# Patient Record
Sex: Female | Born: 1970
Health system: Southern US, Community
[De-identification: ages and names within clinical notes are randomized; demographics above are authoritative.]

## PROBLEM LIST (undated history)

## (undated) DIAGNOSIS — Z8042 Family history of malignant neoplasm of prostate: Secondary | ICD-10-CM

## (undated) DIAGNOSIS — C50919 Malignant neoplasm of unspecified site of unspecified female breast: Secondary | ICD-10-CM

## (undated) DIAGNOSIS — R112 Nausea with vomiting, unspecified: Secondary | ICD-10-CM

## (undated) DIAGNOSIS — T7840XA Allergy, unspecified, initial encounter: Secondary | ICD-10-CM

## (undated) DIAGNOSIS — IMO0002 Reserved for concepts with insufficient information to code with codable children: Secondary | ICD-10-CM

## (undated) DIAGNOSIS — E785 Hyperlipidemia, unspecified: Secondary | ICD-10-CM

## (undated) DIAGNOSIS — Z9221 Personal history of antineoplastic chemotherapy: Secondary | ICD-10-CM

## (undated) DIAGNOSIS — N63 Unspecified lump in unspecified breast: Secondary | ICD-10-CM

## (undated) DIAGNOSIS — Z803 Family history of malignant neoplasm of breast: Secondary | ICD-10-CM

## (undated) DIAGNOSIS — Z923 Personal history of irradiation: Secondary | ICD-10-CM

## (undated) DIAGNOSIS — I10 Essential (primary) hypertension: Secondary | ICD-10-CM

## (undated) DIAGNOSIS — I890 Lymphedema, not elsewhere classified: Secondary | ICD-10-CM

## (undated) DIAGNOSIS — I8290 Acute embolism and thrombosis of unspecified vein: Secondary | ICD-10-CM

## (undated) DIAGNOSIS — H269 Unspecified cataract: Secondary | ICD-10-CM

## (undated) DIAGNOSIS — IMO0001 Reserved for inherently not codable concepts without codable children: Secondary | ICD-10-CM

## (undated) DIAGNOSIS — R110 Nausea: Secondary | ICD-10-CM

## (undated) HISTORY — DX: Family history of malignant neoplasm of prostate: Z80.42

## (undated) HISTORY — DX: Malignant neoplasm of unspecified site of unspecified female breast: C50.919

## (undated) HISTORY — DX: Nausea: R11.0

## (undated) HISTORY — DX: Personal history of antineoplastic chemotherapy: Z92.21

## (undated) HISTORY — DX: Unspecified lump in unspecified breast: N63.0

## (undated) HISTORY — DX: Hyperlipidemia, unspecified: E78.5

## (undated) HISTORY — DX: Family history of malignant neoplasm of breast: Z80.3

## (undated) HISTORY — PX: REDUCTION MAMMAPLASTY: SUR839

---

## 1999-12-31 HISTORY — PX: TUBAL LIGATION: SHX77

## 2000-01-31 HISTORY — PX: ABDOMINAL HYSTERECTOMY: SHX81

## 2006-10-28 ENCOUNTER — Observation Stay (HOSPITAL_COMMUNITY): Admission: EM | Admit: 2006-10-28 | Discharge: 2006-10-30 | Payer: Self-pay | Admitting: Emergency Medicine

## 2006-10-28 ENCOUNTER — Ambulatory Visit: Payer: Self-pay | Admitting: Family Medicine

## 2006-10-28 LAB — CONVERTED CEMR LAB
CO2: 17 meq/L
Chloride: 105 meq/L
Total Bilirubin: 1.4 mg/dL
Total Protein: 7 g/dL

## 2006-10-29 LAB — CONVERTED CEMR LAB
Cholesterol: 99 mg/dL
Total CHOL/HDL Ratio: 2.4
Triglycerides: 54 mg/dL

## 2006-11-15 ENCOUNTER — Encounter: Payer: Self-pay | Admitting: Family Medicine

## 2006-11-15 LAB — CONVERTED CEMR LAB: TSH: 0.87 microintl units/mL

## 2006-11-16 ENCOUNTER — Encounter: Payer: Self-pay | Admitting: Family Medicine

## 2006-11-16 ENCOUNTER — Ambulatory Visit: Payer: Self-pay | Admitting: Internal Medicine

## 2006-11-16 DIAGNOSIS — E785 Hyperlipidemia, unspecified: Secondary | ICD-10-CM | POA: Insufficient documentation

## 2006-11-16 DIAGNOSIS — E1065 Type 1 diabetes mellitus with hyperglycemia: Secondary | ICD-10-CM

## 2006-11-16 DIAGNOSIS — F329 Major depressive disorder, single episode, unspecified: Secondary | ICD-10-CM

## 2006-11-16 DIAGNOSIS — E108 Type 1 diabetes mellitus with unspecified complications: Secondary | ICD-10-CM

## 2006-11-16 LAB — CONVERTED CEMR LAB: Hgb A1c MFr Bld: 10.3 %

## 2007-01-21 ENCOUNTER — Ambulatory Visit: Payer: Self-pay | Admitting: Sports Medicine

## 2007-01-21 ENCOUNTER — Encounter: Admission: RE | Admit: 2007-01-21 | Discharge: 2007-01-21 | Payer: Self-pay | Admitting: Family Medicine

## 2007-01-22 ENCOUNTER — Telehealth: Payer: Self-pay | Admitting: *Deleted

## 2007-02-13 ENCOUNTER — Encounter: Admission: RE | Admit: 2007-02-13 | Discharge: 2007-04-02 | Payer: Self-pay | Admitting: Family Medicine

## 2007-02-14 ENCOUNTER — Ambulatory Visit: Payer: Self-pay | Admitting: Family Medicine

## 2007-06-03 ENCOUNTER — Encounter: Payer: Self-pay | Admitting: Family Medicine

## 2007-06-03 LAB — CONVERTED CEMR LAB: Hgb A1c MFr Bld: 10.4 %

## 2007-06-14 ENCOUNTER — Encounter: Payer: Self-pay | Admitting: Family Medicine

## 2007-06-14 ENCOUNTER — Ambulatory Visit: Payer: Self-pay | Admitting: Family Medicine

## 2007-06-14 DIAGNOSIS — N951 Menopausal and female climacteric states: Secondary | ICD-10-CM

## 2007-06-14 LAB — CONVERTED CEMR LAB
FSH: 3.6 milliintl units/mL
TSH: 0.87 microintl units/mL (ref 0.350–5.50)

## 2007-06-17 ENCOUNTER — Encounter: Payer: Self-pay | Admitting: Family Medicine

## 2007-06-19 ENCOUNTER — Encounter: Payer: Self-pay | Admitting: Family Medicine

## 2007-07-02 ENCOUNTER — Encounter: Payer: Self-pay | Admitting: Family Medicine

## 2007-08-30 ENCOUNTER — Encounter: Payer: Self-pay | Admitting: Family Medicine

## 2007-08-30 LAB — CONVERTED CEMR LAB
ALT: 55 units/L
Free T4: 96 ng/dL
Microalb, Ur: 9 mg/dL
TSH: 2.43 microintl units/mL

## 2007-10-03 ENCOUNTER — Encounter: Payer: Self-pay | Admitting: Family Medicine

## 2007-11-27 ENCOUNTER — Ambulatory Visit: Payer: Self-pay | Admitting: Family Medicine

## 2007-12-10 ENCOUNTER — Encounter: Admission: RE | Admit: 2007-12-10 | Discharge: 2007-12-10 | Payer: Self-pay | Admitting: Family Medicine

## 2007-12-16 ENCOUNTER — Inpatient Hospital Stay (HOSPITAL_COMMUNITY): Admission: EM | Admit: 2007-12-16 | Discharge: 2007-12-18 | Payer: Self-pay | Admitting: Emergency Medicine

## 2007-12-16 ENCOUNTER — Ambulatory Visit: Payer: Self-pay | Admitting: Cardiovascular Disease

## 2007-12-16 ENCOUNTER — Encounter: Payer: Self-pay | Admitting: Family Medicine

## 2007-12-16 ENCOUNTER — Ambulatory Visit: Payer: Self-pay | Admitting: Family Medicine

## 2007-12-17 ENCOUNTER — Encounter: Payer: Self-pay | Admitting: Family Medicine

## 2007-12-17 LAB — CONVERTED CEMR LAB: Hgb A1c MFr Bld: 9 %

## 2007-12-24 ENCOUNTER — Ambulatory Visit: Payer: Self-pay | Admitting: Family Medicine

## 2007-12-24 ENCOUNTER — Encounter (INDEPENDENT_AMBULATORY_CARE_PROVIDER_SITE_OTHER): Payer: Self-pay | Admitting: *Deleted

## 2007-12-24 LAB — CONVERTED CEMR LAB: Pap Smear: 0

## 2008-01-10 ENCOUNTER — Encounter: Payer: Self-pay | Admitting: Family Medicine

## 2008-05-01 ENCOUNTER — Ambulatory Visit: Payer: Self-pay | Admitting: Family Medicine

## 2008-05-01 ENCOUNTER — Encounter: Payer: Self-pay | Admitting: Family Medicine

## 2008-05-01 LAB — CONVERTED CEMR LAB
ALT: 25 units/L (ref 0–35)
AST: 23 units/L (ref 0–37)
Alkaline Phosphatase: 75 units/L (ref 39–117)
Hgb A1c MFr Bld: 9.2 %
LDL Cholesterol: 92 mg/dL (ref 0–99)
Sodium: 141 meq/L (ref 135–145)
Total Bilirubin: 0.5 mg/dL (ref 0.3–1.2)
Total Protein: 7.3 g/dL (ref 6.0–8.3)
VLDL: 12 mg/dL (ref 0–40)

## 2008-05-21 ENCOUNTER — Telehealth: Payer: Self-pay | Admitting: Family Medicine

## 2008-05-29 ENCOUNTER — Encounter: Payer: Self-pay | Admitting: *Deleted

## 2008-07-24 ENCOUNTER — Telehealth: Payer: Self-pay | Admitting: Family Medicine

## 2008-07-27 ENCOUNTER — Ambulatory Visit: Payer: Self-pay | Admitting: Family Medicine

## 2008-07-27 LAB — CONVERTED CEMR LAB
Bilirubin Urine: NEGATIVE
Ketones, urine, test strip: NEGATIVE
Specific Gravity, Urine: 1.015

## 2008-07-28 ENCOUNTER — Other Ambulatory Visit: Admission: RE | Admit: 2008-07-28 | Discharge: 2008-07-28 | Payer: Self-pay | Admitting: Obstetrics and Gynecology

## 2008-07-28 ENCOUNTER — Ambulatory Visit: Payer: Self-pay | Admitting: Obstetrics and Gynecology

## 2008-07-28 ENCOUNTER — Encounter: Payer: Self-pay | Admitting: Obstetrics and Gynecology

## 2008-12-11 ENCOUNTER — Encounter: Admission: RE | Admit: 2008-12-11 | Discharge: 2008-12-11 | Payer: Self-pay | Admitting: Family Medicine

## 2008-12-22 ENCOUNTER — Ambulatory Visit: Payer: Self-pay | Admitting: Family Medicine

## 2008-12-22 LAB — CONVERTED CEMR LAB: Hgb A1c MFr Bld: 8.3 %

## 2009-01-30 HISTORY — PX: OTHER SURGICAL HISTORY: SHX169

## 2009-02-11 ENCOUNTER — Ambulatory Visit (HOSPITAL_BASED_OUTPATIENT_CLINIC_OR_DEPARTMENT_OTHER): Admission: RE | Admit: 2009-02-11 | Discharge: 2009-02-11 | Payer: Self-pay | Admitting: Urology

## 2009-04-30 ENCOUNTER — Telehealth: Payer: Self-pay | Admitting: Family Medicine

## 2009-04-30 ENCOUNTER — Encounter: Payer: Self-pay | Admitting: Family Medicine

## 2009-04-30 ENCOUNTER — Ambulatory Visit: Payer: Self-pay | Admitting: Family Medicine

## 2009-05-03 ENCOUNTER — Encounter: Payer: Self-pay | Admitting: Family Medicine

## 2009-05-03 LAB — CONVERTED CEMR LAB
Albumin: 4.3 g/dL (ref 3.5–5.2)
Alkaline Phosphatase: 78 units/L (ref 39–117)
BUN: 13 mg/dL (ref 6–23)
CO2: 25 meq/L (ref 19–32)
Calcium: 9.6 mg/dL (ref 8.4–10.5)
Glucose, Bld: 53 mg/dL — ABNORMAL LOW (ref 70–99)
Hemoglobin: 13.1 g/dL (ref 12.0–15.0)
Lipase: <10 units/L (ref 0–75)
MCHC: 34.3 g/dL (ref 30.0–36.0)
Potassium: 4.1 meq/L (ref 3.5–5.3)
RBC: 4.45 M/uL (ref 3.87–5.11)
Total Protein: 7.2 g/dL (ref 6.0–8.3)
WBC: 6.7 10*3/uL (ref 4.0–10.5)

## 2009-06-22 ENCOUNTER — Ambulatory Visit: Payer: Self-pay | Admitting: Family Medicine

## 2009-07-12 ENCOUNTER — Encounter: Payer: Self-pay | Admitting: Family Medicine

## 2009-11-09 ENCOUNTER — Ambulatory Visit: Payer: Self-pay | Admitting: Family Medicine

## 2009-11-09 DIAGNOSIS — M719 Bursopathy, unspecified: Secondary | ICD-10-CM

## 2009-11-09 DIAGNOSIS — M67919 Unspecified disorder of synovium and tendon, unspecified shoulder: Secondary | ICD-10-CM | POA: Insufficient documentation

## 2009-11-12 ENCOUNTER — Telehealth: Payer: Self-pay | Admitting: Family Medicine

## 2009-11-15 ENCOUNTER — Ambulatory Visit: Payer: Self-pay | Admitting: Family Medicine

## 2009-11-15 ENCOUNTER — Encounter: Admission: RE | Admit: 2009-11-15 | Discharge: 2009-11-15 | Payer: Self-pay | Admitting: Family Medicine

## 2009-12-03 ENCOUNTER — Encounter: Payer: Self-pay | Admitting: Family Medicine

## 2009-12-03 ENCOUNTER — Ambulatory Visit: Payer: Self-pay | Admitting: Family Medicine

## 2009-12-03 LAB — CONVERTED CEMR LAB: Hgb A1c MFr Bld: 8.2 %

## 2009-12-08 ENCOUNTER — Encounter
Admission: RE | Admit: 2009-12-08 | Discharge: 2010-01-25 | Payer: Self-pay | Source: Home / Self Care | Attending: Family Medicine | Admitting: Family Medicine

## 2009-12-13 ENCOUNTER — Encounter: Admission: RE | Admit: 2009-12-13 | Discharge: 2009-12-13 | Payer: Self-pay

## 2009-12-15 ENCOUNTER — Encounter: Payer: Self-pay | Admitting: Family Medicine

## 2009-12-15 ENCOUNTER — Ambulatory Visit: Payer: Self-pay | Admitting: Family Medicine

## 2009-12-15 DIAGNOSIS — N63 Unspecified lump in unspecified breast: Secondary | ICD-10-CM | POA: Insufficient documentation

## 2009-12-15 LAB — CONVERTED CEMR LAB: Direct LDL: 92 mg/dL

## 2009-12-16 ENCOUNTER — Encounter: Payer: Self-pay | Admitting: Family Medicine

## 2009-12-27 ENCOUNTER — Encounter: Admission: RE | Admit: 2009-12-27 | Discharge: 2009-12-27 | Payer: Self-pay

## 2009-12-28 ENCOUNTER — Encounter: Admission: RE | Admit: 2009-12-28 | Discharge: 2009-12-28 | Payer: Self-pay

## 2009-12-31 ENCOUNTER — Encounter: Payer: Self-pay | Admitting: Family Medicine

## 2009-12-31 ENCOUNTER — Ambulatory Visit: Payer: Self-pay | Admitting: Family Medicine

## 2010-02-01 ENCOUNTER — Encounter
Admission: RE | Admit: 2010-02-01 | Discharge: 2010-02-16 | Payer: Self-pay | Source: Home / Self Care | Attending: Family Medicine | Admitting: Family Medicine

## 2010-02-15 ENCOUNTER — Ambulatory Visit: Admission: RE | Admit: 2010-02-15 | Discharge: 2010-02-15 | Payer: Self-pay | Source: Home / Self Care

## 2010-02-15 DIAGNOSIS — M999 Biomechanical lesion, unspecified: Secondary | ICD-10-CM | POA: Insufficient documentation

## 2010-02-15 DIAGNOSIS — R29818 Other symptoms and signs involving the nervous system: Secondary | ICD-10-CM | POA: Insufficient documentation

## 2010-02-16 ENCOUNTER — Encounter: Admit: 2010-02-16 | Payer: Self-pay | Admitting: Family Medicine

## 2010-02-18 ENCOUNTER — Encounter: Payer: Self-pay | Admitting: Family Medicine

## 2010-02-22 ENCOUNTER — Encounter: Payer: Self-pay | Admitting: Family Medicine

## 2010-03-01 NOTE — Miscellaneous (Signed)
Summary: Consent: shoulder injection  Consent: shoulder injection   Imported By: Knox Royalty 12/15/2009 10:22:24  _____________________________________________________________________  External Attachment:    Type:   Image     Comment:   External Document

## 2010-03-01 NOTE — Consult Note (Signed)
Summary: Lucianne [name] MD  Lucianne [name] MD   Imported By: Bradly Bienenstock 05/04/2009 10:02:56  _____________________________________________________________________  External Attachment:    Type:   Image     Comment:   External Document

## 2010-03-01 NOTE — Assessment & Plan Note (Signed)
Summary: FU/KH   Vital Signs:  Patient profile:   40 year old female Height:      65 inches Weight:      177.8 pounds BMI:     29.69 Temp:     98.3 degrees F oral Pulse rate:   76 / minute BP sitting:   114 / 71  (right arm) Cuff size:   regular  Vitals Entered By: Garen Grams LPN (Jun 22, 2009 8:36 AM) CC: f/u nausea Is Patient Diabetic? Yes Did you bring your meter with you today? No Pain Assessment Patient in pain? no        Primary Care Provider:  [name]  MD  CC:  f/u nausea.  History of Present Illness: 1. nausea States this is improved. Had a "couple" episodes last week that have been difficult. Hasn't actually had any emesis but feeling very nauseated. Has been taking her sugars at the time of her nausea but hasn't found a correlation between the level of blood glucose and symptoms. Hasn't noted any correlation with eating. Feels fine today.   Thinks she had a normal gastric emptying study around 2003.    2. Diabetes taking medications: yes -- insulin pump followed by Dr. Lucianne [name].  problems with medications?: no blood sugar testing frequency: per symptoms and with meals otherwise.  hypoglycemic events?: occasionally -- down to 40 on Sunday, very sporadic. Sometimes has episodes once a day or several times a day, sometimes will go months without feeling it.  subjective:  ROS chest pain: no   shortness of breath: no   polyuria:  no   polydipsia: no    problems with feet: no     Habits & Providers  Alcohol-Tobacco-Diet     Tobacco Status: never  Current Medications (verified): 1)  Insulin Pump - Novolog .... Per Instructions 2)  Lipitor 40 Mg  Tabs (Atorvastatin Calcium) .... By Mouth Daily 3)  Lisinopril 10 Mg Tabs (Lisinopril) .... One By Mouth Daily For Blood Puressure 4)  Ibuprofen 800 Mg Tabs (Ibuprofen) .... One By Mouth Three Times A Day For Back Pain 5)  Neurontin 300 Mg Caps (Gabapentin) .... Take Once A Day On Day 1, Twice A Day On Day 2,  and Three Times A Day Thereafter 6)  Metformin Hcl 1000 Mg Tabs (Metformin Hcl) .... One By Mouth Two Times A Day 7)  Triamcinolone Acetonide 0.1 % Crea (Triamcinolone Acetonide) .... Apply To Affected Area Two Times A Day; Disp 30 G Tube 8)  Ondansetron 8 Mg Tbdp (Ondansetron) .... One By Mouth Every 8 Hours As Needed For Nausea/vomiting  Allergies (verified): 1)  ! * Lantus 2)  ! Lortab 10 (Hydrocodone-Acetaminophen) 3)  ! * Nph  Social History: Pt recently married (9.08) and relocated from [place]. Is on disability 2/2 to DM.  Has Medicare. No smoking, drugs, or etoh. Has 3 children aged 77, 22, and 6. Lives with these children and her husband.  very involved in her church  Review of Systems       review of systems as noted in HPI section   Physical Exam  General:  vital signs reviewed and normal Alert, appropriate; well-dressed and well-nourished  Lungs:  work of breathing unlabored, clear to auscultation bilaterally; no wheezes, rales, or ronchi; good air movement throughout  Heart:  regular rate and rhythm, no murmurs; normal s1/s2   Diabetes Management Exam:    Foot Exam (with socks and/or shoes not present):       Sensory-Pinprick/Light  touch:          Left medial foot (L-4): normal          Left dorsal foot (L-5): normal          Left lateral foot (S-1): normal          Right medial foot (L-4): normal          Right dorsal foot (L-5): normal          Right lateral foot (S-1): normal       Sensory-Monofilament:          Left foot: normal          Right foot: normal       Inspection:          Left foot: abnormal             Comments: non-tender calluses on PIP joint of toes R > L           Right foot: abnormal             Comments: non-tender calluses on PIP joint of toes R > L        Nails:          Left foot: normal          Right foot: normal   Impression & Recommendations:  Problem # 1:  NAUSEA AND VOMITING (ICD-787.01) Assessment Improved  will resend  zofran at lower dose; phenergan is too sedating for the patient. Will likely have to fill out PA. Unsure of etiology. Possibly early stages of diabetic gastroparesis but given lack of actual vomiting will defer further w/u at this time. At last visit CBC, CMP, lipase all normal.   Orders: [facility]- Est Level  3 (99213)  Problem # 2:  DM, UNCOMPLICATED, TYPE I, UNCONTROLLED (ICD-250.03) Assessment: Unchanged  brittle, followed by Dr. Lucianne [name] on insulin pump. Last A1c was 8.5 her in the office. Monofilament testing normal today.  Her updated medication list for this problem includes:    Lisinopril 10 Mg Tabs (Lisinopril) ..... One by mouth daily for blood puressure    Metformin Hcl 1000 Mg Tabs (Metformin hcl) ..... One by mouth two times a day  Orders: [facility]- Est Level  3 (16109)  Complete Medication List: 1)  Insulin Pump - Novolog  .... Per instructions 2)  Lipitor 40 Mg Tabs (Atorvastatin calcium) .... By mouth daily 3)  Lisinopril 10 Mg Tabs (Lisinopril) .... One by mouth daily for blood puressure 4)  Ibuprofen 800 Mg Tabs (Ibuprofen) .... One by mouth three times a day for back pain 5)  Neurontin 300 Mg Caps (Gabapentin) .... Take once a day on day 1, twice a day on day 2, and three times a day thereafter 6)  Metformin Hcl 1000 Mg Tabs (Metformin hcl) .... One by mouth two times a day 7)  Triamcinolone Acetonide 0.1 % Crea (Triamcinolone acetonide) .... Apply to affected area two times a day; disp 30 g tube 8)  Ondansetron 8 Mg Tbdp (Ondansetron) .... One by mouth every 8 hours as needed for nausea/vomiting  Patient Instructions: 1)  I'll send in the zofran (ondansetron).  2)  follow-up in 2-3 months 3)  we may need to repeat the gastric emptying at some point in the future.    Prevention & Chronic Care Immunizations   Influenza vaccine: Not documented    Tetanus booster: 12/24/2007: Tdap    Pneumococcal vaccine: Not documented  Other Screening   Pap smear: Marland Kitchen  (  12/24/2007)   Pap  smear due: Not Indicated    Mammogram: ASSESSMENT: Negative - BI-RADS 1^MM DIGITAL SCREENING  (12/11/2008)   Mammogram due: 12/11/2009   Smoking status: never  (06/22/2009)  Diabetes Mellitus   HgbA1C: 8.5  (04/30/2009)   Hemoglobin A1C due: 02/27/2008    Eye exam: Not documented    Foot exam: yes  (06/22/2009)   High risk foot: Not documented   Foot care education: completed  (11/27/2007)   Foot exam due: 11/26/2008    Urine microalbumin/creatinine ratio: 5  (08/30/2007)   Urine microalbumin/cr due: 08/29/2008    Diabetes flowsheet reviewed?: Yes   Progress toward A1C goal: Unchanged  Lipids   Total Cholesterol: 178  (05/01/2008)   LDL: 92  (05/01/2008)   LDL Direct: Not documented   HDL: 74  (05/01/2008)   Triglycerides: 62  (05/01/2008)    SGOT (AST): 19  (04/30/2009)   SGPT (ALT): 25  (04/30/2009)   Alkaline phosphatase: 78  (04/30/2009)   Total bilirubin: 0.6  (04/30/2009)    Lipid flowsheet reviewed?: Yes   Progress toward LDL goal: Unchanged  Self-Management Support :   Personal Goals (by the next clinic visit) :     Personal A1C goal: 8  (12/22/2008)     Personal blood pressure goal: 130/80  (12/22/2008)     Personal LDL goal: 100  (04/30/2009)    Diabetes self-management support: Not documented    Diabetes self-management support not done because: Good outcomes  (12/22/2008)   Last diabetes self-management training by diabetes educator: completed    Lipid self-management support: Not documented     Lipid self-management support not done because: Not indicated  (04/30/2009)

## 2010-03-01 NOTE — Assessment & Plan Note (Signed)
Summary: shoulder pain,tcb   Vital Signs:  Patient profile:   40 year old female Height:      65 inches Weight:      181.2 pounds BMI:     30.26 Pulse rate:   73 / minute BP sitting:   129 / 90  (left arm) Cuff size:   regular  Vitals Entered By: Garen Grams LPN (November 09, 2009 2:24 PM) CC: left shoulder pain Is Patient Diabetic? Yes Did you bring your meter with you today? No Pain Assessment Patient in pain? yes     Location: shoulder Intensity: 9   Primary Care Provider:  Priscella Mann MD  CC:  left shoulder pain.  History of Present Illness: pain started 3 months ago on left [place]oulder.  no change in exercise, no injury.  this is the side she sleeps on.  pt has some diabetic neuropathy but no new tingling in fingers.  no weakness, just hurts to move it. pain is all over, no specific place.  Habits & Providers  Alcohol-Tobacco-Diet     Tobacco Status: never  Current Medications (verified): 1)  Insulin Pump - Novolog .... Per Instructions 2)  Lipitor 40 Mg  Tabs (Atorvastatin Calcium) .... By Mouth Daily 3)  Lisinopril 10 Mg Tabs (Lisinopril) .... One By Mouth Daily For Blood Puressure 4)  Ibuprofen 800 Mg Tabs (Ibuprofen) .... One By Mouth Three Times A Day For Shoulder Pain 5)  Neurontin 300 Mg Caps (Gabapentin) .... Take Once A Day On Day 1, Twice A Day On Day 2, and Three Times A Day Thereafter 6)  Metformin Hcl 1000 Mg Tabs (Metformin Hcl) .... One By Mouth Two Times A Day 7)  Triamcinolone Acetonide 0.1 % Crea (Triamcinolone Acetonide) .... Apply To Affected Area Two Times A Day; Disp 30 G Tube 8)  Ondansetron Hcl 4 Mg Tabs (Ondansetron Hcl) .... One By Mouth Every 6 Hours As Needed 9)  Tramadol Hcl 50 Mg Tabs (Tramadol Hcl) .... Take One Every 6 Hours As Needed For Pain  Allergies (verified): 1)  ! * Lantus 2)  ! Lortab 10 (Hydrocodone-Acetaminophen) 3)  ! * Nph  Review of Systems  The patient denies anorexia, fever, and weight loss.    Physical  Exam  General:  VS reivewed.  NAD   Shoulder/Elbow Exam  General:    Well-developed, well-nourished, normal body habitus; no deformities, normal grooming.    Skin:    Intact, no scars, lesions, rashes, cafe au lait spots or bruising.    Inspection:    Inspection is normal.    Palpation:    Non-tender to palpation left shoulder.  no tenderness over spine.     Shoulder Exam:    Right:    Stability:  stable    Tenderness:  no    Swelling:  no    Erythema:  no    Inspection/Palpation:  pt with limited active ROM but normal passive ROM.     Impression & Recommendations:  Problem # 1:  SHOULDER PAIN, LEFT (ICD-719.41) Assessment New no injury, no point tenderness.  start with NSAIDS and exercises.  If not better, inject and send to PT.  emphasized that she needs to be moving and exercising to avoid frozen shoulder.  pt to RTC in 2-3 weeks Her updated medication list for this problem includes:    Ibuprofen 800 Mg Tabs (Ibuprofen) ..... One by mouth three times a day for shoulder pain    Tramadol Hcl 50 Mg Tabs (Tramadol  hcl) ..... Take one every 6 hours as needed for pain  Orders: [facility]- Est Level  3 (16109)  Complete Medication List: 1)  Insulin Pump - Novolog  .... Per instructions 2)  Lipitor 40 Mg Tabs (Atorvastatin calcium) .... By mouth daily 3)  Lisinopril 10 Mg Tabs (Lisinopril) .... One by mouth daily for blood puressure 4)  Ibuprofen 800 Mg Tabs (Ibuprofen) .... One by mouth three times a day for shoulder pain 5)  Neurontin 300 Mg Caps (Gabapentin) .... Take once a day on day 1, twice a day on day 2, and three times a day thereafter 6)  Metformin Hcl 1000 Mg Tabs (Metformin hcl) .... One by mouth two times a day 7)  Triamcinolone Acetonide 0.1 % Crea (Triamcinolone acetonide) .... Apply to affected area two times a day; disp 30 g tube 8)  Ondansetron Hcl 4 Mg Tabs (Ondansetron hcl) .... One by mouth every 6 hours as needed 9)  Tramadol Hcl 50 Mg Tabs (Tramadol hcl)  .... Take one every 6 hours as needed for pain  Patient Instructions: 1)  Come back and see me in 2-3 weeks 2)  your kidney function looks fine the last time it was checked 3)  Take 800mg  of motrin every 8 hours and take tramadol as well. 4)  1 hour after you take the pain medication, try the exercises I gave you. 5)  If this doesn't work, we will try a shoulder injection and physical therapy Prescriptions: TRAMADOL HCL 50 MG TABS (TRAMADOL HCL) take one every 6 hours as needed for pain  #45 x 1   Entered and Authorized by:   Ellery Plunk MD   Signed by:   Ellery Plunk MD on 11/09/2009   Method used:   Electronically to        [facility]  [id number]* (retail)       [address]       [place], [place]  [address]       Ph: [phone] or [phone]       Fax: [phone]   RxID:   [id number] IBUPROFEN 800 MG TABS (IBUPROFEN) one by mouth three times a day for shoulder pain  #45 x 1   Entered and Authorized by:   Ellery Plunk MD   Signed by:   Ellery Plunk MD on 11/09/2009   Method used:   Electronically to        [facility]  [id number]* (retail)       [address]       [place], [place]  [address]       Ph: [phone] or [phone]       Fax: [phone]   RxID:   [id number]

## 2010-03-01 NOTE — Progress Notes (Signed)
Summary: phn msg  Phone Note Call from Patient Call back at [facility] Phone [phone]   Caller: Patient Summary of Call: Patient unable to take Tramadol.  It makes her regurgitate.  She has stopped taking it for now.  Would like another pain rx.  Please send to [facility] on [address] Initial call taken by: Abundio Miu,  November 12, 2009 2:40 PM  Follow-up for Phone Call        pt should continue with NSAID and can alternate with tylenol.  continue exercises until appt with Dr. Madolyn Frieze Follow-up by: Ellery Plunk MD,  November 14, 2009 8:24 PM  Additional Follow-up for Phone Call Additional follow up Details #1::        pt is still having problems and needs to talk to nurse Additional Follow-up by: De Nurse,  November 15, 2009 11:27 AM    Additional Follow-up for Phone Call Additional follow up Details #2::    tramadol makes her vomit. ibu eases the  pain "some" states the shoulder pain keeps her up at night. cannot wait until 12/03/09 appt. will see Dr. Deirdre Priest this pm as he is on pcp team Follow-up by: Golden Circle RN,  November 15, 2009 11:30 AM

## 2010-03-01 NOTE — Assessment & Plan Note (Signed)
Summary: f/u rotator cuff tendonitis, HLD, mammogram results   Vital Signs:  Patient profile:   40 year old female Height:      65 inches Weight:      176.4 pounds BMI:     29.46 Temp:     98.4 degrees F oral Pulse rate:   91 / minute BP sitting:   131 / 82  (left arm) Cuff size:   regular  Vitals Entered By: Garen Grams LPN (December 31, 2009 1:37 PM) CC: f/u bp, cholesterol Is Patient Diabetic? Yes Did you bring your meter with you today? No Pain Assessment Patient in pain? yes     Location: shoulder   Primary Provider:  Priscella Mann MD  CC:  f/u bp and cholesterol.  History of Present Illness: 1. Rotator cuff tendonitis -still painful despite steroid injection  -started attending PT 11/09. Will go until 01/19/2010. Going twice a week and doing exercises at home. Finds it helpful depending on therapist. -sometimes takes up to 3 ibuprofen daily for pain; tramadol makes her vomit & sulindac causes indigestion -pt having difficulty taking off jacket in room  2. High cholesterol LDL 92 on 12/15/2009.  No longer taking statin. Endocrinologist told her she didn't need to take it any more. Patient will rather not take medicine because she has tendency to forget.  3. Breast screening Went over results of screening & ultrasound showing benign fibrocystic changes.    Preventive Screening-Counseling & Management  Alcohol-Tobacco     Smoking Status: never  Allergies: 1)  ! * Lantus 2)  ! Lortab 10 (Hydrocodone-Acetaminophen) 3)  ! * Nph  Past History:  Past Medical History: Type 1 DM dx'd in 2001. Hyperlipidemia - controlled. Cardiac Cath - 11/09 - no disease, normal LV function. R sciatica (12/2006) R rotator cuff injury, mild (05/2007) Bladder prolapse with urinary incontinence (06/2008) Shingles, mild (11/2008) L shoulder tendonitis s/p steroid injection (11/2008); PT 11/09-12/21. L breast mass found on screening mammogram (12/27/2009) 11 mm in size mass  located within the left breast at the o'clock position 8 cm from nipple, Bx benign fibrocystic changes.   h/o urinary incontinence & bladder prolapse  Physical Exam  General:  pleasant, NAD, difficulty removing jacket during interview due to shoulder pain Msk:  R shoulder: full flexion, 40 deg extension, full abduction, no pain with external or internal rotation L shoulder: full flexion, 40 deg extension, 100 deg abduction, moderate pain with external rotation & mild pain with internal rotation  Skin shows no erythema, bony abnormalities   Impression & Recommendations:  Problem # 1:  ROTATOR CUFF INJURY, LEFT SHOULDER (ICD-726.10) Assessment Improved  Commended and encouraged patient for attending PT. Will continue to go until end of this month.  Discussed with patient that this type of injury takes months, not weeks, to get better and that I am encouraged that it is improving based on improved ROM. Patient asked about further imaging. Told her not warranted at this time. May consider imaging if no improvement after PT or pain persists 6-9 months from onset. Would not consider another steroid injection at this time; may consider offering local NSAID patch in the future. Patient may use ibuprofen conservatively for pain. Cr is good. But patient aware she needs to be cautious of kidney injury.   Orders: [facility]- Est  Level 4 (10272)  Problem # 2:  HYPERLIPIDEMIA (ICD-272.4) Assessment: Unchanged  LDL 92. Patient would prefer not take statin at this time. Discussed goal is 67 based  on some standards. Am okay with holding off on statin since at least below 100.   The following medications were removed from the medication list:    Lipitor 40 Mg Tabs (Atorvastatin calcium) ..... By mouth daily  Orders: [facility]- Est  Level 4 (07371)  Problem # 3:  DM, UNCOMPLICATED, TYPE I, UNCONTROLLED (ICD-250.03) Patient reports getting eye exam done within past year. Will get records from [facility].   Her  updated medication list for this problem includes:    Lisinopril 10 Mg Tabs (Lisinopril) ..... One by mouth daily for blood puressure    Metformin Hcl 1000 Mg Tabs (Metformin hcl) ..... One by mouth two times a day  Problem # 4:  BREAST MASS, LEFT (ICD-611.72)  Discussed findings. Reassured patient benign.   Orders: [facility]- Est  Level 4 (06269)  Complete Medication List: 1)  Insulin Pump - Novolog  .... Per instructions 2)  Lisinopril 10 Mg Tabs (Lisinopril) .... One by mouth daily for blood puressure 3)  Neurontin 300 Mg Caps (Gabapentin) .... Take once a day on day 1, twice a day on day 2, and three times a day thereafter 4)  Metformin Hcl 1000 Mg Tabs (Metformin hcl) .... One by mouth two times a day 5)  Triamcinolone Acetonide 0.1 % Crea (Triamcinolone acetonide) .... Apply to affected area two times a day; disp 30 g tube 6)  Ondansetron Hcl 4 Mg Tabs (Ondansetron hcl) .... One by mouth every 6 hours as needed  Patient Instructions: 1)  No need for follow-up of shoulder. Please schedule an appointment if shoulder worsening despite physical therapy.   Orders Added: 1)  [facility]- Est  Level 4 [48546]

## 2010-03-01 NOTE — Miscellaneous (Signed)
Summary: ROI  ROI   Imported By: De Nurse 01/05/2010 16:23:19  _____________________________________________________________________  External Attachment:    Type:   Image     Comment:   External Document

## 2010-03-01 NOTE — Miscellaneous (Signed)
Summary: Labs from [facility] 11/25/2009  CMET normal A1c 8.5 Fructosamine 418 (normal 200-272) U/A, microscopic, microalbumin normal

## 2010-03-01 NOTE — Assessment & Plan Note (Signed)
Summary: OV L shoulder tendonitis   Vital Signs:  Patient profile:   40 year old female Weight:      180 pounds Temp:     98.5 degrees F oral Pulse rate:   77 / minute Pulse rhythm:   regular BP sitting:   151 / 77  (left arm) Cuff size:   regular  Vitals Entered By: Loralee Pacas CMA (December 03, 2009 3:31 PM) CC: left shoulder pai Is Patient Diabetic? Yes Did you bring your meter with you today? No   Primary Provider:  Priscella Mann MD  CC:  left shoulder pai.  History of Present Illness: 1. Persistent L shoulder pain, likely tendonitis. Today is third visit for this problem over past several weeks. Sulindac prescribed at last visit helps but causes GI discomfort. Ibuprofen does not help as much. Pain is 7/10 at worst. Pain is worst with using L shoulder. Unsure of why shoulder started hurting initially (denies trauma or other clear cause).  Pt has h/o R shoulder pain 2/2 previous job constantly lifting things. This pain is 2/10 now and has never been as bad as current L shoulder pain.   2. DM On insulin pump, followed by Dr. Lucianne [name]. Recently saw him and few changes to pump were made.  Next appt 04/2009.   3. HLD Stopped taking Lipitor. Difficulty remembering to take.  4. HTN On HCTZ. Sometimes forgets, but takes more consistently than Lipitor.    Habits & Providers  Alcohol-Tobacco-Diet     Tobacco Status: never  Exercise-Depression-Behavior     Have you felt down or hopeless? no     Have you felt little pleasure in things? no     Depression Counseling: not indicated; screening negative for depression     Seat Belt Use: always  Allergies: 1)  ! * Lantus 2)  ! Lortab 10 (Hydrocodone-Acetaminophen) 3)  ! * Nph  Social History: Risk analyst Use:  always  Physical Exam  General:  alert, well-developed, well-nourished, and well-hydrated.   Lungs:  normal respiratory effort and normal breath sounds.   Heart:  normal rate and regular rhythm.   Msk:  Pain with  passive flexion and abduction of L shoulder. Can only perform these maneuvers up to 90 degrees. No tenderness. Muscle tone normal.  R shoulder full ROM.  Extremities:  No edema bilaterally.   Diabetes Management Exam:    Foot Exam (with socks and/or shoes not present):       Sensory-Pinprick/Light touch:          Left medial foot (L-4): normal          Left dorsal foot (L-5): normal          Left lateral foot (S-1): normal          Right medial foot (L-4): normal          Right dorsal foot (L-5): normal          Right lateral foot (S-1): normal       Inspection:          Left foot: normal          Right foot: normal       Nails:          Left foot: normal          Right foot: normal   Impression & Recommendations:  Problem # 1:  SHOULDER PAIN, LEFT (ICD-719.41) Likely tendonitis of rotator cuff muscles. Injected L subacromial bursa with  4:1 lidocaine: kenalog. Pt reported immediate improvement. Able to achieve almost full ROM of L shoulder. Will also refer to PT to strengthen rotator cuff muscles. No need to f/u if pain improved.   Her updated medication list for this problem includes:    Tramadol Hcl 50 Mg Tabs (Tramadol hcl) .Marland Kitchen... Take one every 6 hours as needed for pain    Sulindac 200 Mg Tabs (Sulindac) .Marland Kitchen... 1 by mouth two times a day with food for 10 days  Orders: [facility]- Est Level  3 (10626) Injection, intermediate joint - [facility] (94854) Physical Therapy Referral (PT)  Problem # 2:  HYPERLIPIDEMIA (ICD-272.4) Will check LDL at next visit. Pt noncompliant with meds.   Her updated medication list for this problem includes:    Lipitor 40 Mg Tabs (Atorvastatin calcium) ..... By mouth daily  Problem # 3:  DM, UNCOMPLICATED, TYPE I, UNCONTROLLED (ICD-250.03) Pump managed by Dr. Lucianne [name]. A1c slightly elevated than before and still not ideal. Will request note from Dr. Remus Blake office so I am aware of recent changes and his recs.   Her updated medication list for this problem  includes:    Lisinopril 10 Mg Tabs (Lisinopril) ..... One by mouth daily for blood puressure    Metformin Hcl 1000 Mg Tabs (Metformin hcl) ..... One by mouth two times a day  Orders: A1C-[facility] (62703)  Complete Medication List: 1)  Insulin Pump - Novolog  .... Per instructions 2)  Lipitor 40 Mg Tabs (Atorvastatin calcium) .... By mouth daily 3)  Lisinopril 10 Mg Tabs (Lisinopril) .... One by mouth daily for blood puressure 4)  Neurontin 300 Mg Caps (Gabapentin) .... Take once a day on day 1, twice a day on day 2, and three times a day thereafter 5)  Metformin Hcl 1000 Mg Tabs (Metformin hcl) .... One by mouth two times a day 6)  Triamcinolone Acetonide 0.1 % Crea (Triamcinolone acetonide) .... Apply to affected area two times a day; disp 30 g tube 7)  Ondansetron Hcl 4 Mg Tabs (Ondansetron hcl) .... One by mouth every 6 hours as needed 8)  Tramadol Hcl 50 Mg Tabs (Tramadol hcl) .... Take one every 6 hours as needed for pain 9)  Sulindac 200 Mg Tabs (Sulindac) .Marland Kitchen.. 1 by mouth two times a day with food for 10 days   Orders Added: 1)  A1C-[facility] [83036] 2)  [facility]- Est Level  3 [99213] 3)  Injection, intermediate joint - [facility] [20605] 4)  Physical Therapy Referral [PT]    Laboratory Results   Blood Tests   Date/Time Received: December 03, 2009 3:39 PM  Date/Time Reported: December 03, 2009 3:46 PM   HGBA1C: 8.2%   (Normal Range: Non-Diabetic - 3-6%   Control Diabetic - 6-8%)  Comments: ...............test performed by............Marland KitchenLoralee Pacas, CMA .............entered by...........Marland KitchenBonnie A. [place], MLS (ASCP)cm

## 2010-03-01 NOTE — Assessment & Plan Note (Signed)
Summary: nauseated,tcb   Vital Signs:  Patient profile:   40 year old female Height:      65 inches Weight:      176.1 pounds BMI:     29.41 Temp:     98.2 degrees F oral Pulse rate:   80 / minute BP sitting:   125 / 80  (left arm) Cuff size:   regular  Vitals Entered By: Garen Grams LPN (April 30, 452 8:52 AM) CC: nausea/vomiting x 1 week Is Patient Diabetic? Yes Did you bring your meter with you today? No Pain Assessment Patient in pain? no        Primary Care Provider:  [name]  MD  CC:  nausea/vomiting x 1 week.  History of Present Illness: 1. nauseated x 1 week Nausea started on Monday and has been persistent. Worse in the morning and early afternoon. Took a phenergan from her brother which helped. Yesterday didn't feel bad. Feels a little nauseated today. Has vomited about 3 times per day except yesterday and today. Emesis is watery to resembling her food. No bile or blood. No abdominal pain  fevers:   no  chills: no    nausea: yes    vomiting: yes    diarrhea: no     chest pain:   no  shortness of breath: no     2. Diabetes taking medications: yes problems with medications?: yes blood sugar testing frequency: about 3 x per day.  hypoglycemic events?: has had a few numbers in the 40s subjective: sugars are mostly in the 100s to 200s at home. States that Dr. Lucianne [name] took her off lipitor.   Past history: bladder sling surger 1/11 per urology.    Habits & Providers  Alcohol-Tobacco-Diet     Tobacco Status: never  Current Medications (verified): 1)  Insulin Pump - Novolog .... Per Instructions 2)  Lipitor 40 Mg  Tabs (Atorvastatin Calcium) .... By Mouth Daily 3)  Lisinopril 10 Mg Tabs (Lisinopril) .... One By Mouth Daily For Blood Puressure 4)  Ibuprofen 800 Mg Tabs (Ibuprofen) .... One By Mouth Three Times A Day For Back Pain 5)  Neurontin 300 Mg Caps (Gabapentin) .... Take Once A Day On Day 1, Twice A Day On Day 2, and Three Times A Day  Thereafter 6)  Metformin Hcl 1000 Mg Tabs (Metformin Hcl) .... One By Mouth Two Times A Day 7)  Triamcinolone Acetonide 0.1 % Crea (Triamcinolone Acetonide) .... Apply To Affected Area Two Times A Day; Disp 30 G Tube 8)  Hydroxyzine Hcl 25 Mg Tabs (Hydroxyzine Hcl) .... Take One By Mouth Every 6 Hours As Needed For Itching  Allergies (verified): 1)  ! * Lantus 2)  ! Lortab 10 (Hydrocodone-Acetaminophen) 3)  ! * Nph  Past History:  Past Surgical History: s/p C/S, BTL, hysterectomy 2003  bladder sling sugery -- Dr. McDiarmid at [facility].   Review of Systems       review of systems as noted in HPI section   Physical Exam  General:  vital signs reviewed and normal Alert, appropriate; well-dressed and well-nourished  Mouth:  oropharynx pink, moist; no erythema or exudate  Lungs:  work of breathing unlabored, clear to auscultation bilaterally; no wheezes, rales, or ronchi; good air movement throughout  Heart:  regular rate and rhythm, no murmurs; normal s1/s2  Abdomen:  +BS, soft, non-tender, non-distended; no masses; no rebound or guarding; insulin pump insertion site clean, dry, and intact  Pulses:  2+ DP and radial  pulses Extremities:  no cyanosis, clubbing, or edema    Impression & Recommendations:  Problem # 1:  NAUSEA AND VOMITING (ICD-787.01) Assessment New Nausea and vomiting. A few hypoglycemic episodes, however pt hasn't checked her sugars with her symptoms. Asked her to do this. Will check labs below. Sugar 115 in office and pt feels mildly nauseated. Possibly simple prolonged viral gastroenteritis. Zofran as needed. Return parameters discussed.  Patient agreeable. See instructions   Orders: Comp Met-[facility] [phone]) CBC-[facility] (86578) Lipase-[facility] ([id number]) [facility]- Est  Level 4 (13244)  Problem # 2:  DM, UNCOMPLICATED, TYPE I, UNCONTROLLED (ICD-250.03) Assessment: Unchanged check a1c today (consistent with previous but not at goal). Diabetes managed by Dr. Lucianne [name].  Asked pt to have him send [place] a copy of her upcoming visit with him. She is agreeable.  Her updated medication list for this problem includes:    Lisinopril 10 Mg Tabs (Lisinopril) ..... One by mouth daily for blood puressure    Metformin Hcl 1000 Mg Tabs (Metformin hcl) ..... One by mouth two times a day  Orders: Glucose Cap-[facility] (01027) A1C-[facility] (25366) [facility]- Est  Level 4 (44034)  Complete Medication List: 1)  Insulin Pump - Novolog  .... Per instructions 2)  Lipitor 40 Mg Tabs (Atorvastatin calcium) .... By mouth daily 3)  Lisinopril 10 Mg Tabs (Lisinopril) .... One by mouth daily for blood puressure 4)  Ibuprofen 800 Mg Tabs (Ibuprofen) .... One by mouth three times a day for back pain 5)  Neurontin 300 Mg Caps (Gabapentin) .... Take once a day on day 1, twice a day on day 2, and three times a day thereafter 6)  Metformin Hcl 1000 Mg Tabs (Metformin hcl) .... One by mouth two times a day 7)  Triamcinolone Acetonide 0.1 % Crea (Triamcinolone acetonide) .... Apply to affected area two times a day; disp 30 g tube 8)  Ondansetron 8 Mg Tbdp (Ondansetron) .... One by mouth every 8 hours as needed for nausea/vomiting  Patient Instructions: 1)  drink plenty of fluids 2)  check you sugars when you are nauseated 3)  if this gets worse, please call. 4)  if you sugars are pesistently low, call Dr. Emelda Brothers office for recommendations with your pump 5)  follow-up in 1-2 months Prescriptions: ONDANSETRON 8 MG TBDP (ONDANSETRON) one by mouth every 8 hours as needed for nausea/vomiting  #15 x 0   Entered and Authorized by:   Terri Soman  MD   Signed by:   Terri Soman  MD on 04/30/2009   Method used:   Electronically to        [facility]  [phone]* (retail)       [address]       [place], [place]  [address]       Ph: [phone] or [phone]       Fax: [phone]   RxID:   [id number]   Laboratory Results   Blood Tests   Date/Time Received:  April 30, 2009 9:14 AM  Date/Time Reported: April 30, 2009 10:01 AM   HGBA1C: 8.5%   (Normal Range: Non-Diabetic - 3-6%   Control Diabetic - 6-8%)  Comments: ...........test performed by...........Marland KitchenTerese Door, CMA      Prevention & Chronic Care Immunizations   Influenza vaccine: Not documented    Tetanus booster: 12/24/2007: Tdap    Pneumococcal vaccine: Not documented  Other Screening   Pap smear: .  (12/24/2007)   Pap smear due:  Not Indicated    Mammogram: ASSESSMENT: Negative - BI-RADS 1^MM DIGITAL SCREENING  (12/11/2008)   Mammogram due: 12/11/2009   Smoking status: never  (04/30/2009)  Diabetes Mellitus   HgbA1C: 8.5  (04/30/2009)   Hemoglobin A1C due: 02/27/2008    Eye exam: Not documented    Foot exam: normal  (11/27/2007)   High risk foot: Not documented   Foot care education: completed  (11/27/2007)   Foot exam due: 11/26/2008    Urine microalbumin/creatinine ratio: 5  (08/30/2007)   Urine microalbumin/cr due: 08/29/2008    Diabetes flowsheet reviewed?: Yes   Progress toward A1C goal: Unchanged  Lipids   Total Cholesterol: 178  (05/01/2008)   LDL: 92  (05/01/2008)   LDL Direct: Not documented   HDL: 74  (05/01/2008)   Triglycerides: 62  (05/01/2008)    SGOT (AST): 23  (05/01/2008)   SGPT (ALT): 25  (05/01/2008) CMP ordered    Alkaline phosphatase: 75  (05/01/2008)   Total bilirubin: 0.5  (05/01/2008)    Lipid flowsheet reviewed?: Yes   Progress toward LDL goal: Unchanged  Self-Management Support :   Personal Goals (by the next clinic visit) :     Personal A1C goal: 8  (12/22/2008)     Personal blood pressure goal: 130/80  (12/22/2008)     Personal LDL goal: 100  (04/30/2009)    Diabetes self-management support: Not documented    Diabetes self-management support not done because: Good outcomes  (12/22/2008)   Last diabetes self-management training by diabetes educator: completed    Lipid self-management support: Not documented      Lipid self-management support not done because: Not indicated  (04/30/2009)

## 2010-03-01 NOTE — Progress Notes (Signed)
Summary: Rx Problem  Phone Note Call from Patient Call back at Home Phone [phone]   Caller: Patient Summary of Call: Pt saw Dr. Rexene Alberts today and the medication she was perscribed the pharmacy needs prior authurization.  The pharmacy is [facility]. Initial call taken by: Clydell Hakim,  April 30, 2009 4:00 PM  Follow-up for Phone Call        LM Follow-up by: Golden Circle RN,  May 03, 2009 10:02 AM  Additional Follow-up for Phone Call Additional follow up Details #1::        Kennon Rounds -- I called pt to discuss her lab results -- her nausea is improved so we don't need to worry about this medication. Thanks. Additional Follow-up by: [name]  MD,  May 03, 2009 10:35 AM

## 2010-03-01 NOTE — Miscellaneous (Signed)
Summary: chart update  Clinical Lists Changes  Problems: Removed problem of NAUSEA AND VOMITING (ICD-787.01) Removed problem of SHINGLES (ICD-053.9) Removed problem of ROTATOR CUFF SPRAIN AND STRAIN (ICD-840.4) Removed problem of BACK PAIN, LUMBAR (ICD-724.2) Removed problem of GYNECOLOGICAL EXAMINATION, ROUTINE (ICD-V72.31) Observations: Added new observation of PRIMARY MD: [name]  MD (07/12/2009 10:47)

## 2010-03-01 NOTE — Assessment & Plan Note (Signed)
Summary: shoulder pain/[place]   Vital Signs:  Patient profile:   40 year old female Height:      65 inches Weight:      178.6 pounds BMI:     29.83 Temp:     99.2 degrees F oral Pulse rate:   86 / minute BP sitting:   131 / 90  (left arm) Cuff size:   regular  Vitals Entered By: Garen Grams LPN (November 15, 2009 3:53 PM) CC: still having shoulder pain, tramadol made her sick Is Patient Diabetic? No Pain Assessment Patient in pain? yes     Location: left shoulder   Primary Care Provider:  Priscella Mann MD  CC:  still having shoulder pain and tramadol made her sick.  History of Present Illness: Shoulder Pain Per Dr Melina Modena Note "pain started 3 months ago on left [place]oulder.  no change in exercise, no injury.  this is the side she sleeps on.  no weakness, just hurts to move it. pain is all over, no specific place." Pt confirms above.  Tramadol made her very nauseous.  Has been using ibuprofen intermittently which helps some. Pain is similar but worse than her chronic RIGHT shoulder pain.  No other painful or swollen joints or rashes.  ROS - as above PMH - Medications reviewed and updated in medication list.  Smoking Status noted in VS form Pt is diabetic with pump and fair control      Habits & Providers  Alcohol-Tobacco-Diet     Tobacco Status: never  Current Medications (verified): 1)  Insulin Pump - Novolog .... Per Instructions 2)  Lipitor 40 Mg  Tabs (Atorvastatin Calcium) .... By Mouth Daily 3)  Lisinopril 10 Mg Tabs (Lisinopril) .... One By Mouth Daily For Blood Puressure 4)  Neurontin 300 Mg Caps (Gabapentin) .... Take Once A Day On Day 1, Twice A Day On Day 2, and Three Times A Day Thereafter 5)  Metformin Hcl 1000 Mg Tabs (Metformin Hcl) .... One By Mouth Two Times A Day 6)  Triamcinolone Acetonide 0.1 % Crea (Triamcinolone Acetonide) .... Apply To Affected Area Two Times A Day; Disp 30 G Tube 7)  Ondansetron Hcl 4 Mg Tabs (Ondansetron Hcl) .... One  By Mouth Every 6 Hours As Needed 8)  Tramadol Hcl 50 Mg Tabs (Tramadol Hcl) .... Take One Every 6 Hours As Needed For Pain 9)  Sulindac 200 Mg Tabs (Sulindac) .Marland Kitchen.. 1 By Mouth Two Times A Day With Food For 10 Days  Allergies: 1)  ! * Lantus 2)  ! Lortab 10 (Hydrocodone-Acetaminophen) 3)  ! * Nph  Physical Exam  General:  Well-developed,well-nourished,in no acute distress; alert,appropriate and cooperative throughout examination Msk:  Left Shoulder pain at extremes of motion can not fully internally rotate.  Pain with rotator cuff testing but no weakness.  Distal pulses and elbow wrist normal.  Diffusely tender to palpation without focality    Impression & Recommendations:  Problem # 1:  SHOULDER PAIN, LEFT (ICD-719.41) Assessment Deteriorated Seems most consistent with tendonitis.  Will check xray since is so persistent to rule out joint and mass cause and try short course of nsaids (do not want to use longer due to diabetes and possible renal impariment) If persists would rec PT.   Steroid injection only as last resort given her diabetes  The following medications were removed from the medication list:    Ibuprofen 800 Mg Tabs (Ibuprofen) ..... One by mouth three times a day for shoulder pain Her  updated medication list for this problem includes:    Tramadol Hcl 50 Mg Tabs (Tramadol hcl) .Marland Kitchen... Take one every 6 hours as needed for pain    Sulindac 200 Mg Tabs (Sulindac) .Marland Kitchen... 1 by mouth two times a day with food for 10 days  Orders: Diagnostic X-Ray/Fluoroscopy (Diagnostic X-Ray/Flu) [facility]- Est  Level 4 (16109)  Complete Medication List: 1)  Insulin Pump - Novolog  .... Per instructions 2)  Lipitor 40 Mg Tabs (Atorvastatin calcium) .... By mouth daily 3)  Lisinopril 10 Mg Tabs (Lisinopril) .... One by mouth daily for blood puressure 4)  Neurontin 300 Mg Caps (Gabapentin) .... Take once a day on day 1, twice a day on day 2, and three times a day thereafter 5)  Metformin Hcl 1000 Mg  Tabs (Metformin hcl) .... One by mouth two times a day 6)  Triamcinolone Acetonide 0.1 % Crea (Triamcinolone acetonide) .... Apply to affected area two times a day; disp 30 g tube 7)  Ondansetron Hcl 4 Mg Tabs (Ondansetron hcl) .... One by mouth every 6 hours as needed 8)  Tramadol Hcl 50 Mg Tabs (Tramadol hcl) .... Take one every 6 hours as needed for pain 9)  Sulindac 200 Mg Tabs (Sulindac) .Marland Kitchen.. 1 by mouth two times a day with food for 10 days  Other Orders: Flu Vaccine 67yrs + (60454) Admin 1st Vaccine (09811)  Patient Instructions: 1)  We will notify your about the xray 2)  Take the sulindac two times a day reguarly for 10 days and then as needed 3)  If the pain is no better in 8-9 days then call us and we will refer you to PT 4)  Use heat on your shoulder when it is painful Prescriptions: SULINDAC 200 MG TABS (SULINDAC) 1 by mouth two times a day with food for 10 days  #20 x 1   Entered and Authorized by:   Pearlean Brownie MD   Signed by:   Pearlean Brownie MD on 11/15/2009   Method used:   Electronically to        [facility]  [phone]* (retail)       [address]       [place], [place]  [address]       Ph: [phone] or [phone]       Fax: [phone]   RxID:   [id number]    Orders Added: 1)  Diagnostic X-Ray/Fluoroscopy [Diagnostic X-Ray/Flu] 2)  Flu Vaccine 75yrs + [38756] 3)  Admin 1st Vaccine [90471] 4)  [facility]- Est  Level 4 [43329]   Immunizations Administered:  Influenza Vaccine # 1:    Vaccine Type: Fluvax 3+    Site: right deltoid    Mfr: GlaxoSmithKline    Dose: 0.5 ml    Route: IM    Given by: Garen Grams LPN    Exp. Date: 07/27/2010    Lot #: [id number]    VIS given: 08/24/09 version given November 15, 2009.  Flu Vaccine Consent Questions:    Do you have a history of severe allergic reactions to this vaccine? no    Any prior history of allergic reactions to egg and/or gelatin? no    Do you have a  sensitivity to the preservative Thimersol? no    Do you have a past history of Guillan-Barre Syndrome? no    Do you currently have an acute febrile illness? no    Have you ever had a severe reaction  to latex? no    Vaccine information given and explained to patient? yes    Are you currently pregnant? no   Immunizations Administered:  Influenza Vaccine # 1:    Vaccine Type: Fluvax 3+    Site: right deltoid    Mfr: GlaxoSmithKline    Dose: 0.5 ml    Route: IM    Given by: Garen Grams LPN    Exp. Date: 07/27/2010    Lot #: [id number]    VIS given: 08/24/09 version given November 15, 2009.

## 2010-03-03 NOTE — Miscellaneous (Signed)
Summary: Outside clinic diabetic eye exam  Clinical Lists Changes  Observations: Added new observation of DMEYEEXAMNXT: 07/2010 (02/18/2010 14:17) Added new observation of PRIMARY MD: Priscella Mann MD (02/18/2010 14:17) Added new observation of DMEYEEXMRES: mild diabetic retinopathy bilatearlly, unchanged from 12/30/2008 (07/06/2009 14:19) Added new observation of EYE EXAM BY: Dr. Diana Eves @ [facility]  (07/06/2009 14:19) Added new observation of DIAB EYE EX: mild diabetic retinopathy bilatearlly, unchanged from 12/30/2008 (07/06/2009 14:19)       Diabetes Management Exam:    Eye Exam:       Eye Exam done elsewhere          Date: 07/06/2009          Results: mild diabetic retinopathy bilatearlly, unchanged from 12/30/2008          Done by: Dr. Diana Eves @ [facility]

## 2010-03-03 NOTE — Miscellaneous (Signed)
Summary: Finish L shoulder PT      Past History:  Past Medical History: Type 1 DM dx'd in 2001. Hyperlipidemia - controlled. Cardiac Cath - 11/09 - no disease, normal LV function. R sciatica (12/2006) R rotator cuff injury, mild (05/2007) Bladder prolapse with urinary incontinence (06/2008) Shingles, mild (11/2008)  L shoulder tendonitis:   -s/p steroid injection (11/2008)   -finished PT 11/2009-01/2010. Final report: L shoulder: flexion 135/160, abduction 120/140, extension 50/60, ER 50/62, IR 40/52; strength 4-5+. But still c significant difficulty: dressing, reaching into cabinet, picking up purse.   L breast mass found on screening mammogram (12/27/2009) 11 mm in size mass located within the left breast at the o'clock position 8 cm from nipple, Bx benign fibrocystic changes.   h/o urinary incontinence & bladder prolapse

## 2010-03-03 NOTE — Assessment & Plan Note (Signed)
Summary: shoulder not better,OMT   Vital Signs:  Patient profile:   40 year old female Weight:      178.06 pounds BMI:     29.74 BSA:     1.88 Temp:     98.4 degrees F Pulse rate:   88 / minute BP sitting:   132 / 82  Vitals Entered By: Jone Baseman CMA (February 15, 2010 9:15 AM) CC: left shoulder still no better Is Patient Diabetic? Yes Did you bring your meter with you today? No Pain Assessment Patient in pain? yes     Location: left shoulder Intensity: 6   Primary Care Provider:  Priscella Mann MD  CC:  left shoulder still no better.  History of Present Illness: 40 yo femla e here for f/u on her shoulder pain.  Pt has had left shoulder pain for some time now.  Was told it was bursitis.  Pt has been going to PT with a little improvement at first and then it seemed to get worse. Pt states that she has toruble intiating movement in any direction due to pain and is finding it haerd doing all of her ADL's such as putting on clothes or snapping her bra on.   Pt deneis any wekaness or numbness but the pain in certain movments does shoot down her arm to her elbow.   Pt states she does not want to be on painkillers for the rest of her life and would like to know what else there should be done.     Pt is accompainied by husband who is asking for a possible MRI.  Pt has not taken any medication due to the fact that she is a diabetic and is nervous about her kidneys.   Habits & Providers  Alcohol-Tobacco-Diet     Tobacco Status: never  Current Medications (verified): 1)  Insulin Pump - Novolog .... Per Instructions 2)  Lisinopril 10 Mg Tabs (Lisinopril) .... One By Mouth Daily For Blood Puressure 3)  Neurontin 300 Mg Caps (Gabapentin) .... Take Once A Day On Day 1, Twice A Day On Day 2, and Three Times A Day Thereafter 4)  Metformin Hcl 1000 Mg Tabs (Metformin Hcl) .... One By Mouth Two Times A Day 5)  Triamcinolone Acetonide 0.1 % Crea (Triamcinolone Acetonide) .... Apply To  Affected Area Two Times A Day; Disp 30 G Tube 6)  Ondansetron Hcl 4 Mg Tabs (Ondansetron Hcl) .... One By Mouth Every 6 Hours As Needed  Allergies (verified): 1)  ! * Lantus 2)  ! Lortab 10 (Hydrocodone-Acetaminophen) 3)  ! * Nph  Past History:  Past medical, surgical, family and social histories (including risk factors) reviewed, and no changes noted (except as noted below).  Past Medical History: Reviewed history from 12/31/2009 and no changes required. Type 1 DM dx'd in 2001. Hyperlipidemia - controlled. Cardiac Cath - 11/09 - no disease, normal LV function. R sciatica (12/2006) R rotator cuff injury, mild (05/2007) Bladder prolapse with urinary incontinence (06/2008) Shingles, mild (11/2008) L shoulder tendonitis s/p steroid injection (11/2008); PT 11/09-12/21. L breast mass found on screening mammogram (12/27/2009) 11 mm in size mass located within the left breast at the o'clock position 8 cm from nipple, Bx benign fibrocystic changes.   h/o urinary incontinence & bladder prolapse  Past Surgical History: Reviewed history from 04/30/2009 and no changes required. s/p C/S, BTL, hysterectomy 2003  bladder sling sugery -- Dr. McDiarmid at [facility].   Family History: Reviewed history from 11/27/2007 and no  changes required. Father HTN and h/o DVT Mother has HTN and h/o hepatitis. Sister has DM type 2. Brother with kidney dz s/p transplant. MA died with breast cancer diagnosis age 53 MGF died of prostate cancer MU died of unspecified sinus? cancer  Social History: Reviewed history from 06/22/2009 and no changes required. Pt recently married (9.08) and relocated from [place]. Is on disability 2/2 to DM.  Has Medicare. No smoking, drugs, or etoh. Has 3 children aged 50, 7, and 6. Lives with these children and her husband.  very involved in her church  Review of Systems       denies fever, chills, nausea, vomiting, diarrhea or constipation see hpi for rest  Physical  Exam  General:  pleasant, NAD, difficulty removing jacket during interview due to shoulder pain Eyes:  PERRL Mouth:  MMM Neck:  supple, mild hypertonicty of scalenes on left.  Lungs:  normal respiratory effort and normal breath sounds.   Msk:  left shoulder, full strength, full passive ROM decrease active ROM in internal rotation and abduction, NVI peripherally  +Hawkins, + neer Negative empty can  OMT findings elevated 1st rib on left T5RSright T7RS left  Sacrum Left on LEft.  Extremities:  No edema bilaterally.    Impression & Recommendations:  Problem # 1:  ROTATOR CUFF INJURY, LEFT SHOULDER (ICD-726.10) Pt is showing some nerve entrapment but seems to be due to MSK findings.  Pt did have manipulation after consent, with much improvement in symptoms,  Appears to be due to deconditioning of upper back muscles and maybe core instabilty. Gave pt exercises to due and strength traing.  Reiterated it would be good for her DM as well. Told pt to return in 2-3 weeks   Orders: [facility]- Est Level  3 (16109)  Problem # 2:  THORACIC SOMATIC DYSFUNCTION (ICD-739.2) OMT with much improvbemnt HVLA.  Orders: [facility]- Est Level  3 (99213) OMT 1-2 [facility] (60454)  Problem # 3:  SOMATIC DYSFUNCTION, RIB CAGE (ICD-781.99) OMT [facility] technique with improvement Orders: [facility]- Est Level  3 (99213) OMT 1-2 [facility] (09811)  Complete Medication List: 1)  Insulin Pump - Novolog  .... Per instructions 2)  Lisinopril 10 Mg Tabs (Lisinopril) .... One by mouth daily for blood puressure 3)  Neurontin 300 Mg Caps (Gabapentin) .... Take once a day on day 1, twice a day on day 2, and three times a day thereafter 4)  Metformin Hcl 1000 Mg Tabs (Metformin hcl) .... One by mouth two times a day 5)  Triamcinolone Acetonide 0.1 % Crea (Triamcinolone acetonide) .... Apply to affected area two times a day; disp 30 g tube 6)  Ondansetron Hcl 4 Mg Tabs (Ondansetron hcl) .... One by mouth every 6 hours as  needed  Patient Instructions: 1)  Nice to meet you 2)  For your shoulder we need to strenthen the upper back and the core 3)  Do three sets of 10 two times a day of all the exercises 4)  1.  On all four lift left hand and right leg hold for 2 seconds do opposite side next. 5)  2. Soup cans or 2 pound weights- 45 degree angle pull arms back trying to have the shoulder blades touch and then hold for 2 seconds then allow to relax 6)  3.  Standing strainght up lift can out in front and hold for two seconds then relax. 7)  Make an appointment with me in 2-3 weeks for another round of manipulation 8)  Motrin 600mg  three times a day for the next 3 days    Orders Added: 1)  [facility]- Est Level  3 [91478] 2)  OMT 1-2 [facility] [29562]

## 2010-03-09 ENCOUNTER — Encounter: Payer: Self-pay | Admitting: Family Medicine

## 2010-03-10 ENCOUNTER — Encounter: Payer: Self-pay | Admitting: Family Medicine

## 2010-03-10 ENCOUNTER — Ambulatory Visit (INDEPENDENT_AMBULATORY_CARE_PROVIDER_SITE_OTHER): Payer: BC Managed Care – PPO | Admitting: Family Medicine

## 2010-03-10 VITALS — BP 140/92 | HR 108 | Temp 98.7°F | Ht 65.0 in | Wt 171.6 lb

## 2010-03-10 DIAGNOSIS — M67919 Unspecified disorder of synovium and tendon, unspecified shoulder: Secondary | ICD-10-CM

## 2010-03-10 DIAGNOSIS — M222X9 Patellofemoral disorders, unspecified knee: Secondary | ICD-10-CM | POA: Insufficient documentation

## 2010-03-10 DIAGNOSIS — E1065 Type 1 diabetes mellitus with hyperglycemia: Secondary | ICD-10-CM

## 2010-03-10 DIAGNOSIS — M25569 Pain in unspecified knee: Secondary | ICD-10-CM

## 2010-03-10 NOTE — Assessment & Plan Note (Addendum)
Pt injected today with improvement in symptoms, pt though now has had 2 shots in the last 5 months.  Pt has had PT done as well without benefits.  Will send to ortho Dewaine Conger) and have evaluation.  Pt given exercises to do to help strengthen back and shoulder girdle.  Neurontin at night for neuropathy.

## 2010-03-10 NOTE — Assessment & Plan Note (Signed)
Gave number for [name] to see if pt would like, also called Dr. Remus Blake office to get most recent A1c and Chem sent over.

## 2010-03-10 NOTE — Progress Notes (Signed)
  Subjective:    Patient ID: [name], female    DOB: 1970-06-11, 40 y.o.   MRN: [id number]  HPI  Pt is here for  1.  Shoulder pain- Pt states that the pain did get a little better with manipulation better last time but only a little bit.  Pt states it came back within days still having the pain.  Pt has done a total of 12 weeks of Pt without any help.  Pt has not had much imaging done but  Pain is affecting her activities of daily living And would consider surgery if needed. Left shoulder no problem with right shoulder.   2.  Diabetes-  Pt is seen by an endocrinologist Dr. Lucianne [name] but does not have the best relationship.  Pt does have an insulin pump and feels more comfortable with an endocrinologist.   3.  Knee pain-  Right knee, pt states for the last 2 months seems to be getting worse. Pt does not remember an injury but does remember it started when she was carrying groceries up the stairs. Pt states the pain occurs after exercise or walking a lot, no radiation no swelling, no erythema.  Pt has used Alieve with some help.    Review of Systems  Denies fever chills nausea, vomiting, diarrhea, or constipation. No other joints seem to be giving her problems.      Objective:   Physical Exam     General Appearance:    Alert, cooperative, no distress, appears stated age  Head:    Normocephalic, without obvious abnormality, atraumatic  Eyes:    PERRL, conjunctiva/corneas clear, EOM's intact,   Ears:    Normal TM's and external ear canals, both ears     Throat:   Lips, mucosa, and tongue normal; teeth and gums normal     Back:     Symmetric, no curvature, ROM normal, no CVA tenderness patient does hold shoulders anteriorly at rest  Lungs:     Clear to auscultation bilaterally, respirations unlabored  Chest Wall:    No tenderness or deformity   Heart:    Regular rate and rhythm, S1 and S2 normal, no murmur, rub   or gallop     Abdomen:     Soft, non-tender, bowel sounds active all four  quadrants,    no masses, no organomegaly        Extremities:   Left shoulder-  Decrease active range of motion, passive range of motion both due to pain, no gross deformity.  No swelling, no crepitus,  +hawkins, +neers, + empty can, -speed's test Right knee and left knee ligaments intact  Both have patellofemoral syndrome with VMO weakness, no chondromalacia palpated or crepitus, no swelling no redness. Negative bounce test and McMurry.   Pulses:   2+ and symmetric all extremities              Procedure note After verbal and written consent given pt was prepped with betadine.  1:3 kenalog 40 to lidocaine used in left shoulder.  Pt minimal bleeding dressed with band aid, Pt given red flags to look for pt had better pain control immediatly.    Assessment & Plan:

## 2010-03-10 NOTE — Patient Instructions (Signed)
Good to see you For your knee try doing the exercises I showed you.  Do them 3 sets two times daily Wear better shoes! I want to see you in 3-4 weeks if your knee is not better I want you to try to bicycle as well is you can 3-4 times a week For your shoulder I will do a shot today  I will have you see a orthopaedic surgeon.  We will wait and let them choose the correct imaging.  Continue the back exercises to help.

## 2010-03-14 ENCOUNTER — Telehealth: Payer: Self-pay | Admitting: Family Medicine

## 2010-03-14 NOTE — Telephone Encounter (Signed)
Appt scheduled for 03/17/10 at 915am with Dr. Farris Has at [facility]. Patient informed.

## 2010-03-27 ENCOUNTER — Encounter: Payer: Self-pay | Admitting: Family Medicine

## 2010-03-27 DIAGNOSIS — M67919 Unspecified disorder of synovium and tendon, unspecified shoulder: Secondary | ICD-10-CM

## 2010-03-27 DIAGNOSIS — E1065 Type 1 diabetes mellitus with hyperglycemia: Secondary | ICD-10-CM

## 2010-03-27 DIAGNOSIS — M222X9 Patellofemoral disorders, unspecified knee: Secondary | ICD-10-CM

## 2010-03-27 NOTE — Progress Notes (Signed)
  Subjective:    Patient ID: [name], female    DOB: 11-15-1970, 40 y.o.   MRN: [id number]  HPI    Review of Systems     Objective:   Physical Exam        Assessment & Plan:

## 2010-03-27 NOTE — Assessment & Plan Note (Signed)
Seen by Dr. Farris Has @ [facility] on 03/17/2010: educated and reassured. PT discussed but patient does not want at this time. Home VMO strengthening program & intermittent functional patellofemoral bracing.

## 2010-03-27 NOTE — Assessment & Plan Note (Signed)
Chronic recalcitrant left shoulder rotator cuff tendonitis (now symptoms for about 6 months)  Patient seen by Dr. Farris Has @ [facility] on 02/16: patient considered to have failed conservative measures. Will obtain MRI.

## 2010-04-17 LAB — APTT: aPTT: 29 seconds (ref 24–37)

## 2010-04-17 LAB — CBC
Hemoglobin: 13.3 g/dL (ref 12.0–15.0)
MCHC: 34.4 g/dL (ref 30.0–36.0)
RBC: 4.44 MIL/uL (ref 3.87–5.11)
WBC: 6.4 10*3/uL (ref 4.0–10.5)

## 2010-04-17 LAB — BASIC METABOLIC PANEL
BUN: 15 mg/dL (ref 6–23)
Calcium: 9.2 mg/dL (ref 8.4–10.5)
Creatinine, Ser: 0.89 mg/dL (ref 0.4–1.2)
GFR calc non Af Amer: >60 mL/min (ref >60)
Glucose, Bld: 210 mg/dL — ABNORMAL HIGH (ref 70–99)
Potassium: 4.1 mEq/L (ref 3.5–5.1)

## 2010-04-17 LAB — PROTIME-INR
INR: 1.03 (ref 0.00–1.49)
Prothrombin Time: 13.4 seconds (ref 11.6–15.2)

## 2010-04-17 LAB — TYPE AND SCREEN: Antibody Screen: NEGATIVE

## 2010-04-20 LAB — GLUCOSE, CAPILLARY: Glucose-Capillary: 115 mg/dL — ABNORMAL HIGH (ref 70–99)

## 2010-04-26 HISTORY — PX: SHOULDER SURGERY: SHX246

## 2010-05-24 ENCOUNTER — Emergency Department (HOSPITAL_COMMUNITY)
Admission: EM | Admit: 2010-05-24 | Discharge: 2010-05-25 | Disposition: A | Discharge status: Home / Self Care | Payer: Medicare Other | Attending: Emergency Medicine | Admitting: Emergency Medicine

## 2010-05-24 DIAGNOSIS — J3489 Other specified disorders of nose and nasal sinuses: Secondary | ICD-10-CM | POA: Insufficient documentation

## 2010-05-24 DIAGNOSIS — R070 Pain in throat: Secondary | ICD-10-CM | POA: Insufficient documentation

## 2010-05-24 DIAGNOSIS — R059 Cough, unspecified: Secondary | ICD-10-CM | POA: Insufficient documentation

## 2010-05-24 DIAGNOSIS — E785 Hyperlipidemia, unspecified: Secondary | ICD-10-CM | POA: Insufficient documentation

## 2010-05-24 DIAGNOSIS — R6889 Other general symptoms and signs: Secondary | ICD-10-CM | POA: Insufficient documentation

## 2010-05-24 DIAGNOSIS — Z794 Long term (current) use of insulin: Secondary | ICD-10-CM | POA: Insufficient documentation

## 2010-05-24 DIAGNOSIS — F3289 Other specified depressive episodes: Secondary | ICD-10-CM | POA: Insufficient documentation

## 2010-05-24 DIAGNOSIS — J069 Acute upper respiratory infection, unspecified: Secondary | ICD-10-CM | POA: Insufficient documentation

## 2010-05-24 DIAGNOSIS — F329 Major depressive disorder, single episode, unspecified: Secondary | ICD-10-CM | POA: Insufficient documentation

## 2010-05-24 DIAGNOSIS — R05 Cough: Secondary | ICD-10-CM | POA: Insufficient documentation

## 2010-05-24 DIAGNOSIS — E119 Type 2 diabetes mellitus without complications: Secondary | ICD-10-CM | POA: Insufficient documentation

## 2010-05-27 ENCOUNTER — Inpatient Hospital Stay (INDEPENDENT_AMBULATORY_CARE_PROVIDER_SITE_OTHER)
Admission: RE | Admit: 2010-05-27 | Discharge: 2010-05-27 | Disposition: A | Discharge status: Home / Self Care | Payer: BC Managed Care – PPO | Source: Ambulatory Visit

## 2010-05-27 ENCOUNTER — Telehealth: Payer: Self-pay | Admitting: *Deleted

## 2010-05-27 DIAGNOSIS — J040 Acute laryngitis: Secondary | ICD-10-CM

## 2010-05-27 DIAGNOSIS — H6690 Otitis media, unspecified, unspecified ear: Secondary | ICD-10-CM

## 2010-05-27 NOTE — Telephone Encounter (Signed)
She walked in & left a walk in form asking for an appt later today. She then left. I called her because we have no appts left for today. She is c/o ear pain & hoarseness. whe will go to UC. States she had been to ED for this Tuesday  but feels worse

## 2010-06-14 NOTE — Cardiovascular Report (Signed)
[name], [name]               ACCOUNT NO.:  [id number]   MEDICAL RECORD NO.:  [id number]          PATIENT TYPE:  INP   LOCATION:  3307                         FACILITY:  [facility]   PHYSICIAN:  Verne Carrow, MDDATE OF BIRTH:  01-09-1971   DATE OF PROCEDURE:  12/18/2007  DATE OF DISCHARGE:  12/18/2007                            CARDIAC CATHETERIZATION   PRIMARY CARE PHYSICIAN:  [name], MD   PRIMARY CARDIOLOGIST:  None.   PROCEDURES PERFORMED:  1. Left heart catheterization.  2. Selective coronary angiography.  3. Left ventricular angiogram.   OPERATOR:  Verne Carrow, MD   INDICATIONS:  Atypical chest pain in a 40 year old African American  female with past medical history significant for insulin-dependent  diabetes mellitus, hyperlipidemia, and obesity who was admitted with  diabetic ketoacidosis and in this setting of a metabolic disorder had a  mild elevation of her troponin level.   PROCEDURE IN DETAIL:  The patient was brought to the inpatient cardiac  catheterization laboratory after signing informed consent for the  procedure.  The right groin was prepped and draped in a sterile fashion.  Lidocaine 1% was used for local anesthesia.  A 5-French sheath was  inserted into the right femoral artery without difficulty.  A 5-French  JL-4 diagnostic catheter was used to selectively engage and inject the  left coronary system.  No torque right catheter was used to selectively  engage and inject the right coronary artery.  A 5-French pigtail  catheter was used across the aortic valve into the left ventricle.  Following the performance of the left ventricular angiogram, this  catheter was pulled back across the aortic valve with no significant  pressure gradient measured.  The patient tolerated the procedure well  and was taken to the recovery area in stable condition.   ANGIOGRAPHIC FINDINGS:  1. The left main coronary artery bifurcated into the LAD and  the      circumflex.  There was no angiographic evidence of disease in the      left main system.  2. The left anterior descending is a large vessel that courses to the      apex and becomes rather small around the apex.  There are 2      diagonal branches that have no evidence of disease.  There is no      evidence of obstructive disease throughout the entire LAD.  3. The circumflex artery is a large dominant artery that gives off an      early obtuse marginal branch and then posterior descending branch      as well.  There is no evidence of disease in this system.  4. The right coronary artery is a small nondominant vessel that has no      evidence of disease.  5. Left ventricular angiogram demonstrates normal systolic function of      the left ventricle with no wall motion abnormalities.  Ejection      fraction is 65%.   HEMODYNAMIC DATA:  Left ventricular pressure 115/4, end-diastolic  pressure 13, and central aortic pressure 106/63.   IMPRESSION:  1.  No angiographic evidence of coronary artery disease.  2. Normal left ventricular systolic function without wall motion      abnormalities.   RECOMMENDATIONS:  No further cardiac workup is indicated at the current  time.      Verne Carrow, MD  Electronically Signed     CM/MEDQ  D:  12/18/2007  T:  12/18/2007  Job:  [id number]   cc:   [name], MD

## 2010-06-14 NOTE — Discharge Summary (Signed)
NAMESERRINA, [name]               ACCOUNT NO.:  [id number]   MEDICAL RECORD NO.:  [id number]          PATIENT TYPE:  INP   LOCATION:  3307                         FACILITY:  [facility]   PHYSICIAN:  Leighton Roach McDiarmid, M.D.DATE OF BIRTH:  1970/06/26   DATE OF ADMISSION:  12/16/2007  DATE OF DISCHARGE:  12/18/2007                               DISCHARGE SUMMARY   ADDENDUM:  Cardiac catheterization showed no evidence of coronary artery  disease and left ventricular function greater than 60% and no further  cardiac workup was recommended.  At this time, the patient's blood  chemistries showed closure of her anion gap and therefore resolution of  her diabetic ketoacidosis.  Therefore, the patient was discharged with  her insulin pump without any oral diabetic medications.  Prior to the  discharge, the patient met with a diabetic consultation nurse regarding  management of her insulin pump to prevent any further complications  arising from use of her pump.  At the time of her discharge, the patient  was tolerating regular, carbohydrate modified diet without difficulty,  and denied any return of her abdominal pain, nausea or vomiting and was  in agreement with being discharged home with close followup.   DISCHARGE MEDICATIONS:  1. Neurontin 300 mg p.o. t.i.d.  2. Lipitor 40 mg p.o. daily.  3. Lisinopril 5 mg p.o. daily.  4. Ibuprofen p.r.n.  5. Aspirin 81 mg daily.  6. NovoLog insulin pump programming guide.  Basal rates ranging from      1.2 to 1.8 depending on time of the day.   DISCHARGE INSTRUCTIONS:  1. The patient is discharged to home and is to take medications as      mentioned above.  2. The patient's followup with primary care physician, Terri Soman,      MD, primary care, [facility] on Tuesday, December 26, 2007 at 10 a.m.  3. The patient is to followup with her endocrinologist, Dr. [name], at      [facility] on December 25, 2007.  4. The  patient is to continue to change her insulin administration      site every 3 days.  5. The patient is to continue to monitor her blood sugar readings, if      they become elevated, she is advised to change her insulin pump      tubing cartridge and make sure that they are functioning properly.  6. The patient is to return to the emergency department for treatment      of care for persistently high blood sugars, return of headaches,      return of abdominal pain, nausea, vomiting or other systemic      symptoms.   DISCHARGE CONDITIONS:  The patient was discharged home in stable medical  condition.      Terri Soman, MD  Electronically Signed      Leighton Roach McDiarmid, M.D.  Electronically Signed    TE/MEDQ  D:  12/18/2007  T:  12/19/2007  Job:  [id number]

## 2010-06-14 NOTE — H&P (Signed)
[name], [name]               ACCOUNT NO.:  [id number]   MEDICAL RECORD NO.:  [id number]          PATIENT TYPE:  INP   LOCATION:  3307                         FACILITY:  [facility]   PHYSICIAN:  Leighton Roach McDiarmid, M.D.DATE OF BIRTH:  [birth year]   DATE OF ADMISSION:  12/16/2007  DATE OF DISCHARGE:                              HISTORY & PHYSICAL   PRIORITY ADMISSION HISTORY AND PHYSICAL   REASON FOR HOSPITALIZATION:  Diabetic ketoacidosis.   PRIMARY CARE Kaleisha Bhargava:  Myself, [name], MD, at [facility].   HISTORY OF PRESENT ILLNESS:  The patient is a 40 year old female with  difficult to control type 1 diabetes diagnosed in 2001, on an insulin  pump, who presents to the emergency department today with a report of  increased blood sugars at home and feeling like she might be in DKA.  Per her report and per my past meetings with her, the patient's blood  sugars are indeed difficult to control with sugars ranging mostly in the  200s to 300s.  The patient reports that these have been her numbers over  the past several days except for last night when she had sugars up into  the 500s.  She bolused herself several times with her insulin pump with  failure to appreciably decrease her blood sugars.  She also had the  onset of polydipsia and polyuria and also with nausea.  The patient has  had no vomiting.  She felt worse this morning so she came to the  emergency department.  The patient also reports an increased heart rate  and an achy feeling all over her chest.  She does feel somewhat short  of breath.  Of note, the patient states that she started feeling a  little bit sick on Saturday morning, this was 2 days ago, with a sore  throat, a nonproductive cough, and a temperature to 99.6.  She also had  a runny nose, says any symptoms have abated, but the runny nose does  still persist. She denied any lower extremity swelling.  She says that  she is taking oral intake  but does have a poor appetite.  She does not  have any heat or cold intolerance.  She has not seen her Endocrine  doctor, Dr. Delaney Meigs, for awhile, but states that she has an appointment  with him on November 26th.   ALLERGIES:  1. LANTUS AND NPH.  The patient states that both of these medicines      cause her to have itching and swelling and also cause her to have      difficulty breathing.  2. LORTAB.   PAST MEDICAL HISTORY:  Significant for:  1. Type 1 diabetes diagnosed in 2001.  2. Mild hyperlipidemia.  3. Sciatica.   PAST SURGICAL HISTORY:  1. Status post a C-section.  2. Bilateral tubal ligation.  3. Hysterectomy in 2003.   FAMILY HISTORY:  Significant for hypertension and history of DVT in her  father, mother with a history of hypertension and hepatitis, a sister  with type 2 diabetes, and a brother with kidney disease,  who is status  post transplant, a maternal aunt died of breast cancer, which was  diagnosed at age 1, paternal grandfather died of prostate cancer, and  maternal uncle died of an unspecified cancer.   SOCIAL HISTORY:  The patient is fairly recently married to [employer].  She has relocated here from [place] and has her primary care  at [facility].  She is on disability secondary to her  diabetes and has Medicare.  The patient does not smoke, use drugs, or  alcohol.  She has 3 children approximately ages 41, 72, and 10.  She lives  with these children and her husband.   REVIEW OF SYSTEMS:  As per HPI.   MEDICATIONS:  1. Lipitor 40 mg p.o. daily.  2. Lisinopril 5 mg p.o. daily.  3. Ibuprofen p.r.n.  4. Neurontin 300 mg 3 times daily.   PHYSICAL EXAMINATION:  VITAL SIGNS:  Temperature 98.0, blood pressure  142/93, heart rate 100, respiratory rate 22 to 24, O2 SATs are 100% on  room air.  GENERAL:  In no acute distress, she is alert and appropriate and  oriented with exam.  HEENT:  Extraocular muscles are intact, pupils are  equal, round, and  reactive to light, sclerae are clear.  Oropharynx is pink and moist and  non-dehydrated.  CARDIOVASCULAR:  The patient's heart rate is tachy to about 100 beats  per minute, she has no murmur, rhythm is regular, normal S1 and S2.  LUNGS:  Work of breathing is unlabored, there are no Kussmaul  respirations noted, she is not tachypneic to my exam, she is clear to  auscultation bilaterally, no wheezes or rales or rhonchi.  ABDOMEN:  Hypoactive, but soft, nontender, and nondistended, no masses  are palpated.  EXTREMITIES:  Hands and feet are warm, 2+ dorsalis pedis and radial  pulses, hands and feet are well perfused.   LABS AND STUDIES:  EKG performed in the emergency department shows sinus  tachycardia; otherwise, within normal limits.  At the time of our  interview with the patient, all we have back is the basic metabolic  panel that shows sodium of 135, potassium 5.0, chloride 101, CO2 16.  This leaves her with an anion gap of 18.  Glucose is 431, BUN is 20,  creatinine is 1.15, calcium 9.8.  Urinalysis shows a specific gravity of  1.028 with greater than 1000 glucose and greater than 80 ketones.  It is  otherwise within normal limits.   ASSESSMENT AND PLAN:  The patient is a 40 year old African American  female with hard to control diabetes and mild diabetic ketoacidosis.  1. Diabetic ketoacidosis:  Her anion gap upon our interview in the      emergency department was 18.  We will accordingly transfer the      patient to stepdown unit for initiation of insulin drip and further      stabilization.  Her mental status is clear at this time.  Her      sodium of 135 corrects to 141 accounting for her glucose.  We will      start half-normal saline at 300 mL an hour and add Dextrose when      her has glucose decreased to less than 250.  We will check a BMET      every 2 hours and follow her potassium.  We will replete with 20      mEq in her intravenous fluids when her  potassium falls below 5.0.  We will check a urinalysis every 4 hours until her ketones are      negative x2, and we will follow her clinically and check her sugars      q.1 hour per the insulin protocol and diabetic ketoacidosis order      set.  2. Type 2 diabetes:  Her last A1c back in September I believe was 8.3.      We will ask diabetes management to examine her insulin pump.  Her      current pump basilar rates are as follows:  12 a.m. 1.4 units per      hour; 5 a.m. 2.0 units per hour; 10 a.m. 1.5 units per hour; 12      p.m. 2.2 units per hour; 3 p.m. 2.5 units per hour; 10 p.m. 1.4      units  per hour.  We will follow her sugars for now and reinstitute      her insulin pump when she is off the drip assuming that it works      correctly.  The patient is ALLERGIC TO NPH AND LANTUS so sliding      scale insulin with a basal rate dosing with long-acting insulin is      not feasible at this time.  We will check a fasting lipid panel,      A1c, and start aspirin 81 mg p.o. daily.  3. Fluids, electrolytes, nutrition/gastrointestinal:  We will start      half-normal saline at 300 mL an hour and add D5 as already      discussed when sugars are below 250.  We will add 20 mEq of      potassium as already discussed.  We will allow the patient to eat      as we were actively lowering her glucose and the patient is      currently stable, and carbohydrate-induced hyperglycemia is not the      issue at this point as much as a lipolysis.  4. Prophylaxis:  Lovenox subcutaneously, prophylactic dosing proton      pump inhibitor.  5. Tachycardia and chest pain:  The patient does note some tachycardia      and some chest achiness.  Her electrocardiogram looks reassuring in      the emergency department.  However, given her diabetes, we will      obtain 2 sets of cardiac enzymes and repeat an electrocardiogram in      the morning.  Fasting lipids as already discussed keeping in mind      that her  lipid panel was not very poor recently.  6. Disposition:  Admit to stepdown unit for now.  Hopefully, she will      transfer to a regular bed tomorrow morning with better control of      her sugars.      [name], MD  Electronically Signed      Leighton Roach McDiarmid, M.D.  Electronically Signed    TE/MEDQ  D:  12/16/2007  T:  12/16/2007  Job:  [id number]

## 2010-06-14 NOTE — Discharge Summary (Signed)
NAMEPORFIRIA, [name]               ACCOUNT NO.:  [id number]   MEDICAL RECORD NO.:  [id number]          PATIENT TYPE:  INP   LOCATION:  3307                         FACILITY:  [facility]   PHYSICIAN:  Leighton Roach McDiarmid, M.D.DATE OF BIRTH:  14-Oct-1970   DATE OF ADMISSION:  12/16/2007  DATE OF DISCHARGE:                               DISCHARGE SUMMARY   PRIMARY CARE PHYSICIAN:  [name], MD   ADMISSION DIAGNOSIS:  Mild-to-moderate diabetic ketoacidosis.   DISCHARGE DIAGNOSIS:  Status post resolution of mild-to-moderate  diabetic ketoacidosis.   HISTORY OF PRESENT ILLNESS:  The patient is a 40 year old female with  difficult to control type 1 diabetes diagnosed in 2001, on an insulin  pump, who presents to the emergency department with the report of  increased blood sugars and had a feeling like she might be in DKA.  Per  her report and per Dr. Danford Bad past meetings with her, the patient's  blood sugars are indeed difficult to control.  Blood sugars are ranging  mostly in the 200s and 300s.  The patient reports that these have been  her numbers over the past several days except for night previously when  she had sugars up in the 500s.  She bolused herself several times with  her insulin pump with values appreciably decrease of blood sugars.  She  also had the onset of polydipsia and polyuria and also nausea.  The  patient has had no vomiting, she felt worse the morning of admission, so  she came to the emergency department.  The patient also reported  increased heart rate and what she described as an achy feeling all over  her chest.  She does feel somewhat short of breath.  Of note, the  patient states that she started feeling a little bit sick on Saturday  morning, which should have been 2 days before admission.  She described  a sore throat, nonproductive cough, and temperature of 99.6 degrees.  She also had runny nose and states that all symptoms have abated but the  runny  nose does still persist.  She denied any lower extremity swelling.  She states that she is taking oral intake, but does have a poor  appetite.  She denies any heat or cold intolerance and she has not seen  her endocrine doctor, Dr. Lucianne [name], for a while but states that she does  have an appointment with him on December 25, 2007.   PROCEDURE:  The patient received a cardiac catheterization on December 18, 2007.   CONSULTATIONS:  None.   HOSPITAL COURSE:  The patient was admitted to the [facility] and placed on intravenous fluid therapy with D5, half  normal saline 300 mL an hour plus 20 mEq of KCl with an insulin drip  with 10 units per hour.  Serial CBGs and metabolic panels were drawn  after admission.  By the morning after admission, the patient seen a  slow correction in her blood sugar readings from a high of 431 down to  360.  Her blood sugar levels continued to drop to the 250s;  however, in  the morning on December 17, 2007, she had elevated troponins  registered at 0.14 and 0.19.  At that time, the Cardiology is consulted.  The Cardiology recommended a Stress Myoview versus cardiac  catheterization.  After speaking with the patient, Cardiology decided to  proceed with the catheterization on the morning on the December 18, 2007.      [name], MD  Electronically Signed      Leighton Roach McDiarmid, M.D.  Electronically Signed    TE/MEDQ  D:  12/18/2007  T:  12/19/2007  Job:  [id number]

## 2010-06-14 NOTE — H&P (Signed)
NAMEJANITH, NIELSON               ACCOUNT NO.:  [id number]   MEDICAL RECORD NO.:  [id number]          PATIENT TYPE:  OBV   LOCATION:  1823                         FACILITY:  [facility]   PHYSICIAN:  Pearlean Brownie, M.D.DATE OF BIRTH:  [birth year]   DATE OF ADMISSION:  10/28/2006  DATE OF DISCHARGE:                              HISTORY & PHYSICAL   CHIEF COMPLAINT:  Diabetic ketoacidosis.   HISTORY OF PRESENT ILLNESS:  This is a 40 year old African American  female, with diabetes, although she is unsure if it is type 1 or type 2,  that was diagnosed in 2001, who has recently moved from [place], who  presents to the emergency department with complaint of nausea, headache,  and high blood sugar readings.  The patient states that she has been  getting high blood sugar readings over the past week and has started  having stomach aches and nausea over the past 1 to 2 days.  The patient  also reports polyuria and polydipsia over this time.  She denies any  recent illnesses, infections, sick contacts, or other identifiable  causes of her high blood sugar readings.  The patient last saw her  endocrinologist in late August.  His name is Dr. Artist Pais in  [place] with [facility].  The patient states that her  last hemoglobin A1c was about 10 per her report.  She has not found an  endocrinologist in the area since moving.  The patient reports that she  has a insulin pump and she has had this one pump over the past year and  thinks that it is working just fine.  The patient reports that over the  past week with the high blood sugar readings she has been self treating  with boluses of NovoLog insulin via her pump at about 12 units a time  with no success in decreasing her blood sugars.  The patient reports  that the readings have been off the meter and this makes them greater  than 600 over the past week.  The patient reports that her last  hospitalization for the diabetic  ketoacidosis was in July of 2007.  She  reports that she has been in the intensive care unit secondary to her  DKA episodes in the past.  She reports that this would be the least  sick that she has been for hospital admission.  The patient denies any  vomiting.  She does report some nonexertional chest pain and abdominal  pain that comes and goes.  The patient reports that she has been taking  Metformin for the last 5 to 6 months, 500 mg p.o. b.i.d.  The patient  and husband report that over this time period of symptoms she has  maintained a normal mental status with no decreased level of  consciousness.   ALLERGIES:  1. LANTUS.  2. NPH.  NUMBERS 1 AND 2 CAUSE THE PATIENT TO HAVE SWELLING AND ITCHING.  1. LORTAB.   MEDICATIONS:  1. Insulin pump with NovoLog insulin.  2. Metformin 500 mg p.o. b.i.d.  3. Lipitor 40 mg p.o. daily.  4. Vitamin  B.  5. Reglan p.r.n., however, the patient reports that she does not take      this medicine because it makes her feel poorly.   PAST MEDICAL HISTORY:  1. Diabetes diagnosed in 2001.  However, the patient reports if she is      not sure if it is type 1 or type 2; it depends on the doctor that      you ask, according to her.  2. Hyperlipidemia.   PAST SURGICAL HISTORY:  The patient is:  1. Status post cesarean section.  2. Status post bilateral tubal ligation.  3. Status post hysterectomy.   FAMILY HISTORY:  Father has hypertension and history of deep vein  thrombosis.  Mother has hypertension and history of hepatitis.  Sister  has type 2 diabetes and the patient's brother has kidney disease and is  status post a kidney transplant.  The patient does not know the specific  form of kidney disease however.   SOCIAL HISTORY:  The patient was recently married on October 06, 2006  and recently moved to [place] actually with her husband.  She is  moving from [place], [place].  The patient has 3 children, ages 83, 34, and  50, who live with her  currently.  The patient denies history of smoking,  alcohol use, or illegal drugs.  The patient is married to her husband  Viviann Sp[employer].  His phone number is [phone] and his cell phone  number is [phone].   REVIEW OF SYSTEMS:  As per HPI.   PHYSICAL EXAMINATION:  VITAL SIGNS:  Temperature is 97, blood pressure  is 113/75, heart rate is 104, respiratory rate is 18, oxygen saturation  is 100% on room air.  GENERAL:  The patient is alert, oriented, pleasant, and answers  questions appropriately.  HEENT:  Extraocular muscles are intact.  Pupils are equal, round, and  reactive to light. Oropharynx is pink and moist without erythema or  exudate with no signs of dehydration on the mucous membranes.  However,  the patient is status post at least 1 liter of fluid in the emergency  department.  NECK:  Soft and nontender.  No thyromegaly.  No cervical lymphadenopathy  palpated.  CARDIOVASCULAR:  Regular rate and rhythm.  No murmurs, gallops, or rubs.  Normal S1 and S2.  LUNGS:  Work of breathing is unlabored and regular.  Clear to  auscultation bilaterally.  No wheezes, rales, or rhonchi.  No Kussmaul  respirations noted.  ABDOMINAL:  Positive bowel sounds.  Soft, nontender, nondistended.  No  rebound or guarding.  EXTREMITIES:  There are 2+ dorsalis pedis and radial pulses.  Warm and  well perfused.  NEUROLOGICAL:  The patient is alert and oriented, appropriate, and  ambulates without difficulty.  Sensation is grossly intact in bilateral  lower extremities.   LABORATORIES:  Sodium of 134, potassium of 4.5, chloride of 106, bicarb  of 14.9, BUN of 11, creatinine of 0.7, sugar 332.  Anion gap is 13.  CBC  shows a white blood cell count of 8.1, hemoglobin of 15, hematocrit of  44, and a platelet count of 279, differential shows 59% neutrophils.  Urinalysis shows a specific gravity of 1.034, glucose greater than 100,  ketones greater than 80, and is otherwise negative.  Repeat  CBG  performed approximately an hour prior to examination was 249 after 1  liter of fluids and 10 units of insulin.   ASSESSMENT/PLAN:  This is a 40 year old [place] American female with  diabetes mellitus.  She is uncertain if this is type 1 or type 2.  However, it is most likely that this is type 2, given the patient's  symptoms and multiple admissions for diabetic ketoacidosis.  She reports  with an increased blood sugar over the past week and nausea and headache  over the last couple of days.  1. Diabetic ketoacidosis.  Last CBG approximately an hour prior to      examination was at 249, decreased from 332 on initial metabolic      panel, anion gap at 13.  We will repeat this metabolic panel now as      the anion gap has likely closed.  Nonetheless, we will start      insulin drip at 5 units an hour and give D5 half normal saline with      30 mEq of KCL at 250 mL/h.  ABG is not indicated at this time,      given the patient's stable status, already lowering blood sugar      readings, and relatively mild increased anion gap.  We will check      CBGs every 2 hours and monitor for response with goal decrease in      blood sugars by 50 to 70 per hour.  We will also supplement the      patient's IV fluids with potassium, given that the BMET potassium      was 4.5 and the patient is likely total body volume depleted      secondary to her acidosis.  The patient is currently afebrile and      has no leukocytosis.  We will obtain an EKG, given the patient's      complaint of chest pain and to monitor for any signs of silent      ischemia.  The patient has already received at least 1 bolus of      normal saline and 10 units of insulin in the ED.  The patient is      currently stable.  We will place the patient in 23-hour      observation.  We will obtain hemoglobin A1c in the a.m.  The      patient's initial creatinine was 0.7, however, we will hold the      patient's ACE inhibitor at this  time.  2. Hyperlipidemia.  We will obtain a fasting lipid panel in the      morning and continue the patient's outpatient dose of Lipitor 40 mg      daily.  3. Disposition.  We will work to set the patient up with an      endocrinologist for followup locally.  The patient also needs a      primary care physician and we will discuss this further with the      patient.  4. FENGI:  D5 half normal saline plus 30 mEq of KCL at 250 mL/h.  We      will check a sugar on the next metabolic panel and if it remains      lower than 250, we will allow the patient to have a carbohydrate      modified diet.      [name], MD  Electronically Signed      Pearlean Brownie, M.D.  Electronically Signed    TE/MEDQ  D:  10/28/2006  T:  10/28/2006  Job:  [id number]

## 2010-06-14 NOTE — Discharge Summary (Signed)
NAMEEDITH, [name]               ACCOUNT NO.:  [id number]   MEDICAL RECORD NO.:  [id number]          PATIENT TYPE:  OBV   LOCATION:  5735                         FACILITY:  [facility]   PHYSICIAN:  Janace Litten           DATE OF BIRTH:  17-Nov-1970   DATE OF ADMISSION:  10/28/2006  DATE OF DISCHARGE:  10/30/2006                               DISCHARGE SUMMARY   ADMISSION DIAGNOSIS:  Mild to moderate diabetic ketoacidosis.   DISCHARGE DIAGNOSIS:  Status post resolution of mild to moderate  diabetic ketoacidosis.   BRIEF HISTORY OF PRESENT ILLNESS:  Briefly, this is a 40 year old  African-American female with diabetes mellitus although she is unsure  whether it is type 1 or type 2.  Given her lab values and current  medication regimen, we can assume that she is a type 1 diabetic with  some insulin resistance.  The patient reported to the ED after noticing  that her blood sugars had been elevated over the past approximately 5 to  6 days.  One to two days prior to presentation to the ED, the patient  noticed that she was having increased abdominal pain and headache and  thought that she might be going into diabetic ketoacidosis.  The patient  noted that several of her blood sugar readings had been off her meter  which reads up to 600.  The patient presented to the ED with an initial  blood sugar of 332 and anion gap of 13 with venous blood gas of 7.30.  Initial bicarbonate level was 14.9.  Accordingly, the patient met  criteria for mild to moderate diabetic ketoacidosis and was brought into  the hospital for stabilization of blood sugars and resolution of her  DKA.   PROCEDURE:  None.   CONSULTATIONS:  Diabetes management.   HOSPITAL COURSE:  The patient was admitted to the [facility]  Teaching Service and placed on intravenous fluid therapy of D5 1/2  normal saline and 250 mL an hour with an insulin drip of 10 units an  hour.  Serial CBGs and metabolic panels were drawn after  admission.  By  the morning after admission the patient had seen a slow correction in  her blood sugar readings from a high of 332 to the 140s.  The morning  after admission, the patient actually had an over correction and her  blood sugar reading was all the way down to a low of 75.  Accordingly,  the patient's insulin drip was stopped and the patient was placed back  on her insulin pump.  By the morning after hospital admission on  September 29, the patient's anion gap had closed from 14.9 to 4.  After  resumption of the patient's home insulin pump, her blood sugars were  noted to rise again to the mid 200s.  We monitored the patient on her  insulin pump for several hours.  It was determined that the patient  should change her IV tubing, her cartridge and her overall insulin pump  set up.  This was done with little improvement in her  blood sugars.  They remained high at 240.  Therefore, the patient was kept an  additional night for further management of her blood sugar reading and  monitoring for going back into ketoacidosis.  On hospital day #2 the  patient's bicarbonate level was noted to be 27 with a chloride of 110.  The patient also had a sodium of 141, therefore the patient's anion gap  continued to be closed.  It should be noted that the patient was on  metformin as an outpatient 500 mg p.o. b.i.d.  This medication was not  restarted on this admission and could for at least some of the elevation  in her blood sugars, given that the patient showed no signs of falling  back into diabetic ketoacidosis, she was discharged with her insulin  pump and resumption of her metformin 500 mg p.o. b.i.d.  The patient was  set up for close outpatient followup on October 2, two days after the  date of discharge so that she could be plugged in with an  endocrinologist locally and have close monitoring of her insulin pump  usage and also have access to teaching for her diabetes management.  At   the time of discharge, the patient was tolerating regular carbohydrate  modified diet without difficulty and had not had return of her abdominal  pain or headache and was in agreement with being discharged to home with  close followup.   SIGNIFICANT FINDINGS:  I-STAT electrolytes on admission showed a sodium  of 134, potassium of 4.5, chloride of 106, bicarb of 14.9 and anion gap  of 13.  Initial I-STAT glucose was read at 332.  Initial serum  creatinine on I-STAT was 0.7 as well.  CBC on admission showed a white  blood cell count of 8.1, hemoglobin of 15 and a platelet count of 279.  Urinalysis performed on admission showed a greater than 1000 of glucose  and greater than 8 ketones and was otherwise negative.  Urine pregnancy  test performed on admission was negative as well.  Repeat metabolic  panel on the date of discharge showed a sodium of 141, potassium of 3.8,  BUN of 3, creatinine of 0.79, chloride of 110 and CO2 of 27 with an  anion gap of 4.  Hemoglobin A1c performed on this admission was 11.5.  Lipid profile performed on this admission showed a total cholesterol of  99, triglycerides of 54, ACL of 41 and LDL of 47.   DISCHARGE MEDICATIONS:  1. NovoLog insulin pump per pump programming guide.  Basal rates range      from 1.2 to 1.8 depending on the time of day.  2. Metformin 500 mg p.o. b.i.d.  3. Lipitor 40 mg p.o. daily.  4. Lisinopril p.o. daily for home regimen.   DISCHARGE INSTRUCTIONS:  1. The patient is to take medication as mentioned previously.  2. The patient is to followup with Dr. Lucianne [name] with [facility]      Endocrinology on October 2 at 11:15 a.m.  3. The patient is to followup with me, [name], M.D., to      establish primary care on October 17 at 9:45 in the morning.  4. The patient is to change her insulin pump administration site every      3 days.  5. The patient is to continue to monitor her blood sugar readings.  If      they become elevated the  patient is to replace her insulin pump  tubing, cartridge and set up to make sure that this is functioning      properly.  6. The patient is to return to the ED or seek medical care for      persistently high blood sugars, return of headache, abdominal pain      or other concerning symptoms.      [name], MD  Electronically Signed     ______________________________  Janace Litten    TE/MEDQ  D:  10/30/2006  T:  10/30/2006  Job:  [id number]   cc:   Reather Littler, M.D.

## 2010-06-14 NOTE — Consult Note (Signed)
NAMECARMELITE, VIOLET NO.:  [id number]   MEDICAL RECORD NO.:  [id number]          PATIENT TYPE:  INP   LOCATION:  3307                         FACILITY:  [facility]   PHYSICIAN:  Verne Carrow, MDDATE OF BIRTH:  [birth year]   DATE OF CONSULTATION:  DATE OF DISCHARGE:                                 CONSULTATION   PRIMARY CARDIOLOGIST:  New, Verne Carrow, MD   PRIMARY CARE PHYSICIAN:  Leighton Roach McDiarmid, MD   REASON FOR CONSULTATION:  Positive cardiac enzymes in the setting of  DKA.   HISTORY OF PRESENT ILLNESS:  This is a 40 year old African American  female without prior history of CAD who has a longstanding history of  insulin-dependent diabetes with an insulin pump who was admitted with  DKA with recent history of upper respiratory infection.  The patient  states that she felt that suddenly and noticed sugar rising at home with  her own blood glucose monitor in 300s and 400s.  She bolused herself  through her insulin pump and her fingerstick blood sugar prior to the  bolus was 595.  The patient began having polydipsia and polyuria and  having had DKA in the past.  She came in to the emergency room.  The  patient states that she had an achy feeling in her chest with elevated  blood glucose and on arrival, the patient was given IV insulin and her  blood glucose began to come down.  She called her insulin pump  representative and do troubleshooting, it turned out that the insulin  pump insertion needle was bent and she was unable to bolus herself with  any insulin.  She replaced the tubing and reinserted her insulin pump  this a.m. and apparently this was functioning appropriately.  She is  being followed by diabetic educator and coordinator at that time.  The  patient has been without any complaints of chest pain or shortness of  breath during hospitalization.   REVIEW OF SYSTEMS:  Positive for fevers, runny nose, chills, and  dizziness with some  mild discomfort and nausea with history of urinary  frequency and polydipsia.   PAST MEDICAL HISTORY:  1. Insulin-dependent diabetes type 1 on an insulin pump.  2. Hyperlipidemia.  3. History of sciatica.   PAST SURGICAL HISTORY:  Status post C-section, bilateral tubal ligation,  and hysterectomy in 2003.   CURRENT MEDICATIONS:  1. Lipitor 40 mg daily.  2. Lisinopril 5 mg daily.  3. Neurontin 300 mg t.i.d.  4. Insulin pump.  5. Low-molecular-weight heparin.  6. Aspirin 81 mg daily.   ALLERGIES:  She has no known drug allergies.   CURRENT LABORATORY DATA:  Hemoglobin 12.7, hematocrit 37.0, white blood  cells 12.9, and platelets 236.  Sodium 137, potassium 4.1, chloride 111,  CO2 18, BUN 14, creatinine 0.94, glucose 132.  Troponin 0.01, 0.14, and  0.19 respectively.  Total cholesterol 140, triglycerides 49, HDL 59, and  LDL 71.  An EKG revealing normal sinus rhythm with ventricular rate of  94 beats per minute.   PHYSICAL EXAMINATION:  VITAL SIGNS:  Blood pressure 99/55, pulse  99,  respirations 16, temperature 98.6, and O2 sat 100% on room air.  HEENT:  Head is normocephalic and atraumatic.  Eyes, PERRLA.  Mucous  membranes of mouth pink and moist.  Tongue is midline.  NECK:  Supple.  She does have a soft left carotid bruit with no JVD.  No  lymphadenopathy.  CARDIOVASCULAR:  Regular rate and rhythm with 1/6 systolic murmur  auscultated.  Pulses are 2+ and equal bilaterally without any bruits.  Her heart rate is tachycardic in the 90s.  LUNGS:  Clear to auscultation without wheezes, rales, or rhonchi.  ABDOMEN:  Soft, nontender with 2+ bowel sounds.  EXTREMITIES:  No clubbing, cyanosis, or edema.  MUSCULOSKELETAL:  No joint deformity or effusions.  NEUROLOGIC:  Cranial nerves II through XII are grossly intact.   IMPRESSION:  1. Positive cardiac enzymes in the setting of diabetic ketoacidosis.  2. Insulin-dependent diabetes with insulin pump malfunction concerning       tubing.  3. Hyperlipidemia.   PLAN:  This is a 40 year old African American female with cardiovascular  risk factors with insulin-dependent diabetes, family history, and  dyslipidemia who was admitted with diabetic ketoacidosis with pump  tubing being bent internally and not allowing insulin infusion.  She  also has complaints of achy chest pain prior to the admission and mildly  elevated cardiac enzymes.  Her EKG is normal.  The patient also has  systolic heart murmur.  We will add echo to workup to evaluate for LV  function and murmur and either plan stress Myoview as an in or an  outpatient per Dr. Verne Carrow.  Heart rate is elevated which  may be related to mild dehydration, although her creatinine was 0.94.  We will reduce her lisinopril to 2.5 mg daily and consider beta blocker  if her heart rate remains elevated.      Bettey Mare. Lyman Bishop, NP      Verne Carrow, MD  Electronically Signed    KML/MEDQ  D:  12/17/2007  T:  12/18/2007  Job:  [id number]   cc:   Leighton Roach McDiarmid, M.D.

## 2010-11-01 LAB — BASIC METABOLIC PANEL
BUN: 18
BUN: 2 — ABNORMAL LOW
BUN: 20
BUN: 22
BUN: 24 — ABNORMAL HIGH
BUN: 8
CO2: 13 — ABNORMAL LOW
CO2: 14 — ABNORMAL LOW
CO2: 16 — ABNORMAL LOW
CO2: 18 — ABNORMAL LOW
CO2: 19
CO2: 19
Calcium: 8.3 — ABNORMAL LOW
Calcium: 8.4
Calcium: 8.5
Calcium: 8.8
Calcium: 9.3
Chloride: 104
Chloride: 107
Chloride: 109
Creatinine, Ser: 0.91
Creatinine, Ser: 0.94
Creatinine, Ser: 0.94
Creatinine, Ser: 1.01
Creatinine, Ser: 1.06
Creatinine, Ser: 1.2
Creatinine, Ser: 1.22 — ABNORMAL HIGH
GFR calc Af Amer: 59 — ABNORMAL LOW
GFR calc Af Amer: >60
GFR calc Af Amer: >60
GFR calc Af Amer: >60
GFR calc non Af Amer: 58 — ABNORMAL LOW
GFR calc non Af Amer: >60
GFR calc non Af Amer: >60
GFR calc non Af Amer: >60
GFR calc non Af Amer: >60
Glucose, Bld: 132 — ABNORMAL HIGH
Glucose, Bld: 190 — ABNORMAL HIGH
Glucose, Bld: 246 — ABNORMAL HIGH
Glucose, Bld: 366 — ABNORMAL HIGH
Glucose, Bld: 431 — ABNORMAL HIGH
Potassium: 4.3
Potassium: 4.4
Potassium: 4.7
Potassium: 5
Sodium: 135
Sodium: 137

## 2010-11-01 LAB — GLUCOSE, CAPILLARY
Glucose-Capillary: 107 — ABNORMAL HIGH
Glucose-Capillary: 124 — ABNORMAL HIGH
Glucose-Capillary: 124 — ABNORMAL HIGH
Glucose-Capillary: 139 — ABNORMAL HIGH
Glucose-Capillary: 140 — ABNORMAL HIGH
Glucose-Capillary: 140 — ABNORMAL HIGH
Glucose-Capillary: 146 — ABNORMAL HIGH
Glucose-Capillary: 162 — ABNORMAL HIGH
Glucose-Capillary: 165 — ABNORMAL HIGH
Glucose-Capillary: 174 — ABNORMAL HIGH
Glucose-Capillary: 175 — ABNORMAL HIGH
Glucose-Capillary: 185 — ABNORMAL HIGH
Glucose-Capillary: 206 — ABNORMAL HIGH
Glucose-Capillary: 207 — ABNORMAL HIGH
Glucose-Capillary: 207 — ABNORMAL HIGH
Glucose-Capillary: 208 — ABNORMAL HIGH
Glucose-Capillary: 244 — ABNORMAL HIGH
Glucose-Capillary: 305 — ABNORMAL HIGH
Glucose-Capillary: 314 — ABNORMAL HIGH
Glucose-Capillary: 322 — ABNORMAL HIGH
Glucose-Capillary: 360 — ABNORMAL HIGH
Glucose-Capillary: 408 — ABNORMAL HIGH

## 2010-11-01 LAB — LIPID PANEL
HDL: 59
Triglycerides: 49

## 2010-11-01 LAB — CARDIAC PANEL(CRET KIN+CKTOT+MB+TROPI)
CK, MB: 3.2
Relative Index: 1.9
Total CK: 108

## 2010-11-01 LAB — COMPREHENSIVE METABOLIC PANEL
AST: 23
BUN: 24 — ABNORMAL HIGH
CO2: 13 — ABNORMAL LOW
Chloride: 103
Creatinine, Ser: 1.22 — ABNORMAL HIGH
GFR calc Af Amer: 59 — ABNORMAL LOW
GFR calc non Af Amer: 49 — ABNORMAL LOW
Glucose, Bld: 368 — ABNORMAL HIGH
Total Bilirubin: 1.6 — ABNORMAL HIGH

## 2010-11-01 LAB — POCT I-STAT 3, ART BLOOD GAS (G3+)
Acid-base deficit: 11 — ABNORMAL HIGH
Bicarbonate: 13.1 — ABNORMAL LOW
O2 Saturation: 88
TCO2: 14
pO2, Arterial: 57 — ABNORMAL LOW

## 2010-11-01 LAB — DIFFERENTIAL
Basophils Absolute: 0
Eosinophils Relative: 0
Lymphocytes Relative: 7 — ABNORMAL LOW
Neutrophils Relative %: 88 — ABNORMAL HIGH

## 2010-11-01 LAB — URINALYSIS, ROUTINE W REFLEX MICROSCOPIC
Bilirubin Urine: NEGATIVE
Ketones, ur: >80 — AB
Leukocytes, UA: NEGATIVE
Nitrite: NEGATIVE
Specific Gravity, Urine: 1.028
Urobilinogen, UA: 0.2
pH: 6

## 2010-11-01 LAB — CBC
HCT: 37
MCHC: 34.3
MCV: 87.4
Platelets: 236
RDW: 12.7

## 2010-11-01 LAB — MAGNESIUM: Magnesium: 2

## 2010-11-01 LAB — PREGNANCY, URINE: Preg Test, Ur: NEGATIVE

## 2010-11-01 LAB — KETONES, QUALITATIVE: Acetone, Bld: NEGATIVE

## 2010-11-01 LAB — HEMOGLOBIN A1C: Mean Plasma Glucose: 212

## 2010-11-10 LAB — DIFFERENTIAL
Basophils Absolute: 0
Basophils Relative: 0
Eosinophils Absolute: 0.1
Eosinophils Relative: 1
Lymphocytes Relative: 37
Lymphs Abs: 3
Monocytes Absolute: 0.2
Monocytes Relative: 3
Neutro Abs: 4.8
Neutrophils Relative %: 59

## 2010-11-10 LAB — COMPREHENSIVE METABOLIC PANEL WITH GFR
Albumin: 3.9
Alkaline Phosphatase: 78
Calcium: 8.6
GFR calc Af Amer: >60
GFR calc non Af Amer: >60
Glucose, Bld: 248 — ABNORMAL HIGH
Sodium: 135
Total Bilirubin: 1.4 — ABNORMAL HIGH

## 2010-11-10 LAB — BASIC METABOLIC PANEL
BUN: 4 — ABNORMAL LOW
BUN: 4 — ABNORMAL LOW
CO2: 18 — ABNORMAL LOW
CO2: 27
Calcium: 8.4
Calcium: 8.6
Calcium: 9.1
Chloride: 112
Creatinine, Ser: 0.61
Creatinine, Ser: 0.65
Creatinine, Ser: 0.69
Creatinine, Ser: 0.79
GFR calc Af Amer: >60
GFR calc Af Amer: >60
GFR calc Af Amer: >60
GFR calc Af Amer: >60
GFR calc non Af Amer: >60
GFR calc non Af Amer: >60
GFR calc non Af Amer: >60
Glucose, Bld: 354 — ABNORMAL HIGH
Glucose, Bld: 66 — ABNORMAL LOW
Potassium: 3.5
Potassium: 3.7
Sodium: 130 — ABNORMAL LOW
Sodium: 134 — ABNORMAL LOW
Sodium: 141

## 2010-11-10 LAB — URINE CULTURE: Colony Count: 9000

## 2010-11-10 LAB — POCT I-STAT CREATININE
Creatinine, Ser: 0.7
Operator id: 151321

## 2010-11-10 LAB — I-STAT 8, (EC8 V) (CONVERTED LAB)
Acid-base deficit: 12 — ABNORMAL HIGH
BUN: 11
Bicarbonate: 14.9 — ABNORMAL LOW
Chloride: 106
Glucose, Bld: 332 — ABNORMAL HIGH
HCT: 47 — ABNORMAL HIGH
Hemoglobin: 16 — ABNORMAL HIGH
Operator id: 151321
Potassium: 4.5
Sodium: 134 — ABNORMAL LOW
TCO2: 16
pCO2, Ven: 37.7 — ABNORMAL LOW
pH, Ven: 7.205 — ABNORMAL LOW

## 2010-11-10 LAB — CBC
HCT: 36.5
HCT: 44
Hemoglobin: 12.4
Hemoglobin: 15
MCHC: 34.2
MCV: 86.3
Platelets: 279
RBC: 4.22
RBC: 5.1
RDW: 13
RDW: 13.1
WBC: 8.1

## 2010-11-10 LAB — LIPID PANEL
Cholesterol: 99
HDL: 41
LDL Cholesterol: 47
Total CHOL/HDL Ratio: 2.4
Triglycerides: 54
VLDL: 11

## 2010-11-10 LAB — COMPREHENSIVE METABOLIC PANEL
ALT: 16
AST: 13
BUN: 8
CO2: 17 — ABNORMAL LOW
Chloride: 105
Creatinine, Ser: 0.94
Potassium: 4.4
Total Protein: 7

## 2010-11-10 LAB — PREGNANCY, URINE: Preg Test, Ur: NEGATIVE

## 2010-11-10 LAB — PHOSPHORUS: Phosphorus: 2.6

## 2010-11-10 LAB — MAGNESIUM: Magnesium: 1.7

## 2010-11-10 LAB — URINALYSIS, ROUTINE W REFLEX MICROSCOPIC
Bilirubin Urine: NEGATIVE
Glucose, UA: >1000 — AB
Hgb urine dipstick: NEGATIVE
Ketones, ur: >80 — AB
Leukocytes, UA: NEGATIVE
Nitrite: NEGATIVE
Protein, ur: NEGATIVE
Specific Gravity, Urine: 1.034 — ABNORMAL HIGH
Urobilinogen, UA: 0.2
pH: 5.5

## 2010-11-10 LAB — URINE MICROSCOPIC-ADD ON

## 2010-12-27 ENCOUNTER — Other Ambulatory Visit: Payer: Self-pay | Admitting: Family Medicine

## 2010-12-27 ENCOUNTER — Ambulatory Visit
Admission: RE | Admit: 2010-12-27 | Discharge: 2010-12-27 | Disposition: A | Discharge status: Home / Self Care | Payer: BC Managed Care – PPO | Source: Ambulatory Visit | Attending: Emergency Medicine | Admitting: Emergency Medicine

## 2010-12-27 DIAGNOSIS — Z1239 Encounter for other screening for malignant neoplasm of breast: Secondary | ICD-10-CM

## 2011-01-03 ENCOUNTER — Other Ambulatory Visit: Payer: Self-pay | Admitting: Emergency Medicine

## 2011-01-03 DIAGNOSIS — R928 Other abnormal and inconclusive findings on diagnostic imaging of breast: Secondary | ICD-10-CM

## 2011-01-17 ENCOUNTER — Ambulatory Visit
Admission: RE | Admit: 2011-01-17 | Discharge: 2011-01-17 | Disposition: A | Discharge status: Home / Self Care | Payer: BC Managed Care – PPO | Source: Ambulatory Visit | Attending: Emergency Medicine | Admitting: Emergency Medicine

## 2011-01-17 ENCOUNTER — Other Ambulatory Visit: Payer: Self-pay | Admitting: Emergency Medicine

## 2011-01-17 ENCOUNTER — Other Ambulatory Visit: Payer: Self-pay | Admitting: Diagnostic Radiology

## 2011-01-17 DIAGNOSIS — R928 Other abnormal and inconclusive findings on diagnostic imaging of breast: Secondary | ICD-10-CM

## 2011-01-18 ENCOUNTER — Telehealth (INDEPENDENT_AMBULATORY_CARE_PROVIDER_SITE_OTHER): Payer: Self-pay | Admitting: General Surgery

## 2011-01-18 ENCOUNTER — Other Ambulatory Visit: Payer: Self-pay | Admitting: Emergency Medicine

## 2011-01-18 ENCOUNTER — Ambulatory Visit
Admission: RE | Admit: 2011-01-18 | Discharge: 2011-01-18 | Disposition: A | Discharge status: Home / Self Care | Payer: BC Managed Care – PPO | Source: Ambulatory Visit | Attending: Emergency Medicine | Admitting: Emergency Medicine

## 2011-01-18 DIAGNOSIS — C50912 Malignant neoplasm of unspecified site of left female breast: Secondary | ICD-10-CM

## 2011-01-18 DIAGNOSIS — R928 Other abnormal and inconclusive findings on diagnostic imaging of breast: Secondary | ICD-10-CM

## 2011-01-18 DIAGNOSIS — N6320 Unspecified lump in the left breast, unspecified quadrant: Secondary | ICD-10-CM

## 2011-01-18 NOTE — Telephone Encounter (Signed)
Leigh called and she needs a Br Ca appt for this pt diagnosed w/lt br ductal carsenoma w/positive lymphnode, please call to schedule.

## 2011-01-19 ENCOUNTER — Telehealth: Payer: Self-pay | Admitting: Family Medicine

## 2011-01-19 ENCOUNTER — Encounter (INDEPENDENT_AMBULATORY_CARE_PROVIDER_SITE_OTHER): Payer: Self-pay | Admitting: General Surgery

## 2011-01-19 ENCOUNTER — Ambulatory Visit (INDEPENDENT_AMBULATORY_CARE_PROVIDER_SITE_OTHER): Payer: BC Managed Care – PPO | Admitting: General Surgery

## 2011-01-19 VITALS — BP 136/84 | HR 70 | Temp 97.9°F | Resp 18 | Ht 65.0 in | Wt 177.8 lb

## 2011-01-19 DIAGNOSIS — C50912 Malignant neoplasm of unspecified site of left female breast: Secondary | ICD-10-CM

## 2011-01-19 DIAGNOSIS — C50919 Malignant neoplasm of unspecified site of unspecified female breast: Secondary | ICD-10-CM

## 2011-01-19 NOTE — Progress Notes (Addendum)
Patient ID: [name], female   DOB: 10/27/1970, 40 y.o.   MRN: [id number]  Chief Complaint  Patient presents with  . Other    Eval - new breast cancer    HPI [name] is a 40 y.o. female.  She is referred by Dr. Norva Pavlov at the [facility] for a newly diagnosed cancer of the left breast, possibly multifocal, and metastatic to left axilla. Her primary care physician is Dr. Priscella Mann.  The patient does not feel any abnormality in either breast. She states that they did a left breast biopsy at [facility] a year ago but it was negative. Recent screening mammograms were performed and abnormalities were seen in the left breast. Diagnostic left mammogram and ultrasound were performed on December 18 and they see at least 5 nodules, all suspicious. Image guided biopsy of a central mass at the 12:00 position reveals invasive ductal carcinoma. Biopsy of a left axillary lymph node is positive for metastatic ductal carcinoma. Breast diagnostic profile is pending.  She is scheduled for MRI on December 26.  She is scheduled for second biopsies on December 27 to see if she has multifocal disease within the breast.  Family history is positive for breast cancer in a maternal aunt who died at age 72. There is no other family history of ovarian or breast cancer.  The patient has significant medical history with insulin dependent diabetes mellitus on insulin pump, followed by Dr. Drinda Butts and hypertension.  She is married. She has 2 sons and one daughter. HPI  Past Medical History  Diagnosis Date  . Breast mass in female     fibrocystic changes  . Diabetes mellitus     insulin dependant - uses pump  . Cancer     breast    Past Surgical History  Procedure Date  . Bladder tacking 01/2009  . Shoulder surgery 04/26/10    left  . Abdominal hysterectomy 2002  . Tubal ligation 12/1999    Family History  Problem Relation Age of Onset  . Hypertension      . Diabetes    . Deep vein thrombosis    . Kidney disease    . Cancer Maternal Aunt     breast  . Cancer Maternal Uncle     Social History History  Substance Use Topics  . Smoking status: Never Smoker   . Smokeless tobacco: Never Used  . Alcohol Use: No    Allergies  Allergen Reactions  . Hydrocodone-Acetaminophen Itching and Swelling  . Lantus Itching, Nausea And Vomiting and Swelling  . Nph Iletin I (Insulin Isophane, Mixed Nph) Itching, Nausea And Vomiting and Swelling    Current Outpatient Prescriptions  Medication Sig Dispense Refill  . Insulin Aspart (NOVOLOG ) Inject into the skin.        Marland Kitchen gabapentin (NEURONTIN) 300 MG capsule take once a day on day 1, twice a day on day 2, and three times a day thereafter       . lisinopril (PRINIVIL,ZESTRIL) 10 MG tablet Take 10 mg by mouth daily. For blood pressure         Review of Systems Review of Systems  Constitutional: Negative for fever, chills and unexpected weight change.  HENT: Negative for hearing loss, congestion, sore throat, trouble swallowing and voice change.   Eyes: Negative for visual disturbance.  Respiratory: Negative for cough and wheezing.   Cardiovascular: Negative for chest pain, palpitations and leg swelling.  Gastrointestinal: Negative for nausea, vomiting, abdominal pain, diarrhea, constipation, blood in stool, abdominal distention and anal bleeding.  Genitourinary: Negative for hematuria, vaginal bleeding and difficulty urinating.  Musculoskeletal: Negative for arthralgias.  Skin: Negative for rash and wound.  Neurological: Negative for seizures, syncope and headaches.  Hematological: Negative for adenopathy. Does not bruise/bleed easily.  Psychiatric/Behavioral: Negative for confusion.    Blood pressure 136/84, pulse 70, temperature 97.9 F (36.6 C), temperature source Temporal, resp. rate 18, height 5\' 5"  (1.651 m), weight 177 lb 12.8 oz (80.65 kg).  Physical Exam Physical Exam   Constitutional: She is oriented to person, place, and time. She appears well-developed and well-nourished. No distress.       Husband is with her throughout the encounter  HENT:  Head: Normocephalic and atraumatic.  Nose: Nose normal.  Mouth/Throat: No oropharyngeal exudate.  Eyes: Conjunctivae and EOM are normal. Pupils are equal, round, and reactive to light. Left eye exhibits no discharge. No scleral icterus.  Neck: Neck supple. No JVD present. No tracheal deviation present. No thyromegaly present.  Cardiovascular: Normal rate, regular rhythm, normal heart sounds and intact distal pulses.   No murmur heard. Pulmonary/Chest: Effort normal and breath sounds normal. No respiratory distress. She has no wheezes. She has no rales. She exhibits no tenderness.    Abdominal: Soft. Bowel sounds are normal. She exhibits no distension and no mass. There is no tenderness. There is no rebound and no guarding.       Insulin pump in place abdominal wall  Musculoskeletal: She exhibits no edema and no tenderness.  Lymphadenopathy:    She has no cervical adenopathy.  Neurological: She is alert and oriented to person, place, and time. She exhibits normal muscle tone. Coordination normal.  Skin: Skin is warm. No rash noted. She is not diaphoretic. No erythema. No pallor.  Psychiatric: She has a normal mood and affect. Her behavior is normal. Judgment and thought content normal.    Data Reviewed  I reviewed her mammograms, ultrasound, and pathology report. I have referred her to medical oncology and genetic counseling.  ADDENDUM:  MRI shows 5 hypermetabolic masses in left breast and uptake in axilla. Right breast unremarkable.     Assessment    Invasive ductal carcinoma left breast, metastatic to axillary lymph nodes. Clinical stage is probably T2, N1 at least. This may be multifocal within the breast.  Insulin-dependent diabetes mellitus, on insulin pump  Hypertension    Plan    I had a  very long talk with the patient and her husband. I told her that decisions regarding extent of surgery and timing of surgery would depend on definition of the extent of local disease as well as systemic staging.  She will get an MRI of the breast on December 26.  She will get further image guided biopsies on December 27.  I am going to go ahead and refer her to medical oncology for their opinion and coordination of care.  She will return to see me in 3 weeks.  She is aware that her case will be presented at our breast conference, probably 2 weeks from now.       Teneshia Hedeen M 01/19/2011, 1:38 PM

## 2011-01-19 NOTE — Telephone Encounter (Signed)
Pt scheduled on Tuesday, ins requires precert.

## 2011-01-19 NOTE — Patient Instructions (Signed)
You have been diagnosed with invasive ductal carcinoma of the left breast as well as breast cancer which has spread to the left axillary lymph nodes. You are scheduled for a breast MRI next week, and you are also scheduled for further breast  biopsies to see if the cancer is in more than one spot in your breast.  I am going to refer you to a medical oncologist for evaluation. They may do further staging x-rays. I am also going to refer you to a genetic counselor to discuss whether he should have genetic testing.  Return to see me in 3 weeks. At that time hopefully we will have all the information together where we can make decisions for a final treatment plan.

## 2011-01-19 NOTE — Telephone Encounter (Signed)
Called Kristi at [facility], informed her that we have not seen this patient nor did we schedule this.

## 2011-01-20 ENCOUNTER — Encounter: Payer: Self-pay | Admitting: Family Medicine

## 2011-01-20 ENCOUNTER — Other Ambulatory Visit (INDEPENDENT_AMBULATORY_CARE_PROVIDER_SITE_OTHER): Payer: Self-pay | Admitting: General Surgery

## 2011-01-20 DIAGNOSIS — N6320 Unspecified lump in the left breast, unspecified quadrant: Secondary | ICD-10-CM

## 2011-01-20 DIAGNOSIS — N63 Unspecified lump in unspecified breast: Secondary | ICD-10-CM

## 2011-01-20 DIAGNOSIS — C50912 Malignant neoplasm of unspecified site of left female breast: Secondary | ICD-10-CM

## 2011-01-25 ENCOUNTER — Ambulatory Visit
Admission: RE | Admit: 2011-01-25 | Discharge: 2011-01-25 | Disposition: A | Discharge status: Home / Self Care | Payer: BC Managed Care – PPO | Source: Ambulatory Visit | Attending: Emergency Medicine | Admitting: Emergency Medicine

## 2011-01-25 DIAGNOSIS — C50912 Malignant neoplasm of unspecified site of left female breast: Secondary | ICD-10-CM

## 2011-01-25 MED ORDER — GADOBENATE DIMEGLUMINE 529 MG/ML IV SOLN
18.0000 mL | Freq: Once | INTRAVENOUS | Status: AC | PRN
  Administered 2011-01-25: 18 mL via INTRAVENOUS

## 2011-01-26 ENCOUNTER — Ambulatory Visit
Admission: RE | Admit: 2011-01-26 | Discharge: 2011-01-26 | Disposition: A | Discharge status: Home / Self Care | Payer: BC Managed Care – PPO | Source: Ambulatory Visit | Attending: General Surgery | Admitting: General Surgery

## 2011-01-26 ENCOUNTER — Telehealth: Payer: Self-pay | Admitting: *Deleted

## 2011-01-26 ENCOUNTER — Ambulatory Visit
Admission: RE | Admit: 2011-01-26 | Discharge: 2011-01-26 | Disposition: A | Discharge status: Home / Self Care | Payer: BC Managed Care – PPO | Source: Ambulatory Visit | Attending: Emergency Medicine | Admitting: Emergency Medicine

## 2011-01-26 ENCOUNTER — Other Ambulatory Visit (INDEPENDENT_AMBULATORY_CARE_PROVIDER_SITE_OTHER): Payer: Self-pay | Admitting: General Surgery

## 2011-01-26 DIAGNOSIS — N6320 Unspecified lump in the left breast, unspecified quadrant: Secondary | ICD-10-CM

## 2011-01-26 DIAGNOSIS — C50912 Malignant neoplasm of unspecified site of left female breast: Secondary | ICD-10-CM

## 2011-01-26 NOTE — Telephone Encounter (Signed)
Confirmed 01/27/11 genetics appt w/ pt.  Confirmed 02/16/11 med onc appt w/ pt.  Mailed before letter & packet to pt.

## 2011-01-27 ENCOUNTER — Ambulatory Visit: Payer: BC Managed Care – PPO

## 2011-01-27 NOTE — Progress Notes (Signed)
Blood drawn and sent to [facility] for BRCA1/BRCA2 testing. TAT ~2 weeks. 

## 2011-02-02 ENCOUNTER — Encounter: Payer: Self-pay | Admitting: Family Medicine

## 2011-02-03 ENCOUNTER — Other Ambulatory Visit: Payer: Self-pay | Admitting: *Deleted

## 2011-02-03 DIAGNOSIS — C50419 Malignant neoplasm of upper-outer quadrant of unspecified female breast: Secondary | ICD-10-CM

## 2011-02-06 ENCOUNTER — Telehealth: Payer: Self-pay | Admitting: Oncology

## 2011-02-06 ENCOUNTER — Ambulatory Visit (HOSPITAL_BASED_OUTPATIENT_CLINIC_OR_DEPARTMENT_OTHER): Payer: BC Managed Care – PPO | Admitting: Oncology

## 2011-02-06 ENCOUNTER — Ambulatory Visit: Payer: BC Managed Care – PPO

## 2011-02-06 ENCOUNTER — Other Ambulatory Visit (HOSPITAL_BASED_OUTPATIENT_CLINIC_OR_DEPARTMENT_OTHER): Payer: BC Managed Care – PPO | Admitting: Lab

## 2011-02-06 VITALS — BP 132/84 | HR 93 | Temp 98.2°F | Ht 65.0 in | Wt 183.7 lb

## 2011-02-06 DIAGNOSIS — E109 Type 1 diabetes mellitus without complications: Secondary | ICD-10-CM

## 2011-02-06 DIAGNOSIS — C50419 Malignant neoplasm of upper-outer quadrant of unspecified female breast: Secondary | ICD-10-CM

## 2011-02-06 DIAGNOSIS — C50919 Malignant neoplasm of unspecified site of unspecified female breast: Secondary | ICD-10-CM

## 2011-02-06 DIAGNOSIS — Z9641 Presence of insulin pump (external) (internal): Secondary | ICD-10-CM | POA: Diagnosis not present

## 2011-02-06 LAB — CBC WITH DIFFERENTIAL/PLATELET
EOS%: 1.8 % (ref 0.0–7.0)
Eosinophils Absolute: 0.1 10*3/uL (ref 0.0–0.5)
LYMPH%: 30 % (ref 14.0–49.7)
MCH: 30.6 pg (ref 25.1–34.0)
MCV: 87.4 fL (ref 79.5–101.0)
MONO%: 3.6 % (ref 0.0–14.0)
NEUT#: 4.2 10*3/uL (ref 1.5–6.5)
Platelets: 252 10*3/uL (ref 145–400)
RBC: 4.39 10*6/uL (ref 3.70–5.45)
RDW: 13.5 % (ref 11.2–14.5)

## 2011-02-06 LAB — COMPREHENSIVE METABOLIC PANEL
AST: 17 U/L (ref 0–37)
Alkaline Phosphatase: 89 U/L (ref 39–117)
BUN: 14 mg/dL (ref 6–23)
Glucose, Bld: 236 mg/dL — ABNORMAL HIGH (ref 70–99)
Potassium: 3.9 mEq/L (ref 3.5–5.3)
Sodium: 135 mEq/L (ref 135–145)
Total Bilirubin: 0.6 mg/dL (ref 0.3–1.2)
Total Protein: 7.6 g/dL (ref 6.0–8.3)

## 2011-02-06 NOTE — Progress Notes (Signed)
Patient History and Physical   [name] [id number] 1970-03-26 40 y.o. 02/06/2011  CC: Dr Edythe Lynn; Dr Priscella Mann; dr Reather Littler  Chief Complaint: Breast mass detected on mammogram  HPI:  This patient been previously well..Screening breast mammogram when 27 2012 showed a possible mass in the left breast additional views were recommended. Diagnostic left mammogram left breast ultrasound 01/17/2011 showed a multiple slowly regular nodules the left breast. At the 1:00 position there was a nodule measuring 7 x 8 x 8 mm. A separate lesion was seen 18 cm from the nipple measuring 4 x 7 mm the third lesion measuring 1.2 x 0.9 x 1.2 cm received 10 cm from the nipple. 9:00 position there was a nodule measuring 8 x 8 x 10 mm at the 12:00 position within the nipple regular mass measuring 2.4 x 1.6 x 2.6 cm were seen. An enlarged lymph node was also visualized on ultrasound. Biopsies of the lesions took place on 1218 he 12:00 lesion and the axillary mass both showed high-grade invasive ductal cancer grade 2-3. This setting HER-2 ratio of 1.25 ER and PR 0, proliferative index 70%.a separate lesion was biopsied on 12/27 this all showed a high-grade invasive ductal cancer . MRI of both breasts performed 01/25/2011 showed biopsy-proven mass at 12:00 measuring 2.4 x 2.4 x 1.8 cm with additional for 4  breast lesions seen. In addition the level I axillary lymph node was seen as well as prominent level II  retropectorall lymph node seen . PMH: Past Medical History  Diagnosis Date  . Breast mass in female 2011     fibrocystic changes  . Diabetes mellitus diagnosed at age 31     insulin dependant - uses pump  . Cancer     breast    Past Surgical History  Procedure Date  . Bladder tacking 01/2009  . Shoulder surgery 04/26/10    left  . Abdominal hysterectomy ovaries left intact 2002  . Tubal ligation 12/1999    Allergies: Allergies  Allergen Reactions  . Hydrocodone-Acetaminophen  Itching and Swelling  . Lantus Itching, Nausea And Vomiting and Swelling  . Nph Iletin I (Insulin Isophane, Mixed Nph) Itching, Nausea And Vomiting and Swelling    Medications: Medications Prior to Admission  Medication Sig Dispense Refill  . gabapentin (NEURONTIN) 300 MG capsule take once a day on day 1, twice a day on day 2, and three times a day thereafter       . Insulin Aspart (NOVOLOG [place]) Inject into the skin.        Marland Kitchen lisinopril (PRINIVIL,ZESTRIL) 10 MG tablet Take 10 mg by mouth daily. For blood pressure        No current facility-administered medications on file as of 02/06/2011.    Social History:   reports that she has never smoked. She has never used smokeless tobacco. She reports that she does not drink alcohol or use illicit drugs.  Family History: Family History  Problem Relation Age of Onset  . Hypertension    . Diabetes    . Deep vein thrombosis    . Kidney disease    . Cancer Maternal Aunt     breast  . Cancer Maternal Uncle     Reproductive History  G4 P3, menarche age 50 , and menopause at time of hysterectomy no history of replacement therapy   Social History Patient is usually from [facility] [place] she is a stay-at-home mother. Husband works for a trucking company and  is a Production designer, theatre/television/film. She has 2 sons ages 53 and 35 [place] and a daughter age 42 is a Holiday representative at [employer] high school who is the daughter from an other relationship. She's been married for 4 years.  She is a genetic testing done recently.  . Review of Systems: Constitutional ROS: Fever no, Chills, Night Sweats, Anorexia, Pain no Cardiovascular ROS: no chest pain or dyspnea on exertion Respiratory ROS: no cough, shortness of breath, or wheezing Neurological ROS: negative Dermatological ROS: positive for dry skin ENT ROS: negative Gastrointestinal ROS: negative Genito-Urinary ROS: negative Hematological and Lymphatic ROS: negative Breast ROS: negative Musculoskeletal ROS:  negative Remaining ROS negative.  Physical Exam: Blood pressure 132/84, pulse 93, temperature 98.2 F (36.8 C), height 5\' 5"  (1.651 m), weight 183 lb 11.2 oz (83.326 kg). General appearance: alert and cooperative Head: Normocephalic, without obvious abnormality, atraumatic Neck: no adenopathy, no carotid bruit, no JVD, supple, symmetrical, trachea midline and thyroid not enlarged, symmetric, no tenderness/mass/nodules Lymph nodes: Cervical, supraclavicular, and axillary nodes normal. chest clear, no wheezing, rales, normal symmetric air entry, no tachypnea, retractions or cyanosis, Heart exam - S1, S2 normal, no murmur, no gallop, rate regular regular rate and rhythm, S1, S2 normal, no murmur, click, rub or gallop abdomen is soft without significant tenderness, masses, organomegaly or guarding Breasts: palpable 3 cm mass 12 o'clock, lt breast with ipsilateral adenopathy. normal appearance, no masses or tenderness extremities normal, atraumatic, no cyanosis or edema Grossly normal  Lab Results: Lab Results  Component Value Date   WBC 6.5 02/06/2011   HGB 13.4 02/06/2011   HCT 38.4 02/06/2011   MCV 87.4 02/06/2011   PLT 252 02/06/2011     Chemistry      Component Value Date/Time   NA 135 02/06/2011 0842   K 3.9 02/06/2011 0842   CL 99 02/06/2011 0842   CO2 26 02/06/2011 0842   BUN 14 02/06/2011 0842   CREATININE 0.79 02/06/2011 0842      Component Value Date/Time   CALCIUM 9.5 02/06/2011 0842   ALKPHOS 89 02/06/2011 0842   AST 17 02/06/2011 0842   ALT 20 02/06/2011 0842   BILITOT 0.6 02/06/2011 0842       Radiological Studies:  Per above  Impression and Plan:  41 year old menopausal woman presents with a locally advanced breast cancer it is likely multi-centric with ipsilateral adenopathy. We discussed going ahead with staging evaluation. Essentially she has aggressive triple negative breast cancer and will undergo mastectomy in the next week or so. I recommended staging PET scan, port placement  during surgery as well as chemotherapy teaching. One potential sources difficulty with her the fact that she is a type I diabetic and is quite brittle. Fortunately her insulin pump is been effective at controlling your blood sugars over hemoglobin A1c continues to be in the 8 range. At this point we will see her again in followup in approximately 7-10 days after she has completed her surgery, and had her scans.  Approxi-70 minutes was spent with the patient half the time was spent in patient-related counseling.      Pierce Crane, MD 02/06/2011, 11:27 PM

## 2011-02-06 NOTE — Telephone Encounter (Signed)
Gv pt appt for jan2013.  scheduled pt for pet scan 02/13/2011 @ [facility]

## 2011-02-07 ENCOUNTER — Other Ambulatory Visit: Payer: BC Managed Care – PPO

## 2011-02-07 ENCOUNTER — Encounter: Payer: Self-pay | Admitting: *Deleted

## 2011-02-09 ENCOUNTER — Encounter (INDEPENDENT_AMBULATORY_CARE_PROVIDER_SITE_OTHER): Payer: Self-pay | Admitting: General Surgery

## 2011-02-09 ENCOUNTER — Telehealth: Payer: Self-pay | Admitting: Genetic Counselor

## 2011-02-09 ENCOUNTER — Ambulatory Visit (INDEPENDENT_AMBULATORY_CARE_PROVIDER_SITE_OTHER): Payer: BC Managed Care – PPO | Admitting: General Surgery

## 2011-02-09 ENCOUNTER — Encounter (HOSPITAL_COMMUNITY): Payer: Self-pay | Admitting: Pharmacy Technician

## 2011-02-09 VITALS — BP 106/70 | HR 84 | Temp 98.4°F | Ht 65.0 in | Wt 184.6 lb

## 2011-02-09 DIAGNOSIS — C50912 Malignant neoplasm of unspecified site of left female breast: Secondary | ICD-10-CM

## 2011-02-09 DIAGNOSIS — C50919 Malignant neoplasm of unspecified site of unspecified female breast: Secondary | ICD-10-CM

## 2011-02-09 NOTE — Patient Instructions (Signed)
We have found that you have multifocal cancer in your left breast and metastasis to your left axillary lymph nodes. You will be scheduled for left modified radical mastectomy and insertion of Port-A-Cath. Be sure to keep your appt. for your PET scan on January 14.  Total or Modified Radical Mastectomy  Care After Refer to this sheet in the next few weeks. These instructions provide you with information on caring for yourself after your procedure. Your caregiver may also give you more specific instructions. Your treatment has been planned according to current medical practices, but problems sometimes occur. Call your caregiver if you have any problems or questions after your procedure. ACTIVITY  Your caregiver will advise you when you may resume strenuous activities, driving, and sports.   After the drain(s) are removed, you may do light housework. Avoid heavy lifting, carrying, or pushing. You should not be lifting anything heavier than 5 lbs.   Take frequent rest periods. You may tire more easily than usual.   Always rest and elevate the arm affected by your surgery for a period of time equal to your activity time.   Continue doing the exercises given to you by the physical therapist/occupational therapist even after full range of motion has returned. The amount of time this takes will vary from person to person.   After normal range of motion has returned, some stiffness and soreness may persist for 2-3 months. This is normal and will subside.   Begin sports or strenuous activities in moderation. This will give you a chance to rebuild your endurance. Continue to be cautious of heavy lifting or carrying (no more than 10 lbs.) with your affected arm.   You may return to work as recommended by your caregiver.  NUTRITION  You may resume your normal diet.   Make sure you drink plenty of fluids (6-8 glasses a day).   Eat a well-balanced diet. Including daily portions of food from government  recommended food groups:   Grains.   Vegetables.   Fruits.   Milk.   Meat & beans.   Oils.  Visit DateT[employer].[email or web address] for more information HYGIENE  You may wash your hair.   If your incision (cut from surgery) is closed, you may shower or tub bathe, unless instructed otherwise by your doctor.  FEVER  If you feel feverish or have shaking chills, take your temperature. If your temperature is 102 F (38.9 C) or above, call your caregiver. The fever may mean there is an infection.   If you call early, infection can be treated with antibiotics and hospitalization may be avoided.  PAIN CONTROL  Mild discomfort may occur.   You may need to take an over-the-counter pain medication or a medication prescribed by your caregiver.   Call your caregiver if you experience increased pain.  INCISION CARE  Check your incision daily for increased redness, drainage, swelling, or separation of skin.   Call your caregiver if any of the above are noted.  ARM AND HAND CARE  If the lymph nodes under your arm were removed with a modified radical mastectomy, there may be a greater tendency for the arm to swell.   Try to avoid having blood pressures taken, blood drawn, or injections given in the affected arm. This is the arm on the same side as the surgery.   Use hand lotion to soften cuticles instead of cutting them to avoid cutting yourself.   Be careful when shaving your under arms. Use an electric shaver if  possible. You may use a deodorant after the incision has completely healed. Until then, clean under your arms with hydrogen peroxide.   Use reasonable precaution when cooking, sewing, and gardening to avoid burning or needle or thorn pricks.   Do not weigh your arm straight down with a package or your purse.   Follow the exercises and instructions given to you by the physical therapist/occupational therapist and your caregiver.  FOLLOW-UP APPOINTMENT Call your caregiver for a  follow-up appointment as directed. PROSTHESIS INFORMATION Wear your temporary prosthesis (artificial breast) until your caregiver gives you permission to purchase a permanent one. This will depend upon your rate of healing. We suggest you also wait until you are physically and emotionally ready to shop for one. The suitability depends on several individual factors. We do not endorse any particular prosthesis, but suggest you try several until you are satisfied with appearance and fit. A list of stores may be obtained from your local [employer] at www.cancer.org or 1-800-ACS-2345 (1-(979)859-6725). A permanent prosthesis is medically necessary to restore balance. It is also income tax deductible. Be sure all receipts are marked "surgical". It is not essential to purchase a bra. You may sew a pocket into your regular bra. Note: Remember to take all of your medical insurance information with you when shopping for your prosthesis. SELECTING A PROSTHESIS FITTER You may want to ask the following questions when selecting a fitter:  What styles and brands of forms are carried in stock?   How long have the forms been on the market and have there been any problems with them?   Why would one form be better than another?   How long should a particular form last?   May I wear the form for a trial period without obligation?   Do the forms require a prosthetic bra? If so, what is the price range? Must I always wear that style?   If alterations to the bra are necessary, can they be done at this location or be sent out?   Will I be charged for alterations?   Will I receive suggestions on how to alter my own wardrobe, if necessary?   Will you special order forms or bras if necessary?   Are fitters always available to meet my needs?   What kinds of garments should be worn for the fitting?   Are lounge wear, swim wear, and accessories available?   If I have insurance coverage or Medicare,  will you suggest ways for processing the paper work?   Do you keep complete records so that mail reordering is possible?   How are warranty claims handled if I have a problem with the form?  Document Released: 09/09/2003 Document Revised: 09/28/2010 Document Reviewed: 05/14/2007 [facility] Patient Information 2012 [place], [place].   Implanted Port Instructions An implanted port is a central line that has a round shape and is placed under the skin. It is used for long-term IV (intravenous) access for:  Medicine.   Fluids.   Liquid nutrition, such as TPN (total parenteral nutrition).   Blood samples.  Ports can be placed:  In the chest area just below the collarbone (this is the most common place.)   In the arms.   In the belly (abdomen) area.   In the legs.  PARTS OF THE PORT A port has 2 main parts:  The reservoir. The reservoir is round, disc-shaped, and will be a small, raised area under your skin.   The reservoir is  the part where a needle is inserted (accessed) to either give medicines or to draw blood.   The catheter. The catheter is a long, slender tube that extends from the reservoir. The catheter is placed into a large vein.   Medicine that is inserted into the reservoir goes into the catheter and then into the vein.  INSERTION OF THE PORT  The port is surgically placed in either an operating room or in a procedural area (interventional radiology).   Medicine may be given to help you relax during the procedure.   The skin where the port will be inserted is numbed (local anesthetic).   1 or 2 small cuts (incisions) will be made in the skin to insert the port.   The port can be used after it has been inserted.  INCISION SITE CARE  The incision site may have small adhesive strips on it. This helps keep the incision site closed. Sometimes, no adhesive strips are placed. Instead of adhesive strips, a special kind of surgical glue is used to keep the incision  closed.   If adhesive strips were placed on the incision sites, do not take them off. They will fall off on their own.   The incision site may be sore for 1 to 2 days. Pain medicine can help.   Do not get the incision site wet. Bathe or shower as directed by your caregiver.   The incision site should heal in 5 to 7 days. A small scar may form after the incision has healed.  ACCESSING THE PORT Special steps must be taken to access the port:  Before the port is accessed, a numbing cream can be placed on the skin. This helps numb the skin over the port site.   A sterile technique is used to access the port.   The port is accessed with a needle. Only "non-coring" port needles should be used to access the port. Once the port is accessed, a blood return should be checked. This helps ensure the port is in the vein and is not clogged (clotted).   If your caregiver believes your port should remain accessed, a clear (transparent) bandage will be placed over the needle site. The bandage and needle will need to be changed every week or as directed by your caregiver.   Keep the bandage covering the needle clean and dry. Do not get it wet. Follow your caregiver's instructions on how to take a shower or bath when the port is accessed.   If your port does not need to stay accessed, no bandage is needed over the port.  FLUSHING THE PORT Flushing the port keeps it from getting clogged. How often the port is flushed depends on:  If a constant infusion is running. If a constant infusion is running, the port may not need to be flushed.   If intermittent medicines are given.   If the port is not being used.  For intermittent medicines:  The port will need to be flushed:   After medicines have been given.   After blood has been drawn.   As part of routine maintenance.   A port is normally flushed with:   Normal saline.   Heparin.   Follow your caregiver's advice on how often, how much, and  the type of flush to use on your port.  IMPORTANT PORT INFORMATION  Tell your caregiver if you are allergic to heparin.   After your port is placed, you will get a manufacturer's information  card. The card has information about your port. Keep this card with you at all times.   There are many types of ports available. Know what kind of port you have.   In case of an emergency, it may be helpful to wear a medical alert bracelet. This can help alert health care workers that you have a port.   The port can stay in for as long as your caregiver believes it is necessary.   When it is time for the port to come out, surgery will be done to remove it. The surgery will be similar to how the port was put in.   If you are in the hospital or clinic:   Your port will be taken care of and flushed by a nurse.   If you are at home:   A home health care nurse may give medicines and take care of the port.   You or a family member can get special training and directions for giving medicine and taking care of the port at home.  SEEK IMMEDIATE MEDICAL CARE IF:   Your port does not flush or you are unable to get a blood return.   New drainage or pus is coming from the incision.   A bad smell is coming from the incision site.   You develop swelling or increased redness at the incision site.   You develop increased swelling or pain at the port site.   You develop swelling or pain in the surrounding skin near the port.   You have an oral temperature above 102 F (38.9 C), not controlled by medicine.  MAKE SURE YOU:   Understand these instructions.   Will watch your condition.   Will get help right away if you are not doing well or get worse.  Document Released: 01/16/2005 Document Revised: 09/28/2010 Document Reviewed: 04/09/2008 [facility] Patient Information 2012 [place], [place].

## 2011-02-09 NOTE — Progress Notes (Addendum)
Subjective:     Patient ID: [name], female   DOB: 03-09-1970, 41 y.o.   MRN: [id number]  HPI  This patient underwent a second image guided biopsy of her left breast at 1:00 position, and is also shows invasive ductal carcinoma. She therefore has multifocal disease in her left breast metastatic to the axilla.  Bilateral breast MRI shows 5 breast masses, consistent with her multifocal disease and abnormal left axillary lymph nodes. There is no evidence of malignancy on the right.  The patient verbally tells me that her genetic testing is negative.  She is scheduled for a PET scan on January 14.  I have discussed her case in breast conference and I have discussed it with Dr. Donnie Coffin. Our advice is to proceed with left modified radical mastectomy and Port-A-Cath insertion. We have advised holding off on prophylactic right mastectomy because of increased wound complications and insulin-dependent diabetic. She is OK with that.   I have discussed her case with Dr. Drinda Butts who will assist with management of her insulin requirements and perioperative necessary. Review of Systems     Objective:   Physical Exam Constitutional:    patient looks well. She is in no distress.  Neck:    no mass.  Lungs:    clear to auscultation bilaterally.  Heart:    regular rate and rhythm. No murmur. No ectopy.       Breast:    vague thickening left breast at 12:00. Obvious mobile left axillary lymph node. Right breast exam unremarkable. Abdomen:      is looped pump in place abdominal wall.    Assessment:     Invasive ductal carcinoma left breast, multifocal, metastatic to axillary lymph nodes. Clinical stage TII, N1.  Insulin-dependent diabetes mellitus, managed with insulin pump  Hypertension  Negative genetic testing by verbal report     Plan:     Proceed with scheduling for left modified radical mastectomy and Port-A-Cath insertion.  I have discussed the indications and details of  surgery with the patient and her husband. Complications have been outlined, including but not limited to bleeding, infection, skin necrosis,  transfusion, arm numbness, arm pain, arm swelling, pneumothorax, port malfunction, and other unforseen problems. She understands these issues. All questions are answered. She agrees with the plan.  ADDENDUM:   PET shows hypermetabolic uptake in left breast and left axilla, but no obvious metastatic disease.     Angelia Mould. Derrell Lolling, M.D., [facility] General and Minimally invasive Surgery Breast and Colorectal Surgery Office:   [phone] Pager:   [phone]   I have notified Dr. Drinda Butts who has agreed to assist with perioperative insulin management, if needed.

## 2011-02-09 NOTE — Telephone Encounter (Signed)
Debbie from [facility] called while pt was w/her in office to move appt for 1/22 to 1/21 due to pt having surgery 1/22. appt moved to 1/21 @ 3:15 pm and given to pt by debbie.

## 2011-02-13 ENCOUNTER — Encounter (HOSPITAL_COMMUNITY): Payer: Self-pay

## 2011-02-13 ENCOUNTER — Encounter (HOSPITAL_COMMUNITY)
Admission: RE | Admit: 2011-02-13 | Discharge: 2011-02-13 | Disposition: A | Discharge status: Home / Self Care | Payer: BC Managed Care – PPO | Source: Ambulatory Visit | Attending: Oncology | Admitting: Oncology

## 2011-02-13 DIAGNOSIS — R1909 Other intra-abdominal and pelvic swelling, mass and lump: Secondary | ICD-10-CM | POA: Insufficient documentation

## 2011-02-13 DIAGNOSIS — C50919 Malignant neoplasm of unspecified site of unspecified female breast: Secondary | ICD-10-CM | POA: Diagnosis not present

## 2011-02-13 DIAGNOSIS — C50419 Malignant neoplasm of upper-outer quadrant of unspecified female breast: Secondary | ICD-10-CM

## 2011-02-13 LAB — GLUCOSE, CAPILLARY: Glucose-Capillary: 223 mg/dL — ABNORMAL HIGH (ref 70–99)

## 2011-02-13 MED ORDER — FLUDEOXYGLUCOSE F - 18 (FDG) INJECTION
18.1000 | Freq: Once | INTRAVENOUS | Status: AC | PRN
  Administered 2011-02-13: 18.1 via INTRAVENOUS

## 2011-02-14 ENCOUNTER — Encounter: Payer: Self-pay | Admitting: *Deleted

## 2011-02-14 NOTE — Progress Notes (Signed)
Mailed after appt letter to pt. 

## 2011-02-16 ENCOUNTER — Encounter (HOSPITAL_COMMUNITY)
Admission: RE | Admit: 2011-02-16 | Discharge: 2011-02-16 | Disposition: A | Discharge status: Home / Self Care | Payer: BC Managed Care – PPO | Source: Ambulatory Visit | Attending: General Surgery | Admitting: General Surgery

## 2011-02-16 ENCOUNTER — Other Ambulatory Visit: Payer: Self-pay

## 2011-02-16 ENCOUNTER — Encounter (HOSPITAL_COMMUNITY): Payer: Self-pay

## 2011-02-16 DIAGNOSIS — N6489 Other specified disorders of breast: Secondary | ICD-10-CM | POA: Diagnosis not present

## 2011-02-16 HISTORY — DX: Nausea with vomiting, unspecified: R11.2

## 2011-02-16 HISTORY — DX: Unspecified cataract: H26.9

## 2011-02-16 HISTORY — DX: Essential (primary) hypertension: I10

## 2011-02-16 LAB — URINALYSIS, ROUTINE W REFLEX MICROSCOPIC
Glucose, UA: NEGATIVE mg/dL
Hgb urine dipstick: NEGATIVE
Protein, ur: NEGATIVE mg/dL
pH: 6 (ref 5.0–8.0)

## 2011-02-16 LAB — COMPREHENSIVE METABOLIC PANEL
ALT: 21 U/L (ref 0–35)
Albumin: 3.8 g/dL (ref 3.5–5.2)
Alkaline Phosphatase: 89 U/L (ref 39–117)
Calcium: 9.4 mg/dL (ref 8.4–10.5)
Potassium: 4.2 mEq/L (ref 3.5–5.1)
Sodium: 141 mEq/L (ref 135–145)
Total Protein: 7.3 g/dL (ref 6.0–8.3)

## 2011-02-16 LAB — CBC
MCHC: 34.9 g/dL (ref 30.0–36.0)
RDW: 12.9 % (ref 11.5–15.5)

## 2011-02-16 LAB — DIFFERENTIAL
Eosinophils Absolute: 0.1 10*3/uL (ref 0.0–0.7)
Eosinophils Relative: 2 % (ref 0–5)
Lymphocytes Relative: 53 % — ABNORMAL HIGH (ref 12–46)
Lymphs Abs: 2.9 10*3/uL (ref 0.7–4.0)
Monocytes Relative: 5 % (ref 3–12)
Neutrophils Relative %: 40 % — ABNORMAL LOW (ref 43–77)

## 2011-02-16 LAB — SURGICAL PCR SCREEN
MRSA, PCR: NEGATIVE
Staphylococcus aureus: NEGATIVE

## 2011-02-16 LAB — URINE MICROSCOPIC-ADD ON

## 2011-02-16 MED ORDER — CHLORHEXIDINE GLUCONATE 4 % EX LIQD
1.0000 "application " | Freq: Once | CUTANEOUS | Status: DC
  Filled 2011-02-16: qty 15

## 2011-02-16 NOTE — Progress Notes (Signed)
Forwarded EKG to anesthesia for review.  

## 2011-02-16 NOTE — Pre-Procedure Instructions (Signed)
20 [name]  02/16/2011   Your procedure is scheduled on:  Tuesday, February 21, 2011  Report to [facility] at 0900 AM.  Call this number if you have problems the morning of surgery: [phone]   Remember:   Do not eat food:After Midnight.  May have clear liquids: up to 4 Hours before arrival.(up to 5:00am)  Clear liquids include soda, tea, black coffee, apple or grape juice, broth.  Take these medicines the morning of surgery with A SIP OF WATER: gabapentin, insulin pump as per endocrinologist suggestion   Do not wear jewelry, make-up or nail polish.  Do not wear lotions, powders, or perfumes. You may wear deodorant.  Do not shave 48 hours prior to surgery.  Do not bring valuables to the hospital.  Contacts, dentures or bridgework may not be worn into surgery.  Leave suitcase in the car. After surgery it may be brought to your room.  For patients admitted to the hospital, checkout time is 11:00 AM the day of discharge.   Patients discharged the day of surgery will not be allowed to drive home.  Name and phone number of your driver: Keisha Amer [phone]  Special Instructions: CHG Shower Use Special Wash: 1/2 bottle night before surgery and 1/2 bottle morning of surgery.   Please read over the following fact sheets that you were given: Pain Booklet, Coughing and Deep Breathing, MRSA Information and Surgical Site Infection Prevention

## 2011-02-17 NOTE — Consult Note (Signed)
Anesthesia:  Patient is a 41 year old female scheduled for a left modified radical mastectomy, insertion of a Port-a-cath on 02/21/11.  Her other history includes HLD, DM with an insulin pump, cataracts, HTN, and hysterectomy.  CXR showed no acute cardiopulmonary disease.  Her EKG showed NSR with sinus arrhythmia, cannot rule out anterior infarct (age undetermined), but was not felt significantly changed from her EKG on 12/16/07.  Labs reviewed.  Plan to proceed.  Of note, she has an insulin pump.  Prior to her other operative procedures she has turned her rate to half basal.  She plans to do the same for this procedure.  She was told to come to Short Stay early if she has any concerns about hypoglycemia since her procedure is not scheduled until after 1100.

## 2011-02-20 ENCOUNTER — Ambulatory Visit (HOSPITAL_BASED_OUTPATIENT_CLINIC_OR_DEPARTMENT_OTHER): Payer: BC Managed Care – PPO | Admitting: Physician Assistant

## 2011-02-20 ENCOUNTER — Ambulatory Visit: Payer: BC Managed Care – PPO | Admitting: Oncology

## 2011-02-20 VITALS — BP 126/77 | HR 87 | Temp 98.4°F | Ht 65.0 in | Wt 181.5 lb

## 2011-02-20 DIAGNOSIS — C50912 Malignant neoplasm of unspecified site of left female breast: Secondary | ICD-10-CM

## 2011-02-20 DIAGNOSIS — C50919 Malignant neoplasm of unspecified site of unspecified female breast: Secondary | ICD-10-CM

## 2011-02-20 MED ORDER — CEFAZOLIN SODIUM-DEXTROSE 2-3 GM-% IV SOLR
2.0000 g | INTRAVENOUS | Status: AC
  Administered 2011-02-21: 2 g via INTRAVENOUS
  Filled 2011-02-20: qty 50

## 2011-02-20 NOTE — H&P (Signed)
[name]  Description:  41 year old female  02/09/2011 12:30 PM Office Visit Provider:  Ernestene Mention, MD  MRN: [id number] Department:  [facility]            Diagnoses  Reason for Visit    Cancer of left breast - Primary  Other   174.9  discuss br ca sx           Vitals - Last Recorded       BP  Pulse  Temp(Src)  Ht  Wt  BMI    106/70  84  98.4 F (36.9 C) (Temporal)  5\' 5"  (1.651 m)  184 lb 9.6 oz (83.734 kg)  30.72 kg/m2           SpO2               100%                   Progress Notes     Ernestene Mention, MD 02/09/2011 1:39 PM Signed    Subjective:    Patient ID: [name], female DOB: 19-Jun-1970, 41 y.o. MRN: [id number]  HPI  This patient underwent a second image guided biopsy of her left breast at 1:00 position, and is also shows invasive ductal carcinoma. She therefore has multifocal disease in her left breast metastatic to the axilla.  Bilateral breast MRI shows 5 breast masses, consistent with her multifocal disease and abnormal left axillary lymph nodes. There is no evidence of malignancy on the right.  The patient verbally tells me that her genetic testing is negative.  She is scheduled for a PET scan on January 14.  I have discussed her case in breast conference and I have discussed it with Dr. Donnie Coffin. Our advice is to proceed with left modified radical mastectomy and Port-A-Cath insertion. We have advised holding off on prophylactic right mastectomy because of increased wound complications and insulin-dependent diabetic. She is OK with that.  I have discussed her case with Dr. Drinda Butts who will assist with management of her insulin requirements and perioperative necessary.  Review of Systems     Objective:    Physical Exam  Constitutional: patient looks well. She is in no distress.  Neck: no mass.  Lungs: clear to auscultation bilaterally.  Heart: regular rate and rhythm. No murmur. No ectopy.  Breast: vague thickening left breast at 12:00. Obvious mobile left axillary lymph node. Right breast exam unremarkable. Abdomen: is looped pump in place abdominal wall.     Assessment:     Invasive ductal carcinoma left breast, multifocal, metastatic to axillary lymph nodes. Clinical stage TII, N1.  Insulin-dependent diabetes mellitus, managed with insulin pump  Hypertension  Negative genetic testing by verbal report     Plan:     Proceed with scheduling for left modified radical mastectomy and Port-A-Cath insertion.  I have discussed the indications and details of surgery with the patient and her husband. Complications have been outlined, including but not limited to bleeding, infection, skin necrosis, transfusion, arm numbness, arm pain, arm swelling, pneumothorax, port malfunction, and other unforseen problems. She understands these issues. All questions are answered. She agrees with the plan.  Angelia Mould. Derrell Lolling, M.D., [facility]  General and Minimally invasive Surgery  Breast and Colorectal Surgery  Office: [phone]  Pager: [phone]  I have notified Dr. Drinda Butts who has agreed to assist with perioperative insulin  management, if needed.              Not recorded               Pending        Disp  Refills  Start  End    heparin injection 5,000 Units    02/09/2011  02/09/2011    Route: Subcutaneous    Class: Normal    ceFAZolin (ANCEF) 2 g in dextrose 5 % 50 mL IVPB    02/09/2011      Route: Intravenous    Class: Normal                    Patient Instructions     We have found that you have multifocal cancer in your left breast and metastasis to your left axillary lymph nodes. You will be scheduled for left modified radical mastectomy and insertion of Port-A-Cath. Be sure to keep your appt. for your PET scan on January 14.  Total or Modified Radical Mastectomy  Care After  Refer to this sheet in the next few weeks. These  instructions provide you with information on caring for yourself after your procedure. Your caregiver may also give you more specific instructions. Your treatment has been planned according to current medical practices, but problems sometimes occur. Call your caregiver if you have any problems or questions after your procedure.  ACTIVITY  Your caregiver will advise you when you may resume strenuous activities, driving, and sports.  After the drain(s) are removed, you may do light housework. Avoid heavy lifting, carrying, or pushing. You should not be lifting anything heavier than 5 lbs.  Take frequent rest periods. You may tire more easily than usual.  Always rest and elevate the arm affected by your surgery for a period of time equal to your activity time.  Continue doing the exercises given to you by the physical therapist/occupational therapist even after full range of motion has returned. The amount of time this takes will vary from person to person.  After normal range of motion has returned, some stiffness and soreness may persist for 2-3 months. This is normal and will subside.  Begin sports or strenuous activities in moderation. This will give you a chance to rebuild your endurance. Continue to be cautious of heavy lifting or carrying (no more than 10 lbs.) with your affected arm.  You may return to work as recommended by your caregiver.  NUTRITION  You may resume your normal diet.  Make sure you drink plenty of fluids (6-8 glasses a day).  Eat a well-balanced diet. Including daily portions of food from government recommended food groups:  Grains.  Vegetables.  Fruits.  Milk.  Meat & beans.  Oils.  Visit [employer] for more information  HYGIENE  You may wash your hair.  If your incision (cut from surgery) is closed, you may shower or tub bathe, unless instructed otherwise by your doctor.  FEVER  If you feel feverish or have shaking chills, take your temperature. If your  temperature is 102 F (38.9 C) or above, call your caregiver. The fever may mean there is an infection.  If you call early, infection can be treated with antibiotics and hospitalization may be avoided.  PAIN CONTROL  Mild discomfort may occur.  You may need to take an over-the-counter pain medication or a medication prescribed by your caregiver.  Call your caregiver if you experience increased pain.  INCISION CARE  Check your incision daily for  increased redness, drainage, swelling, or separation of skin.  Call your caregiver if any of the above are noted.  ARM AND HAND CARE  If the lymph nodes under your arm were removed with a modified radical mastectomy, there may be a greater tendency for the arm to swell.  Try to avoid having blood pressures taken, blood drawn, or injections given in the affected arm. This is the arm on the same side as the surgery.  Use hand lotion to soften cuticles instead of cutting them to avoid cutting yourself.  Be careful when shaving your under arms. Use an electric shaver if possible. You may use a deodorant after the incision has completely healed. Until then, clean under your arms with hydrogen peroxide.  Use reasonable precaution when cooking, sewing, and gardening to avoid burning or needle or thorn pricks.  Do not weigh your arm straight down with a package or your purse.  Follow the exercises and instructions given to you by the physical therapist/occupational therapist and your caregiver.  FOLLOW-UP APPOINTMENT  Call your caregiver for a follow-up appointment as directed.  PROSTHESIS INFORMATION  Wear your temporary prosthesis (artificial breast) until your caregiver gives you permission to purchase a permanent one. This will depend upon your rate of healing. We suggest you also wait until you are physically and emotionally ready to shop for one. The suitability depends on several individual factors. We do not endorse any particular prosthesis, but suggest  you try several until you are satisfied with appearance and fit. A list of stores may be obtained from your local [employer] at www.cancer.org or 1-800-ACS-2345 (1-205-526-5274).  A permanent prosthesis is medically necessary to restore balance. It is also income tax deductible. Be sure all receipts are marked "surgical". It is not essential to purchase a bra. You may sew a pocket into your regular bra.  Note: Remember to take all of your medical insurance information with you when shopping for your prosthesis.  SELECTING A PROSTHESIS FITTER  You may want to ask the following questions when selecting a fitter:  What styles and brands of forms are carried in stock?  How long have the forms been on the market and have there been any problems with them?  Why would one form be better than another?  How long should a particular form last?  May I wear the form for a trial period without obligation?  Do the forms require a prosthetic bra? If so, what is the price range? Must I always wear that style?  If alterations to the bra are necessary, can they be done at this location or be sent out?  Will I be charged for alterations?  Will I receive suggestions on how to alter my own wardrobe, if necessary?  Will you special order forms or bras if necessary?  Are fitters always available to meet my needs?  What kinds of garments should be worn for the fitting?  Are lounge wear, swim wear, and accessories available?  If I have insurance coverage or Medicare, will you suggest ways for processing the paper work?  Do you keep complete records so that mail reordering is possible?  How are warranty claims handled if I have a problem with the form?  Document Released: 09/09/2003 Document Revised: 09/28/2010 Document Reviewed: 05/14/2007  [facility] Patient Information 2012 [place], [place].  Implanted Port Instructions  An implanted port is a central line that has a round shape and is placed under the  skin. It is used  for long-term IV (intravenous) access for:  Medicine.  Fluids.  Liquid nutrition, such as TPN (total parenteral nutrition).  Blood samples.  Ports can be placed:  In the chest area just below the collarbone (this is the most common place.)  In the arms.  In the belly (abdomen) area.  In the legs.  PARTS OF THE PORT  A port has 2 main parts:  The reservoir. The reservoir is round, disc-shaped, and will be a small, raised area under your skin.  The reservoir is the part where a needle is inserted (accessed) to either give medicines or to draw blood.  The catheter. The catheter is a long, slender tube that extends from the reservoir. The catheter is placed into a large vein.  Medicine that is inserted into the reservoir goes into the catheter and then into the vein.  INSERTION OF THE PORT  The port is surgically placed in either an operating room or in a procedural area (interventional radiology).  Medicine may be given to help you relax during the procedure.  The skin where the port will be inserted is numbed (local anesthetic).  1 or 2 small cuts (incisions) will be made in the skin to insert the port.  The port can be used after it has been inserted.  INCISION SITE CARE  The incision site may have small adhesive strips on it. This helps keep the incision site closed. Sometimes, no adhesive strips are placed. Instead of adhesive strips, a special kind of surgical glue is used to keep the incision closed.  If adhesive strips were placed on the incision sites, do not take them off. They will fall off on their own.  The incision site may be sore for 1 to 2 days. Pain medicine can help.  Do not get the incision site wet. Bathe or shower as directed by your caregiver.  The incision site should heal in 5 to 7 days. A small scar may form after the incision has healed.  ACCESSING THE PORT  Special steps must be taken to access the port:  Before the port is accessed, a numbing  cream can be placed on the skin. This helps numb the skin over the port site.  A sterile technique is used to access the port.  The port is accessed with a needle. Only "non-coring" port needles should be used to access the port. Once the port is accessed, a blood return should be checked. This helps ensure the port is in the vein and is not clogged (clotted).  If your caregiver believes your port should remain accessed, a clear (transparent) bandage will be placed over the needle site. The bandage and needle will need to be changed every week or as directed by your caregiver.  Keep the bandage covering the needle clean and dry. Do not get it wet. Follow your caregiver's instructions on how to take a shower or bath when the port is accessed.  If your port does not need to stay accessed, no bandage is needed over the port.  FLUSHING THE PORT  Flushing the port keeps it from getting clogged. How often the port is flushed depends on:  If a constant infusion is running. If a constant infusion is running, the port may not need to be flushed.  If intermittent medicines are given.  If the port is not being used.  For intermittent medicines:  The port will need to be flushed:  After medicines have been given.  After blood has  been drawn.  As part of routine maintenance.  A port is normally flushed with:  Normal saline.  Heparin.  Follow your caregiver's advice on how often, how much, and the type of flush to use on your port.  IMPORTANT PORT INFORMATION  Tell your caregiver if you are allergic to heparin.  After your port is placed, you will get a manufacturer's information card. The card has information about your port. Keep this card with you at all times.  There are many types of ports available. Know what kind of port you have.  In case of an emergency, it may be helpful to wear a medical alert bracelet. This can help alert health care workers that you have a port.  The port can stay in for as  long as your caregiver believes it is necessary.  When it is time for the port to come out, surgery will be done to remove it. The surgery will be similar to how the port was put in.  If you are in the hospital or clinic:  Your port will be taken care of and flushed by a nurse.  If you are at home:  A home health care nurse may give medicines and take care of the port.  You or a family member can get special training and directions for giving medicine and taking care of the port at home.  SEEK IMMEDIATE MEDICAL CARE IF:  Your port does not flush or you are unable to get a blood return.  New drainage or pus is coming from the incision.  A bad smell is coming from the incision site.  You develop swelling or increased redness at the incision site.  You develop increased swelling or pain at the port site.  You develop swelling or pain in the surrounding skin near the port.  You have an oral temperature above 102 F (38.9 C), not controlled by medicine.  MAKE SURE YOU:  Understand these instructions.  Will watch your condition.  Will get help right away if you are not doing well or get worse.  Document Released: 01/16/2005 Document Revised: 09/28/2010 Document Reviewed: 04/09/2008  [facility] Patient Information 2012 [place], [place].          Level of Service     PR OFFICE OUTPATIENT VISIT 15 MINUTES [99213]           All Flowsheet Templates (all recorded)     Encounter Vitals Flowsheet   Custom Formula Data Flowsheet   Anthropometrics Flowsheet                           Referring Provider          Priscella Mann, MD            All Charges for This Encounter       Code  Description  Service Date  Service Provider  Modifiers  Quantity    [phone]  PR OFFICE OUTPATIENT VISIT 15 MINUTES  02/09/2011  Ernestene Mention, MD   1    [phone]  PR PT VIS [facility] USE EHR CER ATCB  02/09/2011  Ernestene Mention, MD   1    [phone]  PR CURRENT TOBACCO NON-USER  02/09/2011  Ernestene Mention, MD   1                Other Encounter Related Information     Allergies & Medications  Problem List      History      Patient-Entered Questionnaires       No data filed

## 2011-02-20 NOTE — H&P (Addendum)
[name]    MRN: [id number]   Description: 41 year old female  Provider: Ernestene Mention, MD  Department: [facility]        Diagnoses     Cancer of left breast   - Primary    174.9                 History and Physical    Ernestene Mention, MD  Patient ID: [name], female   DOB: 05/21/1970, 41 y.o.   MRN: [id number]    Chief Complaint   Patient presents with   .  Other       Eval - new breast cancer      HPI [name] is a 41 y.o. female.  She is referred by Dr. Norva Pavlov at the [facility] for a newly diagnosed cancer of the left breast, possibly multifocal, and metastatic to left axilla. Her primary care physician is Dr. Lucianne [name] [place]. The patient does not feel any abnormality in either breast. She states that they did a left breast biopsy at [facility] a year ago but it was negative. Recent screening mammograms were performed and abnormalities were seen in the left breast. Diagnostic left mammogram and ultrasound were performed on December 18 and they see at least 5 nodules, all suspicious. Image guided biopsy of a central mass at the 12:00 position reveals invasive ductal carcinoma. Biopsy of a left axillary lymph node is positive for metastatic ductal carcinoma. Breast diagnostic profile is pending.  She is scheduled for MRI on December 26.  She is scheduled for second biopsies on December 27 to see if she has multifocal disease within the breast.  Family history is positive for breast cancer in a maternal aunt who died at age 109. There is no other family history of ovarian or breast cancer.  The patient has significant medical history with insulin dependent diabetes mellitus on insulin pump, followed by Dr. Drinda Butts and hypertension.  She is married. She has 2 sons and one daughter.    ADDENDUIM:  02/20/2011 This patient underwent a second image guided biopsy of her left breast at 1:00 position, and  is also shows invasive ductal carcinoma. She therefore has multifocal disease in her left breast metastatic to the axilla.  Bilateral breast MRI shows 5 breast masses, consistent with her multifocal disease and abnormal left axillary lymph nodes. There is no evidence of malignancy on the right.  The patient verbally tells me that her genetic testing is negative.   PET scan on January 14 shows no distant metastatic disease. I have discussed her case in breast conference and I have discussed it with Dr. Donnie Coffin. Our advice is to proceed with left modified radical mastectomy and Port-A-Cath insertion. We have advised holding off on prophylactic right mastectomy because of increased wound complications and insulin-dependent diabetic. She is OK with that.  I have discussed her case with Dr. Drinda Butts who will assist with management of her insulin requirements and perioperative necessary.      Past Medical History   Diagnosis  Date   .  Breast mass in female         fibrocystic changes   .  Diabetes mellitus         insulin dependant - uses pump   .  Cancer         breast       Past Surgical History   Procedure  Date   .  Bladder tacking  01/2009   .  Shoulder surgery  04/26/10       left   .  Abdominal hysterectomy  2002   .  Tubal ligation  12/1999       Family History   Problem  Relation  Age of Onset   .  Hypertension       .  Diabetes       .  Deep vein thrombosis       .  Kidney disease       .  Cancer  Maternal Aunt         breast   .  Cancer  Maternal Uncle        Social History History   Substance Use Topics   .  Smoking status:  Never Smoker    .  Smokeless tobacco:  Never Used   .  Alcohol Use:  No       Allergies   Allergen  Reactions   .  Hydrocodone-Acetaminophen  Itching and Swelling   .  Lantus  Itching, Nausea And Vomiting and Swelling   .  Nph Iletin I (Insulin Isophane, Mixed Nph)  Itching, Nausea And Vomiting and Swelling       Current Outpatient  Prescriptions   Medication  Sig  Dispense  Refill   .  Insulin Aspart (NOVOLOG Nile)  Inject into the skin.           Marland Kitchen  gabapentin (NEURONTIN) 300 MG capsule  take once a day on day 1, twice a day on day 2, and three times a day thereafter          .  lisinopril (PRINIVIL,ZESTRIL) 10 MG tablet  Take 10 mg by mouth daily. For blood pressure             Review of Systems Review of Systems  Constitutional: Negative for fever, chills and unexpected weight change.  HENT: Negative for hearing loss, congestion, sore throat, trouble swallowing and voice change.   Eyes: Negative for visual disturbance.  Respiratory: Negative for cough and wheezing.   Cardiovascular: Negative for chest pain, palpitations and leg swelling.  Gastrointestinal: Negative for nausea, vomiting, abdominal pain, diarrhea, constipation, blood in stool, abdominal distention and anal bleeding.  Genitourinary: Negative for hematuria, vaginal bleeding and difficulty urinating.  Musculoskeletal: Negative for arthralgias.  Skin: Negative for rash and wound.  Neurological: Negative for seizures, syncope and headaches.  Hematological: Negative for adenopathy. Does not bruise/bleed easily.  Psychiatric/Behavioral: Negative for confusion.    Blood pressure 136/84, pulse 70, temperature 97.9 F (36.6 C), temperature source Temporal, resp. rate 18, height 5\' 5"  (1.651 m), weight 177 lb 12.8 oz (80.65 kg).  Physical Exam  Constitutional: She is oriented to person, place, and time. She appears well-developed and well-nourished. No distress.       Husband is with her throughout the encounter  HENT:   Head: Normocephalic and atraumatic.   Nose: Nose normal.   Mouth/Throat: No oropharyngeal exudate.  Eyes: Conjunctivae and EOM are normal. Pupils are equal, round, and reactive to light. Left eye exhibits no discharge. No scleral icterus.  Neck: Neck supple. No JVD present. No tracheal deviation present. No thyromegaly present.    Cardiovascular: Normal rate, regular rhythm, normal heart sounds and intact distal pulses.    No murmur heard. Pulmonary/Chest: Effort normal and breath sounds normal. No respiratory distress. She has no wheezes. She has no rales. She exhibits no  tenderness.    Abdominal: Soft. Bowel sounds are normal. She exhibits no distension and no mass. There is no tenderness. There is no rebound and no guarding.       Insulin pump in place abdominal wall  Musculoskeletal: She exhibits no edema and no tenderness.  Lymphadenopathy:    She has no cervical adenopathy.  Neurological: She is alert and oriented to person, place, and time. She exhibits normal muscle tone. Coordination normal.  Skin: Skin is warm. No rash noted. She is not diaphoretic. No erythema. No pallor.  Psychiatric: She has a normal mood and affect. Her behavior is normal. Judgment and thought content normal.    Data Reviewed   I reviewed her mammograms, ultrasound, and pathology report. I have referred her to medical oncology and genetic counseling.   ADDENDUM:  MRI shows 5 hypermetabolic masses in left breast and uptake in axilla. Right breast unremarkable.      Assessment   Invasive ductal carcinoma left breast, metastatic to axillary lymph nodes. Clinical stage is probably T2, N1 . This is multifocal within the breast.  Insulin-dependent diabetes mellitus, on insulin pump  Hypertension  Negative genetic testing by verbal report.   Plan   I had a very long talk with the patient and her husband. I told her that we will need to proceed with port a cath insertion, and left modified radical mastectomy.Marland Kitchen   MRI has been completed   Medical oncology opinion has been completed.       Angelia Mould. Derrell Lolling, M.D., [facility] General and Minimally invasive Surgery Breast and Colorectal Surgery Office:   [phone] Pager:   [phone]

## 2011-02-20 NOTE — Progress Notes (Signed)
Hematology and Oncology Follow Up Visit  [name] [id number] 1970/07/31 41 y.o. 02/20/2011    HPI:    [name] is a 41 year old [place] woman with a newly diagnosed locally advanced triple negative left breast carcinoma, due to undergo a left modified radical mastectomy with axillary lymph node dissection under the care Dr. Claud Kelp on 02/21/2011.  #2 Insulin-pump dependent diabetic  Interim History:    [name] is seen today with both her husband and mother and accompaniment, due to her history of a locally advanced triple negative left breast carcinoma with probable axillary lymph node involvement. She has recently undergone staging PET CT which failed to reveal any evidence of distant metastases just known involvement of her left breast and axillary region. She was noted to have a benign appearing 5 cm ovarian cyst. Of note patient has undergone an abdominal hysterectomy in 2002.  She is feeling well, though appropriately anxious about her upcoming surgery scheduled for tomorrow. Otherwise, there she denies fevers chills night sweats shortness of breath chest pain. No nausea or emesis issues. She has undergone chemotherapy teaching class. Of note, she will also have her port placed at the time of her surgery.   A detailed review of systems is otherwise noncontributory as noted below.  Review of Systems: Constitutional:  no weight loss, fever, night sweats and feels well Eyes: No complaints ENT:No complaints Cardiovascular: no chest pain or dyspnea on exertion Respiratory: no cough, shortness of breath, or wheezing Neurological: no TIA or stroke symptoms Dermatological: negative Gastrointestinal: no abdominal pain, change in bowel habits, or black or bloody stools Genito-Urinary: no dysuria, trouble voiding, or hematuria Hematological and Lymphatic: negative Breast: positive for - known L breast mass secondary to cancer Musculoskeletal:  negative Remaining ROS negative.   Medications:   I have reviewed the patient's current medications.  Current Outpatient Prescriptions  Medication Sig Dispense Refill  . gabapentin (NEURONTIN) 300 MG capsule Take 600 mg by mouth 3 (three) times daily. take once a day on day 1, twice a day on day 2, and three times a day thereafter      . Insulin Human (INSULIN PUMP) 100 unit/ml SOLN Inject into the skin continuous.      Marland Kitchen lisinopril (PRINIVIL,ZESTRIL) 10 MG tablet Take 10 mg by mouth daily. For blood pressure        No current facility-administered medications for this visit.   Facility-Administered Medications Ordered in Other Visits  Medication Dose Route Frequency Provider Last Rate Last Dose  . ceFAZolin (ANCEF) IVPB 2 g/50 mL premix  2 g Intravenous 60 min Pre-Op Ernestene Mention, MD        Allergies:  Allergies  Allergen Reactions  . Hydrocodone-Acetaminophen Itching and Swelling  . Lantus Itching, Nausea And Vomiting and Swelling  . Nph Iletin I (Insulin Isophane, Mixed Nph) Itching, Nausea And Vomiting and Swelling    Physical Exam: Filed Vitals:   02/20/11 1452  BP: 126/77  Pulse: 87  Temp: 98.4 F (36.9 C)     Lab Results: Lab Results  Component Value Date   WBC 5.4 02/16/2011   HGB 13.2 02/16/2011   HCT 37.8 02/16/2011   MCV 84.4 02/16/2011   PLT 250 02/16/2011   NEUTROABS 2.1 02/16/2011     Chemistry      Component Value Date/Time   NA 141 02/16/2011 1030   K 4.2 02/16/2011 1030   CL 107 02/16/2011 1030   CO2 27 02/16/2011 1030   BUN 9 02/16/2011  1030   CREATININE 0.71 02/16/2011 1030      Component Value Date/Time   CALCIUM 9.4 02/16/2011 1030   ALKPHOS 89 02/16/2011 1030   AST 22 02/16/2011 1030   ALT 21 02/16/2011 1030   BILITOT 0.5 02/16/2011 1030        Radiological Studies: Chest 2 View  02/16/2011  *RADIOLOGY REPORT*  Clinical Data: Preop breast cancer surgery  CHEST - 2 VIEW  Comparison: 02/13/2011  Findings: The heart size and mediastinal  contours are within normal limits.  Both lungs are clear.  The visualized skeletal structures are unremarkable.  IMPRESSION: Negative exam.  Original Report Authenticated By: Rosealee Albee, M.D.   Mr Breast Bilateral W Wo Contrast  01/25/2011  *RADIOLOGY REPORT*  Clinical Data: Recently diagnosed left breast invasive ductal carcinoma and metastatic left axillary lymph node.  The patient had four additional masses seen in the left breast at the time of initial ultrasound, all suspicious for additional foci of malignancy.  BILATERAL BREAST MRI WITH AND WITHOUT CONTRAST  Technique: Multiplanar, multisequence MR images of both breasts were obtained prior to and following the intravenous administration of 18ml of MultiHance.  Three dimensional images were evaluated at the independent DynaCad workstation.  Comparison:  Recent mammogram, ultrasound and biopsy examinations.  Findings: Mild to moderate background parenchymal enhancement in both breasts.  2.40 x 2.4 x 1.8 cm oval, irregularly marginated mass with peripheral rim enhancement in the central left breast slightly superiorly and slightly medially.  There is a biopsy marker clip artifact adjacent to the anterior, lateral border of this mass. This mass corresponds to the recently biopsied invasive ductal carcinoma and has a mixture of enhancement kinetics, including rapid washin/washout.  4 mm medial to this mass, a second oval, irregularly marginated enhancing mass is demonstrated measuring 1.2 x 1.1 x 1.1 cm in maximum dimensions.  This also has a mixture of enhancement kinetics, including rapid washin/washout.  In the posterior aspect of the upper outer quadrant of the left breast, a 1.3 x 1.2 x 1.1 cm oval, spiculated enhancing mass is demonstrated.  This also has a mixture of enhancement kinetics, including rapid washin/washout.  More posteriorly in the upper outer quadrant of the left breast, a trilobed, elongated mass-like enhancing area is demonstrated  measuring 2.7 x 1.4 x 1.0 cm in maximum dimensions.  This also has a mixture of enhancement kinetics, including rapid washin/washout.  In the anterior aspect of the outer left breast, slightly superiorly, a 0.6 x 0.3 x 0.3 cm mildly irregular, oval enhancing mass is demonstrated.  This also has a mixture of enhancement kinetics, including rapid washin/washout.  An abnormally enlarged left axillary lymph node with an abnormally thickened cortex is demonstrated.  This measures 2.3 x 1.8 cm in maximum dimensions on inversion recovery image number 50.  An increased number of normal sized retropectoral lymph nodes are noted on the left as well as a mildly prominent interpectoral (Rotter's) lymph node measuring 9 x 4 mm in maximum dimensions on inversion recovery image number 55.  No masses or areas of enhancement suspicious for malignancy are seen on the right.  No abnormal appearing internal mammary or right axillary lymph nodes are seen.  Multiple small bilateral breast cysts are noted.  IMPRESSION:  1.  2.4 x 2.4 x 1.8 cm biopsy-proven invasive ductal carcinoma in the 11:30 to 12 o'clock position of the left breast. 2.  Four additional left breast masses, as described above, all suspicious for malignancy.  Ultrasound-guided core needle  biopsy of the 1.3 x 1.2 x 1.1 cm mass in the 12:30 to 1 o'clock position of the left breast is recommended and scheduled for 01/26/2011.  This corresponds to the mass seen in the 1 o'clock position of the breast, 10 cm from the nipple, on the ultrasound dated 01/17/2011. 3.  Biopsy-proven metastatic level I left axillary lymph node. There are additional mildly prominent level II retropectoral lymph nodes on the left as well as a mildly prominent Rotter's node on the left.  These could also potentially be involved with early metastatic disease. 4.  No evidence of malignancy on the right.  THREE-DIMENSIONAL MR IMAGE RENDERING ON INDEPENDENT WORKSTATION:  Three-dimensional MR images were  rendered by post-processing of the original MR data on an independent workstation.  The three- dimensional MR images were interpreted, and findings were reported in the accompanying complete MRI report for this study.  BI-RADS CATEGORY 5:  Highly suggestive of malignancy - appropriate action should be taken.  Recommendation:  Ultrasound guided core needle biopsy of the 1.3 cm mass in the 12:30 to 1 o'clock position of the left breast.  This is scheduled for 01/26/2011.  Original Report Authenticated By: Darrol Angel, M.D.   Nm Pet Image Initial (pi) Skull Base To Thigh  02/13/2011  *RADIOLOGY REPORT*  Clinical Data: Initial treatment strategy for breast cancer. Multifocal  breast cancer of the left breast.  NUCLEAR MEDICINE PET SKULL BASE TO THIGH  Fasting Blood Glucose:  223  Technique:  18.1 mCi F-18 FDG was injected intravenously.   CT data was obtained and used for attenuation correction and anatomic localization only.  (This was not acquired as a diagnostic CT examination.) Additional exam technical data entered  on technologist worksheet.  Comparison: None  Findings:  Neck: No hypermetabolic nodes in the neck.  Chest: There is a hypermetabolic left axillary lymph node measures 20 mm short axis (image 66) with SUV max = 6.3.  Within the posterior left breast, there is a hypermetabolic mass measuring 21 x 17 mm (image 84) with SUV max = 4.3.  No hypermetabolic internal mammary or infraclavicular lymph nodes.  Small soft tissue in the anterior mediastinum is felt to represent thymus.  This does not have significant hypermetabolic activity. No hypermetabolic mediastinal nodes.  No hypermetabolic pulmonary nodules.  Abdomen / Pelvis:No abnormal hypermetabolic activity within the solid organs.  No evidence of abdominal or pelvic hypermetabolic nodes.  There is a low density cystic lesion midline and superior to the uterus measuring 5 cm.  This does not have significant hypermetabolic activity.  Skeleton:No  focal hypermetabolic activity to suggest skeletal metastasis.  IMPRESSION:  1.  Hypermetabolic mass in the posterior left breast consistent with primary breast carcinoma. 2.  Hypermetabolic left axillary lymph node consistent with local metastasis. 3.  No evidence of central metastasis to the infraclavicular or internal mammary nodes. 4.  No evidence of distant metastasis. 5.  A 5 cm cystic lesion superior to the uterus likely represents a benign ovarian cyst.  Recommend pelvic ultrasound for  further characterization.  Original Report Authenticated By: Genevive Bi, M.D.   Assessment:     Patient is a 41 year old [place] woman with a newly diagnosed locally advanced triple-negative breast carcinoma of the left breast without evidence to suggest distant metastatic disease per PET/CT scan.  She is due to undergo a left modified radical mastectomy with axillary node dissection and port placement under the care of Dr. Claud Kelp on 02/21/2011.  Case has  been reviewed by Dr. Darnelle Catalan in Dr. Theron Arista Rubin's absence.     Plan:     Patient will undergo her left modified radical mastectomy with axillary lymph node dissection and port placement is scheduled on 02/21/2011. We will plan on seeing her back on 03/14/2011 for followup and possible initiation of adjuvant chemotherapy. Patient and her family understand and agree with this plan.    This plan was reviewed with the patient, who voices understanding and agreement.  She knows to call with any changes or problems.    Jahquez Steffler T, PA-C 02/20/2011

## 2011-02-21 ENCOUNTER — Ambulatory Visit (HOSPITAL_COMMUNITY): Payer: BC Managed Care – PPO | Admitting: Vascular Surgery

## 2011-02-21 ENCOUNTER — Encounter (HOSPITAL_COMMUNITY)
Admission: RE | Disposition: A | Discharge status: Home / Self Care | Payer: Self-pay | Source: Ambulatory Visit | Attending: General Surgery

## 2011-02-21 ENCOUNTER — Ambulatory Visit (HOSPITAL_COMMUNITY): Payer: BC Managed Care – PPO

## 2011-02-21 ENCOUNTER — Encounter (HOSPITAL_COMMUNITY): Payer: Self-pay | Admitting: *Deleted

## 2011-02-21 ENCOUNTER — Ambulatory Visit: Payer: BC Managed Care – PPO | Admitting: Physician Assistant

## 2011-02-21 ENCOUNTER — Encounter (HOSPITAL_COMMUNITY): Payer: Self-pay | Admitting: Vascular Surgery

## 2011-02-21 ENCOUNTER — Other Ambulatory Visit (INDEPENDENT_AMBULATORY_CARE_PROVIDER_SITE_OTHER): Payer: Self-pay | Admitting: General Surgery

## 2011-02-21 ENCOUNTER — Inpatient Hospital Stay (HOSPITAL_COMMUNITY)
Admission: RE | Admit: 2011-02-21 | Discharge: 2011-02-23 | DRG: 257 | Disposition: A | Discharge status: Home / Self Care | Payer: BC Managed Care – PPO | Source: Ambulatory Visit | Attending: General Surgery | Admitting: General Surgery

## 2011-02-21 DIAGNOSIS — C773 Secondary and unspecified malignant neoplasm of axilla and upper limb lymph nodes: Secondary | ICD-10-CM | POA: Diagnosis present

## 2011-02-21 DIAGNOSIS — Z803 Family history of malignant neoplasm of breast: Secondary | ICD-10-CM

## 2011-02-21 DIAGNOSIS — H269 Unspecified cataract: Secondary | ICD-10-CM | POA: Diagnosis not present

## 2011-02-21 DIAGNOSIS — J984 Other disorders of lung: Secondary | ICD-10-CM | POA: Diagnosis not present

## 2011-02-21 DIAGNOSIS — E109 Type 1 diabetes mellitus without complications: Secondary | ICD-10-CM | POA: Diagnosis present

## 2011-02-21 DIAGNOSIS — E119 Type 2 diabetes mellitus without complications: Secondary | ICD-10-CM | POA: Diagnosis not present

## 2011-02-21 DIAGNOSIS — C50919 Malignant neoplasm of unspecified site of unspecified female breast: Principal | ICD-10-CM | POA: Diagnosis present

## 2011-02-21 DIAGNOSIS — C50912 Malignant neoplasm of unspecified site of left female breast: Secondary | ICD-10-CM | POA: Insufficient documentation

## 2011-02-21 DIAGNOSIS — C50119 Malignant neoplasm of central portion of unspecified female breast: Secondary | ICD-10-CM | POA: Diagnosis not present

## 2011-02-21 DIAGNOSIS — E785 Hyperlipidemia, unspecified: Secondary | ICD-10-CM | POA: Diagnosis present

## 2011-02-21 DIAGNOSIS — Z794 Long term (current) use of insulin: Secondary | ICD-10-CM

## 2011-02-21 DIAGNOSIS — I10 Essential (primary) hypertension: Secondary | ICD-10-CM | POA: Diagnosis present

## 2011-02-21 DIAGNOSIS — Z452 Encounter for adjustment and management of vascular access device: Secondary | ICD-10-CM | POA: Diagnosis not present

## 2011-02-21 DIAGNOSIS — Z9641 Presence of insulin pump (external) (internal): Secondary | ICD-10-CM | POA: Diagnosis not present

## 2011-02-21 DIAGNOSIS — E1065 Type 1 diabetes mellitus with hyperglycemia: Secondary | ICD-10-CM

## 2011-02-21 HISTORY — PX: PORTACATH PLACEMENT: SHX2246

## 2011-02-21 HISTORY — PX: MASTECTOMY MODIFIED RADICAL: SUR848

## 2011-02-21 HISTORY — PX: OTHER SURGICAL HISTORY: SHX169

## 2011-02-21 LAB — CBC
HCT: 31.3 % — ABNORMAL LOW (ref 36.0–46.0)
Hemoglobin: 11.1 g/dL — ABNORMAL LOW (ref 12.0–15.0)
MCH: 30 pg (ref 26.0–34.0)
MCHC: 35.5 g/dL (ref 30.0–36.0)

## 2011-02-21 LAB — GLUCOSE, CAPILLARY
Glucose-Capillary: 157 mg/dL — ABNORMAL HIGH (ref 70–99)
Glucose-Capillary: 198 mg/dL — ABNORMAL HIGH (ref 70–99)
Glucose-Capillary: 176 mg/dL — ABNORMAL HIGH (ref 70–99)

## 2011-02-21 SURGERY — MASTECTOMY, MODIFIED RADICAL
Anesthesia: General | Site: Chest | Wound class: Clean

## 2011-02-21 MED ORDER — LISINOPRIL 10 MG PO TABS
10.0000 mg | ORAL_TABLET | Freq: Every day | ORAL | Status: DC
  Administered 2011-02-22 – 2011-02-23 (×2): 10 mg via ORAL
  Filled 2011-02-21 (×3): qty 1

## 2011-02-21 MED ORDER — 0.9 % SODIUM CHLORIDE (POUR BTL) OPTIME
TOPICAL | Status: DC | PRN
  Administered 2011-02-21: 1000 mL

## 2011-02-21 MED ORDER — GABAPENTIN 300 MG PO CAPS
300.0000 mg | ORAL_CAPSULE | Freq: Three times a day (TID) | ORAL | Status: DC
  Administered 2011-02-21 – 2011-02-23 (×5): 300 mg via ORAL
  Filled 2011-02-21 (×8): qty 1

## 2011-02-21 MED ORDER — HEPARIN SODIUM (PORCINE) 5000 UNIT/ML IJ SOLN
5000.0000 [IU] | Freq: Three times a day (TID) | INTRAMUSCULAR | Status: DC
  Administered 2011-02-22 – 2011-02-23 (×2): 5000 [IU] via SUBCUTANEOUS
  Filled 2011-02-21 (×5): qty 1

## 2011-02-21 MED ORDER — MORPHINE SULFATE 2 MG/ML IJ SOLN: 2.0000 mg | INTRAMUSCULAR | Status: DC | PRN

## 2011-02-21 MED ORDER — INSULIN PUMP: SUBCUTANEOUS | Status: DC

## 2011-02-21 MED ORDER — GABAPENTIN 300 MG PO CAPS: 600.0000 mg | ORAL_CAPSULE | Freq: Three times a day (TID) | ORAL | Status: DC

## 2011-02-21 MED ORDER — NALOXONE HCL 0.4 MG/ML IJ SOLN
INTRAMUSCULAR | Status: DC | PRN
  Administered 2011-02-21: 20 ug via INTRAVENOUS

## 2011-02-21 MED ORDER — GLYCOPYRROLATE 0.2 MG/ML IJ SOLN
INTRAMUSCULAR | Status: DC | PRN
  Administered 2011-02-21: .5 mg via INTRAVENOUS

## 2011-02-21 MED ORDER — HEPARIN SOD (PORK) LOCK FLUSH 100 UNIT/ML IV SOLN
INTRAVENOUS | Status: DC | PRN
  Administered 2011-02-21: 300 [IU]

## 2011-02-21 MED ORDER — OXYCODONE-ACETAMINOPHEN 5-325 MG PO TABS
ORAL_TABLET | ORAL | Status: AC
  Filled 2011-02-21: qty 2

## 2011-02-21 MED ORDER — INSULIN PUMP
SUBCUTANEOUS | Status: AC
  Filled 2011-02-21: qty 1

## 2011-02-21 MED ORDER — HYDROMORPHONE HCL PF 1 MG/ML IJ SOLN
INTRAMUSCULAR | Status: AC
  Filled 2011-02-21: qty 1

## 2011-02-21 MED ORDER — PROPOFOL 10 MG/ML IV BOLUS
INTRAVENOUS | Status: DC | PRN
  Administered 2011-02-21: 200 mg via INTRAVENOUS

## 2011-02-21 MED ORDER — FENTANYL CITRATE 0.05 MG/ML IJ SOLN
INTRAMUSCULAR | Status: DC | PRN
  Administered 2011-02-21: 100 ug via INTRAVENOUS
  Administered 2011-02-21 (×2): 50 ug via INTRAVENOUS
  Administered 2011-02-21 (×2): 100 ug via INTRAVENOUS
  Administered 2011-02-21: 50 ug via INTRAVENOUS

## 2011-02-21 MED ORDER — ONDANSETRON HCL 4 MG PO TABS: 4.0000 mg | ORAL_TABLET | Freq: Four times a day (QID) | ORAL | Status: DC | PRN

## 2011-02-21 MED ORDER — NEOSTIGMINE METHYLSULFATE 1 MG/ML IJ SOLN
INTRAMUSCULAR | Status: DC | PRN
  Administered 2011-02-21: 4 mg via INTRAVENOUS

## 2011-02-21 MED ORDER — MORPHINE SULFATE 2 MG/ML IJ SOLN
INTRAMUSCULAR | Status: AC
  Administered 2011-02-21: 2 mg via INTRAVENOUS
  Filled 2011-02-21: qty 1

## 2011-02-21 MED ORDER — CEFAZOLIN SODIUM-DEXTROSE 2-3 GM-% IV SOLR
2.0000 g | Freq: Three times a day (TID) | INTRAVENOUS | Status: AC
  Administered 2011-02-21 – 2011-02-22 (×3): 2 g via INTRAVENOUS
  Filled 2011-02-21 (×3): qty 50

## 2011-02-21 MED ORDER — ROCURONIUM BROMIDE 100 MG/10ML IV SOLN
INTRAVENOUS | Status: DC | PRN
  Administered 2011-02-21: 40 mg via INTRAVENOUS

## 2011-02-21 MED ORDER — LACTATED RINGERS IV SOLN
INTRAVENOUS | Status: DC | PRN
  Administered 2011-02-21: 11:00:00 via INTRAVENOUS

## 2011-02-21 MED ORDER — POTASSIUM CHLORIDE 2 MEQ/ML IV SOLN
INTRAVENOUS | Status: DC
  Administered 2011-02-21 – 2011-02-22 (×3): via INTRAVENOUS
  Filled 2011-02-21 (×4): qty 1000

## 2011-02-21 MED ORDER — HEPARIN SODIUM (PORCINE) 5000 UNIT/ML IJ SOLN
5000.0000 [IU] | Freq: Once | INTRAMUSCULAR | Status: AC
  Administered 2011-02-21: 5000 [IU] via SUBCUTANEOUS
  Filled 2011-02-21: qty 1

## 2011-02-21 MED ORDER — BUPIVACAINE-EPINEPHRINE 0.5% -1:200000 IJ SOLN
INTRAMUSCULAR | Status: DC | PRN
  Administered 2011-02-21: 4 mL

## 2011-02-21 MED ORDER — HYDROMORPHONE HCL PF 1 MG/ML IJ SOLN
0.2500 mg | INTRAMUSCULAR | Status: DC | PRN
  Administered 2011-02-21 (×5): 0.5 mg via INTRAVENOUS

## 2011-02-21 MED ORDER — ONDANSETRON HCL 4 MG/2ML IJ SOLN: 4.0000 mg | Freq: Once | INTRAMUSCULAR | Status: DC | PRN

## 2011-02-21 MED ORDER — MIDAZOLAM HCL 5 MG/5ML IJ SOLN
INTRAMUSCULAR | Status: DC | PRN
  Administered 2011-02-21: 2 mg via INTRAVENOUS

## 2011-02-21 MED ORDER — LACTATED RINGERS IV SOLN
INTRAVENOUS | Status: DC
Start: 2011-02-21 — End: 2011-02-21
  Administered 2011-02-21: 11:00:00 via INTRAVENOUS

## 2011-02-21 MED ORDER — OXYCODONE-ACETAMINOPHEN 5-325 MG PO TABS
1.0000 | ORAL_TABLET | ORAL | Status: DC | PRN
  Administered 2011-02-21: 2 via ORAL
  Administered 2011-02-22 (×2): 1 via ORAL
  Filled 2011-02-21: qty 2
  Filled 2011-02-21 (×2): qty 1

## 2011-02-21 MED ORDER — PHENYLEPHRINE HCL 10 MG/ML IJ SOLN
INTRAMUSCULAR | Status: DC | PRN
  Administered 2011-02-21: 80 ug via INTRAVENOUS
  Administered 2011-02-21: 40 ug via INTRAVENOUS

## 2011-02-21 MED ORDER — ONDANSETRON HCL 4 MG/2ML IJ SOLN
4.0000 mg | Freq: Four times a day (QID) | INTRAMUSCULAR | Status: DC | PRN
  Administered 2011-02-21: 4 mg via INTRAVENOUS
  Filled 2011-02-21: qty 2

## 2011-02-21 MED ORDER — SODIUM CHLORIDE 0.9 % IR SOLN
Status: DC | PRN
  Administered 2011-02-21: 13:00:00

## 2011-02-21 MED ORDER — ONDANSETRON HCL 4 MG/2ML IJ SOLN
INTRAMUSCULAR | Status: DC | PRN
  Administered 2011-02-21: 4 mg via INTRAVENOUS

## 2011-02-21 MED ORDER — HETASTARCH-ELECTROLYTES 6 % IV SOLN
INTRAVENOUS | Status: DC | PRN
  Administered 2011-02-21: 12:00:00 via INTRAVENOUS

## 2011-02-21 SURGICAL SUPPLY — 81 items
APPLIER CLIP 11 MED OPEN (CLIP)
APPLIER CLIP 9.375 MED OPEN (MISCELLANEOUS) ×3
BAG DECANTER FOR FLEXI CONT (MISCELLANEOUS) ×3 IMPLANT
BENZOIN TINCTURE PRP APPL 2/3 (GAUZE/BANDAGES/DRESSINGS) ×3 IMPLANT
BINDER BREAST LRG (GAUZE/BANDAGES/DRESSINGS) IMPLANT
BINDER BREAST XLRG (GAUZE/BANDAGES/DRESSINGS) IMPLANT
BLADE SURG 10 STRL SS (BLADE) ×3 IMPLANT
BLADE SURG 15 STRL LF DISP TIS (BLADE) ×2 IMPLANT
BLADE SURG 15 STRL SS (BLADE) ×1
BLADE SURG ROTATE 9660 (MISCELLANEOUS) IMPLANT
BNDG COHESIVE 6X5 TAN STRL LF (GAUZE/BANDAGES/DRESSINGS) ×3 IMPLANT
CANISTER SUCTION 2500CC (MISCELLANEOUS) ×3 IMPLANT
CHLORAPREP W/TINT 26ML (MISCELLANEOUS) ×3 IMPLANT
CLIP APPLIE 11 MED OPEN (CLIP) IMPLANT
CLIP APPLIE 9.375 MED OPEN (MISCELLANEOUS) ×2 IMPLANT
CLOTH BEACON ORANGE TIMEOUT ST (SAFETY) ×3 IMPLANT
CONT SPEC 4OZ CLIKSEAL STRL BL (MISCELLANEOUS) ×3 IMPLANT
COVER PROBE W GEL 5X96 (DRAPES) IMPLANT
COVER SURGICAL LIGHT HANDLE (MISCELLANEOUS) ×3 IMPLANT
CRADLE DONUT ADULT HEAD (MISCELLANEOUS) ×3 IMPLANT
DECANTER SPIKE VIAL GLASS SM (MISCELLANEOUS) ×6 IMPLANT
DERMABOND ADHESIVE PROPEN (GAUZE/BANDAGES/DRESSINGS)
DERMABOND ADVANCED .7 DNX6 (GAUZE/BANDAGES/DRESSINGS) IMPLANT
DRAIN CHANNEL 19F RND (DRAIN) ×3 IMPLANT
DRAPE C-ARM 42X72 X-RAY (DRAPES) ×3 IMPLANT
DRAPE LAPAROSCOPIC ABDOMINAL (DRAPES) ×6 IMPLANT
DRSG ADAPTIC 3X8 NADH LF (GAUZE/BANDAGES/DRESSINGS) ×9 IMPLANT
DRSG OPSITE 6X11 MED (GAUZE/BANDAGES/DRESSINGS) IMPLANT
DRSG PAD ABDOMINAL 8X10 ST (GAUZE/BANDAGES/DRESSINGS) ×3 IMPLANT
ELECT BLADE 4.0 EZ CLEAN MEGAD (MISCELLANEOUS) ×3
ELECT CAUTERY BLADE 6.4 (BLADE) ×3 IMPLANT
ELECT REM PT RETURN 9FT ADLT (ELECTROSURGICAL) ×3
ELECTRODE BLDE 4.0 EZ CLN MEGD (MISCELLANEOUS) ×2 IMPLANT
ELECTRODE REM PT RTRN 9FT ADLT (ELECTROSURGICAL) ×2 IMPLANT
EVACUATOR SILICONE 100CC (DRAIN) ×3 IMPLANT
GAUZE SPONGE 4X4 16PLY XRAY LF (GAUZE/BANDAGES/DRESSINGS) ×3 IMPLANT
GLOVE BIOGEL PI IND STRL 6 (GLOVE) ×2 IMPLANT
GLOVE BIOGEL PI INDICATOR 6 (GLOVE) ×1
GLOVE EUDERMIC 7 POWDERFREE (GLOVE) ×3 IMPLANT
GOWN PREVENTION PLUS XLARGE (GOWN DISPOSABLE) ×3 IMPLANT
GOWN STRL NON-REIN LRG LVL3 (GOWN DISPOSABLE) ×6 IMPLANT
INTRODUCER 13FR (MISCELLANEOUS) IMPLANT
INTRODUCER COOK 11FR (CATHETERS) IMPLANT
KIT BASIN OR (CUSTOM PROCEDURE TRAY) ×3 IMPLANT
KIT PORT POWER ISP 8FR (Catheter) IMPLANT
KIT POWER CATH 8FR (Catheter) ×3 IMPLANT
KIT ROOM TURNOVER OR (KITS) ×3 IMPLANT
NEEDLE 18GX1X1/2 (RX/OR ONLY) (NEEDLE) IMPLANT
NEEDLE 22X1 1/2 (OR ONLY) (NEEDLE) ×3 IMPLANT
NEEDLE HYPO 25GX1X1/2 BEV (NEEDLE) ×3 IMPLANT
NS IRRIG 1000ML POUR BTL (IV SOLUTION) ×3 IMPLANT
PACK GENERAL/GYN (CUSTOM PROCEDURE TRAY) ×3 IMPLANT
PACK SURGICAL SETUP 50X90 (CUSTOM PROCEDURE TRAY) ×3 IMPLANT
PAD ARMBOARD 7.5X6 YLW CONV (MISCELLANEOUS) ×6 IMPLANT
PENCIL BUTTON HOLSTER BLD 10FT (ELECTRODE) ×3 IMPLANT
SET INTRODUCER 12FR PACEMAKER (SHEATH) IMPLANT
SET SHEATH INTRODUCER 10FR (MISCELLANEOUS) IMPLANT
SHEATH COOK PEEL AWAY SET 9F (SHEATH) IMPLANT
SPECIMEN JAR X LARGE (MISCELLANEOUS) ×3 IMPLANT
SPONGE GAUZE 4X4 12PLY (GAUZE/BANDAGES/DRESSINGS) ×3 IMPLANT
SPONGE LAP 18X18 X RAY DECT (DISPOSABLE) ×6 IMPLANT
STAPLER VISISTAT 35W (STAPLE) ×3 IMPLANT
STRIP CLOSURE SKIN 1/2X4 (GAUZE/BANDAGES/DRESSINGS) ×6 IMPLANT
SURGILUBE 3G PEEL PACK STRL (MISCELLANEOUS) IMPLANT
SUT ETHILON 3 0 FSL (SUTURE) ×6 IMPLANT
SUT MNCRL AB 3-0 PS2 18 (SUTURE) ×3 IMPLANT
SUT MNCRL AB 4-0 PS2 18 (SUTURE) ×3 IMPLANT
SUT PROLENE 2 0 CT2 30 (SUTURE) ×3 IMPLANT
SUT SILK 2 0 FS (SUTURE) IMPLANT
SUT VIC AB 3-0 SH 18 (SUTURE) ×6 IMPLANT
SUT VIC AB 3-0 SH 27 (SUTURE)
SUT VIC AB 3-0 SH 27XBRD (SUTURE) IMPLANT
SYR 20ML ECCENTRIC (SYRINGE) ×6 IMPLANT
SYR 5ML LUER SLIP (SYRINGE) ×3 IMPLANT
SYR BULB 3OZ (MISCELLANEOUS) ×3 IMPLANT
SYR CONTROL 10ML LL (SYRINGE) ×3 IMPLANT
TOWEL OR 17X24 6PK STRL BLUE (TOWEL DISPOSABLE) ×3 IMPLANT
TOWEL OR 17X26 10 PK STRL BLUE (TOWEL DISPOSABLE) ×6 IMPLANT
TUBE CONNECTING 12X1/4 (SUCTIONS) IMPLANT
WATER STERILE IRR 1000ML POUR (IV SOLUTION) IMPLANT
YANKAUER SUCT BULB TIP NO VENT (SUCTIONS) IMPLANT

## 2011-02-21 NOTE — Interval H&P Note (Signed)
History and Physical Interval Note:  02/21/2011 11:06 AM  [name]  has presented today for surgery, with the diagnosis of Left breast cancer.  The goals of treatment and the various methods of treatment have been discussed with the patient and family. After consideration of risks, benefits and other options for treatment, the patient has consented to  Procedure(s): LEFT  MASTECTOMY MODIFIED RADICAL  and INSERTION PORT-A-CATH as a surgical intervention .  The patients' history has been reviewed today, the patient examined, there is no change in status, she is stable for surgery.  I have reviewed the patients' chart and labs.  Questions were answered to the patient's satisfaction.     Ernestene Mention 02/21/2011 11:07 AM

## 2011-02-21 NOTE — Progress Notes (Signed)
Dr. Lucianne [name] notified of BS 208

## 2011-02-21 NOTE — Preoperative (Signed)
Beta Blockers   Reason not to administer Beta Blockers:Not Applicable 

## 2011-02-21 NOTE — Op Note (Signed)
Patient Name:           [name]   Date of Surgery:        02/21/2011  Pre op Diagnosis:      Multifocal, invasive ductal carcinoma left breast with axillary lymph node metastasis, clinical stage T2,N2  Post op Diagnosis:    same  Procedure:                 Insertion of 8 French power port venous vascular access device with fluoroscopic guidance. Left modified radical mastectomy  Surgeon:                     Angelia Mould. Derrell Lolling, M.D., FACS  Assistant:                      Manus Rudd, M.D., [facility]  Operative Indications:   This is a 41 year old African American female with insulin-dependent diabetes mellitus and hypertension. Recent screening mammograms showed multiple abnormalities in the left breast. The radiologist saw at least 5 nodules, all suspicious and suspicious left axillary lymph nodes. Image guided biopsy of the central mass at 12:00 position revealed invasive ductal carcinoma. Biopsy of a left axillary lymph node was positive for metastatic ductal carcinoma. A second biopsy of another nodule left breast at the 1:00 position showed invasive ductal carcinoma, confirming multifocal disease. MRI showed 5 breast masses in the left. Right breast was normal by MRI. The patient has apparently had genetic testing which is negative. We have discussed her care in the multidisciplinary conference. The decision was to proceed with left modified radical mastectomy and Port-A-Cath insertion to facilitate postop adjuvant chemotherapy. She is on insulin pump which is being managed by Dr. Maryelizabeth Kaufmann.  Operative Findings:       The Port-A-Cath was placed in the right subclavian vein and the tip of the catheter appears to be in the superior vena cava just at the right atrial junction. The catheter flushes easily and has excellent blood return. The left breast looked grossly normal. There were multiple matted lymph nodes in the left axilla which required very tedious dissection to clean out the  level I and level II lymph nodes. We did not leave any gross adenopathy behind. At the completion of the case both the thoracodorsal nerve and the long thoracic nerve were functioning normally.  Procedure in Detail:          Following the induction of general endotracheal anesthesia, intravenous antibiotics were given. Surgical time out was performed. The patient was positioned with her arms at her sides and  a small roll behind her shoulders. The neck and chest were prepped and draped in a sterile fashion. 0.5% Marcaine with epinephrine was used as a local infiltration anesthetic. A right subclavian venipuncture was performed with excellent blood return. A guidewire was inserted into the superior vena cava under fluoroscopic guidance. Using the C-arm we drew a template on the chest wall so as to position the catheter tip at the junction of the superior vena cava and right atrium. Once we had confirmed the guidewire was in good position we drawn the template, I made a small incision at the wire insertion site. I then made a 3 cm incision in the right infraclavicular area, slightly inferior and medial to the wire insertion site. I debrided a little bit of subcutaneous fat and took the  dissection down to the pectoralis fascia. Using a tunneling I drew the catheter from the  wire insertion site to the port pocket site. Using the template I measured that the catheter should be cut at 22.5 cm. The catheter was then secured the port with the locking device and the port and catheter flushed with heparinized saline. The port was sutured to the pectoralis fascia with 3 interrupted sutures of 2-0 Prolene. We flushed the catheter one more time. We inserted the dilator and peel-away sheath over the guidewire into the central venous circulation. This was a little tight under the  clavicle but it went just fine. The guidewire and dilator were removed. The catheter was threaded into the peel-away sheath and the peel-away sheath  removed. We had excellent blood return and flushed the catheter easily. Fluoroscopy confirmed that the catheter was positioned well with the tip in the superior vena cava just above the right atrium. There was no deformity of the catheter anywhere along its course. The port and catheter were flushed with heparinized saline. The subcutaneous tissue was closed with interrupted sutures of 3-0 Vicryl and the skin incision closed with subcuticular sutures of 4-0 Monocryl and Dermabond.  We then took all the drapes off, removed the roll behind the shoulders and brought both arms out to her side to expose the axilla. The left breast and left axilla and neck were then prepped and draped in a sterile fashion. A second surgical time out was performed. Using a marking pen I drew a transverse elliptical incision for the mastectomy. Once I was satisfied with the incision planning I made the elliptical incision with the knife. Skin flaps were raised superiorly to just below the clavicle, medially to the parasternal area, inferiorly to the anterior rectus sheath and laterally to just medial to the anterior edge of the latissimus dorsi muscle. The breast tissue was then dissected off of the pectoralis major and pectoralis minor muscles with electrocautery. I marked the lateral skin margin with a silk suture. I took the dissection up into the axilla. I divided the clavi- pectoral fascia mobilizing the axillary contents away from the lateral border of the pectoralis major and minor muscles. Small venous tributaries and sensory nerves were controlled with hemoclips and divided. I took the dissection all the way up into the apex of the axilla were identified the axillary vein. We took all the level I and level II lymph nodes out. Multiple lymph nodes were matted together and were grossly abnormal. At the completion of this dissection the thoracodorsal nerve and the long thoracic nerve were functioning. After removing the breast and  axillary contents I inspected the axilla. I felt no other grossly abnormal lymph nodes. The wounds were irrigated with saline copiously. Hemostasis was excellent and achieved with electrocautery as well as a few metal clips and some of the venous territories. Two 19- [place] Blake drains were placed, one up into the axilla and one across the skin flaps. These were brought through separate stab incisions inferolaterally, connected to suction bulbs and sutured in with nylon sutures. Inspection confirmed that the wound  appeared very hemostatic. The subcutaneous tissues were closed with multiple interrupted sutures of 3-0 Vicryl. The skin was closed with a running subcuticular suture of 4-0 Monocryl and Steri-Strips. I placed Adaptic gauze, 4 x 4 gauze ,ABDs and a breast binder. The patient tolerated the procedure well taken to recovery in stable condition. EBL 125 cc total, complications none, counts correct,.     Angelia Mould. Derrell Lolling, M.D., FACS General and Minimally Invasive Surgery Breast and Colorectal Surgery  02/21/2011 2:09  PM

## 2011-02-21 NOTE — Anesthesia Postprocedure Evaluation (Signed)
Anesthesia Post Note  Patient: [name]  Procedure(s) Performed:  MASTECTOMY MODIFIED RADICAL; INSERTION PORT-A-CATH  Anesthesia type: general  Patient location: PACU  Post pain: Pain level controlled  Post assessment: Patient's Cardiovascular Status Stable  Last Vitals:  Filed Vitals:   02/21/11 1600  BP:   Pulse: 60  Temp:   Resp: 10    Post vital signs: Reviewed and stable  Level of consciousness: sedated  Complications: No apparent anesthesia complications

## 2011-02-21 NOTE — Anesthesia Preprocedure Evaluation (Signed)
Anesthesia Evaluation  Patient identified by MRN, date of birth, ID band Patient awake    Reviewed: Allergy & Precautions, H&P , NPO status , Patient's Chart, lab work & pertinent test results  History of Anesthesia Complications (+) AWARENESS UNDER ANESTHESIA  Airway Mallampati: I TM Distance: >3 FB Neck ROM: full    Dental  (+) Teeth Intact   Pulmonary neg pulmonary ROS,    Pulmonary exam normal       Cardiovascular hypertension, regular Normal    Neuro/Psych PSYCHIATRIC DISORDERS Negative Neurological ROS     GI/Hepatic negative GI ROS, Neg liver ROS,   Endo/Other  Diabetes mellitus-, Type 1, Insulin Dependent  Renal/GU negative Renal ROS  Genitourinary negative   Musculoskeletal   Abdominal   Peds  Hematology negative hematology ROS (+)   Anesthesia Other Findings   Reproductive/Obstetrics                           Anesthesia Physical Anesthesia Plan  ASA: III  Anesthesia Plan: General ETT   Post-op Pain Management:    Induction: Intravenous  Airway Management Planned: Oral ETT  Additional Equipment:   Intra-op Plan:   Post-operative Plan:   Informed Consent: I have reviewed the patients History and Physical, chart, labs and discussed the procedure including the risks, benefits and alternatives for the proposed anesthesia with the patient or authorized representative who has indicated his/her understanding and acceptance.     Plan Discussed with: Anesthesiologist, CRNA and Surgeon  Anesthesia Plan Comments:         Anesthesia Quick Evaluation

## 2011-02-21 NOTE — Transfer of Care (Signed)
Immediate Anesthesia Transfer of Care Note  Patient: [name]  Procedure(s) Performed:  MASTECTOMY MODIFIED RADICAL; INSERTION PORT-A-CATH  Patient Location: PACU  Anesthesia Type: General  Level of Consciousness: awake  Airway & Oxygen Therapy: Patient Spontanous Breathing and Patient connected to face mask oxygen  Post-op Assessment: Report given to PACU RN and Post -op Vital signs reviewed and stable  Post vital signs: Reviewed and stable  Complications: No apparent anesthesia complications

## 2011-02-21 NOTE — Consult Note (Addendum)
Reason for Consult:diabetes management in a patient on insulin pump Referring Physician: Tynleigh Birt [name] is an 41 y.o. female.  HPI: the patient has known type 1 diabetes since 2002 being treated with insulin pump.  She was last seen in December 2012 in the office in the following insulin pump settings were recommended:basal rate: Basal rate: Midnight = 1.. 4 a.m =. 2.0. 6 a.m. = 1.5. 9 a.m. =2.7. 1 p.m. = 2.4. and 9 p.m. = 1.6. Carbohydrate coverage 1: 15 and sensitivity 1: 34 with target 120. Insulin on board 4 hours. The patient usually has somewhat labile blood sugars with tendency to hypoglycemia at times.  On her last office visit her blood sugars were ranging from 47-471 and her basal rates were readjusted.  She however has overall high blood sugars with A1c over 8% usually   Past Medical History  Diagnosis Date  . Breast mass in female     fibrocystic changes  . Cancer     breast  . Hyperlipidemia   . Nausea   . Hypertension   . Nausea & vomiting     for approx. 2 weeks  . Cataracts, bilateral   . Diabetes mellitus     insulin dependant - uses pump, Dr. Lucianne [name]    Past Surgical History  Procedure Date  . Bladder tacking 01/2009  . Shoulder surgery 04/26/10    left  . Abdominal hysterectomy 2002  . Tubal ligation 12/1999    Family History  Problem Relation Age of Onset  . Hypertension    . Diabetes    . Deep vein thrombosis    . Kidney disease    . Cancer Maternal Aunt     breast  . Cancer Maternal Uncle     Social History:  reports that she has never smoked. She has never used smokeless tobacco. She reports that she does not drink alcohol or use illicit drugs.  Allergies:  Allergies  Allergen Reactions  . Hydrocodone-Acetaminophen Itching and Swelling  . Lantus Itching, Nausea And Vomiting and Swelling  . Nph Iletin I (Insulin Isophane, Mixed Nph) Itching, Nausea And Vomiting and Swelling    Medications:  Prior to Admission:  Prescriptions  prior to admission  Medication Sig Dispense Refill  . gabapentin (NEURONTIN) 300 MG capsule Take 600 mg by mouth 3 (three) times daily. take once a day on day 1, twice a day on day 2, and three times a day thereafter      . Insulin Human (INSULIN PUMP) 100 unit/ml SOLN Inject into the skin continuous.      Marland Kitchen lisinopril (PRINIVIL,ZESTRIL) 10 MG tablet Take 10 mg by mouth daily. For blood pressure         Results for orders placed during the hospital encounter of 02/21/11 (from the past 48 hour(s))  GLUCOSE, CAPILLARY     Status: Abnormal   Collection Time   02/21/11 10:01 AM      Component Value Range Comment   Glucose-Capillary 195 (*) 70 - 99 (mg/dL)   GLUCOSE, CAPILLARY     Status: Abnormal   Collection Time   02/21/11  2:25 PM      Component Value Range Comment   Glucose-Capillary 157 (*) 70 - 99 (mg/dL)    Comment 1 Notify RN       No results found.  Review of Systems  Constitutional: Negative.   Cardiovascular:       Hypertension  Neurological:       Peripheral neuropathy  treated with gabapentin   Blood pressure 121/76, pulse 58, temperature 96.5 F (35.8 C), temperature source Oral, resp. rate 16, SpO2 100.00%. Physical Exam: Not indicated  Assessment/Plan: The patient is currently back on her insulin pump with resumption of her usual basal rate.  She was previously on a half basal rate for the surgery and is fairly stable with glucose of 157.  He will need to continue her current rates and also bolus for high readings or any carbohydrate intake as before.  Will continue to follow   [facility] 02/21/2011, 2:57 PM   Glucose 208 and ptv sedated somewhat; will increase temp basal 10% for 4 hrs.

## 2011-02-22 ENCOUNTER — Encounter: Payer: Self-pay | Admitting: Family Medicine

## 2011-02-22 ENCOUNTER — Encounter (HOSPITAL_COMMUNITY): Payer: Self-pay | Admitting: General Surgery

## 2011-02-22 LAB — GLUCOSE, CAPILLARY

## 2011-02-22 MED ORDER — INSULIN PUMP
Freq: Three times a day (TID) | SUBCUTANEOUS | Status: DC
  Administered 2011-02-22: 2.1 via SUBCUTANEOUS
  Administered 2011-02-23: 1.6 via SUBCUTANEOUS
  Administered 2011-02-23: 3.5 via SUBCUTANEOUS
  Filled 2011-02-22: qty 1

## 2011-02-22 MED ORDER — SENNOSIDES-DOCUSATE SODIUM 8.6-50 MG PO TABS
1.0000 | ORAL_TABLET | Freq: Two times a day (BID) | ORAL | Status: DC
  Administered 2011-02-22 – 2011-02-23 (×3): 1 via ORAL
  Filled 2011-02-22 (×3): qty 1

## 2011-02-22 NOTE — Progress Notes (Signed)
Subjective: Feels better. LAST CBG 123 AT 2 AM, NO LOWS. NAUSEA LAST NIGHT  Objective: Vital signs in last 24 hours: Temp:  [96.1 F (35.6 C)-98.7 F (37.1 C)] 98.3 F (36.8 C) (01/23 0601) Pulse Rate:  [56-87] 75  (01/23 0601) Resp:  [10-21] 20  (01/23 0601) BP: (95-155)/(47-88) 119/74 mmHg (01/23 0601) SpO2:  [96 %-100 %] 99 % (01/23 0601) Weight:  [82.328 kg (181 lb 8 oz)] 82.328 kg (181 lb 8 oz) (01/22 1620) Weight change:  Last BM Date: 02/20/11  Intake/Output from previous day: 01/22 0701 - 01/23 0700 In: 2380 [P.O.:480; I.V.:1400; IV Piggyback:500] Out: 1090 [Urine:750; Drains:240; Blood:100] Intake/Output this shift:      Lab Results:  Basename 02/21/11 1748  WBC 10.2  HGB 11.1*  HCT 31.3*  PLT 258   BMET  Basename 02/21/11 1748  NA --  K --  CL --  CO2 --  GLUCOSE --  BUN --  CREATININE 0.72  CALCIUM --    Studies/Results: Dg Chest Port 1 View  02/21/2011  *RADIOLOGY REPORT*  Clinical Data: Port-A-Cath placement  PORTABLE CHEST - 1 VIEW  Comparison: Chest x-ray of 02/16/2011  Findings:  The lungs are not well aerated.  A right-sided power port Port-A-Cath is present with the tip in the region of the right atrium. Some air is noted in the soft tissues of the left axilla. Heart size is stable.  Thoracic scoliosis is stable.  IMPRESSION: Right-sided Port-A-Cath tip in right atrium.  No pneumothorax. Diminished aeration.  Original Report Authenticated By: Juline Patch, M.D.   Dg Fluoro Guide Cv Line-no Report  02/21/2011  CLINICAL DATA: Port-a-cath   FLOURO GUIDE CV LINE  Fluoroscopy was utilized by the requesting physician.  No radiographic  interpretation.      Medications:  I have reviewed the patient's current medications. Prior to Admission:  Prescriptions prior to admission  Medication Sig Dispense Refill  . gabapentin (NEURONTIN) 300 MG capsule Take 300 mg by mouth 3 (three) times daily.      . Insulin Human (INSULIN PUMP) 100 unit/ml SOLN  Inject into the skin continuous.      Marland Kitchen lisinopril (PRINIVIL,ZESTRIL) 10 MG tablet Take 10 mg by mouth daily. For blood pressure        Scheduled:   .  ceFAZolin (ANCEF) IV  2 g Intravenous 60 min Pre-Op  .  ceFAZolin (ANCEF) IV  2 g Intravenous Q8H  . gabapentin  300 mg Oral TID  . heparin  5,000 Units Subcutaneous Once  . heparin  5,000 Units Subcutaneous Q8H  . HYDROmorphone      . HYDROmorphone      . lisinopril  10 mg Oral Daily  . morphine      . senna-docusate  1 tablet Oral BID  . DISCONTD: gabapentin  600 mg Oral TID  . DISCONTD: oxyCODONE-acetaminophen       Continuous:   . insulin pump 2.44 mL/hr at 02/21/11 1720  . lactated ringers with kcl 50 mL/hr at 02/22/11 0620  . DISCONTD: insulin pump    . DISCONTD: lactated ringers 50 mL/hr at 02/21/11 1106    Assessment/Plan: CONTINUE PUMP SETTINGS AS SUCH; SHE WILL BOLUS FOR MEALS/HIGH SUGARS PER PUMP   LOS: 1 day   Amandalee Lacap 02/22/2011, 7:59 AM

## 2011-02-22 NOTE — Progress Notes (Signed)
1 Day Post-Op  Subjective: Alert and stable. She has been nauseated but does not have much pain. No emesis. She has ambulated the bathroom but not much more. No respiratory problems.  I appreciate Dr. Lucianne [name] adjusting her insulin pump. CBG's down to 123 now. This seems stable.  Both drains seen to be functioning well.  Chest x-ray looks fine. Port-A-Cath position looks good, with tip at the junction of the SVC and right atrium. No pneumothorax.  Objective: Vital signs in last 24 hours: Temp:  [96.1 F (35.6 C)-98.7 F (37.1 C)] 98.5 F (36.9 C) (01/23 0228) Pulse Rate:  [56-87] 77  (01/23 0228) Resp:  [10-21] 18  (01/23 0228) BP: (95-155)/(47-88) 95/47 mmHg (01/23 0228) SpO2:  [96 %-100 %] 98 % (01/23 0228) Weight:  [181 lb 8 oz (82.328 kg)] 181 lb 8 oz (82.328 kg) (01/22 1620) Last BM Date: 02/20/11  Intake/Output from previous day: 01/22 0701 - 01/23 0700 In: 2380 [P.O.:480; I.V.:1400; IV Piggyback:500] Out: 1090 [Urine:750; Drains:240; Blood:100] Intake/Output this shift: Total I/O In: 360 [P.O.:360] Out: 885 [Urine:750; Drains:135]  General appearance: alert friendly, husband present. . Does not appear to be any distress. Resp:         clear to auscultation bilaterally Breasts:     left mastectomy wound looks good. Skin flaps are viable and warm. No skin necrosis. No hematoma. Drainage serosanguineous, both drains are functioning. She does have a little bit of numbness along her upper triceps muscle skin,   no arm swelling  Lab Results:  Results for orders placed during the hospital encounter of 02/21/11 (from the past 24 hour(s))  GLUCOSE, CAPILLARY     Status: Abnormal   Collection Time   02/21/11 10:01 AM      Component Value Range   Glucose-Capillary 195 (*) 70 - 99 (mg/dL)  GLUCOSE, CAPILLARY     Status: Abnormal   Collection Time   02/21/11  2:25 PM      Component Value Range   Glucose-Capillary 157 (*) 70 - 99 (mg/dL)   Comment 1 Notify RN    GLUCOSE,  CAPILLARY     Status: Abnormal   Collection Time   02/21/11  4:38 PM      Component Value Range   Glucose-Capillary 208 (*) 70 - 99 (mg/dL)   Comment 1 Documented in Chart     Comment 2 Notify RN    GLUCOSE, CAPILLARY     Status: Abnormal   Collection Time   02/21/11  5:32 PM      Component Value Range   Glucose-Capillary 212 (*) 70 - 99 (mg/dL)   Comment 1 Documented in Chart     Comment 2 Notify RN    CBC     Status: Abnormal   Collection Time   02/21/11  5:48 PM      Component Value Range   WBC 10.2  4.0 - 10.5 (K/uL)   RBC 3.70 (*) 3.87 - 5.11 (MIL/uL)   Hemoglobin 11.1 (*) 12.0 - 15.0 (g/dL)   HCT 40.9 (*) 81.1 - 46.0 (%)   MCV 84.6  78.0 - 100.0 (fL)   MCH 30.0  26.0 - 34.0 (pg)   MCHC 35.5  30.0 - 36.0 (g/dL)   RDW 91.4  78.2 - 95.6 (%)   Platelets 258  150 - 400 (K/uL)  CREATININE, SERUM     Status: Normal   Collection Time   02/21/11  5:48 PM      Component Value Range  Creatinine, Ser 0.72  0.50 - 1.10 (mg/dL)   GFR calc non Af Amer >90  >90 (mL/min)   GFR calc Af Amer >90  >90 (mL/min)  GLUCOSE, CAPILLARY     Status: Abnormal   Collection Time   02/21/11  6:39 PM      Component Value Range   Glucose-Capillary 209 (*) 70 - 99 (mg/dL)   Comment 1 Documented in Chart     Comment 2 Notify RN    GLUCOSE, CAPILLARY     Status: Abnormal   Collection Time   02/21/11  7:46 PM      Component Value Range   Glucose-Capillary 198 (*) 70 - 99 (mg/dL)   Comment 1 Documented in Chart     Comment 2 Notify RN    GLUCOSE, CAPILLARY     Status: Abnormal   Collection Time   02/21/11  9:43 PM      Component Value Range   Glucose-Capillary 176 (*) 70 - 99 (mg/dL)   Comment 1 Documented in Chart     Comment 2 Notify RN    GLUCOSE, CAPILLARY     Status: Abnormal   Collection Time   02/22/11  2:25 AM      Component Value Range   Glucose-Capillary 123 (*) 70 - 99 (mg/dL)   Comment 1 Documented in Chart     Comment 2 Notify RN       Studies/Results: @RISRSLT24 @     .   ceFAZolin (ANCEF) IV  2 g Intravenous 60 min Pre-Op  .  ceFAZolin (ANCEF) IV  2 g Intravenous Q8H  . gabapentin  300 mg Oral TID  . heparin  5,000 Units Subcutaneous Once  . heparin  5,000 Units Subcutaneous Q8H  . HYDROmorphone      . HYDROmorphone      . lisinopril  10 mg Oral Daily  . morphine      . DISCONTD: gabapentin  600 mg Oral TID  . DISCONTD: oxyCODONE-acetaminophen         Assessment/Plan: s/p Procedure(s): MASTECTOMY LEFT  MODIFIED RADICAL INSERTION PORT-A-CATH  Status post left modified radical mastectomy Port-A-Cath insertion. Stable without obvious complications.  Insulin-dependent diabetes mellitus, on insulin pump, good control   She is not ready for discharge yet. Suspect she will be ready for discharge tomorrow.  Try to advance diet and activities. Teach drain care. Await Pathology.   LOS: 1 day    Aracelie Addis M. Derrell Lolling, M.D., [facility] General and Minimally invasive Surgery Breast and Colorectal Surgery Office:   [phone] Pager:   [phone]  02/22/2011  . .prob

## 2011-02-22 NOTE — Progress Notes (Signed)
Pt s/p mastectomy.  Currently managing CBGs with insulin pump.  Dr. Lucianne [name] following.    Pt A&O x4 and able to manage insulin pump independently.  Has extra supplies at bedside.    Basal rates are as follows: 12AM  1.4 units/hr   4AM   2.0    6AM   2.45   9AM   2.7   1PM  2.4   3PM  2.45   9PM  1.6  Carb coverage factor is 1 unit for every 15 grams carbs consumed  Correction factor is 1 unit for every 34 mg/dl above her target CBG of 120 mg/dl  Target CBG 956 mg/dl  Spoke with Clayborne Dana, RN caring for pt today.  Reviewed charting of insulin pump with RN.  Will follow.

## 2011-02-22 NOTE — Progress Notes (Signed)
UR of chart complete.  

## 2011-02-23 LAB — GLUCOSE, CAPILLARY
Glucose-Capillary: 266 mg/dL — ABNORMAL HIGH (ref 70–99)
Glucose-Capillary: 225 mg/dL — ABNORMAL HIGH (ref 70–99)
Glucose-Capillary: 239 mg/dL — ABNORMAL HIGH (ref 70–99)
Glucose-Capillary: 98 mg/dL (ref 70–99)

## 2011-02-23 MED ORDER — HYDROCODONE-ACETAMINOPHEN 5-325 MG PO TABS: 1.0000 | ORAL_TABLET | ORAL | Status: AC | PRN

## 2011-02-23 NOTE — Discharge Summary (Signed)
Patient ID: [name] [id number] 41 y.o. 09/09/1970  02/21/2011  Discharge date and time: No discharge date for patient encounter.  Admitting Physician: Ernestene Mention  Discharge Physician: Ernestene Mention  Admission Diagnoses: left breast cancer  Discharge Diagnoses: invasive ductal carcinoma left breast, multifocal, clinical stage T2,N2, ER negative, PR negative, HER-2-negative. Final pathology evaluation pending.  Operations: Procedure(s): MASTECTOMY MODIFIED RADICAL INSERTION PORT-A-CATH  Admission Condition: good  Discharged Condition: good  Indication for Admission:  This is a 41 year old African American female with insulin-dependent diabetes mellitus and hypertension. Recent screening mammograms showed multiple abnormalities in the left breast. The radiologist saw at least 5 nodules, all suspicious and suspicious left axillary lymph nodes. Image guided biopsy of the central mass at 12:00 position revealed invasive ductal carcinoma. Biopsy of a left axillary lymph node was positive for metastatic ductal carcinoma. This is a triple negative tumor.  A second biopsy of another nodule left breast at the 1:00 position showed invasive ductal carcinoma, confirming multifocal disease. MRI showed 5 breast masses in the left. Right breast was normal by MRI. The patient has apparently had genetic testing which is negative. We have discussed her care in the multidisciplinary conference. The decision was to proceed with left modified radical mastectomy and Port-A-Cath insertion to facilitate postop adjuvant chemotherapy. She is on insulin pump which is being managed by Dr. Maryelizabeth Kaufmann.   Hospital Course: on the day of admission the patient underwent left modified radical mastectomy, and insertion of a Port-A-Cath through the right subclavian vein. Postop chest x-ray looked fine. Findings at the time of surgery were a basically normal breast but extensive axillary adenopathy with  multiple matted abnormal lymph nodes. All gross tumor was removed from the left axilla.pathology report is pending at this time.  Postoperatively the patient's diabetes was managed with her subcutaneous insulin pump. Dr. Lucianne [name] assisted and modified the pump infusion  Based on CBG's.   Basically the patient is very knowledgeable about this and adjusts her pump accordingly. On postop day 1 the patient was still somewhat nauseated and had not been ambulating so we  decided to keep her one more day. She had no problems with wounds or drains.  On postoperative day 2 the patient felt well and was ready to go home. She had tolerated diet and was ambulatory and had minimal pain and  Had learned how to take care of her drains. Exam revealed that the mastectomy skin flaps were healthy and viable and without any fluid collections or hematoma. Vital signs were stable.  She was given a prescription for Vicodin for pain.  She was instructed in drain and wound care. She was instructed to return to see me in the office in one week for a wound check.  She has an appointment to see Dr. Magdalene River 12.  Consults: Dr. Lucianne [name]; endocrinology  Significant Diagnostic Studies: pathology  Treatments: surgery: left modified radical mastectomy, Port-A-Cath insertion  Disposition: Home  Patient Instructions:   Terri, [name]  Home Medication Instructions FAO:[id number]   Printed on:02/23/11 0731  Medication Information                    lisinopril (PRINIVIL,ZESTRIL) 10 MG tablet Take 10 mg by mouth daily. For blood pressure            Insulin Human (INSULIN PUMP) 100 unit/ml SOLN Inject into the skin continuous.           gabapentin (NEURONTIN) 300 MG capsule Take 300 mg  by mouth 3 (three) times daily.           HYDROcodone-acetaminophen (NORCO) 5-325 MG per tablet Take 1-2 tablets by mouth every 4 (four) hours as needed for pain.             Activity: activity as tolerated.wound careas  instructed. See details. Diet: diabetic diet Wound Care: as directed  Follow-up:  With Dr. Derrell Lolling in 8 days.  Signed: Angelia Mould. Derrell Lolling, M.D., FACS General and minimally invasive surgery Breast and Colorectal Surgery  02/23/2011, 7:31 AM

## 2011-02-23 NOTE — Progress Notes (Signed)
2 Days Post-Op  Subjective: Feels much better. Her nausea resolved. Tolerating diet. Ambulating in halls. Minimal pain.  CABGs fluctuating. Patient is bolusing herself for meals, as usual.  Objective: Vital signs in last 24 hours: Temp:  [98.2 F (36.8 C)-98.7 F (37.1 C)] 98.2 F (36.8 C) (01/24 0523) Pulse Rate:  [83-95] 83  (01/24 0523) Resp:  [18-20] 18  (01/24 0523) BP: (121-134)/(68-82) 126/78 mmHg (01/24 0523) SpO2:  [98 %-100 %] 100 % (01/24 0523) Last BM Date: 02/20/11  Intake/Output from previous day: 01/23 0701 - 01/24 0700 In: 1169.4 [P.O.:480; I.V.:689.4] Out: 1590 [Urine:1400; Drains:190] Intake/Output this shift:    General appearance: alert Breasts:  left mastectomy skin flaps are warm and viable. No necrosis. No fluid collections. Both drains functioning normally with serosanguineous drainage.  Lab Results:  Results for orders placed during the hospital encounter of 02/21/11 (from the past 24 hour(s))  GLUCOSE, CAPILLARY     Status: Normal   Collection Time   02/22/11  8:07 AM      Component Value Range   Glucose-Capillary 70  70 - 99 (mg/dL)  GLUCOSE, CAPILLARY     Status: Normal   Collection Time   02/22/11  1:25 PM      Component Value Range   Glucose-Capillary 75  70 - 99 (mg/dL)   Comment 1 Notify RN    GLUCOSE, CAPILLARY     Status: Abnormal   Collection Time   02/22/11  5:08 PM      Component Value Range   Glucose-Capillary 266 (*) 70 - 99 (mg/dL)   Comment 1 Notify RN    GLUCOSE, CAPILLARY     Status: Abnormal   Collection Time   02/22/11  9:14 PM      Component Value Range   Glucose-Capillary 225 (*) 70 - 99 (mg/dL)  GLUCOSE, CAPILLARY     Status: Abnormal   Collection Time   02/23/11  2:08 AM      Component Value Range   Glucose-Capillary 239 (*) 70 - 99 (mg/dL)     Studies/Results: @RISRSLT24 @     .  ceFAZolin (ANCEF) IV  2 g Intravenous Q8H  . gabapentin  300 mg Oral TID  . heparin  5,000 Units Subcutaneous Q8H  . insulin  pump   Subcutaneous TID AC, HS, 0200  . lisinopril  10 mg Oral Daily  . senna-docusate  1 tablet Oral BID     Assessment/Plan: s/p Procedure(s): MASTECTOMY MODIFIED RA  Doing well postop. Ready for discharge. Discharge home today.  Prescription for Vicodin given.  Return to my office in one week for wound and drain check.  We'll leave drains in until drainage from each drain is less than 10 cc per day.  We will check pathology and call this patient. She is aware of that.  She has an appointment to see Dr. Donnie Coffin on February 12.   Angelia Mould. Derrell Lolling, M.D., [facility] General and Minimally invasive Surgery Breast and Colorectal Surgery Office:   [phone] Pager:   [phone]   LOS: 2 days    Ernestene Mention 02/23/2011  . .prob

## 2011-02-23 NOTE — Progress Notes (Signed)
Discharge home. Home discharge instruction given, no questions verbalized. 

## 2011-02-23 NOTE — Progress Notes (Signed)
Subjective: Glucose up from 5p-10p in 200 range despite correction boluses; fbs pending  Objective: Vital signs in last 24 hours: Temp:  [98.2 F (36.8 C)-98.7 F (37.1 C)] 98.2 F (36.8 C) (01/24 0523) Pulse Rate:  [83-95] 83  (01/24 0523) Resp:  [18-20] 18  (01/24 0523) BP: (121-134)/(68-82) 126/78 mmHg (01/24 0523) SpO2:  [98 %-100 %] 100 % (01/24 0523) Weight change:  Last BM Date: 02/20/11  Intake/Output from previous day: 01/23 0701 - 01/24 0700 In: 1169.4 [P.O.:480; I.V.:689.4] Out: 1590 [Urine:1400; Drains:190] Intake/Output this shift:      Lab Results:  Basename 02/21/11 1748  WBC 10.2  HGB 11.1*  HCT 31.3*  PLT 258   BMET  Basename 02/21/11 1748  NA --  K --  CL --  CO2 --  GLUCOSE --  BUN --  CREATININE 0.72  CALCIUM --    Studies/Results: Dg Chest Port 1 View  02/21/2011  *RADIOLOGY REPORT*  Clinical Data: Port-A-Cath placement  PORTABLE CHEST - 1 VIEW  Comparison: Chest x-ray of 02/16/2011  Findings:  The lungs are not well aerated.  A right-sided power port Port-A-Cath is present with the tip in the region of the right atrium. Some air is noted in the soft tissues of the left axilla. Heart size is stable.  Thoracic scoliosis is stable.  IMPRESSION: Right-sided Port-A-Cath tip in right atrium.  No pneumothorax. Diminished aeration.  Original Report Authenticated By: Juline Patch, M.D.   Dg Fluoro Guide Cv Line-no Report  02/21/2011  CLINICAL DATA: Port-a-cath   FLOURO GUIDE CV LINE  Fluoroscopy was utilized by the requesting physician.  No radiographic  interpretation.      Medications:  I have reviewed the patient's current medications. Scheduled:   .  ceFAZolin (ANCEF) IV  2 g Intravenous Q8H  . gabapentin  300 mg Oral TID  . heparin  5,000 Units Subcutaneous Q8H  . insulin pump   Subcutaneous TID AC, HS, 0200  . lisinopril  10 mg Oral Daily  . senna-docusate  1 tablet Oral BID   Continuous:   . lactated ringers with kcl 50 mL/hr at  02/22/11 2119    Assessment/Plan: Increase 3pm basal to 2.6 and f/u in 6 weeks  LOS: 2 days   [facility] 02/23/2011, 7:49 AM

## 2011-02-27 ENCOUNTER — Telehealth (INDEPENDENT_AMBULATORY_CARE_PROVIDER_SITE_OTHER): Payer: Self-pay | Admitting: General Surgery

## 2011-02-27 ENCOUNTER — Telehealth (INDEPENDENT_AMBULATORY_CARE_PROVIDER_SITE_OTHER): Payer: Self-pay

## 2011-02-27 NOTE — Telephone Encounter (Signed)
Message copied by Joanette Gula on Mon Feb 27, 2011  3:57 PM ------      Message from: Ernestene Mention      Created: Mon Feb 27, 2011  3:35 PM       I called patient and discussed path report.                  ----- Message -----         From: Joanette Gula, LPN         Sent: 02/27/2011   1:16 PM           To: Ernestene Mention, MD            I just pulled up the path on pt. Please review pts path and call her with result. She has several positive nodes. Phone # [phone]. Pt is coming in this week to have Dr Magnus Ivan check her drains. Thank You

## 2011-02-27 NOTE — Telephone Encounter (Signed)
I called the patient today to see how she was doing and to discuss the pathology report. She states she was doing fine from the surgery. She has an appointment to be seen in our office on January 30 for a wound check.  Her pathology report shows 5 separate invasive ductal carcinomas of her breast, the largest being 2.4 cm. Margins are clean. 7/14 lymph nodes had metastatic cancer. This is a triple negative tumor with the Ki-67 of 78%. Pathologic stage is T2, N2a. .  I discussed this with her. She has an appointment to see Dr. Donnie Coffin on February 12.

## 2011-03-01 ENCOUNTER — Ambulatory Visit (INDEPENDENT_AMBULATORY_CARE_PROVIDER_SITE_OTHER): Payer: BC Managed Care – PPO | Admitting: Surgery

## 2011-03-01 ENCOUNTER — Encounter (INDEPENDENT_AMBULATORY_CARE_PROVIDER_SITE_OTHER): Payer: Self-pay | Admitting: Surgery

## 2011-03-01 VITALS — BP 126/78 | HR 70 | Temp 97.9°F | Resp 18 | Ht 65.0 in | Wt 182.0 lb

## 2011-03-01 DIAGNOSIS — Z09 Encounter for follow-up examination after completed treatment for conditions other than malignant neoplasm: Secondary | ICD-10-CM

## 2011-03-01 NOTE — Progress Notes (Signed)
Subjective:     Patient ID: [name], female   DOB: May 28, 1970, 41 y.o.   MRN: [id number]  HPI This is a patient of Dr. Jacinto Halim.  She is here for her first postoperative visit status post left mastectomy and complete axillary dissection as well as Port-A-Cath insertion. She is doing well and has no complaints. Her drains are draining minimal fluid. The one in the axilla is draining slightly more than the one in the chest.  Review of Systems     Objective:   Physical Exam On exam, her incision is healing well without evidence of infection. I removed the mastectomy drain. Both drains had serosanguineous fluid. The port site is healing well. I discussed the pathology with them which Dr. Derrell Lolling had also done.    Assessment:     Patient status post left mastectomy and axillary dissection and Port-A-Cath insertion    Plan:     She will come back and see Dr. Derrell Lolling next week

## 2011-03-02 ENCOUNTER — Other Ambulatory Visit: Payer: Self-pay | Admitting: Oncology

## 2011-03-02 ENCOUNTER — Other Ambulatory Visit: Payer: Self-pay | Admitting: Family Medicine

## 2011-03-02 DIAGNOSIS — C50419 Malignant neoplasm of upper-outer quadrant of unspecified female breast: Secondary | ICD-10-CM

## 2011-03-07 ENCOUNTER — Ambulatory Visit (HOSPITAL_COMMUNITY)
Admission: RE | Admit: 2011-03-07 | Discharge: 2011-03-07 | Disposition: A | Discharge status: Home / Self Care | Payer: BC Managed Care – PPO | Source: Ambulatory Visit | Attending: Oncology | Admitting: Oncology

## 2011-03-07 DIAGNOSIS — I079 Rheumatic tricuspid valve disease, unspecified: Secondary | ICD-10-CM | POA: Insufficient documentation

## 2011-03-07 DIAGNOSIS — I10 Essential (primary) hypertension: Secondary | ICD-10-CM | POA: Insufficient documentation

## 2011-03-07 DIAGNOSIS — E785 Hyperlipidemia, unspecified: Secondary | ICD-10-CM | POA: Insufficient documentation

## 2011-03-07 DIAGNOSIS — Z9010 Acquired absence of unspecified breast and nipple: Secondary | ICD-10-CM | POA: Insufficient documentation

## 2011-03-07 DIAGNOSIS — Z01818 Encounter for other preprocedural examination: Secondary | ICD-10-CM | POA: Insufficient documentation

## 2011-03-07 DIAGNOSIS — C50419 Malignant neoplasm of upper-outer quadrant of unspecified female breast: Secondary | ICD-10-CM

## 2011-03-07 DIAGNOSIS — E119 Type 2 diabetes mellitus without complications: Secondary | ICD-10-CM | POA: Insufficient documentation

## 2011-03-07 DIAGNOSIS — C50919 Malignant neoplasm of unspecified site of unspecified female breast: Secondary | ICD-10-CM | POA: Insufficient documentation

## 2011-03-07 NOTE — Progress Notes (Signed)
2D Echo completed.  Tanyon Alipio Johnson, RDCS 

## 2011-03-08 ENCOUNTER — Other Ambulatory Visit: Payer: Self-pay | Admitting: *Deleted

## 2011-03-09 ENCOUNTER — Ambulatory Visit (INDEPENDENT_AMBULATORY_CARE_PROVIDER_SITE_OTHER): Payer: BC Managed Care – PPO | Admitting: General Surgery

## 2011-03-09 ENCOUNTER — Encounter: Payer: Self-pay | Admitting: *Deleted

## 2011-03-09 ENCOUNTER — Ambulatory Visit: Payer: BC Managed Care – PPO | Admitting: *Deleted

## 2011-03-09 ENCOUNTER — Encounter (INDEPENDENT_AMBULATORY_CARE_PROVIDER_SITE_OTHER): Payer: Self-pay | Admitting: General Surgery

## 2011-03-09 VITALS — BP 130/82 | HR 72 | Temp 98.0°F | Resp 18 | Ht 65.0 in | Wt 177.2 lb

## 2011-03-09 DIAGNOSIS — Z9889 Other specified postprocedural states: Secondary | ICD-10-CM

## 2011-03-09 NOTE — Patient Instructions (Signed)
Your left mastectomy wound is healing without any complication. There is no infection or fluid. The drainage is still too much to remove the drains so we're going to leave that in place. I gave you a copy of the pathology report. Return to see me in one week. Keep her left shoulder moving it.

## 2011-03-09 NOTE — Progress Notes (Signed)
Subjective:     Patient ID: [name], female   DOB: 02-28-1970, 41 y.o.   MRN: [id number]  HPI This 41 year old insulin-dependent diabetic returns for postop check. On February 21, 2011 she underwent left modified radical mastectomy and insertion of Port-A-Cath.  Final pathology report shows a 5 separate invasive cancers in the left breast, largest being 2.4 cm. 7/14 lymph nodes are positive. Margins negative.  Triple negative tumor. Pathologic stage T2, N2a.  She is going to see Dr. Donnie Coffin on February 12.  She is doing well. She has no fever. No wound problems. She is moving her shoulder slump. No pain.  Review of Systems     Objective:   Physical Exam Patient looks well. In no distress. Port site [place].  Left mastectomy wound is healing uneventfully. No retained fluid. No skin necrosis. No sign of infection. Tissues are soft. Axillary drain is in place draining serosanguineous fluid. It still draining 20-30 cc per day.    Assessment:     Multifocal invasive cancer left breast, pathologic stage T2, N2a, triple negative tumor.  Recovering uneventfully following left modified radical mastectomy and Port-A-Cath placement.    Plan:     We are going to leave the drain in place. She will return to see me in one week.  I gave her a copy of her pathology report and discussed that with her.  Anticipate that she will initiate adjuvant chemotherapy within a month, to be followed later by adjuvant radiation therapy to the chest wall and axilla.  Refer to PT after drain removed.  Angelia Mould. Derrell Lolling, M.D., [facility] General and Minimally invasive Surgery Breast and Colorectal Surgery Office:   [phone] Pager:   [phone]

## 2011-03-14 ENCOUNTER — Other Ambulatory Visit: Payer: BC Managed Care – PPO | Admitting: Lab

## 2011-03-14 ENCOUNTER — Other Ambulatory Visit: Payer: Self-pay | Admitting: *Deleted

## 2011-03-14 ENCOUNTER — Ambulatory Visit: Payer: BC Managed Care – PPO

## 2011-03-14 ENCOUNTER — Ambulatory Visit: Payer: BC Managed Care – PPO | Admitting: Physician Assistant

## 2011-03-14 DIAGNOSIS — C50912 Malignant neoplasm of unspecified site of left female breast: Secondary | ICD-10-CM

## 2011-03-15 ENCOUNTER — Ambulatory Visit: Payer: BC Managed Care – PPO

## 2011-03-16 ENCOUNTER — Ambulatory Visit (INDEPENDENT_AMBULATORY_CARE_PROVIDER_SITE_OTHER): Payer: BC Managed Care – PPO | Admitting: General Surgery

## 2011-03-16 ENCOUNTER — Encounter (INDEPENDENT_AMBULATORY_CARE_PROVIDER_SITE_OTHER): Payer: Self-pay | Admitting: General Surgery

## 2011-03-16 VITALS — BP 112/76 | HR 68 | Temp 96.9°F | Resp 18 | Ht 65.0 in | Wt 180.8 lb

## 2011-03-16 DIAGNOSIS — Z9889 Other specified postprocedural states: Secondary | ICD-10-CM

## 2011-03-16 NOTE — Patient Instructions (Signed)
Your left mastectomy wound is healing without any complication or infection at this time. We removed your last drain today.   You will be referred to physical therapy.  Keep your appt. with Dr. Donnie Coffin  Return to see me in 3 weeks.

## 2011-03-16 NOTE — Progress Notes (Addendum)
Subjective:     Patient ID: [name], female   DOB: July 20, 1970, 41 y.o.   MRN: [id number]  HPI This patient underwent left modified radical mastectomy Port-A-Cath placement on January 22. She had 5 separate cancers in her breast and 7/14 lymph nodes positive. The tumor was triple negative. Pathologic stage T2, N2a.  She still has one drain but the drainage is down to about 12 cc a day. She has an appt. see Dr. Donnie Coffin on February 19.  Review of Systems     Objective:   Physical Exam Patient looks well. She is in no distress.  Left mastectomy wound is healing uneventfully. The skin flaps are healthy. No infection. They appear to be adherent to the chest wall. I removed the drain. Insulin pump noted left flank.    Assessment:     Triple negative invasive ductal carcinoma left breast, pathologic stage T2, N2a., triple negative tumor.  Status post left modified radical mastectomy Port-A-Cath placement, recovering uneventfully without obvious couple occasions today.    Plan:     Since the last drain was removed today, she'll be referred to physical therapy for range of motion exercises left shoulder.  I told her that she could shower  See Dr. Donnie Coffin February 19  Return to see me 3 weeks. an.     Angelia Mould. Derrell Lolling, M.D., [facility] General and Minimally invasive Surgery Breast and Colorectal Surgery Office:   [phone] Pager:   [phone]

## 2011-03-21 ENCOUNTER — Other Ambulatory Visit (HOSPITAL_BASED_OUTPATIENT_CLINIC_OR_DEPARTMENT_OTHER): Payer: BC Managed Care – PPO

## 2011-03-21 ENCOUNTER — Encounter: Payer: Self-pay | Admitting: Physician Assistant

## 2011-03-21 ENCOUNTER — Telehealth: Payer: Self-pay | Admitting: *Deleted

## 2011-03-21 ENCOUNTER — Ambulatory Visit (HOSPITAL_BASED_OUTPATIENT_CLINIC_OR_DEPARTMENT_OTHER): Payer: BC Managed Care – PPO | Admitting: Physician Assistant

## 2011-03-21 VITALS — BP 129/82 | HR 75 | Temp 98.4°F | Ht 65.0 in | Wt 181.2 lb

## 2011-03-21 DIAGNOSIS — C50919 Malignant neoplasm of unspecified site of unspecified female breast: Secondary | ICD-10-CM

## 2011-03-21 DIAGNOSIS — Z171 Estrogen receptor negative status [ER-]: Secondary | ICD-10-CM | POA: Diagnosis not present

## 2011-03-21 DIAGNOSIS — C50419 Malignant neoplasm of upper-outer quadrant of unspecified female breast: Secondary | ICD-10-CM

## 2011-03-21 DIAGNOSIS — C50912 Malignant neoplasm of unspecified site of left female breast: Secondary | ICD-10-CM

## 2011-03-21 LAB — CBC WITH DIFFERENTIAL/PLATELET
EOS%: 5.2 % (ref 0.0–7.0)
MCH: 29.3 pg (ref 25.1–34.0)
MCV: 82.5 fL (ref 79.5–101.0)
MONO%: 4.7 % (ref 0.0–14.0)
RBC: 4.16 10*6/uL (ref 3.70–5.45)
RDW: 12.7 % (ref 11.2–14.5)
nRBC: 0 % (ref 0–0)

## 2011-03-21 LAB — COMPREHENSIVE METABOLIC PANEL
ALT: 35 U/L (ref 0–35)
Alkaline Phosphatase: 121 U/L — ABNORMAL HIGH (ref 39–117)
Creatinine, Ser: 0.77 mg/dL (ref 0.50–1.10)
Glucose, Bld: 162 mg/dL — ABNORMAL HIGH (ref 70–99)
Sodium: 139 mEq/L (ref 135–145)
Total Bilirubin: 0.7 mg/dL (ref 0.3–1.2)
Total Protein: 6.4 g/dL (ref 6.0–8.3)

## 2011-03-21 NOTE — Progress Notes (Signed)
Hematology and Oncology Follow Up Visit  [name] [id number] [birth year] 41 y.o. 03/21/2011    HPI: [name] is a 41 year old [place] woman with a newly diagnosed locally advanced triple negative left breast carcinoma, she underwent a left modified radical mastectomy with axillary lymph node dissection under the care Dr. Claud Kelp on 02/21/2011.  #2 Insulin-pump dependent diabetic  Interim History:   [name] is feeling well. As noted, she underwent a left modified radical mastectomy with axillary node dissection under the care of Dr. Claud Kelp on 02/21/2011. She was noted to have 5 separate foci of tumor, with 7 of 14 nodes positive for metastatic disease. Her pathologic staging was a T2, N2a. She had her last drain pulled on 03/17/2011, and is ready to initiate her adjuvant chemotherapy. She has agreed to participate in the NSABP B-49 protocol randomization has not been performed as of yet. She is feeling well, denying any fevers, chills, shortness of breath, or chest pain. She has no underlying nausea, vomiting, heartburn, diarrhea, or constipation. She feels that she is healing well from her surgery. Of note, she has baseline mild grade 0 neuropathy currently, but is on gabapentin 300 mg by mouth 3 times a day. She has also undergone bilateral cataract removal due to diabetic complications. Her vision is fine currently.  A detailed review of systems is otherwise noncontributory as noted below.  Review of Systems: Constitutional:  no weight loss, fever, night sweats and feels well Eyes: No complaints ENT:No complaints Cardiovascular: no chest pain or dyspnea on exertion Respiratory: no cough, shortness of breath, or wheezing Neurological: no TIA or stroke symptoms Dermatological: negative Gastrointestinal: no abdominal pain, change in bowel habits, or black or bloody stools Genito-Urinary: no dysuria, trouble voiding, or hematuria Hematological and  Lymphatic: negative Breast: positive for - known L breast mass secondary to cancer Musculoskeletal: negative Remaining ROS negative.   Medications:   I have reviewed the patient's current medications.  Current Outpatient Prescriptions  Medication Sig Dispense Refill  . gabapentin (NEURONTIN) 300 MG capsule Take 300 mg by mouth 3 (three) times daily.      . Insulin Human (INSULIN PUMP) 100 unit/ml SOLN Inject into the skin continuous.      Marland Kitchen lisinopril (PRINIVIL,ZESTRIL) 10 MG tablet Take 10 mg by mouth daily. For blood pressure       . oxyCODONE-acetaminophen (TYLOX) 5-500 MG per capsule Take 1 capsule by mouth as needed.        Allergies:  Allergies  Allergen Reactions  . Hydrocodone-Acetaminophen Itching and Swelling  . Lantus Itching, Nausea And Vomiting and Swelling  . Nph Iletin I (Insulin Isophane, Mixed Nph) Itching, Nausea And Vomiting and Swelling    Physical Exam: Filed Vitals:   03/21/11 0859  BP: 129/82  Pulse: 75  Temp: 98.4 F (36.9 C)  Weight: 181 lbs. HEENT: Atraumatic and normocephalic, conjunctivae pink sclera anicteric. Oropharynx is benign without oral mucositis or candidiasis. LUNGS: Clear to auscultation bilaterally without wheezing, rhonchi, or decreased breath sounds. No vertebral tenderness with gentle percussion. COR: Regular rate and rhythm without murmurs rubs gallops or clicks. Right anterior chest wall Infuse-a-Port site has healed in well without erythema. ABD: Soft, nontender without organomegaly normal bowel sounds. EXT: Free of pedal edema, no palpable venous cords. No evidence of lymphedema of the left upper extremity. NEURO: Patient is alert and oriented x3, no evidence of any focal deficits.   Lab Results: Lab Results  Component Value Date   WBC 4.6 03/21/2011  HGB 12.2 03/21/2011   HCT 34.3* 03/21/2011   MCV 82.5 03/21/2011   PLT 264 03/21/2011   NEUTROABS 2.3 03/21/2011     Chemistry      Component Value Date/Time   NA 139  03/21/2011 0840   K 4.1 03/21/2011 0840   CL 105 03/21/2011 0840   CO2 28 03/21/2011 0840   BUN 11 03/21/2011 0840   CREATININE 0.77 03/21/2011 0840      Component Value Date/Time   CALCIUM 9.5 03/21/2011 0840   ALKPHOS 121* 03/21/2011 0840   AST 21 03/21/2011 0840   ALT 35 03/21/2011 0840   BILITOT 0.7 03/21/2011 0840       Assessment:  Patient is a 41 year old [place] woman with a newly diagnosed locally advanced triple-negative breast carcinoma of the left breast without evidence to suggest distant metastatic disease per PET/CT scan.  She underwent a left modified radical mastectomy with axillary node dissection and port placement under the care of Dr. Claud Kelp on 02/21/2011. Pathologic staging of T2, N2a. Due for randomization according to NSABP B-49. 2. Patient has a life expectancy of greater than 10 years, 3. ECOG status of 0   Case has been reviewed with Dr. Pierce Crane.  Plan:  The results of randomization according to NSABP B-49. Pending randomization, and anti-emetic regimes  can be prescribed. Of note, we will be very cautious pertaining to her dexamethasone component given the fact that she is an insulin-dependent diabetic diabetic pump although her blood glucoses are under tight control. Nevertheless, she is scheduled to initiate her first cycle of adjuvant chemotherapy on 03/27/2011, I will see her back for followup on 04/26/2011.  This plan was reviewed with the patient, who voices understanding and agreement.  She knows to call with any changes or problems.    Treena Cosman T, PA-C 03/21/2011

## 2011-03-21 NOTE — Telephone Encounter (Signed)
gave patient appointment for 03-29-2011 to see dr. Wynona Canes schere printed out calendar and gave to the patient

## 2011-03-22 ENCOUNTER — Other Ambulatory Visit: Payer: Self-pay | Admitting: *Deleted

## 2011-03-22 ENCOUNTER — Other Ambulatory Visit: Payer: Self-pay | Admitting: Oncology

## 2011-03-22 DIAGNOSIS — C50419 Malignant neoplasm of upper-outer quadrant of unspecified female breast: Secondary | ICD-10-CM

## 2011-03-22 DIAGNOSIS — C50919 Malignant neoplasm of unspecified site of unspecified female breast: Secondary | ICD-10-CM

## 2011-03-22 DIAGNOSIS — C50912 Malignant neoplasm of unspecified site of left female breast: Secondary | ICD-10-CM

## 2011-03-22 MED ORDER — PROCHLORPERAZINE 25 MG RE SUPP: 25.0000 mg | Freq: Two times a day (BID) | RECTAL | Status: DC | PRN

## 2011-03-22 MED ORDER — DEXAMETHASONE 4 MG PO TABS: ORAL_TABLET | ORAL | Status: DC

## 2011-03-22 MED ORDER — LORAZEPAM 0.5 MG PO TABS: 0.5000 mg | ORAL_TABLET | Freq: Four times a day (QID) | ORAL | Status: AC | PRN

## 2011-03-22 MED ORDER — ONDANSETRON HCL 8 MG PO TABS: ORAL_TABLET | ORAL | Status: DC

## 2011-03-22 MED ORDER — PROCHLORPERAZINE MALEATE 10 MG PO TABS: 10.0000 mg | ORAL_TABLET | Freq: Four times a day (QID) | ORAL | Status: DC | PRN

## 2011-03-27 ENCOUNTER — Encounter: Payer: Self-pay | Admitting: *Deleted

## 2011-03-27 ENCOUNTER — Ambulatory Visit (HOSPITAL_BASED_OUTPATIENT_CLINIC_OR_DEPARTMENT_OTHER): Payer: BC Managed Care – PPO

## 2011-03-27 DIAGNOSIS — Z5111 Encounter for antineoplastic chemotherapy: Secondary | ICD-10-CM | POA: Diagnosis not present

## 2011-03-27 DIAGNOSIS — C773 Secondary and unspecified malignant neoplasm of axilla and upper limb lymph nodes: Secondary | ICD-10-CM

## 2011-03-27 DIAGNOSIS — M719 Bursopathy, unspecified: Secondary | ICD-10-CM

## 2011-03-27 DIAGNOSIS — C50919 Malignant neoplasm of unspecified site of unspecified female breast: Secondary | ICD-10-CM

## 2011-03-27 DIAGNOSIS — C50419 Malignant neoplasm of upper-outer quadrant of unspecified female breast: Secondary | ICD-10-CM

## 2011-03-27 DIAGNOSIS — IMO0002 Reserved for concepts with insufficient information to code with codable children: Secondary | ICD-10-CM

## 2011-03-27 DIAGNOSIS — F329 Major depressive disorder, single episode, unspecified: Secondary | ICD-10-CM

## 2011-03-27 DIAGNOSIS — Z171 Estrogen receptor negative status [ER-]: Secondary | ICD-10-CM

## 2011-03-27 DIAGNOSIS — E1065 Type 1 diabetes mellitus with hyperglycemia: Secondary | ICD-10-CM

## 2011-03-27 DIAGNOSIS — M67919 Unspecified disorder of synovium and tendon, unspecified shoulder: Secondary | ICD-10-CM

## 2011-03-27 DIAGNOSIS — R29818 Other symptoms and signs involving the nervous system: Secondary | ICD-10-CM

## 2011-03-27 DIAGNOSIS — C50912 Malignant neoplasm of unspecified site of left female breast: Secondary | ICD-10-CM

## 2011-03-27 DIAGNOSIS — M999 Biomechanical lesion, unspecified: Secondary | ICD-10-CM

## 2011-03-27 DIAGNOSIS — E785 Hyperlipidemia, unspecified: Secondary | ICD-10-CM

## 2011-03-27 DIAGNOSIS — N951 Menopausal and female climacteric states: Secondary | ICD-10-CM

## 2011-03-27 DIAGNOSIS — M222X9 Patellofemoral disorders, unspecified knee: Secondary | ICD-10-CM

## 2011-03-27 MED ORDER — DOCETAXEL CHEMO INJECTION 160 MG/16ML
75.0000 mg/m2 | Freq: Once | INTRAVENOUS | Status: AC
  Administered 2011-03-27: 150 mg via INTRAVENOUS
  Filled 2011-03-27: qty 15

## 2011-03-27 MED ORDER — ONDANSETRON 16 MG/50ML IVPB (CHCC)
16.0000 mg | Freq: Once | INTRAVENOUS | Status: AC
  Administered 2011-03-27: 16 mg via INTRAVENOUS

## 2011-03-27 MED ORDER — HEPARIN SOD (PORK) LOCK FLUSH 100 UNIT/ML IV SOLN
500.0000 [IU] | Freq: Once | INTRAVENOUS | Status: AC | PRN
  Administered 2011-03-27: 500 [IU]
  Filled 2011-03-27: qty 5

## 2011-03-27 MED ORDER — DEXAMETHASONE SODIUM PHOSPHATE 4 MG/ML IJ SOLN
8.0000 mg | Freq: Once | INTRAMUSCULAR | Status: AC
  Administered 2011-03-27: 8 mg via INTRAVENOUS

## 2011-03-27 MED ORDER — SODIUM CHLORIDE 0.9 % IV SOLN
Freq: Once | INTRAVENOUS | Status: AC
  Administered 2011-03-27: 09:00:00 via INTRAVENOUS

## 2011-03-27 MED ORDER — SODIUM CHLORIDE 0.9 % IV SOLN
600.0000 mg/m2 | Freq: Once | INTRAVENOUS | Status: AC
  Administered 2011-03-27: 1160 mg via INTRAVENOUS
  Filled 2011-03-27: qty 58

## 2011-03-27 MED ORDER — SODIUM CHLORIDE 0.9 % IJ SOLN
10.0000 mL | INTRAMUSCULAR | Status: DC | PRN
  Administered 2011-03-27: 10 mL
  Filled 2011-03-27: qty 10

## 2011-03-27 NOTE — Patient Instructions (Signed)
[facility] Discharge Instructions for Patients Receiving Chemotherapy  Today you received the following chemotherapy agents Taxotere/Cytoxan  To help prevent nausea and vomiting after your treatment, we encourage you to take your nausea medication Dr. Donnie Coffin.  If you develop nausea and vomiting that is not controlled by your nausea medication, call the clinic. If it is after clinic hours your family physician or the after hours number for the clinic or go to the Emergency Department.   BELOW ARE SYMPTOMS THAT SHOULD BE REPORTED IMMEDIATELY:  *FEVER GREATER THAN 100.5 F  *CHILLS WITH OR WITHOUT FEVER  NAUSEA AND VOMITING THAT IS NOT CONTROLLED WITH YOUR NAUSEA MEDICATION  *UNUSUAL SHORTNESS OF BREATH  *UNUSUAL BRUISING OR BLEEDING  TENDERNESS IN MOUTH AND THROAT WITH OR WITHOUT PRESENCE OF ULCERS  *URINARY PROBLEMS  *BOWEL PROBLEMS  UNUSUAL RASH Items with * indicate a potential emergency and should be followed up as soon as possible.  One of the nurses will contact you 24 hours after your treatment. Please let the nurse know about any problems that you may have experienced. Feel free to call the clinic you have any questions or concerns. The clinic phone number is [phone].   I have been informed and understand all the instructions given to me. I know to contact the clinic, my physician, or go to the Emergency Department if any problems should occur. I do not have any questions at this time, but understand that I may call the clinic during office hours   should I have any questions or need assistance in obtaining follow up care.    __________________________________________  _____________  __________ Signature of Patient or Authorized Representative            Date                   Time    __________________________________________ Nurse's Signature

## 2011-03-27 NOTE — Progress Notes (Signed)
03/27/11 - NSABP B-49 study notes for Cycle 1, day 1.  The pt was into the clinic this am with her husband for her first cycle of Arm 2 TC.  The pt states she is feeling fine, but she reports that she did not sleep well last night. Rn explained that it may be related to her dexamethasone premedication that Dr. Donnie Coffin called in for her.  The pt reports that she has her nausea medications at home just in case she becomes nauseated.  The pt is aware of her follow-up appt on Wednesday.  Dr. Donnie Coffin wanted her to be followed closely since she is a diabetic patient.  Debbora Presto, [place], confirmed that it was acceptable for the pt to receive her neulasta injection on Wednesday to avoid another office visit on Tuesday.  The pt was given a schedule of her 6 cycles of TC for her records. The research nurse gave the chemo sign to [facility], chemo nurse.  The treatment will be given per our institution's standards.  The Teacher, English as a foreign language, Marilu Favre, also stated that her chemo dosing will be per our institution's medical record.  The recommended dosing provided by NSABP will not be used.  Rn answered all of the pt's questions.  The pt was highly encouraged to call if she experiences any problems or concerns.

## 2011-03-28 ENCOUNTER — Telehealth: Payer: Self-pay | Admitting: *Deleted

## 2011-03-28 NOTE — Telephone Encounter (Signed)
NO PROBLEMS OR QUESTIONS AT THIS TIME. ENCOURAGED PT. TO FORCE FLUIDS. DISCUSSED WAYS TO PREVENT CONSTIPATION. PT. VOICES UNDERSTANDING. SHE WILL CALL THIS OFFICE IF THE NEED ARISES.

## 2011-03-29 ENCOUNTER — Encounter: Payer: Self-pay | Admitting: *Deleted

## 2011-03-29 ENCOUNTER — Ambulatory Visit (HOSPITAL_BASED_OUTPATIENT_CLINIC_OR_DEPARTMENT_OTHER): Payer: BC Managed Care – PPO | Admitting: Physician Assistant

## 2011-03-29 ENCOUNTER — Encounter: Payer: Self-pay | Admitting: Physician Assistant

## 2011-03-29 ENCOUNTER — Telehealth: Payer: Self-pay | Admitting: *Deleted

## 2011-03-29 ENCOUNTER — Ambulatory Visit (HOSPITAL_BASED_OUTPATIENT_CLINIC_OR_DEPARTMENT_OTHER): Payer: BC Managed Care – PPO

## 2011-03-29 DIAGNOSIS — E119 Type 2 diabetes mellitus without complications: Secondary | ICD-10-CM

## 2011-03-29 DIAGNOSIS — Z5112 Encounter for antineoplastic immunotherapy: Secondary | ICD-10-CM | POA: Diagnosis not present

## 2011-03-29 DIAGNOSIS — C50912 Malignant neoplasm of unspecified site of left female breast: Secondary | ICD-10-CM

## 2011-03-29 DIAGNOSIS — C50919 Malignant neoplasm of unspecified site of unspecified female breast: Secondary | ICD-10-CM

## 2011-03-29 MED ORDER — PEGFILGRASTIM INJECTION 6 MG/0.6ML
6.0000 mg | Freq: Once | SUBCUTANEOUS | Status: AC
  Administered 2011-03-29: 6 mg via SUBCUTANEOUS
  Filled 2011-03-29: qty 0.6

## 2011-03-29 NOTE — Progress Notes (Signed)
03/29/11- NSABP B-49 cycle 1, day 3 study notes- The pt and her mother were in the cancer center today for the pt's follow up appointment since beginning cycle 1 on Monday, 03/27/11.  The pt states she has been tolerating treatment well with few side effects.  Her blood sugars have been elevated in the 300's due to the dexamethasone premedication.  She has an insulin pump and adjusts her insulin as necessary.  She denies any nausea, and she has been taking her zofran as prescribed.  She has noticed some constipation which is related to the taxatere and zofran (per the PA's opinion).  The pt was seen and examined today by Debbora Presto, PA.  She reports that she is sleeping better now, but she did not sleep well the night before her chemotherapy ( related to dexamethasone per PA's opinion).  She denies any taste alteration, but her appetite has decreased over the last 3 days.   She also reports some mild heartburn.  She is due today for her neulasta.   The PA wanted to see the pt next week to check her labs.  The pt knows to call for any problems or concerns.  The pt was given a copy of her future appointments.

## 2011-03-29 NOTE — Progress Notes (Signed)
Hematology and Oncology Follow Up Visit  [name] [id number] March 10, 1970 41 y.o. 03/29/2011    HPI: [name] is a 41 year old [place] woman with a newly diagnosed pathologically staged T2, N2 a triple negative left breast carcinoma, N./P. left modified radical mastectomy with axillary lymph node dissection on 02/21/2011. She is participating in NSABP B-49, and has been randomized to 6 cycles of every 3 week Taxotere/Cytoxan with Neulasta support on day 2. She is currently in day 3 cycle 1. 2. Insulin pump dependent diabetic.  Interim History:   [name] is seen today for followup pertaining to the fact that she has just recently initiated her first of 6 planned cycles of every 3 week Taxotere/Cytoxan being given in the adjuvant setting for her history of a triple negative, T2, N2A left breast carcinoma for which he underwent a left modified radical mastectomy with axillary lymph node dissection. She is participating in NSABP B-49 and was randomized to 6 cycles of every 3 week Taxotere/Cytoxan. Of note, patient is also on insulin pump dependent diabetic, she has been monitoring her blood sugars extremely closely, and notes that her blood sugar was 300 this a.m. She has been using Decadron per Taxotere protocol. She skipped day 2, she took 8 mg this morning. Excluding the blood sugar issue, though she is feeling well without nausea, no emesis. She has chronic constipation issues, but denies any abdominal pain or cramping. Slight heartburn symptoms, but no evidence of chest pain. No exacerbation of underlying neuropathy symptoms.  Detailed review of systems is otherwise noncontributory as noted below.  Review of Systems: Constitutional:  no weight loss, fever, night sweats and feels well Eyes: uses glasses ENT: no complaints Cardiovascular: no chest pain or dyspnea on exertion Respiratory: no cough, shortness of breath, or wheezing Neurological: no TIA or stroke  symptoms Dermatological: negative Gastrointestinal: no abdominal pain, change in bowel habits, or black or bloody stools Genito-Urinary: no dysuria, trouble voiding, or hematuria Hematological and Lymphatic: negative Breast: negative Musculoskeletal: negative Remaining ROS negative.   Medications:   I have reviewed the patient's current medications.  Current Outpatient Prescriptions  Medication Sig Dispense Refill  . dexamethasone (DECADRON) 4 MG tablet Take 2 tablets two times a day the day before Taxotere. Then take 2 tabs two times a day starting the day after chemo for 3 days.  30 tablet  1  . gabapentin (NEURONTIN) 300 MG capsule Take 300 mg by mouth 3 (three) times daily.      . Insulin Human (INSULIN PUMP) 100 unit/ml SOLN Inject into the skin continuous.      Marland Kitchen lidocaine-prilocaine (EMLA) cream Apply topically as needed.      Marland Kitchen lisinopril (PRINIVIL,ZESTRIL) 10 MG tablet Take 10 mg by mouth daily. For blood pressure       . LORazepam (ATIVAN) 0.5 MG tablet Take 1 tablet (0.5 mg total) by mouth every 6 (six) hours as needed (Nausea or vomiting).  30 tablet  0  . ondansetron (ZOFRAN) 8 MG tablet Take 1 tablet two times a day starting the day after chemo for 3 days. Then take 1 tab two times a day as needed for nausea or vomiting.  30 tablet  1  . oxyCODONE-acetaminophen (TYLOX) 5-500 MG per capsule Take 1 capsule by mouth as needed.      . prochlorperazine (COMPAZINE) 10 MG tablet Take 1 tablet (10 mg total) by mouth every 6 (six) hours as needed (Nausea or vomiting).  30 tablet  1  . prochlorperazine (  COMPAZINE) 25 MG suppository Place 1 suppository (25 mg total) rectally every 12 (twelve) hours as needed for nausea.  12 suppository  3    Allergies:  Allergies  Allergen Reactions  . Hydrocodone-Acetaminophen Itching and Swelling  . Lantus Itching, Nausea And Vomiting and Swelling  . Nph Iletin I (Insulin Isophane, Mixed Nph) Itching, Nausea And Vomiting and Swelling     Physical Exam: Filed Vitals:   03/29/11 0848  BP: 122/80  Pulse: 79  Temp: 98.6 F (37 C)  Weight: 181 lbs. HEENT:  Sclerae anicteric, conjunctivae pink.  Oropharynx clear.  No mucositis or candidiasis.   Nodes:  No cervical, supraclavicular, or axillary lymphadenopathy palpated.  Breast Exam:  Deferred.   Lungs:  Clear to auscultation bilaterally.  No crackles, rhonchi, or wheezes.   Heart:  Regular rate and rhythm.   Abdomen:  Soft, nontender.  Positive bowel sounds.  No organomegaly or masses palpated.   Musculoskeletal:  No focal spinal tenderness to palpation.  Extremities:  Benign.  No peripheral edema or cyanosis.   Skin:  Benign.   Neuro:  Nonfocal, alert and oriented x 3.   Lab Results: Lab Results  Component Value Date   WBC 4.6 03/21/2011   HGB 12.2 03/21/2011   HCT 34.3* 03/21/2011   MCV 82.5 03/21/2011   PLT 264 03/21/2011   NEUTROABS 2.3 03/21/2011     Chemistry      Component Value Date/Time   NA 139 03/21/2011 0840   K 4.1 03/21/2011 0840   CL 105 03/21/2011 0840   CO2 28 03/21/2011 0840   BUN 11 03/21/2011 0840   CREATININE 0.77 03/21/2011 0840      Component Value Date/Time   CALCIUM 9.5 03/21/2011 0840   ALKPHOS 121* 03/21/2011 0840   AST 21 03/21/2011 0840   ALT 35 03/21/2011 0840   BILITOT 0.7 03/21/2011 0840         Assessment:  [name] is a 41 year old [place] woman with a newly diagnosed pathologically staged T2, N2 a triple negative left breast carcinoma, N./P. left modified radical mastectomy with axillary lymph node dissection on 02/21/2011. She is participating in NSABP B-49, and has been randomized to 6 cycles of every 3 week Taxotere/Cytoxan with Neulasta support on day 2. She is currently in day 3 cycle 1. 2. Insulin pump dependent diabetic. Case reviewed with Dr. Pierce Crane.  ECOG: 0   Plan:  [name] will take 4 mg of Decadron this evening and then discontinue. To continue to monitor blood sugars closely. She is  scheduled for followup on 04/04/2011 which should be essentially a nadir assessment. She will receive her Neulasta injection today. I will see her officially on 03/18 2013 prior to day 1 cycle 2 of her Taxotere/Cytoxan regimen. Of note, I will decrease her dexamethasone to 4 mg by mouth twice a day on 04/16/2011, she will hold the a.m. dose on 04/17/2011, she will then take 4 mg at evening, then 4 mg twice a day on 04/18/2011. She understands and agrees with this plan.  This plan was reviewed with the patient, who voices understanding and agreement.  She knows to call with any changes or problems.    [name] T, PA-C 03/29/2011

## 2011-03-29 NOTE — Patient Instructions (Signed)
Pertaining to the decadron:   TONIGHT (03/29/11) take 1 (4mg ) tablet, then STOP.  For cycle 2 chemo which is scheduled for 04/17/11:                                                 Decadron 4mg  (1 tablet) in am/pm on 04/16/11-                                                 NO DECADRON in the am on 04/17/11, Do take 4mg  in the pm.                                                 Decadron 4mg  in am/pm on 04/18/11, stop on 04/19/11   2. May use OTC "heartburn" meds, ie Pepcid AC or such as directed.  3. If you experience headaches while taking Zofran, try BENADRYL 25mg  tabs, they may relieve this symptom.

## 2011-03-29 NOTE — Telephone Encounter (Signed)
gave patient appointment for 05-2011 thru 05-2011 printed out calendar and gave to the patient 

## 2011-03-29 NOTE — Telephone Encounter (Signed)
made patient appointment for 03-2011 thru 05-2011

## 2011-04-02 ENCOUNTER — Other Ambulatory Visit: Payer: Self-pay | Admitting: Oncology

## 2011-04-03 ENCOUNTER — Other Ambulatory Visit: Payer: Self-pay

## 2011-04-03 ENCOUNTER — Other Ambulatory Visit: Payer: Self-pay | Admitting: Physician Assistant

## 2011-04-03 ENCOUNTER — Telehealth: Payer: Self-pay | Admitting: *Deleted

## 2011-04-03 ENCOUNTER — Ambulatory Visit: Payer: BC Managed Care – PPO | Admitting: Lab

## 2011-04-03 DIAGNOSIS — E1065 Type 1 diabetes mellitus with hyperglycemia: Secondary | ICD-10-CM

## 2011-04-03 LAB — COMPREHENSIVE METABOLIC PANEL
CO2: 28 mEq/L (ref 19–32)
Creatinine, Ser: 0.84 mg/dL (ref 0.50–1.10)
Glucose, Bld: 364 mg/dL — ABNORMAL HIGH (ref 70–99)
Total Bilirubin: 0.4 mg/dL (ref 0.3–1.2)
Total Protein: 7.6 g/dL (ref 6.0–8.3)

## 2011-04-03 NOTE — Progress Notes (Signed)
SPOKE WITH MS. LANDRUM-Vigorito AND TOLD HER THAT NIKKI RN SPOKE WITH DR. Donnie Coffin ABOUT HER BLOOD SUGARS RUNNING HIGH.  HE WOULD LIKE FOR HER TO COME IN TODAY TO CHECK HER BS AND KIDNEY FUNCTION TO MAKE SURE  THERE OK.  PT. WILL COME IN AT 1330.  SHE HAS A CLASS AT 1100.  TOLD HER THAT SHE WILL NEED TO WAIT FOR THE LAB RESULTS.  SHE COULD BE HERE UP TO AN HOUR WAITING FOR THE RESULTS.  PT. VERBALIZES UNDERSTANDING.

## 2011-04-03 NOTE — Telephone Encounter (Signed)
04/03/11- at 9:53am - Research nurse called the patient to check on her back pain this am.  Debbora Presto, Dr. Renelda Loma PA, stated that the patient called Dr. Cyndie Chime yesterday because of her severe back pain.  The pt had been taking claritan as advised by Debbora Presto for prevention of the neulasta related bone pain. The patient said that the claritan was not effective in preventing/controlling her bone pain.  Dr. Cyndie Chime called the patient in decadron for her bone pain.  The pt said that her blood sugars "are over 500 this am".  Research nurse informed both Debbora Presto and Dr. Donnie Coffin of the pt's elevated blood sugars and that she was taking decadron as prescribed.  Dr. Donnie Coffin wanted to see the patient and check a stat CMET.  Dr. Renelda Loma nurse, Sallye Ober, to coordinate today's stat lab appointment with the patient.

## 2011-04-03 NOTE — Progress Notes (Signed)
[name] is seen today after she had contacted Dr. Cyndie Chime yesterday evening stating that she had developed a significant amount of low back pain. Of note, she is currently day 7 cycle one of 6 planned every 2 week doses of Taxotere/Cytoxan with Neulasta given on 03/28/2010. She was prescribed Decadron 8 mg by mouth twice a day for 4 days. Upon checking on her today, it should also be noted the patient is a brittle diabetic on insulin pump. She noted that her blood sugars this morning were 520. She dosed with 14.75 units of regular insulin, she notes that her blood sugar at 1:00 this afternoon was at 400, she received an additional 11.6 units of regular insulin, stat female from today reveals glucose of 364. Otherwise, except for an elevated alkaline phosphatase at 187, was completely within normal limits. She "feels fine". She will be monitoring her blood sugars closely. She'll take Decadron 4 mg this evening and then discontinue. She has Tylox to utilize for bone pain if needed. She is scheduled to see me tomorrow for nadir assessment which we have encouraged her to keep. She understands and agrees with this plan.

## 2011-04-04 ENCOUNTER — Other Ambulatory Visit: Payer: BC Managed Care – PPO | Admitting: Lab

## 2011-04-04 ENCOUNTER — Ambulatory Visit (HOSPITAL_BASED_OUTPATIENT_CLINIC_OR_DEPARTMENT_OTHER): Payer: BC Managed Care – PPO | Admitting: Physician Assistant

## 2011-04-04 VITALS — BP 130/84 | HR 105 | Temp 97.8°F | Ht 65.0 in | Wt 178.4 lb

## 2011-04-04 DIAGNOSIS — E119 Type 2 diabetes mellitus without complications: Secondary | ICD-10-CM | POA: Diagnosis not present

## 2011-04-04 DIAGNOSIS — C50419 Malignant neoplasm of upper-outer quadrant of unspecified female breast: Secondary | ICD-10-CM | POA: Diagnosis not present

## 2011-04-04 LAB — CBC WITH DIFFERENTIAL/PLATELET
Eosinophils Absolute: 0.1 10*3/uL (ref 0.0–0.5)
LYMPH%: 8 % — ABNORMAL LOW (ref 14.0–49.7)
MONO#: 1.1 10*3/uL — ABNORMAL HIGH (ref 0.1–0.9)
NEUT#: 35.4 10*3/uL — ABNORMAL HIGH (ref 1.5–6.5)
Platelets: 285 10*3/uL (ref 145–400)
RBC: 4.2 10*6/uL (ref 3.70–5.45)
WBC: 39.8 10*3/uL — ABNORMAL HIGH (ref 3.9–10.3)

## 2011-04-04 NOTE — Patient Instructions (Signed)
Pertaining to your Decadron dosing:     ---Take Decadron 4mg  (1 tab) in the am, and 1 tab in the pm on 04/16/11,           HOLD the dose on 04/17/11, take 1 tab in the evening on 04/17/11.         Take 1 tab in am and 1 tab in pm on 04/18/11--then STOP.   Pertaining to constipation issues: Try MIRALAX, take as directed.   Record your CBG (blood sugars) for the next few days, contact us with these results on 04/07/11, but call for issues.

## 2011-04-04 NOTE — Progress Notes (Signed)
Hematology and Oncology Follow Up Visit  [name] [id number] May 17, 1970 41 y.o. 04/04/2011    HPI: [name] is a 41 year old [place] woman with a newly diagnosed pathologically staged T2, N2, triple negative left breast carcinoma, s/p left modified radical mastectomy with axillary lymph node dissection on 02/21/2011. She is participating in NSABP B-49, and has been randomized to 6 cycles of every 3 week Taxotere/Cytoxan with Neulasta support on day 2. She is currently in day 7 cycle 1. 2. Insulin pump dependent diabetic.  Interim History:   [name] is seen today for followup after her first of 6 planned cycles of adjuvant q3 week Taxotere/Cytoxan according to NSABP B-49 protocol. She received Neulasta on day 3.  She was seen just yesterday due to the fact that her blood sugars were in the 500 range after she was dosed with dexamethasone for bone pain which is felt to be secondary to Neulasta effect. She took her last milligram dose of Decadron yesterday evening. She notes her blood sugar this morning was 487. She denies any complaints, specifically no fevers, chills, or night sweats. No shortness of breath, chest pain, no nausea emesis, diarrhea or constipation. Her bone pain has been alleviated. She denies any hand-foot changes, no neuropathy symptoms or nailbed sensitivity. She denies any excessive eye tearing. Detailed review of systems is otherwise noncontributory as noted below.  Review of Systems: Constitutional:  no weight loss, fever, night sweats and feels well Eyes: uses glasses ENT: no complaints Cardiovascular: no chest pain or dyspnea on exertion Respiratory: no cough, shortness of breath, or wheezing Neurological: no TIA or stroke symptoms Dermatological: negative Gastrointestinal: no abdominal pain, change in bowel habits, or black or bloody stools Genito-Urinary: no dysuria, trouble voiding, or hematuria Hematological and Lymphatic: negative Breast:  negative Musculoskeletal: negative Remaining ROS negative.   Medications:   I have reviewed the patient's current medications.  Current Outpatient Prescriptions  Medication Sig Dispense Refill  . dexamethasone (DECADRON) 4 MG tablet Take 2 tablets two times a day the day before Taxotere. Then take 2 tabs two times a day starting the day after chemo for 3 days.  30 tablet  1  . gabapentin (NEURONTIN) 300 MG capsule Take 300 mg by mouth 3 (three) times daily.      . Insulin Human (INSULIN PUMP) 100 unit/ml SOLN Inject into the skin continuous.      Marland Kitchen lidocaine-prilocaine (EMLA) cream Apply topically as needed.      Marland Kitchen lisinopril (PRINIVIL,ZESTRIL) 10 MG tablet Take 10 mg by mouth daily. For blood pressure       . LORazepam (ATIVAN) 0.5 MG tablet Take 1 tablet (0.5 mg total) by mouth every 6 (six) hours as needed (Nausea or vomiting).  30 tablet  0  . ondansetron (ZOFRAN) 8 MG tablet Take 1 tablet two times a day starting the day after chemo for 3 days. Then take 1 tab two times a day as needed for nausea or vomiting.  30 tablet  1  . oxyCODONE-acetaminophen (TYLOX) 5-500 MG per capsule Take 1 capsule by mouth as needed.      . prochlorperazine (COMPAZINE) 10 MG tablet Take 1 tablet (10 mg total) by mouth every 6 (six) hours as needed (Nausea or vomiting).  30 tablet  1  . prochlorperazine (COMPAZINE) 25 MG suppository Place 1 suppository (25 mg total) rectally every 12 (twelve) hours as needed for nausea.  12 suppository  3    Allergies:  Allergies  Allergen Reactions  .  Hydrocodone-Acetaminophen Itching and Swelling  . Lantus Itching, Nausea And Vomiting and Swelling  . Nph Iletin I (Insulin Isophane, Mixed Nph) Itching, Nausea And Vomiting and Swelling    Physical Exam: Filed Vitals:   04/04/11 0835  BP: 130/84  Pulse: 105  Temp: 97.8 F (36.6 C)  Weight: 178 lbs. HEENT:  Sclerae anicteric, conjunctivae pink.  Oropharynx clear.  No mucositis or candidiasis.   Nodes:  No  cervical, supraclavicular, or axillary lymphadenopathy palpated.  Breast Exam:  Deferred.   Lungs:  Clear to auscultation bilaterally.  No crackles, rhonchi, or wheezes.   Heart:  Regular rate and rhythm.   Abdomen:  Soft, nontender.  Positive bowel sounds.  No organomegaly or masses palpated.   Musculoskeletal:  No focal spinal tenderness to palpation.  Extremities:  Benign.  No peripheral edema or cyanosis.   Skin:  Benign.   Neuro:  Nonfocal, alert and oriented x 3.   Lab Results: Lab Results  Component Value Date   WBC 39.8* 04/04/2011   HGB 12.6 04/04/2011   HCT 36.9 04/04/2011   MCV 87.8 04/04/2011   PLT 285 04/04/2011   NEUTROABS 35.4* 04/04/2011     Chemistry      Component Value Date/Time   NA 135 04/03/2011 1328   K 4.5 04/03/2011 1328   CL 97 04/03/2011 1328   CO2 28 04/03/2011 1328   BUN 18 04/03/2011 1328   CREATININE 0.84 04/03/2011 1328      Component Value Date/Time   CALCIUM 10.2 04/03/2011 1328   ALKPHOS 183* 04/03/2011 1328   AST 22 04/03/2011 1328   ALT 28 04/03/2011 1328   BILITOT 0.4 04/03/2011 1328         Assessment:  [name] is a 41 year old [place] woman with a newly diagnosed pathologically staged T2, N2 a triple negative left breast carcinoma, s/p left modified radical mastectomy with axillary lymph node dissection on 02/21/2011. She is participating in NSABP B-49, and has been randomized to 6 cycles of every 3 week Taxotere/Cytoxan with Neulasta support on day 2. She is currently in day 7 cycle 1. 2. Insulin pump dependent diabetic.  Case reviewed with Dr. Pierce Crane.  ECOG: 0   Plan:  [name] and continue to monitor her blood sugars and record them accordingly, she will touch base with [place] on 04/07/2011 to let us know the overall trend. She will utilize Tylox if needed for bone pain associated with her Neulasta. She is officially scheduled to be seen on 04/17/2011. Her dexamethasone will be two-week to bit, to be taken 4 mg by mouth twice a day the day  before, 4 mg only in the evening of treatment, and then 4 mg twice a day today after treatment. Again utilizing Neulasta, that this time on day 2. She knows to  contact us prior to 04/07/11 if necessary. This plan was reviewed with the patient, who voices understanding and agreement.  She knows to call with any changes or problems.    Maliek Schellhorn T, PA-C 04/04/2011

## 2011-04-17 ENCOUNTER — Ambulatory Visit (HOSPITAL_BASED_OUTPATIENT_CLINIC_OR_DEPARTMENT_OTHER): Payer: BC Managed Care – PPO | Admitting: Family

## 2011-04-17 ENCOUNTER — Encounter: Payer: Self-pay | Admitting: *Deleted

## 2011-04-17 ENCOUNTER — Other Ambulatory Visit (HOSPITAL_BASED_OUTPATIENT_CLINIC_OR_DEPARTMENT_OTHER): Payer: BC Managed Care – PPO | Admitting: Lab

## 2011-04-17 ENCOUNTER — Ambulatory Visit (HOSPITAL_BASED_OUTPATIENT_CLINIC_OR_DEPARTMENT_OTHER): Payer: BC Managed Care – PPO

## 2011-04-17 ENCOUNTER — Telehealth: Payer: Self-pay | Admitting: Oncology

## 2011-04-17 VITALS — BP 139/87 | HR 93 | Temp 98.0°F | Ht 65.0 in | Wt 180.0 lb

## 2011-04-17 DIAGNOSIS — C50919 Malignant neoplasm of unspecified site of unspecified female breast: Secondary | ICD-10-CM

## 2011-04-17 DIAGNOSIS — C50419 Malignant neoplasm of upper-outer quadrant of unspecified female breast: Secondary | ICD-10-CM | POA: Diagnosis not present

## 2011-04-17 DIAGNOSIS — Z5111 Encounter for antineoplastic chemotherapy: Secondary | ICD-10-CM

## 2011-04-17 DIAGNOSIS — E119 Type 2 diabetes mellitus without complications: Secondary | ICD-10-CM

## 2011-04-17 LAB — CBC WITH DIFFERENTIAL/PLATELET
Basophils Absolute: 0 10*3/uL (ref 0.0–0.1)
Eosinophils Absolute: 0 10*3/uL (ref 0.0–0.5)
HCT: 34.7 % — ABNORMAL LOW (ref 34.8–46.6)
HGB: 11.9 g/dL (ref 11.6–15.9)
LYMPH%: 8.7 % — ABNORMAL LOW (ref 14.0–49.7)
MCHC: 34.3 g/dL (ref 31.5–36.0)
MONO#: 0.4 10*3/uL (ref 0.1–0.9)
NEUT#: 12.4 10*3/uL — ABNORMAL HIGH (ref 1.5–6.5)
NEUT%: 88.1 % — ABNORMAL HIGH (ref 38.4–76.8)
Platelets: 394 10*3/uL (ref 145–400)
WBC: 14.1 10*3/uL — ABNORMAL HIGH (ref 3.9–10.3)

## 2011-04-17 LAB — COMPREHENSIVE METABOLIC PANEL
BUN: 10 mg/dL (ref 6–23)
CO2: 24 mEq/L (ref 19–32)
Calcium: 9.9 mg/dL (ref 8.4–10.5)
Creatinine, Ser: 0.67 mg/dL (ref 0.50–1.10)
Glucose, Bld: 481 mg/dL — ABNORMAL HIGH (ref 70–99)
Total Bilirubin: 0.5 mg/dL (ref 0.3–1.2)

## 2011-04-17 MED ORDER — DEXAMETHASONE SODIUM PHOSPHATE 4 MG/ML IJ SOLN
8.0000 mg | Freq: Once | INTRAMUSCULAR | Status: AC
  Administered 2011-04-17: 8 mg via INTRAVENOUS

## 2011-04-17 MED ORDER — ONDANSETRON 16 MG/50ML IVPB (CHCC)
16.0000 mg | Freq: Once | INTRAVENOUS | Status: AC
  Administered 2011-04-17: 16 mg via INTRAVENOUS

## 2011-04-17 MED ORDER — DOCETAXEL CHEMO INJECTION 160 MG/16ML
75.0000 mg/m2 | Freq: Once | INTRAVENOUS | Status: AC
  Administered 2011-04-17: 150 mg via INTRAVENOUS
  Filled 2011-04-17: qty 15

## 2011-04-17 MED ORDER — SODIUM CHLORIDE 0.9 % IV SOLN
Freq: Once | INTRAVENOUS | Status: AC
  Administered 2011-04-17: 10:00:00 via INTRAVENOUS

## 2011-04-17 MED ORDER — SODIUM CHLORIDE 0.9 % IV SOLN
600.0000 mg/m2 | Freq: Once | INTRAVENOUS | Status: AC
  Administered 2011-04-17: 1160 mg via INTRAVENOUS
  Filled 2011-04-17: qty 58

## 2011-04-17 NOTE — Telephone Encounter (Signed)
lmonvm adviisng the pt of her cancelled injection appt for 04/18/2011

## 2011-04-17 NOTE — Progress Notes (Signed)
04/17/11 NSABP B-49 Cycle 2, day 1 study notes- The pt and her co-pastor were into the clinic this am for her cycle 2, day 1 treatment.  The pt states she feels "great".  She specifically denies any adverse events.  She is performing all of her usual pre-disease activities.  ECOG=0.  The pt started her premedication with dexamethasone as prescribed yesterday.  She noted that her blood sugar was up this am.  The pt has an insulin pump, and she manages her blood sugars.  The pt's blood glucose was 481 in the clinic, but it was a non-fasting value.  The investigator does not feel that this elevated glucose is a "clinically significant" event.  The pt's weight is stable and her chemo dosages will remain the same for cycle 2.   The pt's cbc/diff is within normal limits for treatment today.  The pt's stat chemistries revealed the following grade 1 toxicities:  Hyponatremia, increased alkaline phosphatase, AST and ALT.  No dose modifications are required for these grade 1 events.  The pt stated that she wanted to not receive the Neulasta for cycle 2.  She said that the bone pain from the Neulasta was severe.  The patient was seen and examined today by Colman Cater, NP.  The NP instructed the pt that she might develop neutropenia and could be hospitalized if she develops an infection.  The pt said that she will avoid crowds, but she wanted to try not taking the shot for cycle 2.  Research nurse discussed the pt's treatment with Morrie Sheldon, chemo nurse.  The pt knows to call if she has any problems or concerns.

## 2011-04-17 NOTE — Patient Instructions (Signed)
Cancel injection for 3/19. We will try this cycle without it, may need support later if white count drops.

## 2011-04-18 ENCOUNTER — Other Ambulatory Visit (INDEPENDENT_AMBULATORY_CARE_PROVIDER_SITE_OTHER): Payer: Self-pay

## 2011-04-18 ENCOUNTER — Encounter: Payer: Self-pay | Admitting: Family

## 2011-04-18 ENCOUNTER — Ambulatory Visit (INDEPENDENT_AMBULATORY_CARE_PROVIDER_SITE_OTHER): Payer: BC Managed Care – PPO | Admitting: General Surgery

## 2011-04-18 ENCOUNTER — Ambulatory Visit: Payer: BC Managed Care – PPO

## 2011-04-18 ENCOUNTER — Encounter (INDEPENDENT_AMBULATORY_CARE_PROVIDER_SITE_OTHER): Payer: Self-pay | Admitting: General Surgery

## 2011-04-18 VITALS — BP 136/88 | HR 72 | Temp 97.8°F | Resp 18 | Ht 65.0 in | Wt 181.4 lb

## 2011-04-18 DIAGNOSIS — C50919 Malignant neoplasm of unspecified site of unspecified female breast: Secondary | ICD-10-CM

## 2011-04-18 DIAGNOSIS — C50419 Malignant neoplasm of upper-outer quadrant of unspecified female breast: Secondary | ICD-10-CM

## 2011-04-18 NOTE — Patient Instructions (Signed)
Your  left mastectomy wound is well healed. There is no sign of cancer. The port a catheter site looks good.  I agree with the plan for chemotherapy to be followed by chest wall radiation.  We discussed reconstructive options today. You will be referred to a plastic surgeon for an initial consultation.  You will be given an appointment to return to see Dr. Derrell Lolling in July 2013.

## 2011-04-18 NOTE — Progress Notes (Signed)
Subjective:     Patient ID: [name], female   DOB: 1970/02/20, 41 y.o.   MRN: [id number]  HPI This patient returns for followup regarding her left breast cancer.  In January of this year she underwent left modified radical mastectomy and Port-A-Cath insertion. She had 5 separate cancers, 7/14 lymph nodes positive, triple negative tumor. Pathologic stage T2, N2a.  She is receiving adjuvant chemotherapy under the guidance of Dr. Pierce Crane and is tolerating that fairly well. She knows that she will get left chest wall radiation following chemotherapy. There been no problems with the Port-A-Cath.  We talked about reconstructive issues today. She is interested in a plastic surgery consultation. She'll be referred for plastic surgery consultation.  Review of Systems     Objective:   Physical Exam Patient looks well. She is in no distress.  Neck reveals no adenopathy or mass  Chest lungs are clear. Port-A-Cath site right infraclavicular area is well healed no complications.  Breasts left mastectomy wound is soft and well healed. Minimal redundancy of skin except a little bit laterally. No axillary adenopathy.    Assessment:     Multifocal invasive cancer left breast, pathologic stage TII, N2 A., triple negative tumor, 7/14 lymph nodes positive.  Uneventful healing and recovery following left modified radical mastectomy and Port-A-Cath insertion.    Plan:     The patient will continue with her adjuvant chemotherapy. She states that she should complete her chemotherapy in June and then she will need  left chest wall radiation.  She'll be referred for plastic surgery consultation. We discussed multiple approaches to give her some idea about what will be discussed. She is aware that she will have to recover from her radiation therapy report and could be done surgically.  Return to see me July 2013.   Angelia Mould. Derrell Lolling, M.D., [facility] General  and Minimally invasive Surgery Breast and Colorectal Surgery Office:   [phone] Pager:   [phone]

## 2011-04-18 NOTE — Progress Notes (Signed)
Hematology and Oncology Follow Up Visit  [name] [id number] 12/08/1970 40 y.o. 04/18/2011    HPI: 1. Left breast cancer, T2, N2, triple negative.  S/P left modified radical mastectomy with axillary lymph node dissection 02/21/2011.Is participating in NSABP B-49, randomized to 6 cycles of every 3 week Taxotere/Cytoxan with Neulasta support on day 2, here to begin cycle 2.  2. Insulin pump dependent diabetic.  Interim History:   Here for cycle 2 of 6 planned cycles of adjuvant q3 week Taxotere/Cytoxan according to NSABP B-49 protocol. Tolerated first cycle well, with no significant complications, other than elevated blood sugar.Blood sugar was 437 this am, after 2 doses of Decadron yesterday, one in am and one in pm. Did not take Decadron this am per Chris's instructions. Received Neulasta on day 3.  Denies any complaints, specifically no fevers, chills, or night sweats. No shortness of breath, chest pain, no nausea emesis, diarrhea or constipation. Had significant bone pain after Neulasta injection, describes it as "worse than labor pains". The bone pain has been alleviated. Denies hand-foot changes, no neuropathy symptoms or nailbed sensitivity. No excessive eye tearing. Detailed review of systems is otherwise noncontributory as noted below.  Medications:   I have reviewed the patient's current medications.  Current Outpatient Prescriptions  Medication Sig Dispense Refill  . dexamethasone (DECADRON) 4 MG tablet Take 2 tablets two times a day the day before Taxotere. Then take 2 tabs two times a day starting the day after chemo for 3 days.  30 tablet  1  . gabapentin (NEURONTIN) 300 MG capsule Take 300 mg by mouth 3 (three) times daily.      . Insulin Human (INSULIN PUMP) 100 unit/ml SOLN Inject into the skin continuous.      Marland Kitchen lidocaine-prilocaine (EMLA) cream Apply topically as needed.      Marland Kitchen lisinopril (PRINIVIL,ZESTRIL) 10 MG tablet Take 10 mg by mouth daily. For blood  pressure       . LORazepam (ATIVAN) 0.5 MG tablet Take 1 tablet (0.5 mg total) by mouth every 6 (six) hours as needed (Nausea or vomiting).  30 tablet  0  . ondansetron (ZOFRAN) 8 MG tablet Take 1 tablet two times a day starting the day after chemo for 3 days. Then take 1 tab two times a day as needed for nausea or vomiting.  30 tablet  1  . oxyCODONE-acetaminophen (TYLOX) 5-500 MG per capsule Take 1 capsule by mouth as needed.      . prochlorperazine (COMPAZINE) 10 MG tablet Take 1 tablet (10 mg total) by mouth every 6 (six) hours as needed (Nausea or vomiting).  30 tablet  1  . prochlorperazine (COMPAZINE) 25 MG suppository Place 1 suppository (25 mg total) rectally every 12 (twelve) hours as needed for nausea.  12 suppository  3   No current facility-administered medications for this visit.   Facility-Administered Medications Ordered in Other Visits  Medication Dose Route Frequency Provider Last Rate Last Dose  . 0.9 %  sodium chloride infusion   Intravenous Once Pierce Crane, MD        Allergies:  Allergies  Allergen Reactions  . Hydrocodone-Acetaminophen Itching and Swelling  . Lantus Itching, Nausea And Vomiting and Swelling  . Nph Iletin I (Insulin Isophane, Mixed Nph) Itching, Nausea And Vomiting and Swelling    Physical Exam: Filed Vitals:   04/17/11 0853  BP: 139/87  Pulse: 93  Temp: 98 F (36.7 C)  Weight: 178 lbs. HEENT:  Sclerae anicteric, conjunctivae pink.  Oropharynx  clear.  No mucositis or candidiasis.   Nodes:  No cervical, supraclavicular, or axillary lymphadenopathy palpated.  Breast Exam:  Deferred.   Lungs:  Clear to auscultation bilaterally.  No crackles, rhonchi, or wheezes.   Heart:  Regular rate and rhythm.   Abdomen:  Soft, nontender.  Positive bowel sounds.  No organomegaly or masses palpated.  Insulin pump, right lower quadrant.  Musculoskeletal:  No focal spinal tenderness to palpation.  Extremities:  Benign.  No peripheral edema or cyanosis.   Skin:   Benign.   Neuro:  Nonfocal, alert and oriented x 3.   Lab Results: Lab Results  Component Value Date   WBC 14.1* 04/17/2011   HGB 11.9 04/17/2011   HCT 34.7* 04/17/2011   MCV 86.7 04/17/2011   PLT 394 04/17/2011   NEUTROABS 12.4* 04/17/2011     Chemistry      Component Value Date/Time   NA 133* 04/17/2011 0845   K 3.8 04/17/2011 0845   CL 98 04/17/2011 0845   CO2 24 04/17/2011 0845   BUN 10 04/17/2011 0845   CREATININE 0.67 04/17/2011 0845      Component Value Date/Time   CALCIUM 9.9 04/17/2011 0845   ALKPHOS 192* 04/17/2011 0845   AST 42* 04/17/2011 0845   ALT 74* 04/17/2011 0845   BILITOT 0.5 04/17/2011 0845      Assessment:  1. Left breast cancer, on treatment as outlined above. On treatment with good tolerance.  2. Insulin pump dependent diabetic, blood sugars poorly controlled with the use of Decadron. 3. Labs OK to treat.   Plan:  1. Cancel Neulasta injection scheduled for 3/19 per her request. I caution her about the possibility of chemo associated neutropenia.  2. Continue to monitor blood sugars and record them accordingly.  3. Take Decadron as outlined by Thayer Ohm on previous visit: 4 mg by mouth twice a day the day before chemo, 4 mg only in the evening of treatment, and then 4 mg twice a day today after treatment.  4. Return 3/25 to see Thayer Ohm.  5. Treatment today.  This plan was reviewed with the patient, who voices understanding and agreement.  She knows to call with any changes or problems.    Arnitra Sokoloski,FNP  04/18/2011

## 2011-04-24 ENCOUNTER — Other Ambulatory Visit: Payer: BC Managed Care – PPO | Admitting: Lab

## 2011-04-24 ENCOUNTER — Ambulatory Visit (HOSPITAL_BASED_OUTPATIENT_CLINIC_OR_DEPARTMENT_OTHER): Payer: BC Managed Care – PPO | Admitting: Physician Assistant

## 2011-04-24 ENCOUNTER — Encounter: Payer: Self-pay | Admitting: Physician Assistant

## 2011-04-24 VITALS — BP 137/87 | HR 120 | Temp 98.3°F | Ht 65.0 in | Wt 179.5 lb

## 2011-04-24 DIAGNOSIS — T50904A Poisoning by unspecified drugs, medicaments and biological substances, undetermined, initial encounter: Secondary | ICD-10-CM

## 2011-04-24 DIAGNOSIS — D702 Other drug-induced agranulocytosis: Secondary | ICD-10-CM | POA: Diagnosis not present

## 2011-04-24 DIAGNOSIS — E1065 Type 1 diabetes mellitus with hyperglycemia: Secondary | ICD-10-CM

## 2011-04-24 DIAGNOSIS — C50912 Malignant neoplasm of unspecified site of left female breast: Secondary | ICD-10-CM

## 2011-04-24 DIAGNOSIS — C50919 Malignant neoplasm of unspecified site of unspecified female breast: Secondary | ICD-10-CM

## 2011-04-24 LAB — CBC WITH DIFFERENTIAL/PLATELET
BASO%: 3.4 % — ABNORMAL HIGH (ref 0.0–2.0)
Eosinophils Absolute: 0 10*3/uL (ref 0.0–0.5)
LYMPH%: 88.2 % — ABNORMAL HIGH (ref 14.0–49.7)
MCHC: 35 g/dL (ref 31.5–36.0)
MONO#: 0 10*3/uL — ABNORMAL LOW (ref 0.1–0.9)
NEUT#: 0.1 10*3/uL — CL (ref 1.5–6.5)
Platelets: 263 10*3/uL (ref 145–400)
RBC: 3.76 10*6/uL (ref 3.70–5.45)
RDW: 13.4 % (ref 11.2–14.5)
WBC: 1.2 10*3/uL — ABNORMAL LOW (ref 3.9–10.3)
lymph#: 1.1 10*3/uL (ref 0.9–3.3)
nRBC: 0 % (ref 0–0)

## 2011-04-24 LAB — COMPREHENSIVE METABOLIC PANEL
ALT: 105 U/L — ABNORMAL HIGH (ref 0–35)
AST: 73 U/L — ABNORMAL HIGH (ref 0–37)
CO2: 25 mEq/L (ref 19–32)
Calcium: 9.2 mg/dL (ref 8.4–10.5)
Chloride: 104 mEq/L (ref 96–112)
Creatinine, Ser: 0.67 mg/dL (ref 0.50–1.10)
Potassium: 3.4 mEq/L — ABNORMAL LOW (ref 3.5–5.3)
Sodium: 138 mEq/L (ref 135–145)
Total Protein: 6.2 g/dL (ref 6.0–8.3)

## 2011-04-24 MED ORDER — FLUCONAZOLE 100 MG PO TABS: 100.0000 mg | ORAL_TABLET | Freq: Every day | ORAL | Status: AC

## 2011-04-24 MED ORDER — CIPROFLOXACIN HCL 500 MG PO TABS: 500.0000 mg | ORAL_TABLET | Freq: Two times a day (BID) | ORAL | Status: AC

## 2011-04-24 NOTE — Patient Instructions (Addendum)
1.) Lubricating eye drops for excessive tearing-use as directed.  2.) Vinegar soaks for nail sensitivity:  1 part Vinegar, 2 parts room temp water  (soak for , twice a day)  3.) You may use Lorazepam 0.5mg  tabs for sleep (in fact you, can break it half--may take up 2 tablets every 6-8h if needed)  4.) For mouth sensitivity: 1 tsp table salt added to 1 qt. Water, swish and spit as needed.

## 2011-04-24 NOTE — Progress Notes (Signed)
Hematology and Oncology Follow Up Visit  [name] [id number] [birth year] 41 y.o. 04/24/2011    HPI: [name] is a 41 year old [place] woman with a newly diagnosed pathologically staged T2, N2, triple negative left breast carcinoma, s/p left modified radical mastectomy with axillary lymph node dissection on 02/21/2011. She is participating in NSABP B-49, and has been randomized to 6 cycles of every 3 week Taxotere/Cytoxan with Neulasta support on day 2, though held with cycle 2 due to pain associated with Neulasta with cycle . She is currently in day 7 cycle 2. 2. Insulin pump dependent diabetic.  Interim History:   [name] is seen today for followup after cycle 2 of 6 planned cycles of adjuvant q3 week Taxotere/Cytoxan according to NSABP B-49 protocol. Neulasta was held with this cycle due to severe bone pain with cycle 1. She is feeling fair. She notes her blood sugars were 232 this morning. She denies any bony pain whatsoever. She is fatigued. She denies fevers, chills, or night sweats. No diarrhea or constipation issues. She has not experienced any nausea. She notes her sense of taste is definitely off, she has some mouth sensitivity. She has noted some eye tearing, and now that sensitivity. Otherwise, a detailed review of systems is otherwise noncontributory as noted below.  Review of Systems: Constitutional:  no weight loss, fever, night sweats and feels well Eyes: uses glasses ENT: no complaints Cardiovascular: no chest pain or dyspnea on exertion Respiratory: no cough, shortness of breath, or wheezing Neurological: no TIA or stroke symptoms Dermatological: negative Gastrointestinal: no abdominal pain, change in bowel habits, or black or bloody stools Genito-Urinary: no dysuria, trouble voiding, or hematuria Hematological and Lymphatic: negative Breast: negative Musculoskeletal: negative Remaining ROS negative.   Medications:   I have reviewed the  patient's current medications.  Current Outpatient Prescriptions  Medication Sig Dispense Refill  . dexamethasone (DECADRON) 4 MG tablet Take 2 tablets two times a day the day before Taxotere. Then take 2 tabs two times a day starting the day after chemo for 3 days.  30 tablet  1  . gabapentin (NEURONTIN) 300 MG capsule Take 300 mg by mouth 3 (three) times daily.      . Insulin Human (INSULIN PUMP) 100 unit/ml SOLN Inject into the skin continuous.      Marland Kitchen lidocaine-prilocaine (EMLA) cream Apply topically as needed.      Marland Kitchen lisinopril (PRINIVIL,ZESTRIL) 10 MG tablet Take 10 mg by mouth daily. For blood pressure       . LORazepam (ATIVAN) 0.5 MG tablet Take 1 tablet (0.5 mg total) by mouth every 6 (six) hours as needed (Nausea or vomiting).  30 tablet  0  . ondansetron (ZOFRAN) 8 MG tablet Take 1 tablet two times a day starting the day after chemo for 3 days. Then take 1 tab two times a day as needed for nausea or vomiting.  30 tablet  1  . oxyCODONE-acetaminophen (TYLOX) 5-500 MG per capsule Take 1 capsule by mouth as needed.      . prochlorperazine (COMPAZINE) 10 MG tablet Take 1 tablet (10 mg total) by mouth every 6 (six) hours as needed (Nausea or vomiting).  30 tablet  1  . prochlorperazine (COMPAZINE) 25 MG suppository Place 1 suppository (25 mg total) rectally every 12 (twelve) hours as needed for nausea.  12 suppository  3  . ciprofloxacin (CIPRO) 500 MG tablet Take 1 tablet (500 mg total) by mouth 2 (two) times daily.  60 tablet  0  .  fluconazole (DIFLUCAN) 100 MG tablet Take 1 tablet (100 mg total) by mouth daily.  30 tablet  0    Allergies:  Allergies  Allergen Reactions  . Hydrocodone-Acetaminophen Itching and Swelling  . Lantus Itching, Nausea And Vomiting and Swelling  . Nph Iletin I (Insulin Isophane, Mixed Nph) Itching, Nausea And Vomiting and Swelling    Physical Exam: Filed Vitals:   04/24/11 0859  BP: 137/87  Pulse: 120  Temp: 98.3 F (36.8 C)  Weight: 179 lbs. HEENT:   Sclerae anicteric, conjunctivae pink.  Oropharynx clear.  No mucositis or candidiasis.   Nodes:  No cervical, supraclavicular, or axillary lymphadenopathy palpated.  Breast Exam:  Deferred.   Lungs:  Clear to auscultation bilaterally.  No crackles, rhonchi, or wheezes.   Heart:  Regular rate and rhythm.   Abdomen:  Soft, nontender.  Positive bowel sounds.  No organomegaly or masses palpated.   Musculoskeletal:  No focal spinal tenderness to palpation.  Extremities:  Benign.  No peripheral edema or cyanosis.   Skin:  Benign.   Neuro:  Nonfocal, alert and oriented x 3.   Lab Results: Lab Results  Component Value Date   WBC 1.2* 04/24/2011   HGB 10.9* 04/24/2011   HCT 31.1* 04/24/2011   MCV 82.7 04/24/2011   PLT 263 04/24/2011   NEUTROABS 0.1* 04/24/2011     Chemistry      Component Value Date/Time   NA 133* 04/17/2011 0845   K 3.8 04/17/2011 0845   CL 98 04/17/2011 0845   CO2 24 04/17/2011 0845   BUN 10 04/17/2011 0845   CREATININE 0.67 04/17/2011 0845      Component Value Date/Time   CALCIUM 9.9 04/17/2011 0845   ALKPHOS 192* 04/17/2011 0845   AST 42* 04/17/2011 0845   ALT 74* 04/17/2011 0845   BILITOT 0.5 04/17/2011 0845         Assessment:  [name] is a 41 year old [place] woman with a newly diagnosed pathologically staged T2, N2 a triple negative left breast carcinoma, s/p left modified radical mastectomy with axillary lymph node dissection on 02/21/2011. She is participating in NSABP B-49, and has been randomized to 6 cycles of every 3 week Taxotere/Cytoxan with Neulasta support on day 2, held with this cycle. Currently in day 7 cycle 2. 2. Insulin pump dependent diabetic. 3. Severe afebrile neutropenia due to Neulasta being held on day 2. 4. Excessive eye tearing, secondary to Taxotere-based therapy. 5. Nailbed sensitivity, secondary to Taxotere-based therapy.  Case reviewed with Dr. Pierce Crane.  ECOG: 0   Plan:  [name] will initiate Cipro 500 mg by mouth  twice a day for her neutropenia. She will follow neutropenic precautions. She knows that we will need to restart Neulasta with cycle 3. This said, she knows to contact us prior to her scheduled followup on 05/04/2011 if the need should arise. She will also obtain an artificial tear type product due to her excessive eye tearing, she was instructed to use vinegar soaks for her nailbed sensitivity.  This plan was reviewed with the patient, who voices understanding and agreement.  She knows to call with any changes or problems.    [name] T, PA-C 04/24/2011

## 2011-04-27 ENCOUNTER — Other Ambulatory Visit: Payer: Self-pay | Admitting: *Deleted

## 2011-04-27 DIAGNOSIS — I10 Essential (primary) hypertension: Secondary | ICD-10-CM | POA: Diagnosis not present

## 2011-04-27 DIAGNOSIS — E1065 Type 1 diabetes mellitus with hyperglycemia: Secondary | ICD-10-CM | POA: Diagnosis not present

## 2011-04-27 DIAGNOSIS — E785 Hyperlipidemia, unspecified: Secondary | ICD-10-CM

## 2011-04-27 DIAGNOSIS — R748 Abnormal levels of other serum enzymes: Secondary | ICD-10-CM | POA: Diagnosis not present

## 2011-04-27 DIAGNOSIS — E7800 Pure hypercholesterolemia, unspecified: Secondary | ICD-10-CM | POA: Diagnosis not present

## 2011-04-27 DIAGNOSIS — C50912 Malignant neoplasm of unspecified site of left female breast: Secondary | ICD-10-CM

## 2011-04-28 ENCOUNTER — Other Ambulatory Visit (HOSPITAL_BASED_OUTPATIENT_CLINIC_OR_DEPARTMENT_OTHER): Payer: BC Managed Care – PPO | Admitting: Lab

## 2011-04-28 DIAGNOSIS — E1065 Type 1 diabetes mellitus with hyperglycemia: Secondary | ICD-10-CM

## 2011-04-28 DIAGNOSIS — E785 Hyperlipidemia, unspecified: Secondary | ICD-10-CM

## 2011-04-28 LAB — CBC WITH DIFFERENTIAL/PLATELET
EOS%: 1.8 % (ref 0.0–7.0)
Eosinophils Absolute: 0 10*3/uL (ref 0.0–0.5)
LYMPH%: 61.5 % — ABNORMAL HIGH (ref 14.0–49.7)
MCH: 29.8 pg (ref 25.1–34.0)
MCV: 87.9 fL (ref 79.5–101.0)
MONO%: 28.2 % — ABNORMAL HIGH (ref 0.0–14.0)
NEUT#: 0.2 10*3/uL — CL (ref 1.5–6.5)
Platelets: 252 10*3/uL (ref 145–400)
RBC: 3.75 10*6/uL (ref 3.70–5.45)
nRBC: 1 % — ABNORMAL HIGH (ref 0–0)

## 2011-05-01 ENCOUNTER — Telehealth (INDEPENDENT_AMBULATORY_CARE_PROVIDER_SITE_OTHER): Payer: Self-pay | Admitting: General Surgery

## 2011-05-01 NOTE — Telephone Encounter (Signed)
Pt needs prescription for sleeve/ glove for arm swelling following surgery.  Her insurance company does not network with [facility], so she needs to pick up written script from [facility].

## 2011-05-04 ENCOUNTER — Other Ambulatory Visit (HOSPITAL_BASED_OUTPATIENT_CLINIC_OR_DEPARTMENT_OTHER): Payer: BC Managed Care – PPO | Admitting: Lab

## 2011-05-04 ENCOUNTER — Ambulatory Visit (HOSPITAL_BASED_OUTPATIENT_CLINIC_OR_DEPARTMENT_OTHER): Payer: BC Managed Care – PPO | Admitting: Physician Assistant

## 2011-05-04 ENCOUNTER — Telehealth (INDEPENDENT_AMBULATORY_CARE_PROVIDER_SITE_OTHER): Payer: Self-pay

## 2011-05-04 ENCOUNTER — Telehealth: Payer: Self-pay | Admitting: Oncology

## 2011-05-04 ENCOUNTER — Other Ambulatory Visit: Payer: Self-pay | Admitting: *Deleted

## 2011-05-04 VITALS — BP 154/86 | HR 106 | Temp 98.4°F | Ht 65.0 in | Wt 186.2 lb

## 2011-05-04 DIAGNOSIS — D702 Other drug-induced agranulocytosis: Secondary | ICD-10-CM

## 2011-05-04 DIAGNOSIS — C50919 Malignant neoplasm of unspecified site of unspecified female breast: Secondary | ICD-10-CM

## 2011-05-04 DIAGNOSIS — C50912 Malignant neoplasm of unspecified site of left female breast: Secondary | ICD-10-CM

## 2011-05-04 DIAGNOSIS — C50419 Malignant neoplasm of upper-outer quadrant of unspecified female breast: Secondary | ICD-10-CM

## 2011-05-04 DIAGNOSIS — E119 Type 2 diabetes mellitus without complications: Secondary | ICD-10-CM

## 2011-05-04 DIAGNOSIS — I890 Lymphedema, not elsewhere classified: Secondary | ICD-10-CM

## 2011-05-04 LAB — CBC WITH DIFFERENTIAL/PLATELET
BASO%: 0.4 % (ref 0.0–2.0)
EOS%: 0.1 % (ref 0.0–7.0)
HCT: 34.9 % (ref 34.8–46.6)
LYMPH%: 25.4 % (ref 14.0–49.7)
MCH: 29.4 pg (ref 25.1–34.0)
MCHC: 34.1 g/dL (ref 31.5–36.0)
NEUT%: 67.7 % (ref 38.4–76.8)
Platelets: 285 10*3/uL (ref 145–400)
lymph#: 1.4 10*3/uL (ref 0.9–3.3)

## 2011-05-04 LAB — COMPREHENSIVE METABOLIC PANEL
AST: 31 U/L (ref 0–37)
Alkaline Phosphatase: 169 U/L — ABNORMAL HIGH (ref 39–117)
BUN: 8 mg/dL (ref 6–23)
Creatinine, Ser: 0.68 mg/dL (ref 0.50–1.10)

## 2011-05-04 NOTE — Telephone Encounter (Signed)
RX for lymphedema sleeve signed and at front desk for pick up.

## 2011-05-04 NOTE — Progress Notes (Signed)
Hematology and Oncology Follow Up Visit  STASHA NARAINE [id number] Feb 22, 1970 41 y.o. 05/04/2011    HPI: Kendra is a 41 year old [place] woman with a newly diagnosed pathologically staged T2, N2, triple negative left breast carcinoma, s/p left modified radical mastectomy with axillary lymph node dissection on 02/21/2011. She is participating in NSABP B-49, and has been randomized to 6 cycles of every 3 week Taxotere/Cytoxan with Neulasta support on day 2, though held with cycle 2 due to pain associated with Neulasta with cycle . She is due for day 1 cycle 3 of Taxotere/Cytoxan on 05/08/2011 2. Insulin pump dependent diabetic.  Interim History:   Aryanne is seen today for followup. Overall she is feeling well. A repeat CBC was obtained today which showed evidence of normalization in regard to her WBC. She overall is feeling well, but notes that her left upper extremity has become a bit swollen, first noted over the weekend without any history of trauma or significant heavy lifting. The area is improving, but she notices it is still a bit swollen. She denies any frank pain just a bit of tightness. Otherwise, no fevers, chills, night sweats. No nausea, emesis, diarrhea or constipation issues. Her excessive eye tearing has improved with the utilization of lubricating eyedrops. She denies any worsening of her nailbed sensitivity, she has been utilizing vinegar soaks. A detailed review of systems is otherwise noncontributory as noted below.  Review of Systems: Constitutional:  no weight loss, fever, night sweats and feels well Eyes: uses glasses ENT: no complaints Cardiovascular: no chest pain or dyspnea on exertion Respiratory: no cough, shortness of breath, or wheezing Neurological: no TIA or stroke symptoms Dermatological: negative Gastrointestinal: no abdominal pain, change in bowel habits, or black or bloody stools Genito-Urinary: no dysuria, trouble voiding, or  hematuria Hematological and Lymphatic: negative Breast: negative Musculoskeletal: Evidence of left upper extremity edema. Remaining ROS negative.   Medications:   I have reviewed the patient's current medications.  Current Outpatient Prescriptions  Medication Sig Dispense Refill  . ciprofloxacin (CIPRO) 500 MG tablet Take 1 tablet (500 mg total) by mouth 2 (two) times daily.  60 tablet  0  . dexamethasone (DECADRON) 4 MG tablet Take 2 tablets two times a day the day before Taxotere. Then take 2 tabs two times a day starting the day after chemo for 3 days.  30 tablet  1  . fluconazole (DIFLUCAN) 100 MG tablet Take 1 tablet (100 mg total) by mouth daily.  30 tablet  0  . gabapentin (NEURONTIN) 300 MG capsule Take 300 mg by mouth 3 (three) times daily.      . Insulin Human (INSULIN PUMP) 100 unit/ml SOLN Inject into the skin continuous.      Marland Kitchen lidocaine-prilocaine (EMLA) cream Apply topically as needed.      Marland Kitchen lisinopril (PRINIVIL,ZESTRIL) 10 MG tablet Take 10 mg by mouth daily. For blood pressure       . LORazepam (ATIVAN) 0.5 MG tablet Take 1 tablet (0.5 mg total) by mouth every 6 (six) hours as needed (Nausea or vomiting).  30 tablet  0  . ondansetron (ZOFRAN) 8 MG tablet Take 1 tablet two times a day starting the day after chemo for 3 days. Then take 1 tab two times a day as needed for nausea or vomiting.  30 tablet  1  . oxyCODONE-acetaminophen (TYLOX) 5-500 MG per capsule Take 1 capsule by mouth as needed.      . prochlorperazine (COMPAZINE) 10 MG tablet Take 1  tablet (10 mg total) by mouth every 6 (six) hours as needed (Nausea or vomiting).  30 tablet  1  . prochlorperazine (COMPAZINE) 25 MG suppository Place 1 suppository (25 mg total) rectally every 12 (twelve) hours as needed for nausea.  12 suppository  3    Allergies:  Allergies  Allergen Reactions  . Hydrocodone-Acetaminophen Itching and Swelling  . Lantus Itching, Nausea And Vomiting and Swelling  . Nph Iletin I (Insulin  Isophane, Mixed Nph) Itching, Nausea And Vomiting and Swelling    Physical Exam: Filed Vitals:   05/04/11 1055  BP: 154/86  Pulse: 106  Temp: 98.4 F (36.9 C)  Weight: 186 lbs. HEENT:  Sclerae anicteric, conjunctivae pink.  Oropharynx clear.  No mucositis or candidiasis.   Nodes:  No cervical, supraclavicular, or axillary lymphadenopathy palpated.  Breast Exam:  Deferred.   Lungs:  Clear to auscultation bilaterally.  No crackles, rhonchi, or wheezes.   Heart:  Regular rate and rhythm.   Extremities: Evidence of left upper extremity fullness consistent with lymphedema. No evidence of palpable venous cords, erythema or evidence of streaking. Good range of motion.     Lab Results: Lab Results  Component Value Date   WBC 5.4 05/04/2011   HGB 11.9 05/04/2011   HCT 34.9 05/04/2011   MCV 86.1 05/04/2011   PLT 285 05/04/2011   NEUTROABS 3.7 05/04/2011     Chemistry      Component Value Date/Time   NA 139 05/04/2011 1042   K 3.8 05/04/2011 1042   CL 102 05/04/2011 1042   CO2 28 05/04/2011 1042   BUN 8 05/04/2011 1042   CREATININE 0.68 05/04/2011 1042      Component Value Date/Time   CALCIUM 9.4 05/04/2011 1042   ALKPHOS 169* 05/04/2011 1042   AST 31 05/04/2011 1042   ALT 45* 05/04/2011 1042   BILITOT 0.5 05/04/2011 1042         Assessment:  Annsley is a 41 year old [place] woman with a newly diagnosed pathologically staged T2, N2 a triple negative left breast carcinoma, s/p left modified radical mastectomy with axillary lymph node dissection on 02/21/2011. She is participating in NSABP B-49, and has been randomized to 6 cycles of every 3 week Taxotere/Cytoxan with Neulasta support on day 2, held with this cycle. Due for day 1 cycle 3 on 05/08/2011. 2. Insulin pump dependent diabetic. 3. Severe afebrile neutropenia due to Neulasta being held on day 2, normalization of total white blood cell count and ANC. 4. Excessive eye tearing, secondary to Taxotere-based therapy, improving on  lubricating eyedrops. 5. Nailbed sensitivity, secondary to Taxotere-based therapy, improvement with utilization of vinegar soaks. 6. Left upper extremity lymphedema.  Case reviewed with Dr. Pierce Crane.  ECOG: 0   Plan:  Yameli will return on 05/08/2011 for repeat labs, followup exam prior to day 1 cycle 3 of every 3 week Taxotere/Cytoxan with reintroduction of Neulasta for granulocyte support per NSABP B-39 protocol. We will refer her to the physical therapy Department for lymphedema treatment of her low left upper extremity. This plan was reviewed with the patient, who voices understanding and agreement.  She knows to call with any changes or problems.    Chameka Mcmullen T, PA-C 05/04/2011

## 2011-05-04 NOTE — Telephone Encounter (Signed)
Gv pt appts for april-may2013.  called april @ [facility] to [facility] her aware of referral for pt

## 2011-05-08 ENCOUNTER — Encounter: Payer: Self-pay | Admitting: Physician Assistant

## 2011-05-08 ENCOUNTER — Encounter: Payer: Self-pay | Admitting: *Deleted

## 2011-05-08 ENCOUNTER — Ambulatory Visit: Payer: BC Managed Care – PPO | Admitting: Physician Assistant

## 2011-05-08 ENCOUNTER — Other Ambulatory Visit (HOSPITAL_BASED_OUTPATIENT_CLINIC_OR_DEPARTMENT_OTHER): Payer: BC Managed Care – PPO | Admitting: Lab

## 2011-05-08 ENCOUNTER — Ambulatory Visit (HOSPITAL_BASED_OUTPATIENT_CLINIC_OR_DEPARTMENT_OTHER): Payer: BC Managed Care – PPO

## 2011-05-08 VITALS — BP 142/96 | HR 94 | Temp 97.5°F | Ht 65.0 in | Wt 183.4 lb

## 2011-05-08 DIAGNOSIS — C50912 Malignant neoplasm of unspecified site of left female breast: Secondary | ICD-10-CM

## 2011-05-08 DIAGNOSIS — Z5111 Encounter for antineoplastic chemotherapy: Secondary | ICD-10-CM

## 2011-05-08 DIAGNOSIS — E1065 Type 1 diabetes mellitus with hyperglycemia: Secondary | ICD-10-CM

## 2011-05-08 DIAGNOSIS — C50919 Malignant neoplasm of unspecified site of unspecified female breast: Secondary | ICD-10-CM | POA: Diagnosis not present

## 2011-05-08 LAB — COMPREHENSIVE METABOLIC PANEL
ALT: 34 U/L (ref 0–35)
AST: 22 U/L (ref 0–37)
Albumin: 3.8 g/dL (ref 3.5–5.2)
Alkaline Phosphatase: 156 U/L — ABNORMAL HIGH (ref 39–117)
Calcium: 10.1 mg/dL (ref 8.4–10.5)
Chloride: 99 mEq/L (ref 96–112)
Potassium: 3.7 mEq/L (ref 3.5–5.3)
Sodium: 135 mEq/L (ref 135–145)
Total Protein: 7.1 g/dL (ref 6.0–8.3)

## 2011-05-08 LAB — CBC WITH DIFFERENTIAL/PLATELET
BASO%: 0.6 % (ref 0.0–2.0)
EOS%: 0.1 % (ref 0.0–7.0)
HCT: 34.9 % (ref 34.8–46.6)
MCH: 29.1 pg (ref 25.1–34.0)
MCHC: 33.6 g/dL (ref 31.5–36.0)
MONO#: 0.5 10*3/uL (ref 0.1–0.9)
NEUT%: 87.8 % — ABNORMAL HIGH (ref 38.4–76.8)
RBC: 4.03 10*6/uL (ref 3.70–5.45)
RDW: 14.9 % — ABNORMAL HIGH (ref 11.2–14.5)
WBC: 17.6 10*3/uL — ABNORMAL HIGH (ref 3.9–10.3)
lymph#: 1.5 10*3/uL (ref 0.9–3.3)
nRBC: 0 % (ref 0–0)

## 2011-05-08 MED ORDER — LORAZEPAM 2 MG/ML IJ SOLN
0.5000 mg | Freq: Once | INTRAMUSCULAR | Status: AC
  Administered 2011-05-08: 0.5 mg via INTRAVENOUS

## 2011-05-08 MED ORDER — DOCETAXEL CHEMO INJECTION 160 MG/16ML
75.0000 mg/m2 | Freq: Once | INTRAVENOUS | Status: AC
  Administered 2011-05-08: 150 mg via INTRAVENOUS
  Filled 2011-05-08: qty 15

## 2011-05-08 MED ORDER — SODIUM CHLORIDE 0.9 % IV SOLN
600.0000 mg/m2 | Freq: Once | INTRAVENOUS | Status: AC
  Administered 2011-05-08: 1160 mg via INTRAVENOUS
  Filled 2011-05-08: qty 58

## 2011-05-08 MED ORDER — ONDANSETRON 16 MG/50ML IVPB (CHCC)
16.0000 mg | Freq: Once | INTRAVENOUS | Status: AC
  Administered 2011-05-08: 16 mg via INTRAVENOUS

## 2011-05-08 MED ORDER — SODIUM CHLORIDE 0.9 % IV SOLN
Freq: Once | INTRAVENOUS | Status: AC
  Administered 2011-05-08: 11:00:00 via INTRAVENOUS

## 2011-05-08 MED ORDER — DEXAMETHASONE SODIUM PHOSPHATE 4 MG/ML IJ SOLN
8.0000 mg | Freq: Once | INTRAMUSCULAR | Status: AC
  Administered 2011-05-08: 8 mg via INTRAVENOUS

## 2011-05-08 NOTE — Progress Notes (Unsigned)
Hematology and Oncology Follow Up Visit  [name] [id number] 10/09/1970 41 y.o. 05/08/2011    HPI: [name] is a 41 year old [place] woman with a newly diagnosed pathologically staged T2, N2 a triple negative left breast carcinoma, s/p left modified radical mastectomy with axillary lymph node dissection on 02/21/2011. She is participating in NSABP B-49, and has been randomized to 6 cycles of every 3 week Taxotere/Cytoxan with Neulasta support on day 2, held with cycle 2. Due for day 1 cycle 3 on 05/08/2011. 2. Insulin pump dependent diabetic. 3. Severe afebrile neutropenia due to Neulasta being held on day 2, normalization of total white blood cell count and ANC, though to be restarted with cycle 3. 4. Excessive eye tearing, secondary to Taxotere-based therapy, improving on lubricating eyedrops. 5. Nailbed sensitivity, secondary to Taxotere-based therapy, improvement with utilization of vinegar soaks. 6. Left upper extremity lymphedema,  Interim History:   [name] is seen today for followup, prior to day 1 cycle 3 of 6 planned every 3 week doses of Taxotere/Cytoxan with reintroduction of Neulasta for granulocyte support on day 2 according to NSABP B-49 protocol. Overall she is feeling well, denying any unexplained fevers, chills, or night sweats, though she's had significant hot flashes exacerbation over the last day which we feel is secondary to her dexamethasone premeds. Of note she does take gabapentin 300 mg by mouth 3 times a day for underlying neuropathy secondary to her diabetes. Her appetite has been poor excellent". She denies any nausea, emesis, diarrhea or constipation issues. She has had no flare of neuropathy symptoms, no PPE changes. She does have excessive eye tearing and is utilizing lubricating eyedrops effectively. Her blood sugars have been under fair control, her endocrinologist has doubled her basal rate for her insulin pump over the next 3 days, she'll  be monitoring her sugars very closely.  A detailed review of systems is otherwise noncontributory as noted below.  Review of Systems: Constitutional:  no weight loss, fever, night sweats and feels well Eyes: uses glasses ENT: no complaints Cardiovascular: no chest pain or dyspnea on exertion Respiratory: no cough, shortness of breath, or wheezing Neurological: no TIA or stroke symptoms Dermatological: negative Gastrointestinal: no abdominal pain, change in bowel habits, or black or bloody stools Genito-Urinary: no dysuria, trouble voiding, or hematuria Hematological and Lymphatic: negative Breast: negative Musculoskeletal: Evidence of left upper extremity edema, improved as compared to last week. Remaining ROS negative.   Medications:   I have reviewed the patient's current medications.  Current Outpatient Prescriptions  Medication Sig Dispense Refill  . dexamethasone (DECADRON) 4 MG tablet Take 2 tablets two times a day the day before Taxotere. Then take 2 tabs two times a day starting the day after chemo for 3 days.  30 tablet  1  . gabapentin (NEURONTIN) 300 MG capsule Take 300 mg by mouth 3 (three) times daily.      . Insulin Human (INSULIN PUMP) 100 unit/ml SOLN Inject into the skin continuous.      Marland Kitchen lidocaine-prilocaine (EMLA) cream Apply topically as needed.      Marland Kitchen lisinopril (PRINIVIL,ZESTRIL) 10 MG tablet Take 10 mg by mouth daily. For blood pressure       . LORazepam (ATIVAN) 0.5 MG tablet Take 1 tablet (0.5 mg total) by mouth every 6 (six) hours as needed (Nausea or vomiting).  30 tablet  0  . ondansetron (ZOFRAN) 8 MG tablet Take 1 tablet two times a day starting the day after chemo for 3 days. Then  take 1 tab two times a day as needed for nausea or vomiting.  30 tablet  1  . oxyCODONE-acetaminophen (TYLOX) 5-500 MG per capsule Take 1 capsule by mouth as needed.      . prochlorperazine (COMPAZINE) 10 MG tablet Take 1 tablet (10 mg total) by mouth every 6 (six) hours as  needed (Nausea or vomiting).  30 tablet  1  . prochlorperazine (COMPAZINE) 25 MG suppository Place 1 suppository (25 mg total) rectally every 12 (twelve) hours as needed for nausea.  12 suppository  3  . fluconazole (DIFLUCAN) 100 MG tablet Take 1 tablet (100 mg total) by mouth daily.  30 tablet  0    Allergies:  Allergies  Allergen Reactions  . Hydrocodone-Acetaminophen Itching and Swelling  . Lantus Itching, Nausea And Vomiting and Swelling  . Nph Iletin I (Insulin Isophane, Mixed Nph) Itching, Nausea And Vomiting and Swelling    Physical Exam: Filed Vitals:   05/08/11 0959  BP: 142/96  Pulse: 94  Temp: 97.5 F (36.4 C)  Weight: 183 lbs. HEENT:  Sclerae anicteric, conjunctivae pink.  Oropharynx clear.  No mucositis or candidiasis.   Nodes:  No cervical, supraclavicular, or axillary lymphadenopathy palpated.  Breast Exam:  Deferred.   Lungs:  Clear to auscultation bilaterally.  No crackles, rhonchi, or wheezes.   Heart:  Regular rate and rhythm. ABD:  Soft, nontender with normal bowel sounds. Insulin pump in place. Extremities: Evidence of left upper extremity fullness consistent with lymphedema. No evidence of palpable venous cords, erythema or evidence of streaking. Good range of motion.   NEURO: Alert and oriented x3.  Lab Results: Lab Results  Component Value Date   WBC 17.6* 05/08/2011   HGB 11.7 05/08/2011   HCT 34.9 05/08/2011   MCV 86.6 05/08/2011   PLT 306 05/08/2011   NEUTROABS 15.5* 05/08/2011     Chemistry      Component Value Date/Time   NA 139 05/04/2011 1042   K 3.8 05/04/2011 1042   CL 102 05/04/2011 1042   CO2 28 05/04/2011 1042   BUN 8 05/04/2011 1042   CREATININE 0.68 05/04/2011 1042      Component Value Date/Time   CALCIUM 9.4 05/04/2011 1042   ALKPHOS 169* 05/04/2011 1042   AST 31 05/04/2011 1042   ALT 45* 05/04/2011 1042   BILITOT 0.5 05/04/2011 1042         Assessment:  [name] is a 41 year old [place] woman with a newly diagnosed pathologically  staged T2, N2 a triple negative left breast carcinoma, s/p left modified radical mastectomy with axillary lymph node dissection on 02/21/2011. She is participating in NSABP B-49, and has been randomized to 6 cycles of every 3 week Taxotere/Cytoxan with Neulasta support on day 2, held with cycle 2. Due for day 1 cycle 3 on 05/08/2011. 2. Insulin pump dependent diabetic. 3. Severe afebrile neutropenia due to Neulasta being held on day 2, normalization of total white blood cell count and ANC, though to be restarted with cycle 3. 4. Excessive eye tearing, secondary to Taxotere-based therapy, improving on lubricating eyedrops. 5. Nailbed sensitivity, secondary to Taxotere-based therapy, improvement with utilization of vinegar soaks. 6. Left upper extremity lymphedema, improved as compared to last week. 7. Hot flash exacerbation  Case reviewed with Dr. Pierce Crane.  ECOG: 0   Plan:  [name] will receive day 1 cycle 3 today of every 3 week Taxotere/Cytoxan with reintroduction of Neulasta for granulocyte support per NSABP B-39 protocol. She will utilize Claritin daily  for the next week, she also has Tylox for backup in the event of bone pain associated with Neulasta. Of note, she'll also increase her gabapentin 600 mg with the evening dose, continue 300 mg in the morning in the day. Hopefully, this will ease her nighttime hot flashes. I will see her back in 1 week's time for nadir assessment, though sooner if any should arise. This plan was reviewed with the patient, who voices understanding and agreement.  She knows to call with any changes or problems.    Jdyn Parkerson T, PA-C 05/08/2011

## 2011-05-08 NOTE — Progress Notes (Signed)
05/08/11- B-49 cycle 3, day 1 study notes.  The pt was into the cancer center today with her co-pastor for her cycle 3 assessments.  The pt reports that she is ready for her cycle 3 treatment.  She confirmed that she began her decadron premedications yesterday (05/07/11) in anticipation of her treatment today.    She was seen recently by her endocrinologist, and he adjusted her basal rate on her pump to cover the days that she takes the decadron.  The pt's glucose today was 262 (hyperglycemia -grade 3).  The pt was seen and examined today by Debbora Presto, PA.  The PA felt that the grade 3 hyperglycemia was not a "clinically significant AE".  She wanted the pt to be treated today and maintain her dose.  Of note, the pt had a grade 1 ALT (3 times ULN) event during the cycle.  The PA also did not feel that this was a clinically significant AE.  The pt has maintained a grade 1 Alkaline phosphatase, and no dose modifications are required for grade 1 toxicities.  The pt denied any worsening neuropathy.  She reports a great appetite.  She has episodic mild nausea (grade 1), and she states anti-nausea medications are not needed.  She denies any heartburn.  She still has left eye tearing, but she states the drops have helped her - watery eyes -grade 2.  She has some fatigue (grade 1), but she reports that she is able to perform her activities.  ECOG=0.  She complains of moderate hot flashes at night.hot flashes-grade 2.  The pt has grade 2 hypertension with noted systolic and diastolic elevations since baseline.  The pt remains on lisinopril.   The PA informed the pt that she will definitely need the Neulasta on day 2.  The pt verbalized understanding.  The pt has some taste alterations, but her weight has remained stable since baseline.  The research nurse gave Morrie Sheldon, chemo nurse, the infusion sign for the B-49 study.  The pt is aware of her appts.

## 2011-05-09 ENCOUNTER — Ambulatory Visit (HOSPITAL_BASED_OUTPATIENT_CLINIC_OR_DEPARTMENT_OTHER): Payer: BC Managed Care – PPO

## 2011-05-09 VITALS — BP 140/86 | HR 107 | Temp 97.3°F

## 2011-05-09 DIAGNOSIS — Z5189 Encounter for other specified aftercare: Secondary | ICD-10-CM | POA: Diagnosis not present

## 2011-05-09 DIAGNOSIS — C50919 Malignant neoplasm of unspecified site of unspecified female breast: Secondary | ICD-10-CM

## 2011-05-09 MED ORDER — PEGFILGRASTIM INJECTION 6 MG/0.6ML
6.0000 mg | Freq: Once | SUBCUTANEOUS | Status: AC
  Administered 2011-05-09: 6 mg via SUBCUTANEOUS

## 2011-05-09 NOTE — Patient Instructions (Signed)
Pt in for neulasta.  Birdena Jubilee RN from research in with patient.  Pt has no complaints and per pt and Nikki RN there is a plan in place to help prevent the severe bone pain she has experienced with neulasta previously.  Pt discharged home with instructions to call with questions and concerns.

## 2011-05-15 ENCOUNTER — Other Ambulatory Visit: Payer: Self-pay | Admitting: *Deleted

## 2011-05-15 ENCOUNTER — Other Ambulatory Visit: Payer: Self-pay | Admitting: Certified Registered Nurse Anesthetist

## 2011-05-15 ENCOUNTER — Other Ambulatory Visit: Payer: BC Managed Care – PPO | Admitting: Lab

## 2011-05-15 ENCOUNTER — Ambulatory Visit (HOSPITAL_BASED_OUTPATIENT_CLINIC_OR_DEPARTMENT_OTHER): Payer: BC Managed Care – PPO | Admitting: Physician Assistant

## 2011-05-15 VITALS — BP 132/83 | HR 128 | Temp 98.6°F | Ht 65.0 in | Wt 183.2 lb

## 2011-05-15 DIAGNOSIS — E8989 Other postprocedural endocrine and metabolic complications and disorders: Secondary | ICD-10-CM

## 2011-05-15 DIAGNOSIS — E1065 Type 1 diabetes mellitus with hyperglycemia: Secondary | ICD-10-CM

## 2011-05-15 DIAGNOSIS — C50919 Malignant neoplasm of unspecified site of unspecified female breast: Secondary | ICD-10-CM

## 2011-05-15 DIAGNOSIS — C50912 Malignant neoplasm of unspecified site of left female breast: Secondary | ICD-10-CM

## 2011-05-15 DIAGNOSIS — I890 Lymphedema, not elsewhere classified: Secondary | ICD-10-CM

## 2011-05-15 DIAGNOSIS — C50419 Malignant neoplasm of upper-outer quadrant of unspecified female breast: Secondary | ICD-10-CM

## 2011-05-15 LAB — COMPREHENSIVE METABOLIC PANEL
AST: 49 U/L — ABNORMAL HIGH (ref 0–37)
Albumin: 3.3 g/dL — ABNORMAL LOW (ref 3.5–5.2)
Alkaline Phosphatase: 163 U/L — ABNORMAL HIGH (ref 39–117)
Glucose, Bld: 191 mg/dL — ABNORMAL HIGH (ref 70–99)
Potassium: 3.7 mEq/L (ref 3.5–5.3)
Sodium: 137 mEq/L (ref 135–145)
Total Bilirubin: 0.5 mg/dL (ref 0.3–1.2)
Total Protein: 6.5 g/dL (ref 6.0–8.3)

## 2011-05-15 LAB — CBC WITH DIFFERENTIAL/PLATELET
BASO%: 2.5 % — ABNORMAL HIGH (ref 0.0–2.0)
Basophils Absolute: 0.2 10*3/uL — ABNORMAL HIGH (ref 0.0–0.1)
EOS%: 0.4 % (ref 0.0–7.0)
MCH: 28.9 pg (ref 25.1–34.0)
MCHC: 33.8 g/dL (ref 31.5–36.0)
MCV: 85.4 fL (ref 79.5–101.0)
MONO%: 24.9 % — ABNORMAL HIGH (ref 0.0–14.0)
RBC: 3.98 10*6/uL (ref 3.70–5.45)
RDW: 14.6 % — ABNORMAL HIGH (ref 11.2–14.5)
lymph#: 1.6 10*3/uL (ref 0.9–3.3)

## 2011-05-15 MED ORDER — TRIAMTERENE-HCTZ 37.5-25 MG PO CAPS: 1.0000 | ORAL_CAPSULE | ORAL | Status: DC

## 2011-05-15 NOTE — Progress Notes (Signed)
Hematology and Oncology Follow Up Visit  [name] [id number] [birth year] 41 y.o. 05/15/2011    HPI: [name] is a 41 year old [place] woman with a newly diagnosed pathologically staged T2, N2 a triple negative left breast carcinoma, s/p left modified radical mastectomy with axillary lymph node dissection on 02/21/2011. She is participating in NSABP B-49, and has been randomized to 6 cycles of every 3 week Taxotere/Cytoxan with Neulasta support on day 2, held with cycle 2. Currently day 7 cycle 3. 2. Insulin pump dependent diabetic. 3. Severe afebrile neutropenia due to Neulasta being held on day 2, normalization of total white blood cell count and ANC, though to be restarted with cycle 3. 4. Excessive eye tearing, secondary to Taxotere-based therapy, improving on lubricating eyedrops. 5. Nailbed sensitivity, secondary to Taxotere-based therapy, improvement with utilization of vinegar soaks. 6. Left upper extremity lymphedema,  Interim History:   [name] is seen today for followup after her third cycle of every three-week Taxotere/Cytoxan according to NSABP B-49 protocol. Neulasta was utilized on day 2, and actually she is done well in regards to the prior noted bone pain, she did have some discomfort store and the last 24 hours but she is utilizing Tylox effectively. Her blood sugars have also been under better control, she has not noted any greater than 250. She denies any fevers, chills or night sweats. No shortness of breath or chest pain. She has some mouth sensitivity but denies any thrush. She denies heartburn, nausea, emesis, frank diarrhea but she does have chronic constipation for which he is utilizing stool softeners effectively. She continues to have eye tearing but is utilizing lubricating eyedrops. She denies any PPE changes or neuropathy symptoms. A detailed review of systems is otherwise noncontributory as noted below.  Review of Systems: Constitutional:   Fatigue. Eyes: uses glasses ENT: no complaints Cardiovascular: no chest pain or dyspnea on exertion Respiratory: no cough, shortness of breath, or wheezing Neurological: no TIA or stroke symptoms Dermatological: negative Gastrointestinal: no abdominal pain, change in bowel habits, or black or bloody stools Genito-Urinary: no dysuria, trouble voiding, or hematuria Hematological and Lymphatic: negative Breast: negative Musculoskeletal: Evidence of left upper extremity edema, persistant. Remaining ROS negative.   Medications:   I have reviewed the patient's current medications.  Current Outpatient Prescriptions  Medication Sig Dispense Refill  . dexamethasone (DECADRON) 4 MG tablet Take 2 tablets two times a day the day before Taxotere. Then take 2 tabs two times a day starting the day after chemo for 3 days.  30 tablet  1  . fluconazole (DIFLUCAN) 100 MG tablet Take 1 tablet (100 mg total) by mouth daily.  30 tablet  0  . gabapentin (NEURONTIN) 300 MG capsule Take 300 mg by mouth 3 (three) times daily.      . Insulin Human (INSULIN PUMP) 100 unit/ml SOLN Inject into the skin continuous.      Marland Kitchen lidocaine-prilocaine (EMLA) cream Apply topically as needed.      Marland Kitchen lisinopril (PRINIVIL,ZESTRIL) 10 MG tablet Take 10 mg by mouth daily. For blood pressure       . LORazepam (ATIVAN) 0.5 MG tablet Take 1 tablet (0.5 mg total) by mouth every 6 (six) hours as needed (Nausea or vomiting).  30 tablet  0  . ondansetron (ZOFRAN) 8 MG tablet Take 1 tablet two times a day starting the day after chemo for 3 days. Then take 1 tab two times a day as needed for nausea or vomiting.  30 tablet  1  .  oxyCODONE-acetaminophen (TYLOX) 5-500 MG per capsule Take 1 capsule by mouth as needed.      . prochlorperazine (COMPAZINE) 10 MG tablet Take 1 tablet (10 mg total) by mouth every 6 (six) hours as needed (Nausea or vomiting).  30 tablet  1  . prochlorperazine (COMPAZINE) 25 MG suppository Place 1 suppository (25 mg  total) rectally every 12 (twelve) hours as needed for nausea.  12 suppository  3  . triamterene-hydrochlorothiazide (DYAZIDE) 37.5-25 MG per capsule Take 1 each (1 capsule total) by mouth every morning.  30 capsule  0    Allergies:  Allergies  Allergen Reactions  . Hydrocodone-Acetaminophen Itching and Swelling  . Lantus Itching, Nausea And Vomiting and Swelling  . Nph Iletin I (Insulin Isophane, Mixed Nph) Itching, Nausea And Vomiting and Swelling    Physical Exam: Filed Vitals:   05/15/11 0920  BP: 132/83  Pulse: 128  Temp: 98.6 F (37 C)  Weight: 183 lbs. HEENT:  Sclerae anicteric, conjunctivae pink.  Oropharynx clear.  Mild mucositis but no evidence of oral candidiasis.   Nodes:  No cervical, supraclavicular, or axillary lymphadenopathy palpated.  Breast Exam:  Deferred.   Lungs:  Clear to auscultation bilaterally.  No crackles, rhonchi, or wheezes.   Heart:  Regular rate and rhythm. ABD:  Soft, nontender with normal bowel sounds. Insulin pump in place. Extremities: Evidence of left upper extremity fullness consistent with lymphedema. No evidence of palpable venous cords, erythema or evidence of streaking. Good range of motion.   NEURO: Alert and oriented x3.  Lab Results: Lab Results  Component Value Date   WBC 7.3 05/15/2011   HGB 11.5* 05/15/2011   HCT 34.0* 05/15/2011   MCV 85.4 05/15/2011   PLT 241 05/15/2011   NEUTROABS 3.7 05/15/2011     Chemistry      Component Value Date/Time   NA 137 05/15/2011 0851   K 3.7 05/15/2011 0851   CL 100 05/15/2011 0851   CO2 28 05/15/2011 0851   BUN 8 05/15/2011 0851   CREATININE 0.66 05/15/2011 0851      Component Value Date/Time   CALCIUM 9.5 05/15/2011 0851   ALKPHOS 163* 05/15/2011 0851   AST 49* 05/15/2011 0851   ALT 75* 05/15/2011 0851   BILITOT 0.5 05/15/2011 0851         Assessment:  [name] is a 41 year old [place] woman with a newly diagnosed pathologically staged T2, N2 a triple negative left breast  carcinoma, s/p left modified radical mastectomy with axillary lymph node dissection on 02/21/2011. She is participating in NSABP B-49, and has been randomized to 6 cycles of every 3 week Taxotere/Cytoxan with Neulasta support on day 2, held with cycle 2.  Currently day 7 cycle 3. 2. Insulin pump dependent diabetic. 3. History of severe afebrile neutropenia due to Neulasta being held on day 2, normalization of total white blood cell count and ANC, though restarted with cycle 3, normal ANC today. 4. Excessive eye tearing, secondary to Taxotere-based therapy, improving on lubricating eyedrops. 5. Nailbed sensitivity, secondary to Taxotere-based therapy, improvement with utilization of vinegar soaks. 6. Left upper extremity lymphedema, slight increase as compared to last week. Pending [facility]hedema Clinic appt. next week. 7. Fatigue.  Case reviewed with Dr. Pierce Crane.  ECOG: 1   Plan:  [name] has been given a trial of Dyazide 37.5/25mg  tabs in the hopes of alleviating some of her left arm lymphedema symptoms. She will check her blood pressure over the next couple of days carefully. She will  return in 2 weeks' time for a followup exam prior to day 1 cycle 4 of every three-week Taxotere/Cytoxan. Again Neulasta will be utilized on day 2.  She knows to call with any changes or problems.    Alayja Armas T, PA-C 05/15/2011

## 2011-05-15 NOTE — Patient Instructions (Signed)
1. We are going to try Dyazide 37.5/25 for a couple of days-hopefully this will relieve the L arm lymphedema      MONITOR BLOOD PRESSURE PLEASE

## 2011-05-16 DIAGNOSIS — C50919 Malignant neoplasm of unspecified site of unspecified female breast: Secondary | ICD-10-CM | POA: Diagnosis not present

## 2011-05-18 ENCOUNTER — Other Ambulatory Visit: Payer: Self-pay | Admitting: *Deleted

## 2011-05-18 MED ORDER — ZOLPIDEM TARTRATE 5 MG PO TABS: 5.0000 mg | ORAL_TABLET | Freq: Every evening | ORAL | Status: DC | PRN

## 2011-05-22 ENCOUNTER — Ambulatory Visit: Payer: BC Managed Care – PPO | Attending: Physician Assistant | Admitting: Physical Therapy

## 2011-05-22 DIAGNOSIS — I890 Lymphedema, not elsewhere classified: Secondary | ICD-10-CM | POA: Insufficient documentation

## 2011-05-22 DIAGNOSIS — IMO0001 Reserved for inherently not codable concepts without codable children: Secondary | ICD-10-CM | POA: Insufficient documentation

## 2011-05-26 ENCOUNTER — Encounter: Payer: Self-pay | Admitting: *Deleted

## 2011-05-26 NOTE — Progress Notes (Signed)
05/26/11 at 3:13pm- Rn submitted a SAE for the pt's grade 4 hyperglycemia that occurred in cycle 1 today.  Rn contacted Velta Addison at [facility] on 04/17/11 about the grade 4 toxicity and asked how to report the event.  Vance Gather originally advised research nurse to not report the event in AdEERS.  She advised nurse to report it on the B-49 AE form.  Rn made a comment on the AE form for cycle 1 alerting data management that Vance Gather advised nurse to not report the hyperglycemia as a SAE.  Rn was later contacted by Velta Addison on 05/18/11 (message was forwarded to Marilu Favre, Teacher, English as a foreign language) and was told that she instructed our site incorrectly with regards to the grade 4 toxicity.  She told Rn to complete an AdEERS report for the grade 4 hyperglycemia and to amend/remove the toxicity from the AE form.  She asked that the research nurse send her documentation supporting this submission.  Rn spoke to [place] and there was a discussion about the event and the late reporting.  Vance Gather said that it was "alright" that it was late since the site was told to not report it as an AdEERS originally.  Rn started the report on 05/18/11.  On 05/19/11, Rn called Vance Gather with a question about how to report the "self reported" lab value of "greater than 500".  Vance Gather said that she did not know that it was a self-reported lab value.  She discussed it with her boss, Kristeen Miss. It was decided that the nurse should withdraw the AdEERS and to not report it since it was a self-reported value.  The research nurse was very uncomfortable with this decision.  The research nurse spoke at length with her manager, Marilu Favre.  Marilu Favre contacted [employer] (2nd discussion) about the issue of reporting "self-reported" adverse events.   Kristeen Miss ([facility]) called on 05/25/11, and she said that Dr. Brunilda Payor advised our site to proceed with reporting the grade 4 event in Adeers.  Therefore, the site submitted the pending  expedited report.  Dr. Donnie Coffin reviewed the report before submission.  Rn contacted the helpdesk at [employer], and they advised Rn to remove the lab section since an actual documented value was not available.Rn submitted the report successfully on 05/26/11.  Supporting documentation was sent to [facility] on 05/26/11.

## 2011-05-29 ENCOUNTER — Telehealth: Payer: Self-pay | Admitting: Oncology

## 2011-05-29 ENCOUNTER — Encounter: Payer: Self-pay | Admitting: Physician Assistant

## 2011-05-29 ENCOUNTER — Ambulatory Visit: Payer: BC Managed Care – PPO | Admitting: Physician Assistant

## 2011-05-29 ENCOUNTER — Other Ambulatory Visit: Payer: BC Managed Care – PPO

## 2011-05-29 ENCOUNTER — Encounter: Payer: Self-pay | Admitting: *Deleted

## 2011-05-29 ENCOUNTER — Ambulatory Visit (HOSPITAL_BASED_OUTPATIENT_CLINIC_OR_DEPARTMENT_OTHER): Payer: BC Managed Care – PPO

## 2011-05-29 ENCOUNTER — Ambulatory Visit: Payer: BC Managed Care – PPO | Admitting: Physical Therapy

## 2011-05-29 VITALS — BP 128/83 | HR 123 | Temp 97.7°F | Ht 65.0 in | Wt 188.2 lb

## 2011-05-29 DIAGNOSIS — C50912 Malignant neoplasm of unspecified site of left female breast: Secondary | ICD-10-CM

## 2011-05-29 DIAGNOSIS — Z5111 Encounter for antineoplastic chemotherapy: Secondary | ICD-10-CM

## 2011-05-29 DIAGNOSIS — C50919 Malignant neoplasm of unspecified site of unspecified female breast: Secondary | ICD-10-CM | POA: Diagnosis not present

## 2011-05-29 LAB — CBC WITH DIFFERENTIAL/PLATELET
Basophils Absolute: 0.1 10*3/uL (ref 0.0–0.1)
EOS%: 0 % (ref 0.0–7.0)
Eosinophils Absolute: 0 10*3/uL (ref 0.0–0.5)
HGB: 10.6 g/dL — ABNORMAL LOW (ref 11.6–15.9)
LYMPH%: 6.7 % — ABNORMAL LOW (ref 14.0–49.7)
MCH: 28.7 pg (ref 25.1–34.0)
MCV: 89.3 fL (ref 79.5–101.0)
MONO%: 2.1 % (ref 0.0–14.0)
NEUT#: 14 10*3/uL — ABNORMAL HIGH (ref 1.5–6.5)
Platelets: 415 10*3/uL — ABNORMAL HIGH (ref 145–400)

## 2011-05-29 LAB — COMPREHENSIVE METABOLIC PANEL
AST: 15 U/L (ref 0–37)
Albumin: 3.6 g/dL (ref 3.5–5.2)
Alkaline Phosphatase: 153 U/L — ABNORMAL HIGH (ref 39–117)
Potassium: 4.1 mEq/L (ref 3.5–5.3)
Sodium: 135 mEq/L (ref 135–145)
Total Bilirubin: 0.6 mg/dL (ref 0.3–1.2)
Total Protein: 6.6 g/dL (ref 6.0–8.3)

## 2011-05-29 MED ORDER — SODIUM CHLORIDE 0.9 % IJ SOLN
10.0000 mL | INTRAMUSCULAR | Status: DC | PRN
  Administered 2011-05-29: 10 mL
  Filled 2011-05-29: qty 10

## 2011-05-29 MED ORDER — HEPARIN SOD (PORK) LOCK FLUSH 100 UNIT/ML IV SOLN
500.0000 [IU] | Freq: Once | INTRAVENOUS | Status: AC | PRN
  Administered 2011-05-29: 500 [IU]
  Filled 2011-05-29: qty 5

## 2011-05-29 MED ORDER — SODIUM CHLORIDE 0.9 % IV SOLN
Freq: Once | INTRAVENOUS | Status: AC
  Administered 2011-05-29: 10:00:00 via INTRAVENOUS

## 2011-05-29 MED ORDER — SODIUM CHLORIDE 0.9 % IV SOLN
600.0000 mg/m2 | Freq: Once | INTRAVENOUS | Status: AC
  Administered 2011-05-29: 1160 mg via INTRAVENOUS
  Filled 2011-05-29: qty 58

## 2011-05-29 MED ORDER — ONDANSETRON 16 MG/50ML IVPB (CHCC)
16.0000 mg | Freq: Once | INTRAVENOUS | Status: AC
  Administered 2011-05-29: 16 mg via INTRAVENOUS

## 2011-05-29 MED ORDER — DEXAMETHASONE SODIUM PHOSPHATE 4 MG/ML IJ SOLN
8.0000 mg | Freq: Once | INTRAMUSCULAR | Status: AC
  Administered 2011-05-29: 8 mg via INTRAVENOUS

## 2011-05-29 MED ORDER — DOCETAXEL CHEMO INJECTION 160 MG/16ML
75.0000 mg/m2 | Freq: Once | INTRAVENOUS | Status: AC
  Administered 2011-05-29: 150 mg via INTRAVENOUS
  Filled 2011-05-29: qty 15

## 2011-05-29 NOTE — Patient Instructions (Signed)
1. Obtain Miralax:)   Use as directed.

## 2011-05-29 NOTE — Progress Notes (Signed)
May 29, 2011. 11:30. [facility] B-49. Cycle 4 Mrs. Landrum-Missildine is in the cancer center today for lab work, physical exam, and to receive cycle 4 of taxotere and cytoxan on the B-49 clinical trial.   She reports that she has had some numbness and tingling in her finger tips and toes not limiting ADLs. She requires the use of laxatives for 2 weeks after receiving chemotherapy. She has taken [place] and benedryl for sleep but still wakes up early.  She does experience fatigue after receiving treatment. Per Ms. Scherrer, her ECOG PS is 1.   Lab results reviewed by Ellen Henri, PA. Patient reports her blood glucose was 403 when she checked it this morning at home.   She does have a small amount of swelling in her left arm on her operative side and has begun physical therapy for lymphedema. She has an appointment at PT this afternoon.  Sign for treatment given to D. Alvester Morin, RN.

## 2011-05-29 NOTE — Progress Notes (Signed)
Hematology and Oncology Follow Up Visit  [name] [id number] 11-22-1970 41 y.o. 05/29/2011    HPI: [name] is a 41 year old [place] woman with a newly diagnosed pathologically staged T2, N2 a triple negative left breast carcinoma, s/p left modified radical mastectomy with axillary lymph node dissection on 02/21/2011. She is participating in [facility] B-49, and has been randomized to 6 cycles of every 3 week Taxotere/Cytoxan with Neulasta support on day 2, held with cycle 2. Due for day 1 cycle 4. 2. Insulin pump dependent diabetic. 3. Severe afebrile neutropenia due to Neulasta being held on day 2, normalization of total white blood cell count and ANC, though to be restarted with cycle 3. 4. Excessive eye tearing, secondary to Taxotere-based therapy, improving on lubricating eyedrops. 5. Nailbed sensitivity, secondary to Taxotere-based therapy, improvement with utilization of vinegar soaks. 6. Left upper extremity lymphedema,  Interim History:   [name] is seen today for followup, prior to day 1 cycle 4 of 6 planned every 3 week doses of Taxotere/Cytoxan with reintroduction of Neulasta for granulocyte support on day 2 according to [facility] B-49 protocol. Overall she is feeling well, denying any unexplained fevers, chills, or night sweats. Her appetite has been excellent. She denies any nausea, emesis, diarrhea.  She has noted increased tendency towards constipation. She has noted a bit of increase in neuropathy symptoms, no PPE changes, no effect of her ADLs.  She does have excessive eye tearing and is utilizing lubricating eyedrops effectively. Her blood sugars have been under fair control, though she notes her insulin pump register her blood glucose of 403 this a.m. A detailed review of systems is otherwise noncontributory as noted below.  Review of Systems: Constitutional:  no weight loss, fever, night sweats and feels well Eyes: uses glasses ENT: no  complaints Cardiovascular: no chest pain or dyspnea on exertion Respiratory: no cough, shortness of breath, or wheezing Neurological: no TIA or stroke symptoms Dermatological: negative Gastrointestinal: no abdominal pain, change in bowel habits, or black or bloody stools Genito-Urinary: no dysuria, trouble voiding, or hematuria Hematological and Lymphatic: negative Breast: negative Musculoskeletal: Evidence of left upper extremity edema, improved as compared to last week. Remaining ROS negative.   Medications:   I have reviewed the patient's current medications.  Current Outpatient Prescriptions  Medication Sig Dispense Refill  . dexamethasone (DECADRON) 4 MG tablet Take 2 tablets two times a day the day before Taxotere. Then take 2 tabs two times a day starting the day after chemo for 3 days.  30 tablet  1  . gabapentin (NEURONTIN) 300 MG capsule Take 300 mg by mouth 3 (three) times daily.      . Insulin Human (INSULIN PUMP) 100 unit/ml SOLN Inject into the skin continuous.      Marland Kitchen lidocaine-prilocaine (EMLA) cream Apply topically as needed.      Marland Kitchen lisinopril (PRINIVIL,ZESTRIL) 10 MG tablet Take 10 mg by mouth daily. For blood pressure       . LORazepam (ATIVAN) 0.5 MG tablet Take 1 tablet (0.5 mg total) by mouth every 6 (six) hours as needed (Nausea or vomiting).  30 tablet  0  . ondansetron (ZOFRAN) 8 MG tablet Take 1 tablet two times a day starting the day after chemo for 3 days. Then take 1 tab two times a day as needed for nausea or vomiting.  30 tablet  1  . oxyCODONE-acetaminophen (TYLOX) 5-500 MG per capsule Take 1 capsule by mouth as needed.      . prochlorperazine (COMPAZINE)  10 MG tablet Take 1 tablet (10 mg total) by mouth every 6 (six) hours as needed (Nausea or vomiting).  30 tablet  1  . prochlorperazine (COMPAZINE) 25 MG suppository Place 1 suppository (25 mg total) rectally every 12 (twelve) hours as needed for nausea.  12 suppository  3  . triamterene-hydrochlorothiazide  (DYAZIDE) 37.5-25 MG per capsule Take 1 each (1 capsule total) by mouth every morning.  30 capsule  0  . zolpidem (AMBIEN) 5 MG tablet Take 1 tablet (5 mg total) by mouth at bedtime as needed for sleep.  30 tablet  0    Allergies:  Allergies  Allergen Reactions  . Hydrocodone-Acetaminophen Itching and Swelling  . Lantus Itching, Nausea And Vomiting and Swelling  . Nph Iletin I (Insulin Isophane, Mixed Nph) Itching, Nausea And Vomiting and Swelling    Physical Exam: Filed Vitals:   05/29/11 0911  BP: 128/83  Pulse: 123  Temp: 97.7 F (36.5 C)  Weight: 188 lbs. HEENT:  Sclerae anicteric, conjunctivae pink.  Oropharynx clear.  No mucositis or candidiasis.   Nodes:  No cervical, supraclavicular, or axillary lymphadenopathy palpated.  Breast Exam:  Deferred.   Lungs:  Clear to auscultation bilaterally.  No crackles, rhonchi, or wheezes.   Heart:  Regular rate and rhythm. ABD:  Soft, nontender with normal bowel sounds. Insulin pump in place. Extremities: Evidence of left upper extremity fullness consistent with lymphedema. No evidence of palpable venous cords, erythema or evidence of streaking. Good range of motion.   NEURO: Alert and oriented x3.  Lab Results: Lab Results  Component Value Date   WBC 15.5* 05/29/2011   HGB 10.6* 05/29/2011   HCT 32.8* 05/29/2011   MCV 89.3 05/29/2011   PLT 415* 05/29/2011   NEUTROABS 14.0* 05/29/2011     Chemistry      Component Value Date/Time   NA 135 05/29/2011 0853   K 4.1 05/29/2011 0853   CL 98 05/29/2011 0853   CO2 22 05/29/2011 0853   BUN 17 05/29/2011 0853   CREATININE 0.74 05/29/2011 0853      Component Value Date/Time   CALCIUM 9.4 05/29/2011 0853   ALKPHOS 153* 05/29/2011 0853   AST 15 05/29/2011 0853   ALT 23 05/29/2011 0853   BILITOT 0.6 05/29/2011 0853         Assessment:  [name] is a 41 year old [place] woman with a newly diagnosed pathologically staged T2, N2 a triple negative left breast carcinoma, s/p left  modified radical mastectomy with axillary lymph node dissection on 02/21/2011. She is participating in [facility] B-49, and has been randomized to 6 cycles of every 3 week Taxotere/Cytoxan with Neulasta support on day 2, held with cycle 2. Due for day 1 cycle 4. 2. Insulin pump dependent diabetic. 3. Severe afebrile neutropenia due to Neulasta being held on day 2, normalization of total white blood cell count and ANC, though to be restarted with cycle 3. 4. Excessive eye tearing, secondary to Taxotere-based therapy, improving on lubricating eyedrops. 5. Nailbed sensitivity, secondary to Taxotere-based therapy, improvement with utilization of vinegar soaks. 6. Left upper extremity lymphedema, improved as compared to last week. 7. Increasing fatigability. 8. Increasing peripheral neuropathy not affecting ADLs.  Case reviewed with Dr. Pierce Crane.  ECOG: 1   Plan:  [name] will receive day 1 cycle 4 today of every 3 week Taxotere/Cytoxan with reintroduction of Neulasta for granulocyte support per [facility] B-39 protocol. She will utilize Claritin daily for the next week, she also has Tylox for  backup in the event of bone pain associated with Neulasta.  [name] will initiate MiraLAX to see if this will facilitate more regular bowel movements. She will return  one week's time for nadir assessment to include a CBC with differential. We will need to monitor neuropathy symptoms closely.  This plan was reviewed with the patient, who voices understanding and agreement.  She knows to call with any changes or problems.    Ayo Smoak T, PA-C 05/29/2011

## 2011-05-29 NOTE — Telephone Encounter (Signed)
gve the pt her may 2013 appt calendar 

## 2011-05-29 NOTE — Patient Instructions (Signed)
[facility] Discharge Instructions for Patients Receiving Chemotherapy  Today you received the following chemotherapy agents  taxotere,cytoxan To help prevent nausea and vomiting after your treatment, we encourage you to take your nausea medication  Begin taking it and take it as often as prescribed    If you develop nausea and vomiting that is not controlled by your nausea medication, call the clinic. If it is after clinic hours your family physician or the after hours number for the clinic or go to the Emergency Department.   BELOW ARE SYMPTOMS THAT SHOULD BE REPORTED IMMEDIATELY:  *FEVER GREATER THAN 100.5 F  *CHILLS WITH OR WITHOUT FEVER  NAUSEA AND VOMITING THAT IS NOT CONTROLLED WITH YOUR NAUSEA MEDICATION  *UNUSUAL SHORTNESS OF BREATH  *UNUSUAL BRUISING OR BLEEDING  TENDERNESS IN MOUTH AND THROAT WITH OR WITHOUT PRESENCE OF ULCERS  *URINARY PROBLEMS  *BOWEL PROBLEMS  UNUSUAL RASH Items with * indicate a potential emergency and should be followed up as soon as possible.   I have been informed and understand all the instructions given to me. I know to contact the clinic, my physician, or go to the Emergency Department if any problems should occur. I do not have any questions at this time, but understand that I may call the clinic during office hours   should I have any questions or need assistance in obtaining follow up care.    __________________________________________  _____________  __________ Signature of Patient or Authorized Representative            Date                   Time    __________________________________________ Nurse's Signature

## 2011-05-30 ENCOUNTER — Ambulatory Visit (HOSPITAL_BASED_OUTPATIENT_CLINIC_OR_DEPARTMENT_OTHER): Payer: BC Managed Care – PPO

## 2011-05-30 VITALS — BP 134/86 | HR 100 | Temp 97.7°F

## 2011-05-30 DIAGNOSIS — Z5189 Encounter for other specified aftercare: Secondary | ICD-10-CM

## 2011-05-30 DIAGNOSIS — C50919 Malignant neoplasm of unspecified site of unspecified female breast: Secondary | ICD-10-CM

## 2011-05-30 MED ORDER — PEGFILGRASTIM INJECTION 6 MG/0.6ML
6.0000 mg | Freq: Once | SUBCUTANEOUS | Status: AC
  Administered 2011-05-30: 6 mg via SUBCUTANEOUS
  Filled 2011-05-30: qty 0.6

## 2011-05-31 ENCOUNTER — Ambulatory Visit: Payer: BC Managed Care – PPO | Attending: Physician Assistant | Admitting: Physical Therapy

## 2011-05-31 DIAGNOSIS — IMO0001 Reserved for inherently not codable concepts without codable children: Secondary | ICD-10-CM | POA: Insufficient documentation

## 2011-05-31 DIAGNOSIS — I890 Lymphedema, not elsewhere classified: Secondary | ICD-10-CM | POA: Insufficient documentation

## 2011-06-01 ENCOUNTER — Ambulatory Visit: Payer: BC Managed Care – PPO | Admitting: Physical Therapy

## 2011-06-01 DIAGNOSIS — E1065 Type 1 diabetes mellitus with hyperglycemia: Secondary | ICD-10-CM | POA: Diagnosis not present

## 2011-06-01 DIAGNOSIS — I10 Essential (primary) hypertension: Secondary | ICD-10-CM | POA: Diagnosis not present

## 2011-06-01 DIAGNOSIS — R748 Abnormal levels of other serum enzymes: Secondary | ICD-10-CM | POA: Diagnosis not present

## 2011-06-05 ENCOUNTER — Other Ambulatory Visit (HOSPITAL_BASED_OUTPATIENT_CLINIC_OR_DEPARTMENT_OTHER): Payer: BC Managed Care – PPO | Admitting: Lab

## 2011-06-05 ENCOUNTER — Telehealth: Payer: Self-pay | Admitting: *Deleted

## 2011-06-05 ENCOUNTER — Telehealth: Payer: Self-pay | Admitting: Oncology

## 2011-06-05 ENCOUNTER — Ambulatory Visit: Payer: BC Managed Care – PPO | Admitting: Physical Therapy

## 2011-06-05 ENCOUNTER — Encounter: Payer: Self-pay | Admitting: Physician Assistant

## 2011-06-05 ENCOUNTER — Ambulatory Visit (HOSPITAL_BASED_OUTPATIENT_CLINIC_OR_DEPARTMENT_OTHER): Payer: BC Managed Care – PPO | Admitting: Physician Assistant

## 2011-06-05 VITALS — BP 132/78 | HR 122 | Temp 98.4°F | Ht 65.0 in | Wt 188.2 lb

## 2011-06-05 DIAGNOSIS — E119 Type 2 diabetes mellitus without complications: Secondary | ICD-10-CM

## 2011-06-05 DIAGNOSIS — C50419 Malignant neoplasm of upper-outer quadrant of unspecified female breast: Secondary | ICD-10-CM | POA: Diagnosis not present

## 2011-06-05 DIAGNOSIS — C50912 Malignant neoplasm of unspecified site of left female breast: Secondary | ICD-10-CM

## 2011-06-05 DIAGNOSIS — I972 Postmastectomy lymphedema syndrome: Secondary | ICD-10-CM | POA: Diagnosis not present

## 2011-06-05 DIAGNOSIS — C50919 Malignant neoplasm of unspecified site of unspecified female breast: Secondary | ICD-10-CM

## 2011-06-05 LAB — CBC WITH DIFFERENTIAL/PLATELET
Basophils Absolute: 0.1 10*3/uL (ref 0.0–0.1)
Eosinophils Absolute: 0 10*3/uL (ref 0.0–0.5)
HGB: 9.7 g/dL — ABNORMAL LOW (ref 11.6–15.9)
LYMPH%: 16.1 % (ref 14.0–49.7)
MCH: 29.3 pg (ref 25.1–34.0)
MCV: 88.6 fL (ref 79.5–101.0)
MONO%: 20.2 % — ABNORMAL HIGH (ref 0.0–14.0)
NEUT#: 7.8 10*3/uL — ABNORMAL HIGH (ref 1.5–6.5)
Platelets: 231 10*3/uL (ref 145–400)
RBC: 3.32 10*6/uL — ABNORMAL LOW (ref 3.70–5.45)

## 2011-06-05 MED ORDER — GABAPENTIN 300 MG PO CAPS: 300.0000 mg | ORAL_CAPSULE | Freq: Three times a day (TID) | ORAL | Status: DC

## 2011-06-05 MED ORDER — OXYCODONE-ACETAMINOPHEN 5-500 MG PO CAPS: 1.0000 | ORAL_CAPSULE | ORAL | Status: DC | PRN

## 2011-06-05 NOTE — Telephone Encounter (Signed)
Error, staff message was from [employer].  JMW

## 2011-06-05 NOTE — Telephone Encounter (Signed)
gve the pt her June 2013 appt calendar. Sent michelle an staff message to add the chemo appt

## 2011-06-05 NOTE — Telephone Encounter (Signed)
Per e-mail from Jacki Cones, I have scheduled treatment appt for the patient. Jacki Cones aware appt in computer.  JMW

## 2011-06-05 NOTE — Progress Notes (Signed)
Hematology and Oncology Follow Up Visit  JOHNANNA BAKKE [id number] February 07, 1970 41 y.o. 06/05/2011    HPI: Sherill is a 41 year old [place] woman with a newly diagnosed pathologically staged T2, N2 a triple negative left breast carcinoma, s/p left modified radical mastectomy with axillary lymph node dissection on 02/21/2011. She is participating in [facility] B-49, and has been randomized to 6 cycles of every 3 week Taxotere/Cytoxan with Neulasta support on day 2, held with cycle 2.  Currently day 7 cycle 4 . 2. Insulin pump dependent diabetic.  3. History of severe afebrile neutropenia due to Neulasta being held on day 2, normalization of total white blood cell count and ANC, though restarted with cycle 3, normal ANC today.  4. Excessive eye tearing, secondary to Taxotere-based therapy, improving on lubricating eyedrops.  5. Nailbed sensitivity, secondary to Taxotere-based therapy, improvement with utilization of vinegar soaks.  6. Left upper extremity lymphedema, slight increase as compared to last week .Active treatment in [facility].  7. Fatigue.   Interim History:   Harlynn is seen today for followup after her fourth cycle of every three-week Taxotere/Cytoxan according to [facility] B-49 protocol. Neulasta was utilized on day 2. Again, she is noted low back pain associated with Neulasta, she utilizes Tylox affectively, and does request a refill prescription both for Tylox, and gabapentin which she ran out of about one week ago.  She has appreciated neuropathy of both her hands left greater than right but she attributes part of this to the compression wrapping that is being utilized for her history of lymphedema. She denies persistent nausea, no diarrhea constipation issues. She had a flare of heartburn but it has totally abated. Energy level is appropriately decreased. She is sleeping well. She denies excessive eye tearing. No hand-foot changes. She denies bleeding or  bruising symptoms. She notes that her blood sugars are "coming down".  A detailed review of systems is otherwise noncontributory as noted below.  Review of Systems: Constitutional:  Fatigue. Eyes: uses glasses ENT: no complaints Cardiovascular: no chest pain or dyspnea on exertion Respiratory: no cough, shortness of breath, or wheezing Neurological: no TIA or stroke symptoms Dermatological: negative Gastrointestinal: no abdominal pain, change in bowel habits, or black or bloody stools Genito-Urinary: no dysuria, trouble voiding, or hematuria Hematological and Lymphatic: negative Breast: negative Musculoskeletal: Evidence of left upper extremity edema, persistant. Remaining ROS negative.   Medications:   I have reviewed the patient's current medications.  Current Outpatient Prescriptions  Medication Sig Dispense Refill  . dexamethasone (DECADRON) 4 MG tablet Take 2 tablets two times a day the day before Taxotere. Then take 2 tabs two times a day starting the day after chemo for 3 days.  30 tablet  1  . gabapentin (NEURONTIN) 300 MG capsule Take 1 capsule (300 mg total) by mouth 3 (three) times daily.  90 capsule  3  . Insulin Human (INSULIN PUMP) 100 unit/ml SOLN Inject into the skin continuous.      Marland Kitchen lidocaine-prilocaine (EMLA) cream Apply topically as needed.      Marland Kitchen lisinopril (PRINIVIL,ZESTRIL) 10 MG tablet Take 10 mg by mouth daily. For blood pressure       . LORazepam (ATIVAN) 0.5 MG tablet Take 1 tablet (0.5 mg total) by mouth every 6 (six) hours as needed (Nausea or vomiting).  30 tablet  0  . ondansetron (ZOFRAN) 8 MG tablet Take 1 tablet two times a day starting the day after chemo for 3 days. Then take 1 tab two  times a day as needed for nausea or vomiting.  30 tablet  1  . oxyCODONE-acetaminophen (TYLOX) 5-500 MG per capsule Take 1 capsule by mouth as needed.  100 capsule  0  . prochlorperazine (COMPAZINE) 10 MG tablet Take 1 tablet (10 mg total) by mouth every 6 (six) hours  as needed (Nausea or vomiting).  30 tablet  1  . prochlorperazine (COMPAZINE) 25 MG suppository Place 1 suppository (25 mg total) rectally every 12 (twelve) hours as needed for nausea.  12 suppository  3  . triamterene-hydrochlorothiazide (DYAZIDE) 37.5-25 MG per capsule Take 1 each (1 capsule total) by mouth every morning.  30 capsule  0  . zolpidem (AMBIEN) 5 MG tablet Take 1 tablet (5 mg total) by mouth at bedtime as needed for sleep.  30 tablet  0  . DISCONTD: gabapentin (NEURONTIN) 300 MG capsule Take 300 mg by mouth 3 (three) times daily.        Allergies:  Allergies  Allergen Reactions  . Hydrocodone-Acetaminophen Itching and Swelling  . Insulin Glargine Itching, Nausea And Vomiting and Swelling  . Nph Iletin I (Insulin Isophane, Mixed Nph) Itching, Nausea And Vomiting and Swelling    Physical Exam: Filed Vitals:   06/05/11 1342  BP: 132/78  Pulse: 122  Temp: 98.4 F (36.9 C)  Weight: 183 lbs. HEENT:  Sclerae anicteric, conjunctivae pink.  Oropharynx clear.  Mild mucositis but no evidence of oral candidiasis.   Nodes:  No cervical, supraclavicular, or axillary lymphadenopathy palpated.  Breast Exam:  Deferred.   Lungs:  Clear to auscultation bilaterally.  No crackles, rhonchi, or wheezes.   Heart:  Regular rate and rhythm. ABD:  Soft, nontender with normal bowel sounds. Insulin pump in place. Extremities: Evidence of left upper extremity fullness consistent with lymphedema. No evidence of palpable venous cords, erythema or evidence of streaking. Good range of motion.   NEURO: Alert and oriented x3.  Lab Results: Lab Results  Component Value Date   WBC 12.5* 06/05/2011   HGB 9.7* 06/05/2011   HCT 29.4* 06/05/2011   MCV 88.6 06/05/2011   PLT 231 06/05/2011   NEUTROABS 7.8* 06/05/2011     Chemistry      Component Value Date/Time   NA 135 05/29/2011 0853   K 4.1 05/29/2011 0853   CL 98 05/29/2011 0853   CO2 22 05/29/2011 0853   BUN 17 05/29/2011 0853   CREATININE 0.74 05/29/2011  0853      Component Value Date/Time   CALCIUM 9.4 05/29/2011 0853   ALKPHOS 153* 05/29/2011 0853   AST 15 05/29/2011 0853   ALT 23 05/29/2011 0853   BILITOT 0.6 05/29/2011 0853         Assessment:  Malissa is a 41 year old [place] woman with a newly diagnosed pathologically staged T2, N2 a triple negative left breast carcinoma, s/p left modified radical mastectomy with axillary lymph node dissection on 02/21/2011. She is participating in [facility] B-49, and has been randomized to 6 cycles of every 3 week Taxotere/Cytoxan with Neulasta support on day 2, held with cycle 2.  Currently day 7 cycle 4 . 2. Insulin pump dependent diabetic.  3. History of severe afebrile neutropenia due to Neulasta being held on day 2, normalization of total white blood cell count and ANC, though restarted with cycle 3, normal ANC today.  4. Excessive eye tearing, secondary to Taxotere-based therapy, improving on lubricating eyedrops.  5. Nailbed sensitivity, secondary to Taxotere-based therapy, improvement with utilization of vinegar soaks.  6.  Left upper extremity lymphedema, slight increase as compared to last week .Active treatment in [facility]ema Clinic.  7. Fatigue.  Case to be reviewed with Dr. Pierce Crane.  ECOG: 1   Plan:  Martia will return in 2 weeks' time for a followup exam prior to day 1 cycle 5 of every three-week Taxotere/Cytoxan. Again Neulasta will be utilized on day 2. Refills for Tylox 5/500 and gabapentin 300 mg by mouth 3 times a day were provided. She knows to call with any changes or problems.    Beata Beason T, PA-C 06/05/2011

## 2011-06-07 ENCOUNTER — Ambulatory Visit: Payer: BC Managed Care – PPO | Admitting: Physical Therapy

## 2011-06-09 ENCOUNTER — Ambulatory Visit: Payer: BC Managed Care – PPO | Admitting: Physical Therapy

## 2011-06-12 ENCOUNTER — Ambulatory Visit: Payer: BC Managed Care – PPO | Admitting: Physical Therapy

## 2011-06-14 ENCOUNTER — Ambulatory Visit: Payer: BC Managed Care – PPO | Admitting: Physical Therapy

## 2011-06-14 ENCOUNTER — Other Ambulatory Visit: Payer: Self-pay | Admitting: Oncology

## 2011-06-14 DIAGNOSIS — G4700 Insomnia, unspecified: Secondary | ICD-10-CM

## 2011-06-16 ENCOUNTER — Other Ambulatory Visit: Payer: Self-pay | Admitting: *Deleted

## 2011-06-16 ENCOUNTER — Ambulatory Visit: Payer: BC Managed Care – PPO | Admitting: Physical Therapy

## 2011-06-16 ENCOUNTER — Telehealth: Payer: Self-pay | Admitting: *Deleted

## 2011-06-16 DIAGNOSIS — C50919 Malignant neoplasm of unspecified site of unspecified female breast: Secondary | ICD-10-CM

## 2011-06-16 NOTE — Telephone Encounter (Signed)
PT IS REQUESTING A RX FOR A WHEELCHAIR DUE TO EXTREME FATIGUE. PT IS SCHEDULED TO BE EVALUATED BY CS 06/19/11 BEFORE TX. PT STATES THAT SHE IS ABLE TO AMBULATE WITH DIFFICULTY IN THE HOUSE BUT CAN MANAGE. PT AGREED TO WAIT UNTIL TO CS FOR FURTHER EVALUATION

## 2011-06-19 ENCOUNTER — Other Ambulatory Visit (HOSPITAL_BASED_OUTPATIENT_CLINIC_OR_DEPARTMENT_OTHER): Payer: BC Managed Care – PPO | Admitting: Lab

## 2011-06-19 ENCOUNTER — Other Ambulatory Visit: Payer: Self-pay | Admitting: *Deleted

## 2011-06-19 ENCOUNTER — Ambulatory Visit: Payer: BC Managed Care – PPO

## 2011-06-19 ENCOUNTER — Ambulatory Visit (HOSPITAL_BASED_OUTPATIENT_CLINIC_OR_DEPARTMENT_OTHER): Payer: BC Managed Care – PPO | Admitting: Physician Assistant

## 2011-06-19 ENCOUNTER — Encounter: Payer: Self-pay | Admitting: *Deleted

## 2011-06-19 ENCOUNTER — Ambulatory Visit: Payer: BC Managed Care – PPO | Admitting: Physical Therapy

## 2011-06-19 ENCOUNTER — Encounter: Payer: Self-pay | Admitting: Physician Assistant

## 2011-06-19 VITALS — BP 132/79 | HR 112 | Temp 97.8°F | Ht 65.0 in | Wt 191.7 lb

## 2011-06-19 DIAGNOSIS — C50419 Malignant neoplasm of upper-outer quadrant of unspecified female breast: Secondary | ICD-10-CM | POA: Diagnosis not present

## 2011-06-19 DIAGNOSIS — C50912 Malignant neoplasm of unspecified site of left female breast: Secondary | ICD-10-CM

## 2011-06-19 DIAGNOSIS — C50919 Malignant neoplasm of unspecified site of unspecified female breast: Secondary | ICD-10-CM

## 2011-06-19 DIAGNOSIS — D702 Other drug-induced agranulocytosis: Secondary | ICD-10-CM

## 2011-06-19 DIAGNOSIS — R5381 Other malaise: Secondary | ICD-10-CM | POA: Diagnosis not present

## 2011-06-19 DIAGNOSIS — R5383 Other fatigue: Secondary | ICD-10-CM

## 2011-06-19 DIAGNOSIS — G609 Hereditary and idiopathic neuropathy, unspecified: Secondary | ICD-10-CM

## 2011-06-19 LAB — CBC WITH DIFFERENTIAL/PLATELET
Basophils Absolute: 0 10*3/uL (ref 0.0–0.1)
EOS%: 0.1 % (ref 0.0–7.0)
HCT: 30.4 % — ABNORMAL LOW (ref 34.8–46.6)
HGB: 9.9 g/dL — ABNORMAL LOW (ref 11.6–15.9)
LYMPH%: 14.4 % (ref 14.0–49.7)
MCH: 29 pg (ref 25.1–34.0)
MCV: 89.3 fL (ref 79.5–101.0)
MONO%: 6.3 % (ref 0.0–14.0)
NEUT%: 79 % — ABNORMAL HIGH (ref 38.4–76.8)
Platelets: 341 10*3/uL (ref 145–400)

## 2011-06-19 LAB — COMPREHENSIVE METABOLIC PANEL
Albumin: 3.6 g/dL (ref 3.5–5.2)
Alkaline Phosphatase: 129 U/L — ABNORMAL HIGH (ref 39–117)
BUN: 15 mg/dL (ref 6–23)
Creatinine, Ser: 0.63 mg/dL (ref 0.50–1.10)
Glucose, Bld: 344 mg/dL — ABNORMAL HIGH (ref 70–99)
Potassium: 3.9 mEq/L (ref 3.5–5.3)
Total Bilirubin: 0.4 mg/dL (ref 0.3–1.2)

## 2011-06-19 NOTE — Progress Notes (Signed)
Hematology and Oncology Follow Up Visit  [name] [id number] [birth year] 41 y.o. 06/19/2011    HPI: [name] is a 41 year old [place] woman with a newly diagnosed pathologically staged T2, N2 a triple negative left breast carcinoma, s/p left modified radical mastectomy with axillary lymph node dissection on 02/21/2011. She is participating in NSABP B-49, and has been randomized to 6 cycles of every 3 week Taxotere/Cytoxan with Neulasta support on day 2, held with cycle 2. Due for day 1 cycle 5. 2. Insulin pump dependent diabetic. 3. Severe afebrile neutropenia due to Neulasta being held on day 2, normalization of total white blood cell count and ANC, though to be restarted with cycle 3. 4. Excessive eye tearing, secondary to Taxotere-based therapy, improving on lubricating eyedrops. 5. Nailbed sensitivity, secondary to Taxotere-based therapy, improvement with utilization of vinegar soaks. 6. Left upper extremity lymphedema,  Interim History:   [name] is seen today for followup, prior to day 1 cycle 5 of 6 planned every 3 week doses of Taxotere/Cytoxan with reintroduction of Neulasta for granulocyte support on day 2 according to NSABP B-49 protocol. She is quite fatigued, in fact late last week, she contacted our office stating that she "needed a wheelchair" to get around due to profound fatigability. This has improved, but it is still noticeable intermittently. She denies any new numbness or tingling. She denies any unexplained fevers, chills, or night sweats. Her appetite has been excellent. She denies any nausea, emesis, diarrhea.  She has noted increased tendency towards constipation. She has noted a bit of increase in neuropathy symptoms, no PPE changes.  She does have excessive eye tearing and is utilizing lubricating eyedrops effectively. Her blood sugars have been under fair control, though she notes her insulin pump register her blood glucose of 350 this a.m. A  detailed review of systems is otherwise noncontributory as noted below.  Review of Systems: Constitutional:  Increasing fatigability. Eyes: uses glasses ENT: no complaints Cardiovascular: no chest pain or dyspnea on exertion Respiratory: no cough, shortness of breath, or wheezing Neurological: no TIA or stroke symptoms Dermatological: negative Gastrointestinal: no abdominal pain, change in bowel habits, or black or bloody stools Genito-Urinary: no dysuria, trouble voiding, or hematuria Hematological and Lymphatic: negative Breast: negative Musculoskeletal: Evidence of left upper extremity edema, improved as compared to last week, compression dressing in place.. Remaining ROS negative.   Medications:   I have reviewed the patient's current medications.  Current Outpatient Prescriptions  Medication Sig Dispense Refill  . dexamethasone (DECADRON) 4 MG tablet Take 2 tablets two times a day the day before Taxotere. Then take 2 tabs two times a day starting the day after chemo for 3 days.  30 tablet  1  . gabapentin (NEURONTIN) 300 MG capsule Take 1 capsule (300 mg total) by mouth 3 (three) times daily.  90 capsule  3  . Insulin Human (INSULIN PUMP) 100 unit/ml SOLN Inject into the skin continuous.      Marland Kitchen lidocaine-prilocaine (EMLA) cream Apply topically as needed.      Marland Kitchen lisinopril (PRINIVIL,ZESTRIL) 10 MG tablet Take 10 mg by mouth daily. For blood pressure       . LORazepam (ATIVAN) 0.5 MG tablet Take 1 tablet (0.5 mg total) by mouth every 6 (six) hours as needed (Nausea or vomiting).  30 tablet  0  . ondansetron (ZOFRAN) 8 MG tablet Take 1 tablet two times a day starting the day after chemo for 3 days. Then take 1 tab two times a day  as needed for nausea or vomiting.  30 tablet  1  . oxyCODONE-acetaminophen (TYLOX) 5-500 MG per capsule Take 1 capsule by mouth as needed.  100 capsule  0  . prochlorperazine (COMPAZINE) 10 MG tablet Take 1 tablet (10 mg total) by mouth every 6 (six) hours  as needed (Nausea or vomiting).  30 tablet  1  . prochlorperazine (COMPAZINE) 25 MG suppository Place 1 suppository (25 mg total) rectally every 12 (twelve) hours as needed for nausea.  12 suppository  3  . zolpidem (AMBIEN) 5 MG tablet TAKE ONE TABLET BY MOUTH AT BEDTIME  30 tablet  0  . triamterene-hydrochlorothiazide (DYAZIDE) 37.5-25 MG per capsule Take 1 each (1 capsule total) by mouth every morning.  30 capsule  0    Allergies:  Allergies  Allergen Reactions  . Hydrocodone-Acetaminophen Itching and Swelling  . Insulin Glargine Itching, Nausea And Vomiting and Swelling  . Nph Iletin I (Insulin Isophane, Mixed Nph) Itching, Nausea And Vomiting and Swelling    Physical Exam: Filed Vitals:   06/19/11 0925  BP: 132/79  Pulse: 112  Temp: 97.8 F (36.6 C)  Weight: 191 lbs. HEENT:  Sclerae anicteric, conjunctivae pink.  Oropharynx clear.  No mucositis or candidiasis.   Nodes:  No cervical, supraclavicular, or axillary lymphadenopathy palpated.  Breast Exam:  Deferred.   Lungs:  Clear to auscultation bilaterally.  No crackles, rhonchi, or wheezes.   Heart:  Regular rate and rhythm. ABD:  Soft, nontender with normal bowel sounds. Insulin pump in place. Extremities: Evidence of left upper extremity fullness consistent with lymphedema. No evidence of palpable venous cords, erythema or evidence of streaking. Good range of motion.   NEURO: Alert and oriented x3.  Lower extremity muscular strength is intact.  Lab Results: Lab Results  Component Value Date   WBC 12.3* 06/19/2011   HGB 9.9* 06/19/2011   HCT 30.4* 06/19/2011   MCV 89.3 06/19/2011   PLT 341 06/19/2011   NEUTROABS 9.7* 06/19/2011     Chemistry      Component Value Date/Time   NA 134* 06/19/2011 0913   K 3.9 06/19/2011 0913   CL 98 06/19/2011 0913   CO2 25 06/19/2011 0913   BUN 15 06/19/2011 0913   CREATININE 0.63 06/19/2011 0913      Component Value Date/Time   CALCIUM 9.4 06/19/2011 0913   ALKPHOS 129* 06/19/2011 0913    AST 16 06/19/2011 0913   ALT 23 06/19/2011 0913   BILITOT 0.4 06/19/2011 0913         Assessment:  [name] is a 41 year old [place] woman with a newly diagnosed pathologically staged T2, N2 a triple negative left breast carcinoma, s/p left modified radical mastectomy with axillary lymph node dissection on 02/21/2011. She is participating in NSABP B-49, and has been randomized to 6 cycles of every 3 week Taxotere/Cytoxan with Neulasta support on day 2, held with cycle 2. Due for day 1 cycle 5. 2. Insulin pump dependent diabetic.  3. Severe afebrile neutropenia due to Neulasta being held on day 2, normalization of total white blood cell count and ANC, though to be restarted with cycle 3.  4. Excessive eye tearing, secondary to Taxotere-based therapy, improving on lubricating eyedrops.  5. Nailbed sensitivity, secondary to Taxotere-based therapy, improvement with utilization of vinegar soaks.  6. Left upper extremity lymphedema, improved as compared to last week.  7. Grade 3 fatigability.  8. Increasing peripheral neuropathy not affecting ADLs.  Case to be reviewed with Dr. Pierce Crane.  ECOG: 2   Plan:  [name] will be delayed for 1 week due to grade 3 fatigability. She will be reassessed on 06/27/2011 prior to resuming day 1 cycle 5  of every 3 week Taxotere/Cytoxan with reintroduction of Neulasta for granulocyte support per NSABP B-39 protocol. She was encouraged to rest, monitor blood sugars closely, and to contact us if any difficulty should arise. This plan was reviewed with the patient, who voices understanding and agreement.  She knows to call with any changes or problems.    Shenita Trego T, PA-C 06/19/2011

## 2011-06-19 NOTE — Progress Notes (Signed)
06/19/11 at 11:29am- The pt was into the cancer center this am with her co-pastor.  The pt was scheduled to receive her cycle 5, day 1 treatment today.  The pt's labs were drawn and reviewed by Debbora Presto, Dr. Renelda Loma PA.  The pt's hemoglobin is a grade 2 anemia.  The pt was seen and examined by Debbora Presto, PA.  The pt complains of severe fatigue.  The pt called the office on 06/16/11 and requested a "wheelchair" due to her extreme fatigue.  The pt said that she had difficulty going to the bathroom and fixing her food when she was alone at home last week.  The pt said that she was "alittle" better today.  The pt was examined and her "lower extremity muscular strength was intact".  The PA felt that the persistent fatigue was a "clinically significant AE".  She made a decision to delay her cycle which she felt was in the pt's best interest. The pt also had some mild lower extremity edema (+1).  She reports persistent constipation, and she states she has to take medication daily for relief.  The pt will be evaluated next week prior to proceeding with cycle 5 treatment.  The research nurse notified NSABP CCD of the pt's delay in treatment.  According to Table 21, the pt's next cycle will need to be dose reduced.

## 2011-06-20 ENCOUNTER — Ambulatory Visit: Payer: BC Managed Care – PPO

## 2011-06-21 ENCOUNTER — Ambulatory Visit: Payer: BC Managed Care – PPO | Admitting: Physical Therapy

## 2011-06-22 ENCOUNTER — Other Ambulatory Visit: Payer: Self-pay | Admitting: *Deleted

## 2011-06-22 DIAGNOSIS — C50912 Malignant neoplasm of unspecified site of left female breast: Secondary | ICD-10-CM

## 2011-06-23 ENCOUNTER — Ambulatory Visit: Payer: BC Managed Care – PPO | Admitting: Physical Therapy

## 2011-06-27 ENCOUNTER — Other Ambulatory Visit: Payer: Self-pay | Admitting: Oncology

## 2011-06-27 ENCOUNTER — Other Ambulatory Visit (HOSPITAL_BASED_OUTPATIENT_CLINIC_OR_DEPARTMENT_OTHER): Payer: BC Managed Care – PPO | Admitting: Lab

## 2011-06-27 ENCOUNTER — Ambulatory Visit (HOSPITAL_BASED_OUTPATIENT_CLINIC_OR_DEPARTMENT_OTHER): Payer: BC Managed Care – PPO

## 2011-06-27 ENCOUNTER — Encounter: Payer: Self-pay | Admitting: *Deleted

## 2011-06-27 ENCOUNTER — Ambulatory Visit (HOSPITAL_BASED_OUTPATIENT_CLINIC_OR_DEPARTMENT_OTHER): Payer: BC Managed Care – PPO | Admitting: Oncology

## 2011-06-27 VITALS — BP 137/87 | HR 121 | Temp 97.9°F | Ht 65.0 in | Wt 189.0 lb

## 2011-06-27 DIAGNOSIS — C50419 Malignant neoplasm of upper-outer quadrant of unspecified female breast: Secondary | ICD-10-CM | POA: Diagnosis not present

## 2011-06-27 DIAGNOSIS — R5383 Other fatigue: Secondary | ICD-10-CM

## 2011-06-27 DIAGNOSIS — C50919 Malignant neoplasm of unspecified site of unspecified female breast: Secondary | ICD-10-CM

## 2011-06-27 DIAGNOSIS — Z171 Estrogen receptor negative status [ER-]: Secondary | ICD-10-CM

## 2011-06-27 DIAGNOSIS — C50912 Malignant neoplasm of unspecified site of left female breast: Secondary | ICD-10-CM

## 2011-06-27 DIAGNOSIS — Z5111 Encounter for antineoplastic chemotherapy: Secondary | ICD-10-CM

## 2011-06-27 DIAGNOSIS — I972 Postmastectomy lymphedema syndrome: Secondary | ICD-10-CM | POA: Diagnosis not present

## 2011-06-27 DIAGNOSIS — R5381 Other malaise: Secondary | ICD-10-CM | POA: Diagnosis not present

## 2011-06-27 LAB — CBC WITH DIFFERENTIAL/PLATELET
Eosinophils Absolute: 0 10*3/uL (ref 0.0–0.5)
LYMPH%: 12.8 % — ABNORMAL LOW (ref 14.0–49.7)
MONO#: 0.1 10*3/uL (ref 0.1–0.9)
NEUT#: 6.9 10*3/uL — ABNORMAL HIGH (ref 1.5–6.5)
Platelets: 283 10*3/uL (ref 145–400)
RBC: 3.5 10*6/uL — ABNORMAL LOW (ref 3.70–5.45)
WBC: 8.1 10*3/uL (ref 3.9–10.3)
nRBC: 0 % (ref 0–0)

## 2011-06-27 LAB — COMPREHENSIVE METABOLIC PANEL
ALT: 18 U/L (ref 0–35)
CO2: 25 mEq/L (ref 19–32)
Calcium: 9.9 mg/dL (ref 8.4–10.5)
Chloride: 101 mEq/L (ref 96–112)
Creatinine, Ser: 0.68 mg/dL (ref 0.50–1.10)
Sodium: 137 mEq/L (ref 135–145)
Total Protein: 6.9 g/dL (ref 6.0–8.3)

## 2011-06-27 MED ORDER — SODIUM CHLORIDE 0.9 % IV SOLN: Freq: Once | INTRAVENOUS | Status: DC

## 2011-06-27 MED ORDER — HEPARIN SOD (PORK) LOCK FLUSH 100 UNIT/ML IV SOLN
500.0000 [IU] | Freq: Once | INTRAVENOUS | Status: AC | PRN
  Administered 2011-06-27: 500 [IU]
  Filled 2011-06-27: qty 5

## 2011-06-27 MED ORDER — SODIUM CHLORIDE 0.9 % IV SOLN
500.0000 mg/m2 | Freq: Once | INTRAVENOUS | Status: AC
  Administered 2011-06-27: 980 mg via INTRAVENOUS
  Filled 2011-06-27: qty 49

## 2011-06-27 MED ORDER — ONDANSETRON 16 MG/50ML IVPB (CHCC)
16.0000 mg | Freq: Once | INTRAVENOUS | Status: AC
  Administered 2011-06-27: 16 mg via INTRAVENOUS

## 2011-06-27 MED ORDER — DEXAMETHASONE SODIUM PHOSPHATE 4 MG/ML IJ SOLN
8.0000 mg | Freq: Once | INTRAMUSCULAR | Status: AC
  Administered 2011-06-27: 8 mg via INTRAVENOUS

## 2011-06-27 MED ORDER — SODIUM CHLORIDE 0.9 % IJ SOLN
10.0000 mL | INTRAMUSCULAR | Status: DC | PRN
  Administered 2011-06-27: 10 mL
  Filled 2011-06-27: qty 10

## 2011-06-27 MED ORDER — DEXTROSE 5 % IV SOLN
60.0000 mg/m2 | Freq: Once | INTRAVENOUS | Status: AC
  Administered 2011-06-27: 120 mg via INTRAVENOUS
  Filled 2011-06-27: qty 12

## 2011-06-27 NOTE — Progress Notes (Signed)
Hematology and Oncology Follow Up Visit  [name] [id number] 11-Dec-1970 41 y.o. 06/27/2011    HPI: [name] is a 41 year old [place] woman with a newly diagnosed pathologically staged T2, N2 a triple negative left breast carcinoma, s/p left modified radical mastectomy with axillary lymph node dissection on 02/21/2011. She is participating in NSABP B-49, and has been randomized to 6 cycles of every 3 week Taxotere/Cytoxan with Neulasta support on day 2, held with cycle 2. Due for day 1 cycle 5. 2. Insulin pump dependent diabetic. 3. Severe afebrile neutropenia due to Neulasta being held on day 2, normalization of total white blood cell count and ANC, though to be restarted with cycle 3. 4. Excessive eye tearing, secondary to Taxotere-based therapy, improving on lubricating eyedrops. 5. Nailbed sensitivity, secondary to Taxotere-based therapy, improvement with utilization of vinegar soaks. 6. Left upper extremity lymphedema,  Interim History:   [name] is seen today for followup, prior to day 1 cycle 5 of 6 planned every 3 week doses of Taxotere/Cytoxan with reintroduction of Neulasta for granulocyte support on day 2 according to NSABP B-49 protocol. Chemo was held last week because of increased fatigue. She is feeling better today. Her blood sugars are predictably higher on the day that she gets chemo. She has noted a cyst like area on her left neck area. She has some LE edema. Review of Systems: Constitutional:  Increasing fatigability. Eyes: uses glasses ENT: no complaints Cardiovascular: no chest pain or dyspnea on exertion Respiratory: no cough, shortness of breath, or wheezing Neurological: no TIA or stroke symptoms Dermatological: negative Gastrointestinal: no abdominal pain, change in bowel habits, or black or bloody stools Genito-Urinary: no dysuria, trouble voiding, or hematuria Hematological and Lymphatic: negative Breast: negative Musculoskeletal:  Evidence of left upper extremity edema, improved as compared to last week, compression dressing in place.. Remaining ROS negative.   Medications:   I have reviewed the patient's current medications.  Current Outpatient Prescriptions  Medication Sig Dispense Refill  . dexamethasone (DECADRON) 4 MG tablet Take 2 tablets two times a day the day before Taxotere. Then take 2 tabs two times a day starting the day after chemo for 3 days.  30 tablet  1  . gabapentin (NEURONTIN) 300 MG capsule Take 1 capsule (300 mg total) by mouth 3 (three) times daily.  90 capsule  3  . Insulin Human (INSULIN PUMP) 100 unit/ml SOLN Inject into the skin continuous.      Marland Kitchen lidocaine-prilocaine (EMLA) cream Apply topically as needed.      Marland Kitchen lisinopril (PRINIVIL,ZESTRIL) 10 MG tablet Take 10 mg by mouth daily. For blood pressure       . LORazepam (ATIVAN) 0.5 MG tablet Take 1 tablet (0.5 mg total) by mouth every 6 (six) hours as needed (Nausea or vomiting).  30 tablet  0  . ondansetron (ZOFRAN) 8 MG tablet Take 1 tablet two times a day starting the day after chemo for 3 days. Then take 1 tab two times a day as needed for nausea or vomiting.  30 tablet  1  . oxyCODONE-acetaminophen (TYLOX) 5-500 MG per capsule Take 1 capsule by mouth as needed.  100 capsule  0  . prochlorperazine (COMPAZINE) 10 MG tablet Take 1 tablet (10 mg total) by mouth every 6 (six) hours as needed (Nausea or vomiting).  30 tablet  1  . prochlorperazine (COMPAZINE) 25 MG suppository Place 1 suppository (25 mg total) rectally every 12 (twelve) hours as needed for nausea.  12 suppository  3  . triamterene-hydrochlorothiazide (DYAZIDE) 37.5-25 MG per capsule Take 1 each (1 capsule total) by mouth every morning.  30 capsule  0  . zolpidem (AMBIEN) 5 MG tablet TAKE ONE TABLET BY MOUTH AT BEDTIME  30 tablet  0    Allergies:  Allergies  Allergen Reactions  . Hydrocodone-Acetaminophen Itching and Swelling  . Insulin Glargine Itching, Nausea And Vomiting  and Swelling  . Nph Iletin I (Insulin Isophane, Mixed Nph) Itching, Nausea And Vomiting and Swelling    Physical Exam: Filed Vitals:   06/27/11 1454  BP: 137/87  Pulse: 121  Temp: 97.9 F (36.6 C)  Weight: 191 lbs. HEENT:  Sclerae anicteric, conjunctivae pink.  Oropharynx clear.  No mucositis or candidiasis.   Nodes:  No cervical, supraclavicular, or axillary lymphadenopathy palpated, lt neck has a sebaceous cyst with minimal drainage. Breast Exam:  Deferred.   Lungs:  Clear to auscultation bilaterally.  No crackles, rhonchi, or wheezes.   Heart:  Regular rate and rhythm. ABD:  Soft, nontender with normal bowel sounds. Insulin pump in place. Extremities: Evidence of left upper extremity fullness consistent with lymphedema. No evidence of palpable venous cords, erythema or evidence of streaking. Good range of motion.  Lt arm wrapped today pending sleeve placement. NEURO: Alert and oriented x3.  Lower extremity muscular strength is intact.  Lab Results: Lab Results  Component Value Date   WBC 8.1 06/27/2011   HGB 10.4* 06/27/2011   HCT 31.1* 06/27/2011   MCV 88.9 06/27/2011   PLT 283 06/27/2011   NEUTROABS 6.9* 06/27/2011     Chemistry      Component Value Date/Time   NA 137 06/27/2011 1412   K 4.2 06/27/2011 1412   CL 101 06/27/2011 1412   CO2 25 06/27/2011 1412   BUN 16 06/27/2011 1412   CREATININE 0.68 06/27/2011 1412      Component Value Date/Time   CALCIUM 9.9 06/27/2011 1412   ALKPHOS 145* 06/27/2011 1412   AST 14 06/27/2011 1412   ALT 18 06/27/2011 1412   BILITOT 0.5 06/27/2011 1412         Assessment:  [name] is a 41 year old [place] woman with a newly diagnosed pathologically staged T2, N2 a triple negative left breast carcinoma, s/p left modified radical mastectomy with axillary lymph node dissection on 02/21/2011. She is participating in NSABP B-49, and has been randomized to 6 cycles of every 3 week Taxotere/Cytoxan with Neulasta support on day 2, held  with cycle 2. Due for day 1 cycle 5. 2. Insulin pump dependent diabetic.  3. Severe afebrile neutropenia due to Neulasta being held on day 2, normalization of total white blood cell count and ANC, though to be restarted with cycle 3.  4. Excessive eye tearing, secondary to Taxotere-based therapy, improving on lubricating eyedrops.  5. Nailbed sensitivity, secondary to Taxotere-based therapy, improvement with utilization of vinegar soaks.  6. Left upper extremity lymphedema, improved as compared to last week.  7. Grade 3 fatigability.  8. Increasing peripheral neuropathy not affecting ADLs. Marland Kitchen  ECOG: 1   Plan:  She is doing better; chemo will proceed with 20% dose reduction today and in 3 weeks.I have recommended warm soaks for the area on her lt neck. F/u as scheduled.   Pierce Crane, MD 06/27/2011

## 2011-06-27 NOTE — Progress Notes (Signed)
06/27/11 at 4:02pm- NSABP B-49 cycle 5, day 1 study notes.  The pt was into the clinic today with her co-pastor to begin her cycle 5.   The pt states that she feels much better this week.  She states she definitely has more energy this week.  She is able to do all of her activities, but she states she just has to rest often.  Fatigue-grade 1.  ECOG = 1.  She reports some separation of her nails.  Nail loss-grade 1.  She also has some bilateral edema to her feet.  Grade 1 edema- limbs.  Dr. Donnie Coffin encouraged the pt to wear compression hose for the edema.  The pt's labs were drawn and reviewed by Dr. Donnie Coffin before her treatment.  Dr. Donnie Coffin was in agreement with the protocol to reduce her doses for cycle 5 and cycle 6.  The pharmacy was notified of the dose reduction.  The pt is aware of her neulasta injection scheduled for tomorrow.  The pt was seen and examined today by Dr. Donnie Coffin.  The pt's weight is stable.  The sign was given to [facility], chemo nurse, regarding the pt's chemo administration.

## 2011-06-28 ENCOUNTER — Ambulatory Visit: Payer: BC Managed Care – PPO | Admitting: Physical Therapy

## 2011-06-28 ENCOUNTER — Ambulatory Visit (HOSPITAL_BASED_OUTPATIENT_CLINIC_OR_DEPARTMENT_OTHER): Payer: BC Managed Care – PPO

## 2011-06-28 VITALS — BP 148/92 | HR 107 | Temp 97.6°F

## 2011-06-28 DIAGNOSIS — C50919 Malignant neoplasm of unspecified site of unspecified female breast: Secondary | ICD-10-CM | POA: Diagnosis not present

## 2011-06-28 DIAGNOSIS — Z5189 Encounter for other specified aftercare: Secondary | ICD-10-CM

## 2011-06-28 DIAGNOSIS — C50912 Malignant neoplasm of unspecified site of left female breast: Secondary | ICD-10-CM

## 2011-06-28 MED ORDER — PEGFILGRASTIM INJECTION 6 MG/0.6ML
6.0000 mg | Freq: Once | SUBCUTANEOUS | Status: AC
  Administered 2011-06-28: 6 mg via SUBCUTANEOUS
  Filled 2011-06-28: qty 0.6

## 2011-06-30 ENCOUNTER — Ambulatory Visit: Payer: BC Managed Care – PPO | Admitting: Physical Therapy

## 2011-06-30 ENCOUNTER — Encounter: Payer: Self-pay | Admitting: *Deleted

## 2011-07-03 ENCOUNTER — Ambulatory Visit: Payer: BC Managed Care – PPO | Attending: Physician Assistant | Admitting: Physical Therapy

## 2011-07-03 DIAGNOSIS — IMO0001 Reserved for inherently not codable concepts without codable children: Secondary | ICD-10-CM | POA: Insufficient documentation

## 2011-07-03 DIAGNOSIS — I890 Lymphedema, not elsewhere classified: Secondary | ICD-10-CM | POA: Insufficient documentation

## 2011-07-05 ENCOUNTER — Ambulatory Visit: Payer: BC Managed Care – PPO

## 2011-07-08 ENCOUNTER — Other Ambulatory Visit: Payer: Self-pay | Admitting: Physician Assistant

## 2011-07-10 ENCOUNTER — Ambulatory Visit (HOSPITAL_BASED_OUTPATIENT_CLINIC_OR_DEPARTMENT_OTHER): Payer: BC Managed Care – PPO | Admitting: Physician Assistant

## 2011-07-10 ENCOUNTER — Other Ambulatory Visit (HOSPITAL_BASED_OUTPATIENT_CLINIC_OR_DEPARTMENT_OTHER): Payer: BC Managed Care – PPO | Admitting: Lab

## 2011-07-10 ENCOUNTER — Ambulatory Visit: Payer: BC Managed Care – PPO

## 2011-07-10 ENCOUNTER — Telehealth: Payer: Self-pay | Admitting: Oncology

## 2011-07-10 ENCOUNTER — Encounter: Payer: Self-pay | Admitting: Physician Assistant

## 2011-07-10 ENCOUNTER — Encounter: Payer: BC Managed Care – PPO | Admitting: Physical Therapy

## 2011-07-10 VITALS — BP 127/79 | HR 121 | Temp 98.0°F | Ht 65.0 in | Wt 186.6 lb

## 2011-07-10 DIAGNOSIS — C50419 Malignant neoplasm of upper-outer quadrant of unspecified female breast: Secondary | ICD-10-CM | POA: Diagnosis not present

## 2011-07-10 DIAGNOSIS — C50912 Malignant neoplasm of unspecified site of left female breast: Secondary | ICD-10-CM

## 2011-07-10 DIAGNOSIS — C50919 Malignant neoplasm of unspecified site of unspecified female breast: Secondary | ICD-10-CM

## 2011-07-10 DIAGNOSIS — I890 Lymphedema, not elsewhere classified: Secondary | ICD-10-CM | POA: Diagnosis not present

## 2011-07-10 DIAGNOSIS — E119 Type 2 diabetes mellitus without complications: Secondary | ICD-10-CM | POA: Diagnosis not present

## 2011-07-10 LAB — CBC WITH DIFFERENTIAL/PLATELET
Basophils Absolute: 0.1 10*3/uL (ref 0.0–0.1)
EOS%: 0.1 % (ref 0.0–7.0)
HGB: 10.1 g/dL — ABNORMAL LOW (ref 11.6–15.9)
LYMPH%: 16.2 % (ref 14.0–49.7)
MCH: 30 pg (ref 25.1–34.0)
MCV: 91.1 fL (ref 79.5–101.0)
MONO%: 7.1 % (ref 0.0–14.0)
Platelets: 161 10*3/uL (ref 145–400)
RDW: 16.1 % — ABNORMAL HIGH (ref 11.2–14.5)

## 2011-07-10 LAB — COMPREHENSIVE METABOLIC PANEL
ALT: 13 U/L (ref 0–35)
BUN: 19 mg/dL (ref 6–23)
Calcium: 9.3 mg/dL (ref 8.4–10.5)
Creatinine, Ser: 0.75 mg/dL (ref 0.50–1.10)
Glucose, Bld: 463 mg/dL — ABNORMAL HIGH (ref 70–99)
Potassium: 3.9 mEq/L (ref 3.5–5.3)
Total Bilirubin: 0.3 mg/dL (ref 0.3–1.2)
Total Protein: 6.4 g/dL (ref 6.0–8.3)

## 2011-07-10 NOTE — Progress Notes (Signed)
Hematology and Oncology Follow Up Visit  [name] [id number] Feb 10, 1970 41 y.o. 07/10/2011    HPI: Francene is a 41 year old [place] woman with a newly diagnosed pathologically staged T2, N2 a triple negative left breast carcinoma, s/p left modified radical mastectomy with axillary lymph node dissection on 02/21/2011. She is participating in NSABP B-49, and has been randomized to 6 cycles of every 3 week Taxotere/Cytoxan with Neulasta support on day 2, held with cycle 2.  Currently day 13 cycle 5/6.  2. Insulin pump dependent diabetic.  3. Severe afebrile neutropenia due to Neulasta being held on day 2, normalization of total white blood cell count and ANC, though to be restarted with cycle 3.  4. Excessive eye tearing, secondary to Taxotere-based therapy, improving on lubricating eyedrops.  5. Grade 1 nailbed sensitivity, secondary to Taxotere-based therapy, improvement with utilization of vinegar soaks.  6. Left upper extremity lymphedema, improved as compared to last week.  7. Grade 3 fatigability.  8. Increasing peripheral neuropathy not affecting ADL  Interim History:   [name] is seen today for followup, currently day 13 cycle 5/6 planned every 3 week doses of Taxotere/Cytoxan with reintroduction of Neulasta for granulocyte support on day 2 according to NSABP B-49 protocol.  She denies any new numbness or tingling. She denies any unexplained fevers, chills, or night sweats. Her appetite has been excellent. She denies any nausea, emesis, diarrhea.  She has noted increased tendency towards constipation. She has noted a bit of increase in neuropathy symptoms, no PPE changes.  She does have excessive eye tearing and is utilizing lubricating eyedrops effectively. Her blood sugars have been under fair control, though she notes her insulin pump register her blood glucose of 350 this a.m. A detailed review of systems is otherwise noncontributory as noted  below.  Review of Systems: Constitutional:  Increasing fatigability. Eyes: uses glasses ENT: no complaints Cardiovascular: no chest pain or dyspnea on exertion Respiratory: no cough, shortness of breath, or wheezing Neurological: no TIA or stroke symptoms Dermatological: negative Gastrointestinal: no abdominal pain, change in bowel habits, or black or bloody stools Genito-Urinary: no dysuria, trouble voiding, or hematuria Hematological and Lymphatic: negative Breast: negative Musculoskeletal: Evidence of left upper extremity edema, improved as compared to last week, compression dressing in place.. Remaining ROS negative.   Medications:   I have reviewed the patient's current medications.  Current Outpatient Prescriptions  Medication Sig Dispense Refill  . dexamethasone (DECADRON) 4 MG tablet Take 2 tablets two times a day the day before Taxotere. Then take 2 tabs two times a day starting the day after chemo for 3 days.  30 tablet  1  . gabapentin (NEURONTIN) 300 MG capsule Take 1 capsule (300 mg total) by mouth 3 (three) times daily.  90 capsule  3  . Insulin Human (INSULIN PUMP) 100 unit/ml SOLN Inject into the skin continuous.      Marland Kitchen lidocaine-prilocaine (EMLA) cream Apply topically as needed.      Marland Kitchen lisinopril (PRINIVIL,ZESTRIL) 10 MG tablet Take 10 mg by mouth daily. For blood pressure       . LORazepam (ATIVAN) 0.5 MG tablet Take 1 tablet (0.5 mg total) by mouth every 6 (six) hours as needed (Nausea or vomiting).  30 tablet  0  . ondansetron (ZOFRAN) 8 MG tablet Take 1 tablet two times a day starting the day after chemo for 3 days. Then take 1 tab two times a day as needed for nausea or vomiting.  30 tablet  1  .  oxyCODONE-acetaminophen (TYLOX) 5-500 MG per capsule Take 1 capsule by mouth as needed.  100 capsule  0  . prochlorperazine (COMPAZINE) 10 MG tablet Take 1 tablet (10 mg total) by mouth every 6 (six) hours as needed (Nausea or vomiting).  30 tablet  1  . prochlorperazine  (COMPAZINE) 25 MG suppository Place 1 suppository (25 mg total) rectally every 12 (twelve) hours as needed for nausea.  12 suppository  3  . triamterene-hydrochlorothiazide (DYAZIDE) 37.5-25 MG per capsule Take 1 each (1 capsule total) by mouth every morning.  30 capsule  0  . zolpidem (AMBIEN) 5 MG tablet TAKE ONE TABLET BY MOUTH AT BEDTIME  30 tablet  0    Allergies:  Allergies  Allergen Reactions  . Hydrocodone-Acetaminophen Itching and Swelling  . Insulin Glargine Itching, Nausea And Vomiting and Swelling  . Nph Iletin I (Insulin Isophane, Mixed Nph) Itching, Nausea And Vomiting and Swelling    Physical Exam: Filed Vitals:   07/10/11 0929  BP: 127/79  Pulse: 121  Temp: 98 F (36.7 C)  Weight: 186 lbs. HEENT:  Sclerae anicteric, conjunctivae pink.  Oropharynx clear.  No mucositis or candidiasis.   Nodes:  No cervical, supraclavicular, or axillary lymphadenopathy palpated.  Breast Exam:  Deferred.   Lungs:  Clear to auscultation bilaterally.  No crackles, rhonchi, or wheezes.   Heart:  Regular rate and rhythm. ABD:  Soft, nontender with normal bowel sounds. Insulin pump in place. Extremities:  Left upper extremity fullness consistent with lymphedema, has improved significantly, compression sleeve in place.  No evidence of palpable venous cords or evidence of streaking, but evidence of bilateral lower extremity 1+ edema.  Good range of motion.   NEURO: Alert and oriented x3.  Lower extremity muscular strength is intact.  Lab Results: Lab Results  Component Value Date   WBC 9.1 07/10/2011   HGB 10.1* 07/10/2011   HCT 30.6* 07/10/2011   MCV 91.1 07/10/2011   PLT 161 07/10/2011   NEUTROABS 6.8* 07/10/2011     Chemistry      Component Value Date/Time   NA 133* 07/10/2011 0811   K 3.9 07/10/2011 0811   CL 96 07/10/2011 0811   CO2 25 07/10/2011 0811   BUN 19 07/10/2011 0811   CREATININE 0.75 07/10/2011 0811      Component Value Date/Time   CALCIUM 9.3 07/10/2011 0811   ALKPHOS 162*  07/10/2011 0811   AST 14 07/10/2011 0811   ALT 13 07/10/2011 0811   BILITOT 0.3 07/10/2011 0811         Assessment:  [name] is a 41 year old [place] woman with a newly diagnosed pathologically staged T2, N2 a triple negative left breast carcinoma, s/p left modified radical mastectomy with axillary lymph node dissection on 02/21/2011. She is participating in NSABP B-49, and has been randomized to 6 cycles of every 3 week Taxotere/Cytoxan with Neulasta support on day 2, held with cycle 2.  Currently day 13 cycle 5/6.  2. Insulin pump dependent diabetic.  3. Severe afebrile neutropenia due to Neulasta being held on day 2, normalization of total white blood cell count and ANC, though to be restarted with cycle 3.  4. Excessive eye tearing, secondary to Taxotere-based therapy, improving on lubricating eyedrops.  5. Grade 1 nailbed sensitivity, secondary to Taxotere-based therapy, improvement with utilization of vinegar soaks.  6. Left upper extremity lymphedema, improved as compared to last week.  7. Grade 3 fatigability.  8. Increasing peripheral neuropathy not affecting ADLs.  Case reviewed with  Dr. Pierce Crane.  ECOG: 1  Plan:  Jannel will return on 07/17/11 for reassessment prior to day 1 cycle 6 of every 3 week Taxotere/Cytoxan with reintroduction of Neulasta for granulocyte support per NSABP B-39 protocol. She was encouraged to rest, monitor blood sugars closely, and to contact us if any difficulty should arise.  She was also given a prescription for bilateral TED hose.  I will refer her to Dr. Luciano Cutter for radiation oncology consultation.This plan was reviewed with the patient, who voices understanding and agreement.  She knows to call with any changes or problems.    Dariel Pellecchia T, PA-C 07/10/2011

## 2011-07-10 NOTE — Telephone Encounter (Signed)
S/w the pt and she is aware of her appt with dr Michell Heinrich for 08/02/2011

## 2011-07-10 NOTE — Telephone Encounter (Signed)
gve the pt her June,july 2013 appt calendar. Pt is aware we will call her with the rad onc appt

## 2011-07-11 ENCOUNTER — Ambulatory Visit: Payer: BC Managed Care – PPO

## 2011-07-13 ENCOUNTER — Encounter: Payer: BC Managed Care – PPO | Admitting: Physical Therapy

## 2011-07-17 ENCOUNTER — Encounter: Payer: Self-pay | Admitting: *Deleted

## 2011-07-17 ENCOUNTER — Other Ambulatory Visit (HOSPITAL_BASED_OUTPATIENT_CLINIC_OR_DEPARTMENT_OTHER): Payer: BC Managed Care – PPO | Admitting: Lab

## 2011-07-17 ENCOUNTER — Ambulatory Visit: Payer: BC Managed Care – PPO | Admitting: Physician Assistant

## 2011-07-17 ENCOUNTER — Ambulatory Visit (HOSPITAL_BASED_OUTPATIENT_CLINIC_OR_DEPARTMENT_OTHER): Payer: BC Managed Care – PPO

## 2011-07-17 ENCOUNTER — Ambulatory Visit (HOSPITAL_BASED_OUTPATIENT_CLINIC_OR_DEPARTMENT_OTHER): Payer: BC Managed Care – PPO | Admitting: Physician Assistant

## 2011-07-17 ENCOUNTER — Ambulatory Visit (HOSPITAL_COMMUNITY)
Admission: RE | Admit: 2011-07-17 | Discharge: 2011-07-17 | Disposition: A | Discharge status: Home / Self Care | Payer: BC Managed Care – PPO | Source: Ambulatory Visit | Attending: Physician Assistant | Admitting: Physician Assistant

## 2011-07-17 ENCOUNTER — Other Ambulatory Visit: Payer: BC Managed Care – PPO | Admitting: Lab

## 2011-07-17 ENCOUNTER — Telehealth: Payer: Self-pay | Admitting: Oncology

## 2011-07-17 VITALS — BP 136/82 | HR 128 | Temp 97.4°F | Ht 65.0 in | Wt 190.5 lb

## 2011-07-17 DIAGNOSIS — R05 Cough: Secondary | ICD-10-CM

## 2011-07-17 DIAGNOSIS — E119 Type 2 diabetes mellitus without complications: Secondary | ICD-10-CM | POA: Diagnosis not present

## 2011-07-17 DIAGNOSIS — C50419 Malignant neoplasm of upper-outer quadrant of unspecified female breast: Secondary | ICD-10-CM | POA: Diagnosis not present

## 2011-07-17 DIAGNOSIS — R059 Cough, unspecified: Secondary | ICD-10-CM | POA: Insufficient documentation

## 2011-07-17 DIAGNOSIS — C50912 Malignant neoplasm of unspecified site of left female breast: Secondary | ICD-10-CM

## 2011-07-17 DIAGNOSIS — D709 Neutropenia, unspecified: Secondary | ICD-10-CM | POA: Diagnosis not present

## 2011-07-17 DIAGNOSIS — C50919 Malignant neoplasm of unspecified site of unspecified female breast: Secondary | ICD-10-CM | POA: Insufficient documentation

## 2011-07-17 DIAGNOSIS — Z5111 Encounter for antineoplastic chemotherapy: Secondary | ICD-10-CM

## 2011-07-17 LAB — COMPREHENSIVE METABOLIC PANEL
ALT: 9 U/L (ref 0–35)
CO2: 25 mEq/L (ref 19–32)
Calcium: 9.7 mg/dL (ref 8.4–10.5)
Chloride: 99 mEq/L (ref 96–112)
Potassium: 4.2 mEq/L (ref 3.5–5.3)
Sodium: 134 mEq/L — ABNORMAL LOW (ref 135–145)
Total Bilirubin: 0.4 mg/dL (ref 0.3–1.2)
Total Protein: 6.9 g/dL (ref 6.0–8.3)

## 2011-07-17 LAB — CBC WITH DIFFERENTIAL/PLATELET
BASO%: 0.2 % (ref 0.0–2.0)
MCHC: 33 g/dL (ref 31.5–36.0)
MONO#: 0.5 10*3/uL (ref 0.1–0.9)
RBC: 3.56 10*6/uL — ABNORMAL LOW (ref 3.70–5.45)
RDW: 15.9 % — ABNORMAL HIGH (ref 11.2–14.5)
WBC: 11 10*3/uL — ABNORMAL HIGH (ref 3.9–10.3)
lymph#: 1.1 10*3/uL (ref 0.9–3.3)

## 2011-07-17 MED ORDER — HEPARIN SOD (PORK) LOCK FLUSH 100 UNIT/ML IV SOLN
500.0000 [IU] | Freq: Once | INTRAVENOUS | Status: AC | PRN
  Administered 2011-07-17: 500 [IU]
  Filled 2011-07-17: qty 5

## 2011-07-17 MED ORDER — DEXAMETHASONE SODIUM PHOSPHATE 4 MG/ML IJ SOLN
8.0000 mg | Freq: Once | INTRAMUSCULAR | Status: AC
  Administered 2011-07-17: 8 mg via INTRAVENOUS

## 2011-07-17 MED ORDER — ONDANSETRON 16 MG/50ML IVPB (CHCC)
16.0000 mg | Freq: Once | INTRAVENOUS | Status: AC
  Administered 2011-07-17: 16 mg via INTRAVENOUS

## 2011-07-17 MED ORDER — DOCETAXEL CHEMO INJECTION 160 MG/16ML
60.0000 mg/m2 | Freq: Once | INTRAVENOUS | Status: AC
  Administered 2011-07-17: 120 mg via INTRAVENOUS
  Filled 2011-07-17: qty 12

## 2011-07-17 MED ORDER — SODIUM CHLORIDE 0.9 % IJ SOLN
10.0000 mL | INTRAMUSCULAR | Status: DC | PRN
  Administered 2011-07-17: 10 mL
  Filled 2011-07-17: qty 10

## 2011-07-17 MED ORDER — SODIUM CHLORIDE 0.9 % IV SOLN
Freq: Once | INTRAVENOUS | Status: AC
  Administered 2011-07-17: 13:00:00 via INTRAVENOUS

## 2011-07-17 MED ORDER — CYCLOPHOSPHAMIDE CHEMO INJECTION 1 GM
500.0000 mg/m2 | Freq: Once | INTRAMUSCULAR | Status: AC
  Administered 2011-07-17: 980 mg via INTRAVENOUS
  Filled 2011-07-17: qty 49

## 2011-07-17 NOTE — Progress Notes (Signed)
Hematology and Oncology Follow Up Visit  [name] [id number] February 12, 1970 41 y.o. 07/17/2011    HPI: [name] is a 41 year old [place] woman with a newly diagnosed pathologically staged T2, N2 a triple negative left breast carcinoma, s/p left modified radical mastectomy with axillary lymph node dissection on 02/21/2011. She is participating in NSABP B-49, and has been randomized to 6 cycles of every 3 week Taxotere/Cytoxan with Neulasta support on day 2, held with cycle 2.  Currently day 1 cycle 6/6.  2. Insulin pump dependent diabetic.  3. Severe afebrile neutropenia due to Neulasta being held on day 2, normalization of total white blood cell count and ANC, though to be restarted with cycle 3.  4. Excessive eye tearing, secondary to Taxotere-based therapy, improving on lubricating eyedrops.  5. Grade 1 nailbed sensitivity, secondary to Taxotere-based therapy, improvement with utilization of vinegar soaks.  6. Left upper extremity lymphedema, improved as compared to last week.  7. Grade 3 fatigability.  8. Increasing peripheral neuropathy not affecting ADL  Interim History:   Merelyn is seen today for followup, currently day 1 cycle 6/6 planned every 3 week doses of Taxotere/Cytoxan with reintroduction of Neulasta for granulocyte support on day 2 according to NSABP B-49 protocol.  She denies any new numbness or tingling. She denies any unexplained fevers, chills, or night sweats. Her appetite has been excellent. She denies any nausea, emesis, diarrhea.   She has noted persistant increase in neuropathy symptoms, no PPE changes.  She does have excessive eye tearing and is utilizing lubricating eyedrops effectively. Her blood sugars have been under fair control, though she notes her insulin pump register her blood glucose of 350 this a.m. She has also noted a dry essentially nonproductive cough for the past week. It is also noted that her weight is up about 4 pounds. A  detailed review of systems is otherwise noncontributory as noted below.  Review of Systems: Constitutional:  Increasing fatigability. Eyes: uses glasses ENT: no complaints Cardiovascular: no chest pain or dyspnea on exertion Respiratory: nonproductive cough for 1 week. Neurological: no TIA or stroke symptoms Dermatological: negative Gastrointestinal: no abdominal pain, change in bowel habits, or black or bloody stools Genito-Urinary: no dysuria, trouble voiding, or hematuria Hematological and Lymphatic: negative Breast: negative Musculoskeletal: Evidence of left upper extremity edema, improved as compared to last week, compression dressing in place.. Remaining ROS negative.   Medications:   I have reviewed the patient's current medications.  Current Outpatient Prescriptions  Medication Sig Dispense Refill  . dexamethasone (DECADRON) 4 MG tablet Take 2 tablets two times a day the day before Taxotere. Then take 2 tabs two times a day starting the day after chemo for 3 days.  30 tablet  1  . gabapentin (NEURONTIN) 300 MG capsule Take 1 capsule (300 mg total) by mouth 3 (three) times daily.  90 capsule  3  . Insulin Human (INSULIN PUMP) 100 unit/ml SOLN Inject into the skin continuous.      Marland Kitchen lidocaine-prilocaine (EMLA) cream Apply topically as needed.      Marland Kitchen lisinopril (PRINIVIL,ZESTRIL) 10 MG tablet Take 10 mg by mouth daily. For blood pressure       . LORazepam (ATIVAN) 0.5 MG tablet Take 1 tablet (0.5 mg total) by mouth every 6 (six) hours as needed (Nausea or vomiting).  30 tablet  0  . ondansetron (ZOFRAN) 8 MG tablet Take 1 tablet two times a day starting the day after chemo for 3 days. Then take 1 tab  two times a day as needed for nausea or vomiting.  30 tablet  1  . oxyCODONE-acetaminophen (TYLOX) 5-500 MG per capsule Take 1 capsule by mouth as needed.  100 capsule  0  . prochlorperazine (COMPAZINE) 10 MG tablet Take 1 tablet (10 mg total) by mouth every 6 (six) hours as needed  (Nausea or vomiting).  30 tablet  1  . prochlorperazine (COMPAZINE) 25 MG suppository Place 1 suppository (25 mg total) rectally every 12 (twelve) hours as needed for nausea.  12 suppository  3  . triamterene-hydrochlorothiazide (DYAZIDE) 37.5-25 MG per capsule Take 1 each (1 capsule total) by mouth every morning.  30 capsule  0  . zolpidem (AMBIEN) 5 MG tablet TAKE ONE TABLET BY MOUTH AT BEDTIME  30 tablet  0   No current facility-administered medications for this visit.   Facility-Administered Medications Ordered in Other Visits  Medication Dose Route Frequency Provider Last Rate Last Dose  . 0.9 %  sodium chloride infusion   Intravenous Once Pierce Crane, MD 20 mL/hr at 07/17/11 1230    . cyclophosphamide (CYTOXAN) 980 mg in sodium chloride 0.9 % 250 mL chemo infusion  500 mg/m2 (Treatment Plan Actual) Intravenous Once Pierce Crane, MD      . dexamethasone (DECADRON) injection 8 mg  8 mg Intravenous Once Pierce Crane, MD   8 mg at 07/17/11 1237  . DOCEtaxel (TAXOTERE) 120 mg in dextrose 5 % 250 mL chemo infusion  60 mg/m2 (Treatment Plan Actual) Intravenous Once Pierce Crane, MD 262 mL/hr at 07/17/11 1312 120 mg at 07/17/11 1312  . heparin lock flush 100 unit/mL  500 Units Intracatheter Once PRN Pierce Crane, MD      . ondansetron [facility]) IVPB 16 mg  16 mg Intravenous Once Pierce Crane, MD   16 mg at 07/17/11 1237  . sodium chloride 0.9 % injection 10 mL  10 mL Intracatheter PRN Pierce Crane, MD        Allergies:  Allergies  Allergen Reactions  . Hydrocodone-Acetaminophen Itching and Swelling  . Insulin Glargine Itching, Nausea And Vomiting and Swelling  . Nph Iletin I (Insulin Isophane, Mixed Nph) Itching, Nausea And Vomiting and Swelling    Physical Exam: Filed Vitals:   07/17/11 1035  BP: 136/82  Pulse: 128  Temp: 97.4 F (36.3 C)  Weight: 190lbs. HEENT:  Sclerae anicteric, conjunctivae pink.  Oropharynx clear.  No mucositis or candidiasis.   Nodes:  No cervical, supraclavicular,  or axillary lymphadenopathy palpated.  Breast Exam:  Deferred.   Lungs:  Clear to auscultation bilaterally.  No crackles, rhonchi, or wheezes.   Heart:  Regular rate and rhythm. ABD:  Soft, nontender with normal bowel sounds. Insulin pump in place. Extremities:  Left upper extremity fullness consistent with lymphedema, has improved significantly, compression sleeve in place.  No evidence of palpable venous cords or evidence of streaking, but evidence of bilateral lower extremity 1+ edema.  Good range of motion.   NEURO: Alert and oriented x3.  Lower extremity muscular strength is intact.  Lab Results: Lab Results  Component Value Date   WBC 11.0* 07/17/2011   HGB 10.4* 07/17/2011   HCT 31.6* 07/17/2011   MCV 89.0 07/17/2011   PLT 419* 07/17/2011   NEUTROABS 9.4* 07/17/2011     Chemistry      Component Value Date/Time   NA 134* 07/17/2011 1002   K 4.2 07/17/2011 1002   CL 99 07/17/2011 1002   CO2 25 07/17/2011 1002   BUN 10 07/17/2011 1002  CREATININE 0.58 07/17/2011 1002      Component Value Date/Time   CALCIUM 9.7 07/17/2011 1002   ALKPHOS 140* 07/17/2011 1002   AST 10 07/17/2011 1002   ALT 9 07/17/2011 1002   BILITOT 0.4 07/17/2011 1002     Radiology data: 07/17/11 CHEST - 2 VIEW  Comparison: 02/21/2011.  Findings: The cardiomediastinal silhouette is unremarkable.  A right subclavian Port-A-Cath is again identified with tip  overlying the cavoatrial junction. Left mastectomy noted. There is no evidence of focal airspace disease, pulmonary edema, suspicious pulmonary nodule/mass, pleural effusion, or pneumothorax. No acute bony abnormalities are identified. A mild thoracic scoliosis is unchanged.  IMPRESSION:  No evidence of acute cardiopulmonary disease.  Original Report Authenticated By: Rosendo Gros, M.D.    Assessment:  Marcianne is a 41 year old [place] woman with a newly diagnosed pathologically staged T2, N2 a triple negative left breast carcinoma, s/p left  modified radical mastectomy with axillary lymph node dissection on 02/21/2011. She is participating in NSABP B-49, and has been randomized to 6 cycles of every 3 week Taxotere/Cytoxan with Neulasta support on day 2, held with cycle 2.  Currently day 1 cycle 6/6.  2. Insulin pump dependent diabetic.  3. Severe afebrile neutropenia due to Neulasta being held on day 2, normalization of total white blood cell count and ANC, though to be restarted with cycle 3.  4. Excessive eye tearing, secondary to Taxotere-based therapy, improving on lubricating eyedrops.  5. Grade 1 nailbed sensitivity, secondary to Taxotere-based therapy, improvement with utilization of vinegar soaks.  6. Left upper extremity lymphedema, improved as compared to last week.  7. Grade 3 fatigability.  8. Increasing peripheral neuropathy not frankly affecting ADLs, but increasing in area affected.  9. Dry non-productive cough-CXR clear.  Case reviewed with Dr. Pierce Crane, who also examined patient.  ECOG: 1  Plan:  Rafaela will receive day 1 cycle 6 of every 3 week Taxotere/Cytoxan with reintroduction of Neulasta for granulocyte support per NSABP B-39 protocol. She was encouraged to rest, monitor blood sugars closely, and to contact us if any difficulty should arise. She will increase her gabapentin to 600mg  three times per day. She was given a prescription for bilateral TED hose last week, but I will also start her on a brief course of Lasix 20mg  orally daily for the next week.  She will work hard to obtain potassium via her diet, with a repeat CMET with her 1 week follow up. This plan was reviewed with the patient, who voices understanding and agreement.  She knows to call with any changes or problems.    Rayette Mogg T, PA-C 07/17/2011

## 2011-07-17 NOTE — Telephone Encounter (Signed)
gve the pt her cxr referral for today at [facility]

## 2011-07-17 NOTE — Progress Notes (Signed)
07/17/11 at 12:57am- NSABP B-49 cycle 6, day 1 study notes - The pt was into the cancer center this am for her 6 th cycle of TC on the B-49 study.  The pt was very excited that this is her last chemo day.  The pt's cbc/diff and stat chemistries were reviewed by Debbora Presto, PA and Dr. Donnie Coffin prior to her treatment.  The pt was also seen and examined by both Dr. Donnie Coffin and Debbora Presto, PA.  The pt stated that she has developed a cough over the last week.  Her weight was also up 4 lbs in the last week.  The pt has not had a 10% weight change from baseline.  Therefore, her doses will not be changed.  The pt remains at a dose level -1.  The pt's lungs sounded clear to Dr. Donnie Coffin and his PA.  A stat chest x-ray was ordered to rule out any cardiac and lung issues.  The chest x-ray revealed "no evidence of acute cardiopulmonary disease".   Dr. Donnie Coffin felt that the pt was ready for her last cycle.  The pt also is experiencing some nail changes (grade 1), mild neuropathy (grade 1- not affecting her ADL's), moderate fatigue (grade 2), and some lower extremity edema (grade 1).  The research nurse spoke to Floyd, chemo nurse, about the pt's treatment.  A sign was given to the nurse regarding the pt's treatment administration.  The pt is aware of her last neurontin scheduled for tomorrow.  The pt's ECOG =1.  The pt was given a prescription to take Lasix for her edema.  The pt will be seen next week for further evaluation.

## 2011-07-17 NOTE — Patient Instructions (Addendum)
[facility] Discharge Instructions for Patients Receiving Chemotherapy  Today you received the following chemotherapy agents Taxotere and Cytoxan.  To help prevent nausea and vomiting after your treatment, we encourage you to take your nausea medication.   If you develop nausea and vomiting that is not controlled by your nausea medication, call the clinic. If it is after clinic hours your family physician or the after hours number for the clinic or go to the Emergency Department.   BELOW ARE SYMPTOMS THAT SHOULD BE REPORTED IMMEDIATELY:  *FEVER GREATER THAN 100.5 F  *CHILLS WITH OR WITHOUT FEVER  NAUSEA AND VOMITING THAT IS NOT CONTROLLED WITH YOUR NAUSEA MEDICATION  *UNUSUAL SHORTNESS OF BREATH  *UNUSUAL BRUISING OR BLEEDING  TENDERNESS IN MOUTH AND THROAT WITH OR WITHOUT PRESENCE OF ULCERS  *URINARY PROBLEMS  *BOWEL PROBLEMS  UNUSUAL RASH Items with * indicate a potential emergency and should be followed up as soon as possible.  One of the nurses will contact you 24 hours after your treatment. Please let the nurse know about any problems that you may have experienced. Feel free to call the clinic you have any questions or concerns. The clinic phone number is [phone].   I have been informed and understand all the instructions given to me. I know to contact the clinic, my physician, or go to the Emergency Department if any problems should occur. I do not have any questions at this time, but understand that I may call the clinic during office hours   should I have any questions or need assistance in obtaining follow up care.    __________________________________________  _____________  __________ Signature of Patient or Authorized Representative            Date                   Time    __________________________________________ Nurse's Signature    

## 2011-07-18 ENCOUNTER — Ambulatory Visit (HOSPITAL_BASED_OUTPATIENT_CLINIC_OR_DEPARTMENT_OTHER): Payer: BC Managed Care – PPO

## 2011-07-18 VITALS — BP 121/79 | HR 97 | Temp 97.6°F

## 2011-07-18 DIAGNOSIS — C50419 Malignant neoplasm of upper-outer quadrant of unspecified female breast: Secondary | ICD-10-CM | POA: Diagnosis not present

## 2011-07-18 DIAGNOSIS — C50919 Malignant neoplasm of unspecified site of unspecified female breast: Secondary | ICD-10-CM

## 2011-07-18 DIAGNOSIS — Z5189 Encounter for other specified aftercare: Secondary | ICD-10-CM

## 2011-07-18 MED ORDER — PEGFILGRASTIM INJECTION 6 MG/0.6ML
6.0000 mg | Freq: Once | SUBCUTANEOUS | Status: AC
  Administered 2011-07-18: 6 mg via SUBCUTANEOUS
  Filled 2011-07-18: qty 0.6

## 2011-07-19 ENCOUNTER — Other Ambulatory Visit: Payer: Self-pay | Admitting: Oncology

## 2011-07-19 DIAGNOSIS — C50919 Malignant neoplasm of unspecified site of unspecified female breast: Secondary | ICD-10-CM

## 2011-07-22 ENCOUNTER — Emergency Department (HOSPITAL_COMMUNITY)
Admission: EM | Admit: 2011-07-22 | Discharge: 2011-07-22 | Disposition: A | Discharge status: Home / Self Care | Payer: BC Managed Care – PPO | Attending: Emergency Medicine | Admitting: Emergency Medicine

## 2011-07-22 ENCOUNTER — Encounter (HOSPITAL_COMMUNITY): Payer: Self-pay | Admitting: *Deleted

## 2011-07-22 ENCOUNTER — Emergency Department (HOSPITAL_COMMUNITY): Payer: BC Managed Care – PPO

## 2011-07-22 DIAGNOSIS — I82629 Acute embolism and thrombosis of deep veins of unspecified upper extremity: Secondary | ICD-10-CM | POA: Insufficient documentation

## 2011-07-22 DIAGNOSIS — Z794 Long term (current) use of insulin: Secondary | ICD-10-CM | POA: Insufficient documentation

## 2011-07-22 DIAGNOSIS — M79609 Pain in unspecified limb: Secondary | ICD-10-CM | POA: Insufficient documentation

## 2011-07-22 DIAGNOSIS — C50919 Malignant neoplasm of unspecified site of unspecified female breast: Secondary | ICD-10-CM | POA: Insufficient documentation

## 2011-07-22 DIAGNOSIS — R0602 Shortness of breath: Secondary | ICD-10-CM | POA: Insufficient documentation

## 2011-07-22 DIAGNOSIS — E785 Hyperlipidemia, unspecified: Secondary | ICD-10-CM | POA: Insufficient documentation

## 2011-07-22 DIAGNOSIS — Z79899 Other long term (current) drug therapy: Secondary | ICD-10-CM | POA: Insufficient documentation

## 2011-07-22 DIAGNOSIS — I10 Essential (primary) hypertension: Secondary | ICD-10-CM | POA: Insufficient documentation

## 2011-07-22 DIAGNOSIS — E119 Type 2 diabetes mellitus without complications: Secondary | ICD-10-CM | POA: Insufficient documentation

## 2011-07-22 DIAGNOSIS — M7989 Other specified soft tissue disorders: Secondary | ICD-10-CM | POA: Insufficient documentation

## 2011-07-22 LAB — BASIC METABOLIC PANEL
BUN: 9 mg/dL (ref 6–23)
CO2: 27 mEq/L (ref 19–32)
Calcium: 9.1 mg/dL (ref 8.4–10.5)
Chloride: 102 mEq/L (ref 96–112)
Creatinine, Ser: 0.65 mg/dL (ref 0.50–1.10)
GFR calc Af Amer: >90 mL/min (ref >90)
GFR calc non Af Amer: >90 mL/min (ref >90)
Glucose, Bld: 200 mg/dL — ABNORMAL HIGH (ref 70–99)
Potassium: 3.8 mEq/L (ref 3.5–5.1)
Sodium: 139 mEq/L (ref 135–145)

## 2011-07-22 LAB — DIFFERENTIAL
Basophils Absolute: 0.1 10*3/uL (ref 0.0–0.1)
Basophils Relative: 2 % — ABNORMAL HIGH (ref 0–1)
Eosinophils Absolute: 0 10*3/uL (ref 0.0–0.7)
Eosinophils Relative: 1 % (ref 0–5)
Lymphocytes Relative: 27 % (ref 12–46)
Lymphs Abs: 1.1 10*3/uL (ref 0.7–4.0)
Monocytes Absolute: 0.2 10*3/uL (ref 0.1–1.0)
Monocytes Relative: 4 % (ref 3–12)
Neutro Abs: 2.7 10*3/uL (ref 1.7–7.7)
Neutrophils Relative %: 66 % (ref 43–77)

## 2011-07-22 LAB — CBC
HCT: 28.7 % — ABNORMAL LOW (ref 36.0–46.0)
Hemoglobin: 9.4 g/dL — ABNORMAL LOW (ref 12.0–15.0)
MCH: 29.4 pg (ref 26.0–34.0)
MCHC: 32.8 g/dL (ref 30.0–36.0)
MCV: 89.7 fL (ref 78.0–100.0)
Platelets: 232 10*3/uL (ref 150–400)
RBC: 3.2 MIL/uL — ABNORMAL LOW (ref 3.87–5.11)
RDW: 15.5 % (ref 11.5–15.5)
WBC: 4.1 10*3/uL (ref 4.0–10.5)

## 2011-07-22 LAB — PROTIME-INR
INR: 1.12 (ref 0.00–1.49)
Prothrombin Time: 14.6 seconds (ref 11.6–15.2)

## 2011-07-22 LAB — APTT: aPTT: 61 seconds — ABNORMAL HIGH (ref 24–37)

## 2011-07-22 MED ORDER — HEPARIN SOD (PORK) LOCK FLUSH 100 UNIT/ML IV SOLN
500.0000 [IU] | INTRAVENOUS | Status: AC | PRN
  Administered 2011-07-22: 500 [IU]

## 2011-07-22 MED ORDER — ENOXAPARIN SODIUM 100 MG/ML ~~LOC~~ SOLN
1.0000 mg/kg | Freq: Once | SUBCUTANEOUS | Status: AC
  Administered 2011-07-22: 85 mg via SUBCUTANEOUS
  Filled 2011-07-22 (×3): qty 1

## 2011-07-22 MED ORDER — RIVAROXABAN 15 MG PO TABS: 15.0000 mg | ORAL_TABLET | Freq: Two times a day (BID) | ORAL | Status: DC

## 2011-07-22 MED ORDER — ENOXAPARIN (LOVENOX) PATIENT EDUCATION KIT
PACK | Freq: Once | Status: AC
  Administered 2011-07-22: 13:00:00
  Filled 2011-07-22: qty 1

## 2011-07-22 MED ORDER — IOHEXOL 350 MG/ML SOLN
100.0000 mL | Freq: Once | INTRAVENOUS | Status: AC | PRN
  Administered 2011-07-22: 100 mL via INTRAVENOUS

## 2011-07-22 MED ORDER — RIVAROXABAN 15 MG PO TABS: 15.0000 mg | ORAL_TABLET | Freq: Once | ORAL | Status: DC

## 2011-07-22 NOTE — Progress Notes (Addendum)
ANTICOAGULATION CONSULT NOTE - Initial Consult  Pharmacy Consult for Xarelto Indication: DVT  Allergies  Allergen Reactions  . Hydrocodone-Acetaminophen Itching and Swelling  . Insulin Glargine Itching, Nausea And Vomiting and Swelling  . Nph Iletin I (Insulin Isophane, Mixed Nph) Itching, Nausea And Vomiting and Swelling    Patient Measurements: Height: 5\' 5"  (165.1 cm) Weight: 190 lb 8 oz (86.41 kg) IBW/kg (Calculated) : 57    Vital Signs: Temp: 98.8 F (37.1 C) (06/22 0510) Temp src: Oral (06/22 0510) BP: 129/67 mmHg (06/22 1059) Pulse Rate: 107  (06/22 1059)  Labs:  Basename 07/22/11 0914 07/22/11 0650  HGB -- 9.4*  HCT -- 28.7*  PLT -- 232  APTT 61* --  LABPROT 14.6 --  INR 1.12 --  HEPARINUNFRC -- --  CREATININE -- 0.65  CKTOTAL -- --  CKMB -- --  TROPONINI -- --    Estimated Creatinine Clearance: 99.5 ml/min (by C-G formula based on Cr of 0.65).   Medical History: Past Medical History  Diagnosis Date  . Breast mass in female     fibrocystic changes  . Hyperlipidemia   . Nausea   . Hypertension   . Nausea & vomiting     for approx. 2 weeks  . Cataracts, bilateral   . Diabetes mellitus     insulin dependant - uses pump, Dr. Lucianne [name]  . Breast cancer      Assessment: 41 yo female with a DVT in right subclavian and axillary veins presently being treated with chemo for breast cancer. Received  Lovenox 85 mg (1 mg/kg) dose at 1250. In converting from Lovenox, the first dose of xarelto should be 0-2 hours prior to the next regularly scheduled dose of Lovenox (~11pm tonight).  CrCl ~100 ml/min. AST/ALT wnl. Baseline INR 1.12. Pt to be discharged from the [email or web address] pt and family on xarelto.   Goal of Therapy:  Therapeutic anticoagulation Monitor platelets by anticoagulation protocol: Yes   Plan:  1. Xarelto 15 mg PO twice daily with food for total of 21 days (until August 11, 2011) then take 20 mg PO once daily with food. The first dose should be taken  with supper tonight at the earliest (no later than 10-11 pm tonight).  2. F/u renal function, s/sx bleeding   Concha Norway 07/22/2011,1:24 PM

## 2011-07-22 NOTE — ED Notes (Signed)
Pt awoke on Fri am feeling R shoulder and axillary pain and R arm edema.  Pt is presently being tx with chemo for breast ca - she was given her chemo shot on Tues in her R deltoid.

## 2011-07-22 NOTE — ED Notes (Signed)
Reports swelling of right arm that started on Friday. No known trauma. R arm and hand swollen - skin taut. 2+radial pulses with <3sec cap RF

## 2011-07-22 NOTE — Discharge Instructions (Signed)
Followup with your doctor Monday or Tuesday for a recheck.  Return here for any worsening in your condition.  Return here for any chest pain, or shortness of breath.

## 2011-07-22 NOTE — ED Notes (Signed)
Patient transported to CT 

## 2011-07-22 NOTE — ED Notes (Signed)
Pharmacy called regarding Lovenox. States that they will send it.

## 2011-07-22 NOTE — Progress Notes (Signed)
Right upper extremity venous duplex completed.  Preliminary report is positive for DVT in the right subclavian and axillary veins.

## 2011-07-22 NOTE — ED Notes (Signed)
Pharmacy called for pt education on xarelto

## 2011-07-22 NOTE — ED Provider Notes (Signed)
History     CSN: [id number]  Arrival date & time 07/22/11  0504   First MD Initiated Contact with Patient 07/22/11 [phone]      Chief Complaint  Patient presents with  . Arm Pain    (Consider location/radiation/quality/duration/timing/severity/associated sxs/prior treatment) HPI Patient presents emergency department with right arm swelling that began yesterday morning.  Patient, states that she has swelling from her fingers to her shoulder on the right extremity.  Patient is currently being treated for left breast cancer.  Patient, states that she has not had any chest pain, shortness of breath, weakness, nausea, vomiting, or abdominal pain.  Patient denies coughing up blood.  Patient, states she did not take any treatment prior to arrival for the pain and swelling in her arm.  Patient, states that she has had some mild shortness of breath throughout her treatment for breast cancer. Past Medical History  Diagnosis Date  . Breast mass in female     fibrocystic changes  . Hyperlipidemia   . Nausea   . Hypertension   . Nausea & vomiting     for approx. 2 weeks  . Cataracts, bilateral   . Diabetes mellitus     insulin dependant - uses pump, Dr. Lucianne [name]  . Breast cancer     Past Surgical History  Procedure Date  . Bladder tacking 01/2009  . Shoulder surgery 04/26/10    left  . Abdominal hysterectomy 2002  . Tubal ligation 12/1999  . Left mastectomy 02/21/2011  . Portacath placement 02/21/2011    Procedure: INSERTION PORT-A-CATH;  Surgeon: Ernestene Mention, MD;  [facility];  Service: General;  Laterality: N/A;    Family History  Problem Relation Age of Onset  . Hypertension    . Diabetes    . Deep vein thrombosis    . Kidney disease    . Cancer Maternal Aunt     breast  . Cancer Maternal Uncle     History  Substance Use Topics  . Smoking status: Never Smoker   . Smokeless tobacco: Never Used  . Alcohol Use: No    OB History    Grav Para Term Preterm Abortions TAB  SAB Ect Mult Living                  Review of Systems All other systems negative except as documented in the HPI. All pertinent positives and negatives as reviewed in the HPI.  Allergies  Hydrocodone-acetaminophen; Insulin glargine; and Nph iletin i  Home Medications   Current Outpatient Rx  Name Route Sig Dispense Refill  . FUROSEMIDE 20 MG PO TABS Oral Take 20 mg by mouth daily.    Marland Kitchen GABAPENTIN 300 MG PO CAPS Oral Take 600 mg by mouth 3 (three) times daily.    . INSULIN PUMP Subcutaneous Inject into the skin continuous.    Marland Kitchen LISINOPRIL 10 MG PO TABS Oral Take 10 mg by mouth daily. For blood pressure     . LORAZEPAM 0.5 MG PO TABS Oral Take 1 tablet (0.5 mg total) by mouth every 6 (six) hours as needed (Nausea or vomiting). 30 tablet 0  . ONDANSETRON HCL 8 MG PO TABS  Take 1 tablet two times a day starting the day after chemo for 3 days. Then take 1 tab two times a day as needed for nausea or vomiting. 30 tablet 1  . PROCHLORPERAZINE MALEATE 10 MG PO TABS Oral Take 1 tablet (10 mg total) by mouth every 6 (six) hours  as needed (Nausea or vomiting). 30 tablet 1  . PROCHLORPERAZINE 25 MG RE SUPP Rectal Place 1 suppository (25 mg total) rectally every 12 (twelve) hours as needed for nausea. 12 suppository 3  . ZOLPIDEM TARTRATE 5 MG PO TABS  TAKE ONE TABLET BY MOUTH AT BEDTIME. 30 tablet 0  . DEXAMETHASONE 4 MG PO TABS  Take 2 tablets two times a day the day before Taxotere. Then take 2 tabs two times a day starting the day after chemo for 3 days. 30 tablet 1  . RIVAROXABAN 15 MG PO TABS Oral Take 1 tablet (15 mg total) by mouth 2 (two) times daily. 42 tablet 0    BP 129/67  Pulse 107  Temp 98.8 F (37.1 C) (Oral)  Resp 18  Ht 5\' 5"  (1.651 m)  Wt 190 lb 8 oz (86.41 kg)  BMI 31.70 kg/m2  SpO2 97%  Physical Exam  Constitutional: She is oriented to person, place, and time. She appears well-developed and well-nourished. No distress.  Eyes: Pupils are equal, round, and reactive to  light.  Cardiovascular: Normal rate, regular rhythm and normal heart sounds.  Exam reveals no gallop and no friction rub.   No murmur heard. Pulmonary/Chest: Effort normal and breath sounds normal. No respiratory distress.  Musculoskeletal:       Arms: Neurological: She is alert and oriented to person, place, and time.  Skin: Skin is warm and dry.    ED Course  Procedures (including critical care time)  Labs Reviewed  CBC - Abnormal; Notable for the following:    RBC 3.20 (*)     Hemoglobin 9.4 (*)     HCT 28.7 (*)     All other components within normal limits  DIFFERENTIAL - Abnormal; Notable for the following:    Basophils Relative 2 (*)     All other components within normal limits  BASIC METABOLIC PANEL - Abnormal; Notable for the following:    Glucose, Bld 200 (*)     All other components within normal limits  APTT - Abnormal; Notable for the following:    aPTT 61 (*)     All other components within normal limits  PROTIME-INR   Ct Angio Chest W/cm &/or Wo Cm  07/22/2011  *RADIOLOGY REPORT*  Clinical Data: Right subclavian DVT by report, history of left breast cancer for which the patient underwent mastectomy. Shortness of breath and tachycardia currently.  CT ANGIOGRAPHY CHEST  Technique:  Multidetector CT imaging of the chest using the standard protocol during bolus administration of intravenous contrast. Multiplanar reconstructed images including MIPs were obtained and reviewed to evaluate the vascular anatomy.  Contrast: OMNIPAQUE IOHEXOL 350 MG/ML SOLN  Comparison: PET CT 02/13/2011.  Findings: Contrast opacification of the pulmonary arteries is good. Respiratory motion blurred images of the bases, and lung volumes are low.  Overall, the study is of moderate to good diagnostic quality.  No filling defects within either main pulmonary artery or their branches in either lung to suggest pulmonary embolism.  Heart size upper normal, with a borderline left ventricular  enlargement. Right subclavian Port-A-Cath tip in the upper right atrium.  No pericardial effusion.  No visible coronary artery calcification. No visible atherosclerosis involving the thoracic or upper abdominal aorta or their branches.  Direct origin of the left vertebral artery from the aortic arch. On delayed images, the SVC is widely patent.  Pain nonocclusive small filling defect is noted in the right subclavian vein.  Low lung volumes with dependent atelectasis  in the lower lobes and in the posterior upper lobes.  Lungs otherwise clear.  No pulmonary parenchymal nodules or masses.  No pleural effusions.  Minimal pleural thickening bilaterally.  Central airways patent without significant bronchial wall thickening.  Thymic tissue in the anterior-superior mediastinum.  Surgical clips left axilla from prior node dissection.  No enlarged lymph nodes and the mediastinum, either hilum, or either axilla.  Left mastectomy without evidence of recurrent tumor in the left chest wall.  Visualized extreme upper abdomen unremarkable.  Bone window images demonstrate no evidence of osseous metastatic disease.IMPRESSION:  1.  No evidence of pulmonary embolism. 2.  Low lung volumes with atelectasis in the lower lobes and posterior upper lobes.  No acute cardiopulmonary disease otherwise. 3.  No evidence of metastatic disease in the chest. 4.  Nonocclusive clot in the right subclavian vein.  Widely patent SVC.  Original Report Authenticated By: Arnell Sieving, M.D.     1. DVT of upper extremity (deep vein thrombosis)    I spoke with the family practice center about the patient and they would like her started on Xarelto.  Patient had received Lovenox injection already.  Pharmacy came and counseled patient on the Xarelto.  All questions and the plan were given to the patient.  The patient voices an understanding of the plan.  She is told to return to the emergency department for any worsening in her condition.   MDM    MDM Reviewed: nursing note and vitals Interpretation: labs, ultrasound and CT scan Consults: primary care provider           Carlyle Dolly, PA-C 07/22/11 1443

## 2011-07-22 NOTE — ED Provider Notes (Signed)
Medical screening examination/treatment/procedure(s) were conducted as a shared visit with non-physician practitioner(s) and myself.  I personally evaluated the patient during the encounter 41 yo pt with Portacath has developed a DVT in the right arm.  CT angio of chest shows no PE.  Her doctors at [facility] requested she be anticoagulated with Xarelto.   Carleene Cooper III, MD 07/22/11 [phone]

## 2011-07-24 ENCOUNTER — Other Ambulatory Visit (HOSPITAL_BASED_OUTPATIENT_CLINIC_OR_DEPARTMENT_OTHER): Payer: BC Managed Care – PPO | Admitting: Lab

## 2011-07-24 ENCOUNTER — Encounter: Payer: Self-pay | Admitting: *Deleted

## 2011-07-24 ENCOUNTER — Telehealth: Payer: Self-pay | Admitting: *Deleted

## 2011-07-24 ENCOUNTER — Ambulatory Visit (HOSPITAL_BASED_OUTPATIENT_CLINIC_OR_DEPARTMENT_OTHER): Payer: BC Managed Care – PPO | Admitting: Physician Assistant

## 2011-07-24 VITALS — BP 131/85 | HR 120 | Temp 98.6°F | Ht 65.0 in | Wt 189.1 lb

## 2011-07-24 DIAGNOSIS — C50419 Malignant neoplasm of upper-outer quadrant of unspecified female breast: Secondary | ICD-10-CM

## 2011-07-24 DIAGNOSIS — I82629 Acute embolism and thrombosis of deep veins of unspecified upper extremity: Secondary | ICD-10-CM

## 2011-07-24 DIAGNOSIS — I890 Lymphedema, not elsewhere classified: Secondary | ICD-10-CM | POA: Diagnosis not present

## 2011-07-24 DIAGNOSIS — D709 Neutropenia, unspecified: Secondary | ICD-10-CM

## 2011-07-24 DIAGNOSIS — C50912 Malignant neoplasm of unspecified site of left female breast: Secondary | ICD-10-CM

## 2011-07-24 DIAGNOSIS — I82B19 Acute embolism and thrombosis of unspecified subclavian vein: Secondary | ICD-10-CM

## 2011-07-24 LAB — COMPREHENSIVE METABOLIC PANEL
ALT: 33 U/L (ref 0–35)
AST: 30 U/L (ref 0–37)
Calcium: 9.3 mg/dL (ref 8.4–10.5)
Chloride: 100 mEq/L (ref 96–112)
Creatinine, Ser: 0.65 mg/dL (ref 0.50–1.10)
Potassium: 3.4 mEq/L — ABNORMAL LOW (ref 3.5–5.3)
Sodium: 137 mEq/L (ref 135–145)

## 2011-07-24 LAB — CBC WITH DIFFERENTIAL/PLATELET
Basophils Absolute: 0 10*3/uL (ref 0.0–0.1)
EOS%: 0.3 % (ref 0.0–7.0)
Eosinophils Absolute: 0 10*3/uL (ref 0.0–0.5)
HGB: 9.3 g/dL — ABNORMAL LOW (ref 11.6–15.9)
MONO#: 2.2 10*3/uL — ABNORMAL HIGH (ref 0.1–0.9)
NEUT#: 6.8 10*3/uL — ABNORMAL HIGH (ref 1.5–6.5)
RDW: 16.5 % — ABNORMAL HIGH (ref 11.2–14.5)
WBC: 10.7 10*3/uL — ABNORMAL HIGH (ref 3.9–10.3)
lymph#: 1.6 10*3/uL (ref 0.9–3.3)

## 2011-07-24 LAB — PROTIME-INR: INR: 1.7 — ABNORMAL LOW (ref 2.00–3.50)

## 2011-07-24 NOTE — Telephone Encounter (Signed)
Message copied by Aram Beecham on Mon Jul 24, 2011 10:34 AM ------      Message from: Shelva Majestic      Created: Sat Jul 22, 2011  1:07 PM      Regarding: DVT in RUE. Needs follow up in clinic.        Please call patient to schedule follow up for early next week. Started on xarelto in ED and discharged home.             Thanks, Jeannett Senior

## 2011-07-24 NOTE — Progress Notes (Signed)
Hematology and Oncology Follow Up Visit  [name] [id number] 11/15/1970 42 y.o. 07/24/2011    HPI: [name] is a 41 year old [place] woman with a newly diagnosed pathologically staged T2, N2 a triple negative left breast carcinoma, s/p left modified radical mastectomy with axillary lymph node dissection on 02/21/2011. She is participating in NSABP B-49, and has been randomized to 6 cycles of every 3 week Taxotere/Cytoxan with Neulasta support on day 2, held with cycle 2.  Currently day 7 cycle 6/6.  2. Insulin pump dependent diabetic.  3. Severe afebrile neutropenia due to Neulasta being held on day 2, normalization of total white blood cell count and ANC, though to be restarted with cycle 3.  4. Excessive eye tearing, secondary to Taxotere-based therapy, improving on lubricating eyedrops.  5. Grade 1 nailbed sensitivity, secondary to Taxotere-based therapy, improvement with utilization of vinegar soaks.  6. Left upper extremity lymphedema, improved as compared to last week.  7. Grade 3 fatigability.  8. Increasing peripheral neuropathy not affecting ADL  Interim History:   [name] is seen today for followup, currently day 7 cycle 6/6 planned every 3 week doses of Taxotere/Cytoxan with reintroduction of Neulasta for granulocyte support on day 2 according to NSABP B-49 protocol.  She has been diagnosed with a right upper extremity DVT with a subclavian vein thrombosis after presenting to the emergency department on 07/22/2011 with right shoulder pain which then proceeded to right upper extremity edema. This happened fairly suddenly. Spiral CT was obtained which did not reveal evidence of pulmonary emboli. She is currently on Xeralto 15 mg by mouth twice a day.  She denies any new numbness or tingling. She denies any unexplained fevers, chills, or night sweats. Her appetite has been excellent. She denies any nausea, emesis, diarrhea.  She has noted increased tendency  towards constipation. She has noted a bit of increase in neuropathy symptoms, no PPE changes.  She does have excessive eye tearing and is utilizing lubricating eyedrops effectively. A detailed review of systems is otherwise noncontributory as noted below.  Review of Systems: Constitutional:  Increasing fatigability. Eyes: uses glasses ENT: no complaints Cardiovascular: no chest pain or dyspnea on exertion Respiratory: no cough, shortness of breath, or wheezing Neurological: no TIA or stroke symptoms Dermatological: negative Gastrointestinal: no abdominal pain, change in bowel habits, or black or bloody stools Genito-Urinary: no dysuria, trouble voiding, or hematuria Hematological and Lymphatic: negative Breast: negative Musculoskeletal: Evidence of left upper extremity edema, improved as compared to last week, compression dressing in place.. Remaining ROS negative.   Medications:   I have reviewed the patient's current medications.  Current Outpatient Prescriptions  Medication Sig Dispense Refill  . dexamethasone (DECADRON) 4 MG tablet Take 2 tablets two times a day the day before Taxotere. Then take 2 tabs two times a day starting the day after chemo for 3 days.  30 tablet  1  . furosemide (LASIX) 20 MG tablet Take 20 mg by mouth daily.      Marland Kitchen gabapentin (NEURONTIN) 300 MG capsule Take 600 mg by mouth 3 (three) times daily.      . Insulin Human (INSULIN PUMP) 100 unit/ml SOLN Inject into the skin continuous.      Marland Kitchen lisinopril (PRINIVIL,ZESTRIL) 10 MG tablet Take 10 mg by mouth daily. For blood pressure       . LORazepam (ATIVAN) 0.5 MG tablet Take 1 tablet (0.5 mg total) by mouth every 6 (six) hours as needed (Nausea or vomiting).  30 tablet  0  . ondansetron (ZOFRAN) 8 MG tablet Take 1 tablet two times a day starting the day after chemo for 3 days. Then take 1 tab two times a day as needed for nausea or vomiting.  30 tablet  1  . prochlorperazine (COMPAZINE) 10 MG tablet Take 1 tablet  (10 mg total) by mouth every 6 (six) hours as needed (Nausea or vomiting).  30 tablet  1  . prochlorperazine (COMPAZINE) 25 MG suppository Place 1 suppository (25 mg total) rectally every 12 (twelve) hours as needed for nausea.  12 suppository  3  . Rivaroxaban (XARELTO) 15 MG TABS tablet Take 1 tablet (15 mg total) by mouth 2 (two) times daily.  42 tablet  0  . zolpidem (AMBIEN) 5 MG tablet TAKE ONE TABLET BY MOUTH AT BEDTIME.  30 tablet  0    Allergies:  Allergies  Allergen Reactions  . Hydrocodone-Acetaminophen Itching and Swelling  . Insulin Glargine Itching, Nausea And Vomiting and Swelling  . Nph Iletin I (Insulin Isophane, Mixed Nph) Itching, Nausea And Vomiting and Swelling    Physical Exam: Filed Vitals:   07/24/11 0853  BP: 131/85  Pulse: 120  Temp: 98.6 F (37 C)  Weight: 189 lbs. HEENT:  Sclerae anicteric, conjunctivae pink.  Oropharynx clear.  No mucositis or candidiasis.   Nodes:  No cervical, supraclavicular, or axillary lymphadenopathy palpated.  Breast Exam:  Deferred.   Lungs:  Clear to auscultation bilaterally.  No crackles, rhonchi, or wheezes.   Heart:  Regular rate and rhythm. ABD:  Soft, nontender with normal bowel sounds. Insulin pump in place. Extremities:  Left upper extremity fullness consistent with lymphedema, has improved significantly, compression sleeve in place.  No evidence of palpable venous cords or evidence of streaking, but evidence of bilateral lower extremity 1+ edema.  Good range of motion.  The right upper extremity does show evidence of edema but good range of motion. No Frank erythema.   NEURO: Alert and oriented x3.  Lower extremity muscular strength is intact.  Lab Results: Lab Results  Component Value Date   WBC 10.7* 07/24/2011   HGB 9.3* 07/24/2011   HCT 28.5* 07/24/2011   MCV 89.2 07/24/2011   PLT 245 07/24/2011   NEUTROABS 6.8* 07/24/2011     Chemistry      Component Value Date/Time   NA 137 07/24/2011 0814   K 3.4* 07/24/2011  0814   CL 100 07/24/2011 0814   CO2 26 07/24/2011 0814   BUN 7 07/24/2011 0814   CREATININE 0.65 07/24/2011 0814      Component Value Date/Time   CALCIUM 9.3 07/24/2011 0814   ALKPHOS 260* 07/24/2011 0814   AST 30 07/24/2011 0814   ALT 33 07/24/2011 0814   BILITOT 0.3 07/24/2011 0814      Assessment:  [name] is a 41 year old [place] woman with a newly diagnosed pathologically staged T2, N2 a triple negative left breast carcinoma, s/p left modified radical mastectomy with axillary lymph node dissection on 02/21/2011. She is participating in NSABP B-49, and has been randomized to 6 cycles of every 3 week Taxotere/Cytoxan with Neulasta support on day 2, held with cycle 2.  Currently day 7 cycle 6/6.  2. Insulin pump dependent diabetic.  3. Severe afebrile neutropenia due to Neulasta being held on day 2, normalization of total white blood cell count and ANC, though to be restarted with cycle 3.  4. Excessive eye tearing, secondary to Taxotere-based therapy, improving on lubricating eyedrops.  5. Grade 1  nailbed sensitivity, secondary to Taxotere-based therapy, improvement with utilization of vinegar soaks.  6. Left upper extremity lymphedema, improved as compared to last week.  7. Grade 3 fatigability.  8. Increasing peripheral neuropathy not affecting ADLs.  9. Newly diagnosed right upper extremity DVT, involvement of the right subclavian vein. On Xarelto 15mg  by mouth twice a day.  Case reviewed with Dr. Pierce Crane.  ECOG: 1  Plan:  [name] will continue on Xeralto 50 mg by mouth twice a day. She is scheduled to see Dr. Luciano Cutter for radiation oncology consultation on 08/02/2011. She will then return to see Dr. Donnie Coffin in followup on 08/10/2011. This plan was reviewed with the patient, who voices understanding and agreement.  She knows to call with any changes or problems.   [name] T, PA-C 07/24/2011

## 2011-07-24 NOTE — Telephone Encounter (Signed)
Left message on patient voicemail to call back and schedule an ED follow up.

## 2011-07-24 NOTE — Progress Notes (Signed)
07/24/11 at 12:58pm- NSABP B-49 cycle 6, day 8 follow up visit- The pt was into the cancer center this am for her day 8 follow up visit.  The pt went to the ED for right arm pain on 07/22/11.  The pt was diagnosed with a nonocclusive clot in the right subclavian vein.  (Grade 2 - thromboembolic event).  The pt was given Lovenox in the ED and then was prescribed Xarelto to be taken as an outpatient for the next 6 months.  The pt was seen and examined today by Debbora Presto, PA and Dr. Donnie Coffin.  The pt said that she was experiencing some bone pain from her neulasta shot that was affecting her walking.  (bone pain-grade 2).  She reports some alteration in taste (grade 1), but she reports a very good appetite (weight is stable).  She complains of moderate fatigue.  She reports her bilateral LE edema has greatly improved.  She also reports her blood sugars have been below 200 in the past week.  The pt still reports a grade 1 nail issues ( no actual loss of nails).  The pt was in very good spirits and was happy that her chemotherapy is over. The pt is scheduled to see Dr. Michell Heinrich next week for her radiation consult.  She will see Dr. Donnie Coffin in 3 weeks for her required post cycle 6 follow up visit.

## 2011-08-01 ENCOUNTER — Encounter: Payer: Self-pay | Admitting: Radiation Oncology

## 2011-08-01 NOTE — Progress Notes (Signed)
41 year old female. Married.  Stay at home mother. Two sons and one daughter.   T2N2 triple negative LEFT brest ca s/p Left radical mastectomy with axillary dissection on 02/21/2011. 7 of 14 nodes positive. Participated in NSABP B-49 study and has been randomized to six cycles of every 3 week Taxotere/Cytoxan with Neulasta support on day 2. Dr. Derrell Lolling referred patient for plastic surgery consult.      AX: hydrocodone-acetaminophen, lantus, NPH No indication of a pacemaker No hx of radiation therapy

## 2011-08-02 ENCOUNTER — Encounter: Payer: Self-pay | Admitting: Radiation Oncology

## 2011-08-02 ENCOUNTER — Ambulatory Visit
Admission: RE | Admit: 2011-08-02 | Discharge: 2011-08-02 | Disposition: A | Discharge status: Home / Self Care | Payer: BC Managed Care – PPO | Source: Ambulatory Visit | Attending: Radiation Oncology | Admitting: Radiation Oncology

## 2011-08-02 ENCOUNTER — Encounter: Payer: Self-pay | Admitting: *Deleted

## 2011-08-02 VITALS — BP 133/79 | HR 101 | Temp 98.2°F | Resp 20 | Wt 192.2 lb

## 2011-08-02 DIAGNOSIS — Z510 Encounter for antineoplastic radiation therapy: Secondary | ICD-10-CM | POA: Insufficient documentation

## 2011-08-02 DIAGNOSIS — C50912 Malignant neoplasm of unspecified site of left female breast: Secondary | ICD-10-CM

## 2011-08-02 DIAGNOSIS — Z79899 Other long term (current) drug therapy: Secondary | ICD-10-CM | POA: Insufficient documentation

## 2011-08-02 DIAGNOSIS — E785 Hyperlipidemia, unspecified: Secondary | ICD-10-CM | POA: Insufficient documentation

## 2011-08-02 DIAGNOSIS — Z9010 Acquired absence of unspecified breast and nipple: Secondary | ICD-10-CM | POA: Insufficient documentation

## 2011-08-02 DIAGNOSIS — C773 Secondary and unspecified malignant neoplasm of axilla and upper limb lymph nodes: Secondary | ICD-10-CM | POA: Insufficient documentation

## 2011-08-02 DIAGNOSIS — I10 Essential (primary) hypertension: Secondary | ICD-10-CM | POA: Insufficient documentation

## 2011-08-02 DIAGNOSIS — Z794 Long term (current) use of insulin: Secondary | ICD-10-CM | POA: Insufficient documentation

## 2011-08-02 DIAGNOSIS — C50919 Malignant neoplasm of unspecified site of unspecified female breast: Secondary | ICD-10-CM | POA: Insufficient documentation

## 2011-08-02 DIAGNOSIS — E119 Type 2 diabetes mellitus without complications: Secondary | ICD-10-CM | POA: Insufficient documentation

## 2011-08-02 DIAGNOSIS — Z9221 Personal history of antineoplastic chemotherapy: Secondary | ICD-10-CM | POA: Insufficient documentation

## 2011-08-02 HISTORY — DX: Personal history of antineoplastic chemotherapy: Z92.21

## 2011-08-02 HISTORY — DX: Allergy, unspecified, initial encounter: T78.40XA

## 2011-08-02 HISTORY — DX: Lymphedema, not elsewhere classified: I89.0

## 2011-08-02 HISTORY — DX: Acute embolism and thrombosis of unspecified vein: I82.90

## 2011-08-02 NOTE — Progress Notes (Signed)
Please see the Nurse Progress Note in the MD Initial Consult Encounter for this patient. 

## 2011-08-02 NOTE — Progress Notes (Signed)
Radiation Oncology         [phone] ________________________________  Initial outpatient Consultation  Name: [name] MRN: [id number]  Date: 08/02/2011  DOB: 1970/12/13  REFERRING PHYSICIAN: Pierce Crane, MD  DIAGNOSIS: The encounter diagnosis was Cancer of left breast.  HISTORY OF PRESENT ILLNESS::[name] is a 41 y.o. female  who underwent a screening mammogram in December of this year. She had had a mammogram previously where a left breast mass was noted and biopsy but that biopsy was negative. It's interesting but she said she almost didn't even present for a mammogram due to her negative biopsy previously. On a diagnostic mammogram and ultrasound performed on December 18 5 nodules were noted. An image guided biopsy of one of the masses at the central position revealed invasive ductal carcinoma. Invasive ductal carcinoma was also noted and a left axillary lymph node. Both tumors were triple negative. My of the bilateral breasts was performed on 01/25/2011. This showed a 2.4 cm mass in the 12:00 position of the left breast. 4 additional masses were noted. A level I left axillary lymph node was also noted as well as some mildly prominent left level II retropectoral lymph nodes. No evidence of malignancy was noted on the right. No abnormal right lymph nodes were noted. She elected for left mastectomy and axillary lymph node dissection which was performed on 02/21/2011 by Dr. Derrell Lolling. Multifocal invasive ductal carcinoma grade 3 was noted with 5 foci of disease the largest measuring 2.4 cm. High-grade ductal carcinoma in situ was identified as well as lymphovascular invasion. The margins of resection were widely negative with the closest being 1 cm. 7/14 lymph nodes were noted as well. A nodule if tissue was also noted in an ectopic breast tissue in the axillary contents measuring 0.5 cm. An additional left axillary lymph node was also noted with no additional tumor for a  total of 7/15 lymph nodes positive. Again this tumor was triple negative. She has gone on to receive her adjuvant chemotherapy with her last dose given 2 weeks ago. He was enrolled on NSABP be-49. She was really doing well and just had a minimal nail bed changes alopecia and some increased lacrimation of her eyes. She was seen in the emergency room last week unfortunately for a clot in her right arm. Her right arm as continue to be swollen and painful. She does wear a sleeve on her left arm and has been treated by a physical therapist for lymphedema. She is ready to proceed on with radiation. I should also states that she has been tested for BRCA was found to be negative.Marland Kitchen  PREVIOUS RADIATION THERAPY: No  PAST MEDICAL HISTORY:  has a past medical history of Breast mass in female; Hyperlipidemia; Nausea; Hypertension; Nausea & vomiting; Cataracts, bilateral; Diabetes mellitus; Breast cancer; S/P chemotherapy, time since 4-12 weeks; History of chemotherapy; Lymphedema of arm; Blood clot in vein; and Allergy.    PAST SURGICAL HISTORY: Past Surgical History  Procedure Date  . Bladder tacking 01/2009  . Shoulder surgery 04/26/10    left  . Tubal ligation 12/1999  . Left mastectomy 02/21/2011  . Portacath placement 02/21/2011    Procedure: INSERTION PORT-A-CATH;  Surgeon: Ernestene Mention, MD;  Location: [facility] OR;  Service: General;  Laterality: N/A;  . Mastectomy modified radical 02/21/11    left,invasive  multifocal grade III ductal ca,high grade dcis,lymph/vascular invasion invasion (7/14) nodes positive mets  . Abdominal hysterectomy 2002    still has ovaries  FAMILY HISTORY: family history includes Cancer in her maternal aunt and maternal grandfather; Cancer (age of onset:50) in her maternal uncle; Deep vein thrombosis in an unspecified family member; Diabetes in an unspecified family member; Hypertension in an unspecified family member; and Kidney disease in an unspecified family member.  SOCIAL  HISTORY:  reports that she has never smoked. She has never used smokeless tobacco. She reports that she does not drink alcohol or use illicit drugs.  ALLERGIES: Hydrocodone-acetaminophen; Insulin glargine; and Nph iletin i  MEDICATIONS:  Current Outpatient Prescriptions  Medication Sig Dispense Refill  . acetaminophen (TYLENOL) 325 MG tablet Take 650 mg by mouth every 6 (six) hours as needed. prn      . furosemide (LASIX) 20 MG tablet Take 20 mg by mouth daily.      Marland Kitchen gabapentin (NEURONTIN) 300 MG capsule Take 600 mg by mouth 3 (three) times daily.      . Insulin Human (INSULIN PUMP) 100 unit/ml SOLN Inject into the skin continuous.      Marland Kitchen lisinopril (PRINIVIL,ZESTRIL) 10 MG tablet Take 10 mg by mouth daily. For blood pressure       . LORazepam (ATIVAN) 0.5 MG tablet Take 1 tablet (0.5 mg total) by mouth every 6 (six) hours as needed (Nausea or vomiting).  30 tablet  0  . oxyCODONE-acetaminophen (TYLOX) 5-500 MG per capsule Take 1 capsule by mouth every 8 (eight) hours as needed. Takes prn occadionally      . Rivaroxaban (XARELTO) 15 MG TABS tablet Take 1 tablet (15 mg total) by mouth 2 (two) times daily.  42 tablet  0  . zolpidem (AMBIEN) 5 MG tablet TAKE ONE TABLET BY MOUTH AT BEDTIME.  30 tablet  0  . dexamethasone (DECADRON) 4 MG tablet Take 2 tablets two times a day the day before Taxotere. Then take 2 tabs two times a day starting the day after chemo for 3 days.  30 tablet  1  . ondansetron (ZOFRAN) 8 MG tablet Take 1 tablet two times a day starting the day after chemo for 3 days. Then take 1 tab two times a day as needed for nausea or vomiting.  30 tablet  1  . prochlorperazine (COMPAZINE) 10 MG tablet Take 1 tablet (10 mg total) by mouth every 6 (six) hours as needed (Nausea or vomiting).  30 tablet  1  . prochlorperazine (COMPAZINE) 25 MG suppository Place 1 suppository (25 mg total) rectally every 12 (twelve) hours as needed for nausea.  12 suppository  3    REVIEW OF SYSTEMS:  A 15  point review of systems is documented in the electronic medical record. This was obtained by the nursing staff. However, I reviewed this with the patient to discuss relevant findings and make appropriate changes.  Pertinent items are noted in HPI.   PHYSICAL EXAM:  weight is 192 lb 3.2 oz (87.181 kg). Her oral temperature is 98.2 F (36.8 C). Her blood pressure is 133/79 and her pulse is 101. Her respiration is 20.   Marland Kitchen She is a pleasant female in no distress sitting comfortably examining table. She is status post mastectomy on the left and wears a lymphedema sleeve. Her right arm is slightly swollen. No palpable abnormalities the right breast. She is alert and oriented x3. She has no palpable cervical supraclavicular or axillary adenopathy bilaterally. Cranial nerves II through XII are tested and intact.  LABORATORY DATA:  Lab Results  Component Value Date   WBC 10.7* 07/24/2011   HGB  9.3* 07/24/2011   HCT 28.5* 07/24/2011   MCV 89.2 07/24/2011   PLT 245 07/24/2011   Lab Results  Component Value Date   NA 137 07/24/2011   K 3.4* 07/24/2011   CL 100 07/24/2011   CO2 26 07/24/2011   Lab Results  Component Value Date   ALT 33 07/24/2011   AST 30 07/24/2011   ALKPHOS 260* 07/24/2011   BILITOT 0.3 07/24/2011     RADIOGRAPHY: Dg Chest 2 View  07/17/2011  *RADIOLOGY REPORT*  Clinical Data: 41 year old female with cough.  Undergoing chemotherapy for breast cancer.  CHEST - 2 VIEW  Comparison: 02/21/2011.  Findings: The cardiomediastinal silhouette is unremarkable. A right subclavian Port-A-Cath is again identified with tip overlying the cavoatrial junction. Left mastectomy noted. There is no evidence of focal airspace disease, pulmonary edema, suspicious pulmonary nodule/mass, pleural effusion, or pneumothorax. No acute bony abnormalities are identified. A mild thoracic scoliosis is unchanged.  IMPRESSION: No evidence of acute cardiopulmonary disease.  Original Report Authenticated By: Rosendo Gros, M.D.     Ct Angio Chest W/cm &/or Wo Cm  07/22/2011  *RADIOLOGY REPORT*  Clinical Data: Right subclavian DVT by report, history of left breast cancer for which the patient underwent mastectomy. Shortness of breath and tachycardia currently.  CT ANGIOGRAPHY CHEST  Technique:  Multidetector CT imaging of the chest using the standard protocol during bolus administration of intravenous contrast. Multiplanar reconstructed images including MIPs were obtained and reviewed to evaluate the vascular anatomy.  Contrast: OMNIPAQUE IOHEXOL 350 MG/ML SOLN  Comparison: PET CT 02/13/2011.  Findings: Contrast opacification of the pulmonary arteries is good. Respiratory motion blurred images of the bases, and lung volumes are low.  Overall, the study is of moderate to good diagnostic quality.  No filling defects within either main pulmonary artery or their branches in either lung to suggest pulmonary embolism.  Heart size upper normal, with a borderline left ventricular enlargement. Right subclavian Port-A-Cath tip in the upper right atrium.  No pericardial effusion.  No visible coronary artery calcification. No visible atherosclerosis involving the thoracic or upper abdominal aorta or their branches.  Direct origin of the left vertebral artery from the aortic arch. On delayed images, the SVC is widely patent.  Pain nonocclusive small filling defect is noted in the right subclavian vein.  Low lung volumes with dependent atelectasis in the lower lobes and in the posterior upper lobes.  Lungs otherwise clear.  No pulmonary parenchymal nodules or masses.  No pleural effusions.  Minimal pleural thickening bilaterally.  Central airways patent without significant bronchial wall thickening.  Thymic tissue in the anterior-superior mediastinum.  Surgical clips left axilla from prior node dissection.  No enlarged lymph nodes and the mediastinum, either hilum, or either axilla.  Left mastectomy without evidence of recurrent tumor in the left  chest wall.  Visualized extreme upper abdomen unremarkable.  Bone window images demonstrate no evidence of osseous metastatic disease.IMPRESSION:  1.  No evidence of pulmonary embolism. 2.  Low lung volumes with atelectasis in the lower lobes and posterior upper lobes.  No acute cardiopulmonary disease otherwise. 3.  No evidence of metastatic disease in the chest. 4.  Nonocclusive clot in the right subclavian vein.  Widely patent SVC.  Original Report Authenticated By: Arnell Sieving, M.D.      IMPRESSION: T2 N2 invasive ductal carcinoma of the left breast status post mastectomy  PLAN: I spoke to the patient today regarding the indications for postmastectomy radiation. We discussed the process of  simulation and the placement tattoos. We discussed 33 treatments as an outpatient. We discussed the improved local control and overall survival seen in randomized patients who undergo postmastectomy radiation in this setting. We discussed the use of breath hold to spare her heart musculature and arteries. We discussed the possible acute effects of treatment including but not limited to skin redness darkness irritation and pain and fatigue. We discussed the increased complication seen in patients who undergo reconstruction after radiation. She has previously met with Dr. Kelly Splinter and is aware that her to reconstruction may be delayed up to 6 months after her treatment is complete. We discussed possible damage to the ribs and lungs. She has signed informed consent and I have given her a copy of this. I discussed with her that given her significant number of positive lymph nodes and triple negative status I would recommend treatment of the supraclavicular fossa and axilla. We discussed this could slightly increase her risk for lymphedema. He is amenable to treatment. I spent 60 minutes  face to face with the patient and more than 50% of that time was spent in counseling and/or coordination of care.     ------------------------------------------------  Lurline Hare, MD

## 2011-08-02 NOTE — Progress Notes (Signed)
Addendum previous note, patient has had 5 masses in breast 9:59 AM

## 2011-08-02 NOTE — Addendum Note (Signed)
Encounter addended by: Agnes Lawrence, RN on: 08/02/2011  6:36 PM<BR>     Documentation filed: Charges VN

## 2011-08-02 NOTE — Progress Notes (Signed)
New consult s/p left modified radical mastectomy, with axillary dissection(7/14) lymph nodes positive for metastatic carcinoma, invasive multifocal grade III ductal carcinoma, T2,N2, triple neg Hx 5 separate cancers Chemotherapy Taxotere/cytoxin with Neulasta support Left upper lymphedema =07/22/11=dx nonocclusive clot right subclavian vein, had Lovenox injection sent home on Xarelto for 6 months   Alert,oriented x3, Married, 3 children, menses age 41, first pregnancy age 20, g61,3 living, (1 abortion, 1 miscarriage) Wearing left sleve for lymphedema, right arm still swollen, no pain in breast, b/l  Lower edema    Allergies:Hydrocodone-Acetaminophen=itching/swelling, Insuline Glargine=itching, nausea,vomiting,swelling, Nph LLetin(insulin Isophane,mixed Nph)=Itching,nausea,vomiting/swelling

## 2011-08-09 ENCOUNTER — Other Ambulatory Visit: Payer: Self-pay | Admitting: *Deleted

## 2011-08-09 DIAGNOSIS — C50912 Malignant neoplasm of unspecified site of left female breast: Secondary | ICD-10-CM

## 2011-08-10 ENCOUNTER — Ambulatory Visit (HOSPITAL_BASED_OUTPATIENT_CLINIC_OR_DEPARTMENT_OTHER): Payer: BC Managed Care – PPO | Admitting: Oncology

## 2011-08-10 ENCOUNTER — Ambulatory Visit
Admission: RE | Admit: 2011-08-10 | Discharge: 2011-08-10 | Disposition: A | Discharge status: Home / Self Care | Payer: BC Managed Care – PPO | Source: Ambulatory Visit | Attending: Radiation Oncology | Admitting: Radiation Oncology

## 2011-08-10 ENCOUNTER — Other Ambulatory Visit: Payer: Self-pay | Admitting: Oncology

## 2011-08-10 ENCOUNTER — Other Ambulatory Visit (HOSPITAL_BASED_OUTPATIENT_CLINIC_OR_DEPARTMENT_OTHER): Payer: BC Managed Care – PPO

## 2011-08-10 ENCOUNTER — Ambulatory Visit (HOSPITAL_COMMUNITY)
Admission: RE | Admit: 2011-08-10 | Discharge: 2011-08-10 | Disposition: A | Discharge status: Home / Self Care | Payer: BC Managed Care – PPO | Source: Ambulatory Visit | Attending: Oncology | Admitting: Oncology

## 2011-08-10 ENCOUNTER — Other Ambulatory Visit: Payer: Self-pay | Admitting: *Deleted

## 2011-08-10 ENCOUNTER — Encounter: Payer: Self-pay | Admitting: *Deleted

## 2011-08-10 VITALS — BP 144/78 | HR 108 | Temp 98.4°F | Ht 65.0 in | Wt 193.4 lb

## 2011-08-10 DIAGNOSIS — I82409 Acute embolism and thrombosis of unspecified deep veins of unspecified lower extremity: Secondary | ICD-10-CM

## 2011-08-10 DIAGNOSIS — C50419 Malignant neoplasm of upper-outer quadrant of unspecified female breast: Secondary | ICD-10-CM | POA: Diagnosis not present

## 2011-08-10 DIAGNOSIS — G609 Hereditary and idiopathic neuropathy, unspecified: Secondary | ICD-10-CM | POA: Diagnosis not present

## 2011-08-10 DIAGNOSIS — C50912 Malignant neoplasm of unspecified site of left female breast: Secondary | ICD-10-CM

## 2011-08-10 DIAGNOSIS — I82B19 Acute embolism and thrombosis of unspecified subclavian vein: Secondary | ICD-10-CM | POA: Insufficient documentation

## 2011-08-10 DIAGNOSIS — Z86718 Personal history of other venous thrombosis and embolism: Secondary | ICD-10-CM | POA: Diagnosis not present

## 2011-08-10 DIAGNOSIS — D702 Other drug-induced agranulocytosis: Secondary | ICD-10-CM

## 2011-08-10 LAB — CBC WITH DIFFERENTIAL/PLATELET
BASO%: 1.4 % (ref 0.0–2.0)
EOS%: 2.2 % (ref 0.0–7.0)
HCT: 30.7 % — ABNORMAL LOW (ref 34.8–46.6)
LYMPH%: 27.6 % (ref 14.0–49.7)
MCH: 29.6 pg (ref 25.1–34.0)
MCHC: 33.6 g/dL (ref 31.5–36.0)
NEUT%: 59.8 % (ref 38.4–76.8)
Platelets: 325 10*3/uL (ref 145–400)
lymph#: 1.2 10*3/uL (ref 0.9–3.3)

## 2011-08-10 LAB — COMPREHENSIVE METABOLIC PANEL
ALT: 14 U/L (ref 0–35)
AST: 15 U/L (ref 0–37)
Alkaline Phosphatase: 145 U/L — ABNORMAL HIGH (ref 39–117)
Creatinine, Ser: 0.62 mg/dL (ref 0.50–1.10)
Total Bilirubin: 0.3 mg/dL (ref 0.3–1.2)

## 2011-08-10 MED ORDER — ENOXAPARIN SODIUM 100 MG/ML ~~LOC~~ SOLN: 100.0000 mg | Freq: Every day | SUBCUTANEOUS | Status: DC

## 2011-08-10 MED ORDER — GABAPENTIN 300 MG PO CAPS: 600.0000 mg | ORAL_CAPSULE | Freq: Three times a day (TID) | ORAL | Status: DC

## 2011-08-10 MED ORDER — FUROSEMIDE 20 MG PO TABS: 20.0000 mg | ORAL_TABLET | Freq: Every day | ORAL | Status: DC

## 2011-08-10 NOTE — Progress Notes (Signed)
Met with patient to discuss RO billing.  Dx: 174.9 Female Breast, NOS (excludes skin of breast T-173.5)  Attending Rad: Dr. Iona Hansen Tx: [phone] Extrl Beam

## 2011-08-10 NOTE — Progress Notes (Signed)
Hematology and Oncology Follow Up Visit  [name] [id number] 1971-01-22 41 y.o. 08/10/2011    HPI: [name] is a 41 year old [place] woman with a newly diagnosed pathologically staged T2, N2 a triple negative left breast carcinoma, s/p left modified radical mastectomy with axillary lymph node dissection on 02/21/2011. She is participating in NSABP B-49, and has been randomized to 6 cycles of every 3 week Taxotere/Cytoxan with Neulasta support on day 2, held with cycle 2.  Currently  completed  cycle 6/6.  2. Insulin pump dependent diabetic.  3. Severe afebrile neutropenia due to Neulasta being held on day 2, normalization of total white blood cell count and ANC, though to be restarted with cycle 3.  4. Excessive eye tearing, secondary to Taxotere-based therapy, improving on lubricating eyedrops.  5. Grade 1 nailbed sensitivity, secondary to Taxotere-based therapy, improvement with utilization of vinegar soaks.  6. Left upper extremity lymphedema, improved as compared to last week.  7. Grade 3 fatigability.  8. Increasing peripheral neuropathy not affecting ADL  Interim History:   [name] is seen today for followup,and she is doing well. Her diabetes is under fairly good control. She continues to have swelling of her right upper extremity. She is on xarelto. It is not appear to be a significant decrease in size of her right upper extremity. She complains of weight gain right and lower M.D. edema. She was on Lasix and is not taking currently. She does not complain of any tearing or neuropathy. Overall performance status is fairly good.  Review of Systems: Constitutional:  Increasing fatigability. Eyes: uses glasses ENT: no complaints Cardiovascular: no chest pain or dyspnea on exertion Respiratory: no cough, shortness of breath, or wheezing Neurological: no TIA or stroke symptoms Dermatological: negative Gastrointestinal: no abdominal pain, change in bowel  habits, or black or bloody stools Genito-Urinary: no dysuria, trouble voiding, or hematuria Hematological and Lymphatic: negative Breast: negative Musculoskeletal: Evidence of left upper extremity edema, improved as compared to last week, compression dressing in place.. Remaining ROS negative.   Medications:   I have reviewed the patient's current medications.  Current Outpatient Prescriptions  Medication Sig Dispense Refill  . acetaminophen (TYLENOL) 325 MG tablet Take 650 mg by mouth every 6 (six) hours as needed. prn      . gabapentin (NEURONTIN) 300 MG capsule Take 600 mg by mouth 3 (three) times daily.      . Insulin Human (INSULIN PUMP) 100 unit/ml SOLN Inject into the skin continuous.      Marland Kitchen lisinopril (PRINIVIL,ZESTRIL) 10 MG tablet Take 10 mg by mouth daily. For blood pressure       . LORazepam (ATIVAN) 0.5 MG tablet Take 1 tablet (0.5 mg total) by mouth every 6 (six) hours as needed (Nausea or vomiting).  30 tablet  0  . oxyCODONE-acetaminophen (TYLOX) 5-500 MG per capsule Take 1 capsule by mouth every 8 (eight) hours as needed. Takes prn occadionally      . Rivaroxaban (XARELTO) 15 MG TABS tablet Take 1 tablet (15 mg total) by mouth 2 (two) times daily.  42 tablet  0  . zolpidem (AMBIEN) 5 MG tablet TAKE ONE TABLET BY MOUTH AT BEDTIME.  30 tablet  0  . DISCONTD: furosemide (LASIX) 20 MG tablet Take 20 mg by mouth daily.      . furosemide (LASIX) 20 MG tablet Take 1 tablet (20 mg total) by mouth daily.  30 tablet  3  . DISCONTD: furosemide (LASIX) 20 MG tablet Take 1 tablet (  20 mg total) by mouth daily.  30 tablet  1  . DISCONTD: furosemide (LASIX) 20 MG tablet Take 20 mg by mouth daily.        Allergies:  Allergies  Allergen Reactions  . Hydrocodone-Acetaminophen Itching and Swelling  . Insulin Glargine Itching, Nausea And Vomiting and Swelling  . Nph Iletin I (Insulin Isophane, Mixed Nph) Itching, Nausea And Vomiting and Swelling    Physical Exam: Filed Vitals:    08/10/11 0917  BP: 144/78  Pulse: 108  Temp: 98.4 F (36.9 C)  Weight: 189 lbs. HEENT:  Sclerae anicteric, conjunctivae pink.  Oropharynx clear.  No mucositis or candidiasis.   Nodes:  No cervical, supraclavicular, or axillary lymphadenopathy palpated.  Breast Exam:  Deferred.   Lungs:  Clear to auscultation bilaterally.  No crackles, rhonchi, or wheezes.   Heart:  Regular rate and rhythm. ABD:  Soft, nontender with normal bowel sounds. Insulin pump in place. Extremities:  Left upper extremity fullness consistent with lymphedema, has improved significantly, compression sleeve in place.  No evidence of palpable venous cords or evidence of streaking, but evidence of bilateral lower extremity 1+ edema.  Good range of motion.  The right upper extremity does show evidence of edema but good range of motion. No Frank erythema.   NEURO: Alert and oriented x3.  Lower extremity muscular strength is intact.  Lab Results: Lab Results  Component Value Date   WBC 4.2 08/10/2011   HGB 10.3* 08/10/2011   HCT 30.7* 08/10/2011   MCV 88.1 08/10/2011   PLT 325 08/10/2011   NEUTROABS 2.5 08/10/2011     Chemistry      Component Value Date/Time   NA 137 07/24/2011 0814   K 3.4* 07/24/2011 0814   CL 100 07/24/2011 0814   CO2 26 07/24/2011 0814   BUN 7 07/24/2011 0814   CREATININE 0.65 07/24/2011 0814      Component Value Date/Time   CALCIUM 9.3 07/24/2011 0814   ALKPHOS 260* 07/24/2011 0814   AST 30 07/24/2011 0814   ALT 33 07/24/2011 0814   BILITOT 0.3 07/24/2011 0814      Assessment:  [name] is a 41 year old [place] woman with a newly diagnosed pathologically staged T2, N2 a triple negative left breast carcinoma, s/p left modified radical mastectomy with axillary lymph node dissection on 02/21/2011. She is participating in NSABP B-49, and has been randomized to 6 cycles of every 3 week Taxotere/Cytoxan with Neulasta support on day 2, held with cycle 2.    2. Insulin pump dependent  diabetic.  3. Severe afebrile neutropenia due to Neulasta being held on day 2, normalization of total white blood cell count and ANC, though to be restarted with cycle 3.  4. Excessive eye tearing, secondary to Taxotere-based therapy, improving on lubricating eyedrops.  5. Grade 1 nailbed sensitivity, secondary to Taxotere-based therapy, improvement with utilization of vinegar soaks.  6. Left upper extremity lymphedema, improved as compared to last week.  7. Grade 3 fatigability.  8. Increasing peripheral neuropathy not affecting ADLs.  9. Newly diagnosed right upper extremity DVT, involvement of the right subclavian vein. On Xarelto 15mg  by mouth twice a day.  Case reviewed with Dr. Pierce Crane.  ECOG: 1  Plan:  Patient is doing fairly well. She continues to have swelling of her right upper extremity. I am sending her back for a repeat Doppler to see if they're still clot present. I think we need to convert her back to Lovenox for the duration. The  plan would be to remove report after completion of radiation. She sees the radiation oncologist today. I would likely not converted to Coumadin. I've refilled her Lasix prescription as well. I've asked her stop as well to and as noted begin on 100 milligrams of Lovenox daily. I plan to see her in a month's time.  Terri Dell   md ,08/10/2011

## 2011-08-10 NOTE — Telephone Encounter (Signed)
gve the pt her aug 2013 appt calendar along with the dopplar appt at [facility] for today

## 2011-08-10 NOTE — Progress Notes (Signed)
08/10/11 3:54pm - The pt was into the cancer center this am with her daughter.  She reports that she is doing well.  She reports some increased energy.  She states she is not doing all of her normal activities yet.  ECOG =1.  She was seen and examined today by Dr. Donnie Coffin.  The pt's weight was up today.  She has gained 12 lbs over the course of her cycle 6 cycles of TC. Grade 1 weight gain.  She has noted swelling to both arms.  The left arm is in a sleeve due to lymphedema.  Her right arm is still swollen from the documented DVT from 07/22/11.  She also has some bilateral lower extremity edema -grade 1 LE edema.  Her nails are still intact.  She has minimal nail changes- grade 1.  She also denies any neuropathy problems.  Her labs were reviewed by Dr. Donnie Coffin.  The pt's glucose was elevated.  She manages her elevations using her pump.  She reports that her "sugars" have been well-controlled for the most part.  Dr. Donnie Coffin ordered an ultrasound to assess the status of her right DVT.  The pt will proceed with radiation.  The pt knows to call if she has any concerns.

## 2011-08-10 NOTE — Progress Notes (Signed)
Name: [name]   MRN: [id number]  Date:  08/10/2011  DOB: January 06, 1971  Status:outpatient    DIAGNOSIS: Breast cancer.  CONSENT VERIFIED: yes   SET UP: Patient is setup supine   IMMOBILIZATION:  The following immobilization was used:Custom Moldable Pillow, breast board.   NARRATIVE: [name] was brought to the CT Simulation planning suite.  Identity was confirmed.  All relevant records and images related to the planned course of therapy were reviewed.  Then, the patient was positioned in a stable reproducible clinical set-up for radiation therapy.  Wires were placed to delineate the clinical extent of breast tissue. A wire was placed on the scar as well.  CT images were obtained.  An isocenter was placed. Skin markings were placed.  The CT images were loaded into the planning software where the target and avoidance structures were contoured.  The radiation prescription was entered and confirmed. The patient was discharged in stable condition and tolerated simulation well.    TREATMENT PLANNING NOTE:  Treatment planning then occurred. I have requested :4 MLC's, isodose plan, basic dose calculation  5 complex treatment devices were used.

## 2011-08-10 NOTE — Progress Notes (Signed)
VASCULAR LAB PRELIMINARY  PRELIMINARY  PRELIMINARY  PRELIMINARY  Right upper extremity venous duplex completed.    Preliminary report:  DVT in the right axillary vein appears resolved.  There is some reconstitution in the subclavian vein but it remains thrombosed.  No other thrombosis noted.  Heena Woodbury, RVT 08/10/2011, 12:50 PM

## 2011-08-17 ENCOUNTER — Ambulatory Visit
Admission: RE | Admit: 2011-08-17 | Discharge: 2011-08-17 | Disposition: A | Discharge status: Home / Self Care | Payer: BC Managed Care – PPO | Source: Ambulatory Visit | Attending: Radiation Oncology | Admitting: Radiation Oncology

## 2011-08-17 DIAGNOSIS — C50912 Malignant neoplasm of unspecified site of left female breast: Secondary | ICD-10-CM

## 2011-08-17 NOTE — Progress Notes (Signed)
  Radiation Oncology         [phone] ________________________________  Name: [name] MRN: [id number]  Date: 08/17/2011  DOB: Dec 20, 1970  Simulation Verification Note  Status: outpatient  NARRATIVE: The patient was brought to the treatment unit and placed in the planned treatment position. The clinical setup was verified. Then port films were obtained and uploaded to the radiation oncology medical record software.  The treatment beams were carefully compared against the planned radiation fields. The position location and shape of the radiation fields was reviewed. The targeted volume of tissue appears appropriately covered by the radiation beams. Organs at risk appear to be excluded as planned.  Based on my personal review, I approved the simulation verification. The patient's treatment will proceed as planned.  ------------------------------------------------  Lurline Hare, MD

## 2011-08-21 ENCOUNTER — Ambulatory Visit
Admission: RE | Admit: 2011-08-21 | Discharge: 2011-08-21 | Disposition: A | Discharge status: Home / Self Care | Payer: BC Managed Care – PPO | Source: Ambulatory Visit | Attending: Radiation Oncology | Admitting: Radiation Oncology

## 2011-08-22 ENCOUNTER — Encounter: Payer: Self-pay | Admitting: Radiation Oncology

## 2011-08-22 ENCOUNTER — Ambulatory Visit
Admission: RE | Admit: 2011-08-22 | Discharge: 2011-08-22 | Disposition: A | Discharge status: Home / Self Care | Payer: BC Managed Care – PPO | Source: Ambulatory Visit | Attending: Radiation Oncology | Admitting: Radiation Oncology

## 2011-08-22 VITALS — HR 91 | Temp 97.0°F | Resp 20 | Wt 190.4 lb

## 2011-08-22 DIAGNOSIS — C50912 Malignant neoplasm of unspecified site of left female breast: Secondary | ICD-10-CM

## 2011-08-22 NOTE — Progress Notes (Signed)
Weekly Management Note Current Dose:  3.6 Gy  Projected Dose: 50.4 Gy   Narrative:  The patient presents for routine under treatment assessment.  CBCT/MVCT images/Port film x-rays were reviewed.  The chart was checked. No complaints. Arm is less swollen. Continues coumadin.  Physical Findings: Weight: 190 lb 6.4 oz (86.365 kg). Unchanged skin over left chest wall.  Impression:  The patient is tolerating radiation.  Plan:  Continue treatment as planned. Continue radiaplex.

## 2011-08-22 NOTE — Progress Notes (Signed)
Patient arrived,alert,oriented x3, no c/o pain, left chest wall skin intact, no skin changes seen, 3nd treatment, post sim teaching, radiaplex gel,alra deodorant, radiation therapy and you book, with leaflet on skin products given to patient, unable to get blood pressure left leg, patient has a blood clot right arm s/p portacath, on Lovenox 100mg  sq daily past 2 weeks, right arm is swollen but swelling has gone down states patient, left arm she is wearing lymphedema sleeve, both feet puffy on lasix daily. Eating and drinking well 8:47 AM

## 2011-08-23 ENCOUNTER — Ambulatory Visit
Admission: RE | Admit: 2011-08-23 | Discharge: 2011-08-23 | Disposition: A | Discharge status: Home / Self Care | Payer: BC Managed Care – PPO | Source: Ambulatory Visit | Attending: Radiation Oncology | Admitting: Radiation Oncology

## 2011-08-24 ENCOUNTER — Ambulatory Visit
Admission: RE | Admit: 2011-08-24 | Discharge: 2011-08-24 | Disposition: A | Discharge status: Home / Self Care | Payer: BC Managed Care – PPO | Source: Ambulatory Visit | Attending: Radiation Oncology | Admitting: Radiation Oncology

## 2011-08-25 ENCOUNTER — Ambulatory Visit
Admission: RE | Admit: 2011-08-25 | Discharge: 2011-08-25 | Disposition: A | Discharge status: Home / Self Care | Payer: BC Managed Care – PPO | Source: Ambulatory Visit | Attending: Radiation Oncology | Admitting: Radiation Oncology

## 2011-08-28 ENCOUNTER — Ambulatory Visit
Admission: RE | Admit: 2011-08-28 | Discharge: 2011-08-28 | Disposition: A | Discharge status: Home / Self Care | Payer: BC Managed Care – PPO | Source: Ambulatory Visit | Attending: Radiation Oncology | Admitting: Radiation Oncology

## 2011-08-29 ENCOUNTER — Encounter: Payer: Self-pay | Admitting: Radiation Oncology

## 2011-08-29 ENCOUNTER — Ambulatory Visit
Admission: RE | Admit: 2011-08-29 | Discharge: 2011-08-29 | Disposition: A | Discharge status: Home / Self Care | Payer: BC Managed Care – PPO | Source: Ambulatory Visit | Attending: Radiation Oncology | Admitting: Radiation Oncology

## 2011-08-29 VITALS — HR 80 | Temp 97.0°F | Resp 20 | Wt 187.1 lb

## 2011-08-29 DIAGNOSIS — C50912 Malignant neoplasm of unspecified site of left female breast: Secondary | ICD-10-CM

## 2011-08-29 NOTE — Progress Notes (Signed)
Patient, alert, oriented , x3, no c/o pain or discomfort, left chest wall with hyperpigmentation skin intact, using radiaplex gel bid, eating and drinking well, patient wearing left arm sleve,  Unable to get b/p.

## 2011-08-29 NOTE — Progress Notes (Signed)
Weekly Management Note Current Dose: 12.6  Gy  Projected Dose: 60.4  Gy   Narrative:  The patient presents for routine under treatment assessment.  CBCT/MVCT images/Port film x-rays were reviewed.  The chart was checked.  She is tolerating the treatments well without any side effects this time. She does have some fatigue but has had this prior to starting her treatment.  Physical Findings: Weight: 187 lb 1.6 oz (84.868 kg). No palpable supraclavicular or axillary adenopathy. The lungs are clear to auscultation patient. The heart has regular rhythm and rate. Examination of the left chest wall area reveals some mild hyperpigmentation changes.  Impression:  The patient is tolerating radiation.  Plan:  Continue treatment as planned. She will continue using radiaplex for her skin. -----------------------------------  Billie Lade, PhD, MD

## 2011-08-30 ENCOUNTER — Ambulatory Visit
Admission: RE | Admit: 2011-08-30 | Discharge: 2011-08-30 | Disposition: A | Discharge status: Home / Self Care | Payer: BC Managed Care – PPO | Source: Ambulatory Visit | Attending: Radiation Oncology | Admitting: Radiation Oncology

## 2011-08-31 ENCOUNTER — Ambulatory Visit
Admission: RE | Admit: 2011-08-31 | Discharge: 2011-08-31 | Disposition: A | Discharge status: Home / Self Care | Payer: BC Managed Care – PPO | Source: Ambulatory Visit | Attending: Radiation Oncology | Admitting: Radiation Oncology

## 2011-09-01 ENCOUNTER — Ambulatory Visit
Admission: RE | Admit: 2011-09-01 | Discharge: 2011-09-01 | Disposition: A | Discharge status: Home / Self Care | Payer: BC Managed Care – PPO | Source: Ambulatory Visit | Attending: Radiation Oncology | Admitting: Radiation Oncology

## 2011-09-04 ENCOUNTER — Ambulatory Visit
Admission: RE | Admit: 2011-09-04 | Discharge: 2011-09-04 | Disposition: A | Discharge status: Home / Self Care | Payer: BC Managed Care – PPO | Source: Ambulatory Visit | Attending: Radiation Oncology | Admitting: Radiation Oncology

## 2011-09-05 ENCOUNTER — Ambulatory Visit
Admission: RE | Admit: 2011-09-05 | Discharge: 2011-09-05 | Disposition: A | Discharge status: Home / Self Care | Payer: BC Managed Care – PPO | Source: Ambulatory Visit | Attending: Radiation Oncology | Admitting: Radiation Oncology

## 2011-09-06 ENCOUNTER — Ambulatory Visit
Admission: RE | Admit: 2011-09-06 | Discharge: 2011-09-06 | Disposition: A | Discharge status: Home / Self Care | Payer: BC Managed Care – PPO | Source: Ambulatory Visit | Attending: Radiation Oncology | Admitting: Radiation Oncology

## 2011-09-06 ENCOUNTER — Telehealth: Payer: Self-pay | Admitting: Oncology

## 2011-09-06 NOTE — Telephone Encounter (Signed)
Due to PR out moved 8/14 appt from PR to NR and changed time to 8:45 am lb/NR. S/w pt she is aware.

## 2011-09-07 ENCOUNTER — Ambulatory Visit
Admission: RE | Admit: 2011-09-07 | Discharge: 2011-09-07 | Disposition: A | Discharge status: Home / Self Care | Payer: BC Managed Care – PPO | Source: Ambulatory Visit | Attending: Radiation Oncology | Admitting: Radiation Oncology

## 2011-09-07 ENCOUNTER — Encounter: Payer: Self-pay | Admitting: Radiation Oncology

## 2011-09-07 VITALS — BP 155/87 | HR 83 | Temp 98.6°F | Resp 20 | Wt 189.3 lb

## 2011-09-07 DIAGNOSIS — C50912 Malignant neoplasm of unspecified site of left female breast: Secondary | ICD-10-CM

## 2011-09-07 NOTE — Progress Notes (Signed)
Weekly Management Note Current Dose: 25.2  Gy  Projected Dose: 60.4 Gy   Narrative:  The patient presents for routine under treatment assessment.  CBCT/MVCT images/Port film x-rays were reviewed.  The chart was checked. Doing well.  Some tightness in her right arm/shoulder. Not using radiaplex regularly.   Physical Findings: Weight: 189 lb 4.8 oz (85.866 kg). Very mild darkness on right chest wall. Lymphedema sleeve in place.   Impression:  The patient is tolerating radiation.  Plan:  Continue treatment as planned. Encouraged her to restart home PT exercises.  If pain worsens, will send back to PT. Encouraged regular radiaplex use.

## 2011-09-07 NOTE — Progress Notes (Signed)
Patient alert,oriented x3, steady gait, left chest wall hyperpigmentation, skin intact, uses radiaplex gel when she remembers, encouraged to bring with her on treatment day and put on right after rad tx and before getting dressed, patient will try, does have occasional sharp pain in left subclavian area which she states"lasts about 5 minutes,then goes away", took b/p under  Knee right leg, patient wearing lymphedema sleve on left arm,b/l legs edema  eating well 8:44 AM

## 2011-09-08 ENCOUNTER — Ambulatory Visit
Admission: RE | Admit: 2011-09-08 | Discharge: 2011-09-08 | Disposition: A | Discharge status: Home / Self Care | Payer: BC Managed Care – PPO | Source: Ambulatory Visit | Attending: Radiation Oncology | Admitting: Radiation Oncology

## 2011-09-11 ENCOUNTER — Ambulatory Visit
Admission: RE | Admit: 2011-09-11 | Discharge: 2011-09-11 | Disposition: A | Discharge status: Home / Self Care | Payer: BC Managed Care – PPO | Source: Ambulatory Visit | Attending: Radiation Oncology | Admitting: Radiation Oncology

## 2011-09-12 ENCOUNTER — Encounter: Payer: Self-pay | Admitting: Radiation Oncology

## 2011-09-12 ENCOUNTER — Ambulatory Visit
Admission: RE | Admit: 2011-09-12 | Discharge: 2011-09-12 | Disposition: A | Discharge status: Home / Self Care | Payer: BC Managed Care – PPO | Source: Ambulatory Visit | Attending: Radiation Oncology | Admitting: Radiation Oncology

## 2011-09-12 VITALS — BP 143/90 | HR 88 | Temp 97.8°F | Resp 20 | Wt 185.5 lb

## 2011-09-12 DIAGNOSIS — C50912 Malignant neoplasm of unspecified site of left female breast: Secondary | ICD-10-CM

## 2011-09-12 NOTE — Progress Notes (Signed)
Pt denies fatigue, loss of appetite. Does c/o "sharp burning intermittent pain" in upper left back near axilla. She has hx shingles in this same area, but states there is no skin change in this area. Applying Radiaplex to left chest wall. Left lymphedema sleeve on.

## 2011-09-12 NOTE — Progress Notes (Signed)
Weekly Management Note Current Dose:  30.6 Gy  Projected Dose: 60.4 Gy   Narrative:  The patient presents for routine under treatment assessment.  CBCT/MVCT images/Port film x-rays were reviewed.  The chart was checked. Doing well. Come occasional brief left chest wall pain.  Physical Findings: Weight: 185 lb 8 oz (84.142 kg). Slightly dark left chest wall  Impression:  The patient is tolerating radiation.  Plan:  Continue treatment as planned. Continue radiaplex.

## 2011-09-13 ENCOUNTER — Encounter: Payer: Self-pay | Admitting: *Deleted

## 2011-09-13 ENCOUNTER — Ambulatory Visit
Admission: RE | Admit: 2011-09-13 | Discharge: 2011-09-13 | Disposition: A | Discharge status: Home / Self Care | Payer: BC Managed Care – PPO | Source: Ambulatory Visit | Attending: Radiation Oncology | Admitting: Radiation Oncology

## 2011-09-13 ENCOUNTER — Encounter: Payer: Self-pay | Admitting: Family

## 2011-09-13 ENCOUNTER — Ambulatory Visit (HOSPITAL_BASED_OUTPATIENT_CLINIC_OR_DEPARTMENT_OTHER): Payer: BC Managed Care – PPO | Admitting: Family

## 2011-09-13 ENCOUNTER — Telehealth: Payer: Self-pay | Admitting: *Deleted

## 2011-09-13 ENCOUNTER — Other Ambulatory Visit (HOSPITAL_BASED_OUTPATIENT_CLINIC_OR_DEPARTMENT_OTHER): Payer: BC Managed Care – PPO | Admitting: Lab

## 2011-09-13 VITALS — BP 139/90 | HR 94 | Temp 97.8°F | Resp 20 | Ht 65.0 in | Wt 185.7 lb

## 2011-09-13 DIAGNOSIS — E119 Type 2 diabetes mellitus without complications: Secondary | ICD-10-CM | POA: Diagnosis not present

## 2011-09-13 DIAGNOSIS — C50419 Malignant neoplasm of upper-outer quadrant of unspecified female breast: Secondary | ICD-10-CM | POA: Diagnosis not present

## 2011-09-13 DIAGNOSIS — C50912 Malignant neoplasm of unspecified site of left female breast: Secondary | ICD-10-CM

## 2011-09-13 DIAGNOSIS — C50919 Malignant neoplasm of unspecified site of unspecified female breast: Secondary | ICD-10-CM

## 2011-09-13 DIAGNOSIS — I890 Lymphedema, not elsewhere classified: Secondary | ICD-10-CM | POA: Diagnosis not present

## 2011-09-13 DIAGNOSIS — G5690 Unspecified mononeuropathy of unspecified upper limb: Secondary | ICD-10-CM

## 2011-09-13 LAB — CBC WITH DIFFERENTIAL/PLATELET
BASO%: 0.6 % (ref 0.0–2.0)
EOS%: 3.9 % (ref 0.0–7.0)
HCT: 35.4 % (ref 34.8–46.6)
LYMPH%: 29.7 % (ref 14.0–49.7)
MCH: 29.2 pg (ref 25.1–34.0)
MCHC: 33.7 g/dL (ref 31.5–36.0)
NEUT%: 54.8 % (ref 38.4–76.8)
Platelets: 199 10*3/uL (ref 145–400)

## 2011-09-13 LAB — COMPREHENSIVE METABOLIC PANEL
ALT: 42 U/L — ABNORMAL HIGH (ref 0–35)
AST: 35 U/L (ref 0–37)
BUN: 12 mg/dL (ref 6–23)
CO2: 28 mEq/L (ref 19–32)
Creatinine, Ser: 0.72 mg/dL (ref 0.50–1.10)
Total Bilirubin: 0.6 mg/dL (ref 0.3–1.2)

## 2011-09-13 MED ORDER — VENLAFAXINE HCL 37.5 MG PO TABS: 37.5000 mg | ORAL_TABLET | Freq: Two times a day (BID) | ORAL | Status: DC

## 2011-09-13 NOTE — Patient Instructions (Signed)
Start Effexor, 1 tab daily x 7 days, then increase to 2 tabs daily.  Return in 2 months with lab prior.

## 2011-09-13 NOTE — Progress Notes (Signed)
09/13/11- at 10:13am- The pt was into the cancer center this am for her radiation treatment and for a follow up visit.  The pt said that she is tolerating her radiation well.  She will complete her radiation on 10/05/11.  The pt states she is feeling overall better since her chemotherapy completion.  She does report increasing hot flashes.  She said that they are bothersome to her both during the day and at night.  Hot flashes - grade 2.  She was prescribed some low-dose Effexor to help relieve her hot flashes.  She also reports on average her blood sugars run around 250.  She states she is bothered with numbness in her hands now.  She states that the numbness is affecting some of ADL's (putting on earrings) -sensory neuropathy-grade 2.  The pt was seen and examined this am by Colman Cater, Dr. Renelda Loma NP.  The pt was advised to start a B-complex vitamin daily for her neuropathy.  The NP felt the neuropathy was associated with the pt's diabetes.  The pt will be seen in 2 months for continued follow-up and to discuss her port removal. Colman Cater will review the pt's labs and call the pt will any new concerns.  The pt was advised to continue her Lovenox by Colman Cater.    09/13/11 at 1:25pm- Rn reviewed the labs with Colman Cater, NP.  The pt's potassium is WNL.  Her alkaline phosphatase was elevated.  The NP noted that it has been "up and down".  Her alkaline phos. was elevated at her screening labs in February.  The research nurse called the pt and informed her that her potassium was normal and her glucose was below 200.

## 2011-09-13 NOTE — Progress Notes (Signed)
Hematology and Oncology Follow Up Visit  [name] [id number] 11/15/1970 41 y.o. 09/13/2011    HPI: [name] is a 41 year old [place] woman with a pathologically staged T2, N2 a triple negative left breast carcinoma, s/p left modified radical mastectomy with axillary lymph node dissection on 02/21/2011. She is participating in NSABP B-49, and was been randomized to 6 cycles of every 3 week Taxotere/Cytoxan with Neulasta support on day 2, held with cycle 2.  Completed  cycle 6/6 07/17/11.  2. Insulin pump dependent diabetic.  3. History of severe afebrile neutropenia due to Neulasta being held on day 2, normalization of total white blood cell count and ANC, restarted with cycle 3.  4. Grade 1 nailbed sensitivity, secondary to Taxotere-based therapy, improvement since completion of chemo.   5. Left upper extremity lymphedema, improved with use of compression garment.   6. Peripheral neuropathy, persistent after the use of Gabapentin.   7. Right upper extremity DVT, involvement of the right subclavian vein.   Interim History:   [name] is seen today for followup,and she is doing well. Diabetes is moderately controlled with insulin pump.  Left upper extremity edema improved with compression sleeve and glove. Right upper extremity from DVT has improved, normal appearance. Continues on Lovenox per Dr. Donnie Coffin.  Takes Lasix, not on potassium supplements. No tearing or neuropathy. Overall performance status is fairly good. No headache or blurred vision. No cough or shortness of breath. No abdominal pain or new bone pain. Bowel and bladder function are normal. Appetite is good, with adequate fluid intake. Remainder of the 10 point  review of systems is negative.  Medications:   I have reviewed the patient's current medications.  Current Outpatient Prescriptions  Medication Sig Dispense Refill  . acetaminophen (TYLENOL) 325 MG tablet Take 650 mg by mouth every 6 (six) hours  as needed. prn      . enoxaparin (LOVENOX) 100 MG/ML injection Inject 1 mL (100 mg total) into the skin daily.  30 Syringe  4  . furosemide (LASIX) 20 MG tablet Take 1 tablet (20 mg total) by mouth daily.  30 tablet  3  . gabapentin (NEURONTIN) 300 MG capsule Take 2 capsules (600 mg total) by mouth 3 (three) times daily.  180 capsule  1  . Insulin Human (INSULIN PUMP) 100 unit/ml SOLN Inject into the skin continuous.      Marland Kitchen lisinopril (PRINIVIL,ZESTRIL) 10 MG tablet Take 10 mg by mouth daily. For blood pressure       . LORazepam (ATIVAN) 0.5 MG tablet Take 1 tablet (0.5 mg total) by mouth every 6 (six) hours as needed (Nausea or vomiting).  30 tablet  0  . oxyCODONE-acetaminophen (TYLOX) 5-500 MG per capsule Take 1 capsule by mouth every 8 (eight) hours as needed. Takes prn occadionally      . Rivaroxaban (XARELTO) 15 MG TABS tablet Take 1 tablet (15 mg total) by mouth 2 (two) times daily.  42 tablet  0  . zolpidem (AMBIEN) 5 MG tablet TAKE ONE TABLET BY MOUTH AT BEDTIME.  30 tablet  0    Allergies:  Allergies  Allergen Reactions  . Hydrocodone-Acetaminophen Itching and Swelling  . Insulin Glargine Itching, Nausea And Vomiting and Swelling  . Nph Iletin I (Insulin Isophane, Mixed Nph) Itching, Nausea And Vomiting and Swelling    Physical Exam: Filed Vitals:   09/13/11 0854  BP: 139/90  Pulse: 94  Temp: 97.8 F (36.6 C)  Resp: 20  Weight: 189 lbs. HEENT:  Sclerae anicteric, conjunctivae pink.  Oropharynx clear.  No mucositis or candidiasis.   Nodes:  No cervical, supraclavicular, or axillary lymphadenopathy palpated.  Breast Exam:  Deferred.   Lungs:  Clear to auscultation bilaterally.  No crackles, rhonchi, or wheezes.   Heart:  Regular rate and rhythm. ABD:  Soft, nontender with normal bowel sounds. Insulin pump in place. Extremities:  Left upper extremity fullness, consistent with lymphedema, has improved significantly, compression sleeve in place.  No evidence of palpable venous  cords or evidence of streaking, but evidence of bilateral lower extremity 1+ edema.  Good range of motion.  The right upper extremity does show evidence of edema but good range of motion.  NEURO: Alert and oriented x3.  Lower extremity muscular strength is intact. BREAST EXAM: In the supine position, with the right arm over the head, the right nipple is everted. No periareolar edema or nipple discharge. No mass in any quadrant or subareolar region. No redness of the skin. No right axillary adenopathy. With the left arm over the head, the left breast is surgically absent. Scar is flat with slight dimpling, lateral aspect. No redness of the skin. No left axillary adenopathy.    Lab Results: Lab Results  Component Value Date   WBC 2.3* 09/13/2011   HGB 11.9 09/13/2011   HCT 35.4 09/13/2011   MCV 86.7 09/13/2011   PLT 199 09/13/2011   NEUTROABS 1.3* 09/13/2011     Chemistry      Component Value Date/Time   NA 136 08/10/2011 0849   K 3.9 08/10/2011 0849   CL 102 08/10/2011 0849   CO2 22 08/10/2011 0849   BUN 14 08/10/2011 0849   CREATININE 0.62 08/10/2011 0849      Component Value Date/Time   CALCIUM 9.3 08/10/2011 0849   ALKPHOS 145* 08/10/2011 0849   AST 15 08/10/2011 0849   ALT 14 08/10/2011 0849   BILITOT 0.3 08/10/2011 0849  CMET drawn today is pending.     Assessment:  [name] is a 41 year old [place] woman with a pathologically staged T2, N2 a triple negative left breast carcinoma, s/p left modified radical mastectomy with axillary lymph node dissection on 02/21/2011. She is participating in NSABP B-49, and was randomized to 6 cycles of every 3 week Taxotere/Cytoxan with Neulasta support on day 2, held with cycle 2.  Completed chemo 07/17/11.   2. Insulin pump dependent diabetic, moderately controlled. .  3. Grade 1 nailbed sensitivity, secondary to Taxotere-based therapy, improvement with completion of chemo.  4. Bilateral upper extremity peripheral neuropathy.   5. Left  upper extremity lymphedema, improved with compression sleeve and glove.   6. Persistent upper extremity peripheral neuropathy.  7. On radiation with good tolerance. Has completed 15 treatments.   8. Severe and frequent hot flashes, present since chemotherapy.   ECOG 0  Plan:  1. Remove port after completion of radiation.  2. Per Dr. Donnie Coffin, continue 100 milligrams of Lovenox daily. 3. Prescription for Effexor 37.5 mg bid for hot flashes.  4. Mammo on right breast in Dec. 2013.  5. Return in 2 months to see me or Dr. Donnie Coffin to check effectiveness of Effexor and arrange for port removal.    Colman Cater   FNP ,09/13/2011

## 2011-09-13 NOTE — Telephone Encounter (Signed)
Gave patient appointment for 11-09-2011 starting at 9:30am with labs

## 2011-09-14 ENCOUNTER — Ambulatory Visit
Admission: RE | Admit: 2011-09-14 | Discharge: 2011-09-14 | Disposition: A | Discharge status: Home / Self Care | Payer: BC Managed Care – PPO | Source: Ambulatory Visit | Attending: Radiation Oncology | Admitting: Radiation Oncology

## 2011-09-15 ENCOUNTER — Ambulatory Visit
Admission: RE | Admit: 2011-09-15 | Discharge: 2011-09-15 | Disposition: A | Discharge status: Home / Self Care | Payer: BC Managed Care – PPO | Source: Ambulatory Visit | Attending: Radiation Oncology | Admitting: Radiation Oncology

## 2011-09-18 ENCOUNTER — Ambulatory Visit
Admission: RE | Admit: 2011-09-18 | Discharge: 2011-09-18 | Disposition: A | Discharge status: Home / Self Care | Payer: BC Managed Care – PPO | Source: Ambulatory Visit | Attending: Radiation Oncology | Admitting: Radiation Oncology

## 2011-09-18 DIAGNOSIS — I10 Essential (primary) hypertension: Secondary | ICD-10-CM | POA: Diagnosis not present

## 2011-09-18 DIAGNOSIS — E1065 Type 1 diabetes mellitus with hyperglycemia: Secondary | ICD-10-CM | POA: Diagnosis not present

## 2011-09-19 ENCOUNTER — Encounter: Payer: Self-pay | Admitting: Oncology

## 2011-09-19 ENCOUNTER — Ambulatory Visit
Admission: RE | Admit: 2011-09-19 | Discharge: 2011-09-19 | Disposition: A | Discharge status: Home / Self Care | Payer: BC Managed Care – PPO | Source: Ambulatory Visit | Attending: Radiation Oncology | Admitting: Radiation Oncology

## 2011-09-19 DIAGNOSIS — C50919 Malignant neoplasm of unspecified site of unspecified female breast: Secondary | ICD-10-CM

## 2011-09-19 MED ORDER — BIAFINE EX EMUL
Freq: Every day | CUTANEOUS | Status: DC
  Administered 2011-09-19: 14:00:00 via TOPICAL

## 2011-09-19 NOTE — Progress Notes (Signed)
Put cancer care form on nurse's desk.

## 2011-09-19 NOTE — Progress Notes (Signed)
Weekly Management Note Current Dose:  39.6 Gy  Projected Dose: 50.4 Gy   Narrative:  The patient presents for routine under treatment assessment.  CBCT/MVCT images/Port film x-rays were reviewed.  The chart was checked. Doing well. No complaints. Saw on machine for electron beam mark out.   Physical Findings: dark chest wall. Skin is intact. irritation under axilla but she is numb here.   Impression:  The patient is tolerating radiation.  Plan:  Continue treatment as planned. Continue radiaplex.

## 2011-09-20 ENCOUNTER — Ambulatory Visit
Admission: RE | Admit: 2011-09-20 | Discharge: 2011-09-20 | Disposition: A | Discharge status: Home / Self Care | Payer: BC Managed Care – PPO | Source: Ambulatory Visit | Attending: Radiation Oncology | Admitting: Radiation Oncology

## 2011-09-21 ENCOUNTER — Ambulatory Visit
Admission: RE | Admit: 2011-09-21 | Discharge: 2011-09-21 | Disposition: A | Discharge status: Home / Self Care | Payer: BC Managed Care – PPO | Source: Ambulatory Visit | Attending: Radiation Oncology | Admitting: Radiation Oncology

## 2011-09-21 ENCOUNTER — Encounter: Payer: Self-pay | Admitting: Oncology

## 2011-09-21 NOTE — Progress Notes (Unsigned)
Put [facility] form in registration desk. °

## 2011-09-22 ENCOUNTER — Ambulatory Visit
Admission: RE | Admit: 2011-09-22 | Discharge: 2011-09-22 | Disposition: A | Discharge status: Home / Self Care | Payer: BC Managed Care – PPO | Source: Ambulatory Visit | Attending: Radiation Oncology | Admitting: Radiation Oncology

## 2011-09-25 ENCOUNTER — Ambulatory Visit
Admission: RE | Admit: 2011-09-25 | Discharge: 2011-09-25 | Disposition: A | Discharge status: Home / Self Care | Payer: BC Managed Care – PPO | Source: Ambulatory Visit | Attending: Radiation Oncology | Admitting: Radiation Oncology

## 2011-09-26 ENCOUNTER — Ambulatory Visit
Admission: RE | Admit: 2011-09-26 | Discharge: 2011-09-26 | Disposition: A | Discharge status: Home / Self Care | Payer: BC Managed Care – PPO | Source: Ambulatory Visit | Attending: Radiation Oncology | Admitting: Radiation Oncology

## 2011-09-26 ENCOUNTER — Ambulatory Visit: Payer: BC Managed Care – PPO | Admitting: Radiation Oncology

## 2011-09-26 ENCOUNTER — Encounter: Payer: Self-pay | Admitting: Radiation Oncology

## 2011-09-26 VITALS — BP 126/85 | HR 78 | Temp 97.7°F | Resp 20 | Wt 178.0 lb

## 2011-09-26 DIAGNOSIS — C50912 Malignant neoplasm of unspecified site of left female breast: Secondary | ICD-10-CM

## 2011-09-26 NOTE — Progress Notes (Signed)
Patient alert, oriented,x3, no c/o pain, left breast completed 13/14 rad txs, tyo start boost  After that dark , hyperpigmentation on left chest wall, under arm has peeled,dry, no moistness,numbness there, doesn't hurt at all, states patient, using biafine cream bid,  Eating and drinking well, energy  Feels pretty good, effexor helping some with her hot flashes stated 8:35 AM

## 2011-09-26 NOTE — Progress Notes (Signed)
Weekly Management Note Current Dose:48.6   Gy  Projected Dose: 60.4 Gy   Narrative:  The patient presents for routine under treatment assessment.  CBCT/MVCT images/Port film x-rays were reviewed.  The chart was checked. Doing well. No complaints. Some peeling skin with dry desquamation in her axilla but no pain.   Physical Findings: Weight: 178 lb (80.74 kg). Dark skin over left chest wall and sclv fossa.  Impression:  The patient is tolerating radiation.  Plan:  Continue treatment as planned. Continue radiaplex. Starts boost Thursday.

## 2011-09-26 NOTE — Progress Notes (Signed)
Name: [name]   MRN: [id number]  Date:  09/26/2011   DOB: 1970/05/11  Status:outpatient    DIAGNOSIS: Breast cancer.  CONSENT VERIFIED: yes   SET UP: Patient is setup supine   IMMOBILIZATION:  The following immobilization was used:Custom Moldable Pillow, breast board.   NARRATIVE: [name] underwent complex simulation and treatment planning for her boost treatment today.  Her tumor volume was outlined on the planning CT scan. The depth of her cavity was 1.1 Cm.    6  MeV electrons will be prescribed to the 90% Isodose line. 0.5 cm of bolus will be used daily.   A block will be used for beam modification purposes.  A special port plan is requested.

## 2011-09-27 ENCOUNTER — Ambulatory Visit
Admission: RE | Admit: 2011-09-27 | Discharge: 2011-09-27 | Disposition: A | Discharge status: Home / Self Care | Payer: BC Managed Care – PPO | Source: Ambulatory Visit | Attending: Radiation Oncology | Admitting: Radiation Oncology

## 2011-09-28 ENCOUNTER — Ambulatory Visit
Admission: RE | Admit: 2011-09-28 | Discharge: 2011-09-28 | Disposition: A | Discharge status: Home / Self Care | Payer: BC Managed Care – PPO | Source: Ambulatory Visit | Attending: Radiation Oncology | Admitting: Radiation Oncology

## 2011-09-29 ENCOUNTER — Ambulatory Visit
Admission: RE | Admit: 2011-09-29 | Discharge: 2011-09-29 | Disposition: A | Discharge status: Home / Self Care | Payer: BC Managed Care – PPO | Source: Ambulatory Visit | Attending: Radiation Oncology | Admitting: Radiation Oncology

## 2011-10-03 ENCOUNTER — Encounter: Payer: Self-pay | Admitting: Radiation Oncology

## 2011-10-03 ENCOUNTER — Ambulatory Visit
Admission: RE | Admit: 2011-10-03 | Discharge: 2011-10-03 | Disposition: A | Discharge status: Home / Self Care | Payer: BC Managed Care – PPO | Source: Ambulatory Visit | Attending: Radiation Oncology | Admitting: Radiation Oncology

## 2011-10-03 VITALS — BP 115/73 | HR 81 | Temp 98.1°F | Resp 20 | Wt 182.8 lb

## 2011-10-03 DIAGNOSIS — C50919 Malignant neoplasm of unspecified site of unspecified female breast: Secondary | ICD-10-CM

## 2011-10-03 MED ORDER — BIAFINE EX EMUL
CUTANEOUS | Status: DC | PRN
  Administered 2011-10-03: 09:00:00 via TOPICAL

## 2011-10-03 NOTE — Progress Notes (Signed)
Patient alert,oriented x3, compelted weekly rad tx left chest wall  On boost 3/5 , tanning, dry, peeled , on left neck and left chest wall, no c/o itching, or pain, gave another biafine cream per request, also gave [facility] card  To patient, ne support group this sept. Patient informed 8 week progeam on Monday nights 6-8,  8:37 AM

## 2011-10-03 NOTE — Progress Notes (Signed)
Weekly Management Note Current Dose: 56.4  Gy  Projected Dose: 60.4 Gy   Narrative:  The patient presents for routine under treatment assessment.  CBCT/MVCT images/Port film x-rays were reviewed.  The chart was checked. Doing well. No complaints. Some peeling of the skin. Using radiaplex.  Physical Findings: Weight: 182 lb 12.8 oz (82.918 kg). Dry desquamation with peeling over chest wall and SCLV fossa.  Impression:  The patient is tolerating radiation.  Plan:  Continue treatment as planned. Continue radiaplex x 2 weeks then switch to lotion with vit e.

## 2011-10-04 ENCOUNTER — Ambulatory Visit
Admission: RE | Admit: 2011-10-04 | Discharge: 2011-10-04 | Disposition: A | Discharge status: Home / Self Care | Payer: BC Managed Care – PPO | Source: Ambulatory Visit | Attending: Radiation Oncology | Admitting: Radiation Oncology

## 2011-10-05 ENCOUNTER — Ambulatory Visit
Admission: RE | Admit: 2011-10-05 | Discharge: 2011-10-05 | Disposition: A | Discharge status: Home / Self Care | Payer: BC Managed Care – PPO | Source: Ambulatory Visit | Attending: Radiation Oncology | Admitting: Radiation Oncology

## 2011-10-05 ENCOUNTER — Encounter: Payer: Self-pay | Admitting: Radiation Oncology

## 2011-10-09 NOTE — Progress Notes (Signed)
  Radiation Oncology         [phone] ________________________________  Name: [name] MRN: [id number]  Date: 10/05/2011  DOB: Feb 15, 1970  End of Treatment Note  Diagnosis: The encounter diagnosis was Cancer of left breast  Indication for treatment:  Curative      Radiation treatment dates:   08/21/2011-10/05/2011  Site/dose:    Left chest wall / 50.4 Gray @ 1.8 Wallace Cullens per fraction x 28 fractions Left Supraclavicular fossa / 45 Gray @1 .8 Wallace Cullens per fraction x 25 fractions Left PAB / 45 Gray @1 .8 Gray per fraction x 25 fractions Left scar / 10 Gray at [employer] per fraction x 5 fractions  Beams/energy:  Opposed Tangents / 6 MV photons Right anterior oblique / 6 MV photons Left posterior oblique / 6 MV photons En face / 6 MeV electrons  Narrative: The patient tolerated radiation treatment relatively well.   She had the expected dry desquamation.   Plan: The patient has completed radiation treatment. The patient will return to radiation oncology clinic for routine followup in one month. I advised them to call or return sooner if they have any questions or concerns related to their recovery or treatment.  ------------------------------------------------  Lurline Hare, MD

## 2011-10-10 ENCOUNTER — Encounter (INDEPENDENT_AMBULATORY_CARE_PROVIDER_SITE_OTHER): Payer: Self-pay | Admitting: General Surgery

## 2011-10-10 ENCOUNTER — Ambulatory Visit (INDEPENDENT_AMBULATORY_CARE_PROVIDER_SITE_OTHER): Payer: BC Managed Care – PPO | Admitting: General Surgery

## 2011-10-10 VITALS — BP 124/86 | HR 68 | Temp 97.1°F | Resp 14 | Ht 65.0 in | Wt 178.4 lb

## 2011-10-10 DIAGNOSIS — C50919 Malignant neoplasm of unspecified site of unspecified female breast: Secondary | ICD-10-CM | POA: Diagnosis not present

## 2011-10-10 DIAGNOSIS — C50912 Malignant neoplasm of unspecified site of left female breast: Secondary | ICD-10-CM

## 2011-10-10 NOTE — Progress Notes (Signed)
Patient ID: [name], female   DOB: March 25, 1970, 41 y.o.   MRN: [id number]  Chief Complaint  Patient presents with  . Routine Post Op    Mastectomy 02/21/11    HPI [name] is a 41 y.o. female.  She returns for followup regarding her left breast cancer and to schedule removal of her Port-A-Cath.  This patient underwent left modified radical mastectomy and Port-A-Cath placement on 02/21/2011. Final pathology showed triple negative breast cancer, multifocal disease, 4 separate foci, the largest being 2.4 cm. Margins were negative. 7/16 lymph nodes were positive.  Pathologic staging T2, N2a.  She received adjuvant chemotherapy on NSABP B-42 protocol, the last cycle was completed 07/17/2011. She then underwent adjuvant chest wall radiation and that was completed last week.  During her therapy she developed DVT of the right upper extremity, has been on daily Lovenox and swelling in her right upper extremity has resolved. She also has lymphedema of the left upper extremity and wears lymphedema sleave. .  She states she is feeling good. She wants to have her Port-A-Cath out as soon as possible, which makes good sense since  the Port-A-Cath is on the right side and the right upper extremity had a DVT.  She has no specific complaints about anything else. Has been complaints about the left mastectomy site or right breast.  Notes from the cancer center indicate that the Port-A-Cath can be removed after radiation therapy completed. HPI  Past Medical History  Diagnosis Date  . Breast mass in female     fibrocystic changes  . Hyperlipidemia   . Nausea   . Hypertension   . Nausea & vomiting     for approx. 2 weeks  . Cataracts, bilateral   . Diabetes mellitus     insulin dependant - uses pump, Dr. Lucianne [name]  . Breast cancer     T2N2 tiple negative left breast ca   . S/P chemotherapy, time since 4-12 weeks   . History of chemotherapy     taxotere/cytoxan with neulasta  support  . Lymphedema of arm     left upper extremity  . Blood clot in vein     nonocclusive clot right subclavian vein, on Xarelto for 6 months  . Allergy     Past Surgical History  Procedure Date  . Bladder tacking 01/2009  . Shoulder surgery 04/26/10    left  . Tubal ligation 12/1999  . Left mastectomy 02/21/2011  . Portacath placement 02/21/2011    Procedure: INSERTION PORT-A-CATH;  Surgeon: Ernestene Mention, MD;  Location: [facility] OR;  Service: General;  Laterality: N/A;  . Mastectomy modified radical 02/21/11    left,invasive  multifocal grade III ductal ca,high grade dcis,lymph/vascular invasion invasion (7/14) nodes positive mets  . Abdominal hysterectomy 2002    still has ovaries     Family History  Problem Relation Age of Onset  . Hypertension    . Diabetes    . Deep vein thrombosis    . Kidney disease    . Cancer Maternal Aunt     breast  . Cancer Maternal Uncle 50     right eye removed s/p cancer  . Cancer Maternal Grandfather     prostate ca    Social History History  Substance Use Topics  . Smoking status: Never Smoker   . Smokeless tobacco: Never Used  . Alcohol Use: No    Allergies  Allergen Reactions  . Hydrocodone-Acetaminophen Itching and Swelling  . Insulin Glargine Itching,  Nausea And Vomiting and Swelling  . Nph Iletin I (Insulin Isophane, Mixed Nph) Itching, Nausea And Vomiting and Swelling    Current Outpatient Prescriptions  Medication Sig Dispense Refill  . acetaminophen (TYLENOL) 325 MG tablet Take 650 mg by mouth every 6 (six) hours as needed. prn      . emollient (BIAFINE) cream Apply 1 application topically 2 (two) times daily.      Marland Kitchen enoxaparin (LOVENOX) 100 MG/ML injection Inject 1 mL (100 mg total) into the skin daily.  30 Syringe  4  . furosemide (LASIX) 20 MG tablet Take 20 mg by mouth daily as needed. Per patient prn daily 09/26/11      . gabapentin (NEURONTIN) 300 MG capsule Take 2 capsules (600 mg total) by mouth 3 (three) times  daily.  180 capsule  1  . Insulin Human (INSULIN PUMP) 100 unit/ml SOLN Inject into the skin continuous.      Marland Kitchen lisinopril (PRINIVIL,ZESTRIL) 10 MG tablet Take 10 mg by mouth daily. For blood pressure       . oxyCODONE-acetaminophen (TYLOX) 5-500 MG per capsule Take 1 capsule by mouth every 8 (eight) hours as needed. Takes prn occadionally      . venlafaxine (EFFEXOR) 37.5 MG tablet Take 1 tablet (37.5 mg total) by mouth 2 (two) times daily.  60 tablet  3  . zolpidem (AMBIEN) 5 MG tablet TAKE ONE TABLET BY MOUTH AT BEDTIME.  30 tablet  0    Review of Systems Review of Systems  Constitutional: Negative for fever, chills and unexpected weight change.  HENT: Negative for hearing loss, congestion, sore throat, trouble swallowing and voice change.   Eyes: Negative for visual disturbance.  Respiratory: Negative for cough and wheezing.   Cardiovascular: Negative for chest pain, palpitations and leg swelling.  Gastrointestinal: Negative for nausea, vomiting, abdominal pain, diarrhea, constipation, blood in stool, abdominal distention and anal bleeding.  Genitourinary: Negative for hematuria, vaginal bleeding and difficulty urinating.  Musculoskeletal: Negative for arthralgias.  Skin: Negative for rash and wound.  Neurological: Negative for seizures, syncope and headaches.  Hematological: Negative for adenopathy. Does not bruise/bleed easily.  Psychiatric/Behavioral: Negative for confusion.    Blood pressure 124/86, pulse 68, temperature 97.1 F (36.2 C), temperature source Temporal, resp. rate 14, height 5\' 5"  (1.651 m), weight 178 lb 6 oz (80.91 kg).  Physical Exam Physical Exam  Constitutional: She is oriented to person, place, and time. She appears well-developed and well-nourished. No distress.  HENT:  Head: Normocephalic and atraumatic.  Nose: Nose normal.  Mouth/Throat: No oropharyngeal exudate.  Neck: Neck supple. No JVD present. No tracheal deviation present. No thyromegaly present.   Cardiovascular: Normal rate, regular rhythm, normal heart sounds and intact distal pulses.   No murmur heard. Pulmonary/Chest: Effort normal and breath sounds normal. No respiratory distress. She has no wheezes. She has no rales. She exhibits no tenderness.       Left mastectomy wound healed. Recent radiation changes but no complications. Right breast reveals no skin changes, no mass, no axillary adenopathy. Port-A-Cath site right infraclavicular area looks fine.  Musculoskeletal: She exhibits no edema and no tenderness.  Lymphadenopathy:    She has no cervical adenopathy.  Neurological: She is alert and oriented to person, place, and time. She exhibits normal muscle tone. Coordination normal.  Skin: Skin is warm. No rash noted. She is not diaphoretic. No erythema. No pallor.  Psychiatric: She has a normal mood and affect. Her behavior is normal. Judgment and thought content normal.  Data Reviewed Cancer center notes.  Assessment    Multifocal invasive cancer left breast, triple negative, pathologic stage T2, N2a.  No evidence of recurrence 8 months following left modified radical mastectomy and Port-A-Cath placement.  Left upper extremity lymphedema, controlled with sleeve, minimally symptomatic  Right upper extremity DVT, likely related to Port-A-Cath catheter.  On lovinox.   Prompt removal of port advised and desired by patient.  Insulin-dependent diabetes mellitus  Hypertension    Plan    Will schedule for Port-A-Cath removal under local anesthesia. Patient did not desire sedation. I discussed the indications, details, and risks with the patient. She understands these issues, her questions were answered, and she agrees with this plan.  She will stop her Lovenox the day before surgery and the day of surgery and resume on postop day 1.  Right breast mammogram in December 2013  I will see her back in January or February.       Angelia Mould. Derrell Lolling, M.D., [facility] General and Minimally invasive Surgery Breast and Colorectal Surgery Office:   [phone] Pager:   [phone]  10/10/2011, 8:54 AM

## 2011-10-10 NOTE — Patient Instructions (Signed)
Examination of your breast, chest wall, and lymph node areas is normal today. There is no evidence of cancer  Be sure to get your right breast mammogram in December 2013.  You will be scheduled for surgery to remove your Port-A-Cath under local anesthesia.  Do not take your Lovenox dose the day before surgery or the day of surgery. We will plan to resume your Lovenox injections the day after your surgery to remove your Port-A-Cath.

## 2011-10-11 NOTE — H&P (Signed)
[name]   t  MRN: [id number]   Description: 41 year old female  Provider: Ernestene Mention, MD  Department: [facility]      Diagnoses     Cancer of left breast   - Primary    174.9        Vitals -    BP Pulse Temp Resp Ht Wt    124/86 68 97.1 F (36.2 C) (Temporal) 14 5\' 5"  (1.651 m) 178 lb 6 oz (80.91 kg)    BMI - 29.68 kg/m2                   History and Physical     Ernestene Mention, MD   Patient ID: [name], female   DOB: 21-Mar-1970, 41 y.o.   MRN: [id number]            HPI [name] is a 41 y.o. female.  She returns for followup regarding her left breast cancer and to schedule removal of her Port-A-Cath.  This patient underwent left modified radical mastectomy and Port-A-Cath placement on 02/21/2011. Final pathology showed triple negative breast cancer, multifocal disease, 4 separate foci, the largest being 2.4 cm. Margins were negative. 7/16 lymph nodes were positive.  Pathologic staging T2, N2a.  She received adjuvant chemotherapy on NSABP B-42 protocol, the last cycle was completed 07/17/2011. She then underwent adjuvant chest wall radiation and that was completed last week.  During her therapy she developed DVT of the right upper extremity, has been on daily Lovenox and swelling in her right upper extremity has resolved. She also has lymphedema of the left upper extremity and wears lymphedema sleave. .  She states she is feeling good. She wants to have her Port-A-Cath out as soon as possible, which makes good sense since  the Port-A-Cath is on the right side and the right upper extremity had a DVT.  She has no specific complaints about anything else. Has been complaints about the left mastectomy site or right breast.  Notes from the cancer center indicate that the Port-A-Cath can be removed after radiation therapy completed.      Past Medical History   Diagnosis  Date   .  Breast mass in female       fibrocystic changes   .  Hyperlipidemia     .  Nausea     .  Hypertension     .  Nausea & vomiting         for approx. 2 weeks   .  Cataracts, bilateral     .  Diabetes mellitus         insulin dependant - uses pump, Dr. Lucianne [name]   .  Breast cancer         T2N2 tiple negative left breast ca    .  S/P chemotherapy, time since 4-12 weeks     .  History of chemotherapy         taxotere/cytoxan with neulasta support   .  Lymphedema of arm         left upper extremity   .  Blood clot in vein         nonocclusive clot right subclavian vein, on Xarelto for 6 months   .  Allergy         Past Surgical History   Procedure  Date   .  Bladder tacking  01/2009   .  Shoulder surgery  04/26/10  left   .  Tubal ligation  12/1999   .  Left mastectomy  02/21/2011   .  Portacath placement  02/21/2011       Procedure: INSERTION PORT-A-CATH;  Surgeon: Ernestene Mention, MD;  Location: [facility] OR;  Service: General;  Laterality: N/A;   .  Mastectomy modified radical  02/21/11       left,invasive  multifocal grade III ductal ca,high grade dcis,lymph/vascular invasion invasion (7/14) nodes positive mets   .  Abdominal hysterectomy  2002       still has ovaries        Family History   Problem  Relation  Age of Onset   .  Hypertension       .  Diabetes       .  Deep vein thrombosis       .  Kidney disease       .  Cancer  Maternal Aunt         breast   .  Cancer  Maternal Uncle  50        right eye removed s/p cancer   .  Cancer  Maternal Grandfather         prostate ca      Social History History   Substance Use Topics   .  Smoking status:  Never Smoker    .  Smokeless tobacco:  Never Used   .  Alcohol Use:  No       Allergies   Allergen  Reactions   .  Hydrocodone-Acetaminophen  Itching and Swelling   .  Insulin Glargine  Itching, Nausea And Vomiting and Swelling   .  Nph Iletin I (Insulin Isophane, Mixed Nph)  Itching, Nausea And Vomiting and Swelling       Current Outpatient  Prescriptions   Medication  Sig  Dispense  Refill   .  acetaminophen (TYLENOL) 325 MG tablet  Take 650 mg by mouth every 6 (six) hours as needed. prn         .  emollient (BIAFINE) cream  Apply 1 application topically 2 (two) times daily.         Marland Kitchen  enoxaparin (LOVENOX) 100 MG/ML injection  Inject 1 mL (100 mg total) into the skin daily.   30 Syringe   4   .  furosemide (LASIX) 20 MG tablet  Take 20 mg by mouth daily as needed. Per patient prn daily 09/26/11         .  gabapentin (NEURONTIN) 300 MG capsule  Take 2 capsules (600 mg total) by mouth 3 (three) times daily.   180 capsule   1   .  Insulin Human (INSULIN PUMP) 100 unit/ml SOLN  Inject into the skin continuous.         Marland Kitchen  lisinopril (PRINIVIL,ZESTRIL) 10 MG tablet  Take 10 mg by mouth daily. For blood pressure          .  oxyCODONE-acetaminophen (TYLOX) 5-500 MG per capsule  Take 1 capsule by mouth every 8 (eight) hours as needed. Takes prn occadionally         .  venlafaxine (EFFEXOR) 37.5 MG tablet  Take 1 tablet (37.5 mg total) by mouth 2 (two) times daily.   60 tablet   3   .  zolpidem (AMBIEN) 5 MG tablet  TAKE ONE TABLET BY MOUTH AT BEDTIME.   30 tablet   0      Review of Systems  Constitutional: Negative for fever, chills and unexpected weight change.  HENT: Negative for hearing loss, congestion, sore throat, trouble swallowing and voice change.   Eyes: Negative for visual disturbance.  Respiratory: Negative for cough and wheezing.   Cardiovascular: Negative for chest pain, palpitations and leg swelling.  Gastrointestinal: Negative for nausea, vomiting, abdominal pain, diarrhea, constipation, blood in stool, abdominal distention and anal bleeding.  Genitourinary: Negative for hematuria, vaginal bleeding and difficulty urinating.  Musculoskeletal: Negative for arthralgias.  Skin: Negative for rash and wound.  Neurological: Negative for seizures, syncope and headaches.  Hematological: Negative for adenopathy. Does not  bruise/bleed easily.  Psychiatric/Behavioral: Negative for confusion.    Blood pressure 124/86, pulse 68, temperature 97.1 F (36.2 C), temperature source Temporal, resp. rate 14, height 5\' 5"  (1.651 m), weight 178 lb 6 oz (80.91 kg).   Physical Exam   Constitutional: She is oriented to person, place, and time. She appears well-developed and well-nourished. No distress.  HENT:   Head: Normocephalic and atraumatic.   Nose: Nose normal.   Mouth/Throat: No oropharyngeal exudate.  Neck: Neck supple. No JVD present. No tracheal deviation present. No thyromegaly present.  Cardiovascular: Normal rate, regular rhythm, normal heart sounds and intact distal pulses.    No murmur heard. Pulmonary/Chest: Effort normal and breath sounds normal. No respiratory distress. She has no wheezes. She has no rales. She exhibits no tenderness.       Left mastectomy wound healed. Recent radiation changes but no complications. Right breast reveals no skin changes, no mass, no axillary adenopathy. Port-A-Cath site right infraclavicular area looks fine.  Musculoskeletal: She exhibits no edema and no tenderness.  Lymphadenopathy:    She has no cervical adenopathy.  Neurological: She is alert and oriented to person, place, and time. She exhibits normal muscle tone. Coordination normal.  Skin: Skin is warm. No rash noted. She is not diaphoretic. No erythema. No pallor.  Psychiatric: She has a normal mood and affect. Her behavior is normal. Judgment and thought content normal.    Data Reviewed Cancer center notes.   Assessment Multifocal invasive cancer left breast, triple negative, pathologic stage T2, N2a.  No evidence of recurrence 8 months following left modified radical mastectomy and Port-A-Cath placement.  Left upper extremity lymphedema, controlled with sleeve, minimally symptomatic  Right upper extremity DVT, likely related to Port-A-Cath catheter.  On lovinox.   Prompt removal of port advised and  desired by patient.  Insulin-dependent diabetes mellitus  Hypertension   Plan Will schedule for Port-A-Cath removal under local anesthesia. Patient did not desire sedation. I discussed the indications, details, and risks with the patient. She understands these issues, her questions were answered, and she agrees with this plan.  She will stop her Lovenox the day before surgery and the day of surgery and resume on postop day 1.  Right breast mammogram in December 2013  I will see her back in January or February.       Angelia Mould. Derrell Lolling, M.D., [facility] General and Minimally invasive Surgery Breast and Colorectal Surgery Office:   [phone] Pager:   [phone]

## 2011-10-13 ENCOUNTER — Encounter (HOSPITAL_BASED_OUTPATIENT_CLINIC_OR_DEPARTMENT_OTHER)
Admission: RE | Disposition: A | Discharge status: Home / Self Care | Payer: Self-pay | Source: Ambulatory Visit | Attending: General Surgery

## 2011-10-13 ENCOUNTER — Ambulatory Visit (HOSPITAL_BASED_OUTPATIENT_CLINIC_OR_DEPARTMENT_OTHER)
Admission: RE | Admit: 2011-10-13 | Discharge: 2011-10-13 | Disposition: A | Discharge status: Home / Self Care | Payer: BC Managed Care – PPO | Source: Ambulatory Visit | Attending: General Surgery | Admitting: General Surgery

## 2011-10-13 DIAGNOSIS — Z452 Encounter for adjustment and management of vascular access device: Secondary | ICD-10-CM | POA: Insufficient documentation

## 2011-10-13 DIAGNOSIS — C50912 Malignant neoplasm of unspecified site of left female breast: Secondary | ICD-10-CM | POA: Diagnosis present

## 2011-10-13 DIAGNOSIS — C50919 Malignant neoplasm of unspecified site of unspecified female breast: Secondary | ICD-10-CM | POA: Insufficient documentation

## 2011-10-13 DIAGNOSIS — C773 Secondary and unspecified malignant neoplasm of axilla and upper limb lymph nodes: Secondary | ICD-10-CM | POA: Insufficient documentation

## 2011-10-13 HISTORY — PX: PORT-A-CATH REMOVAL: SHX5289

## 2011-10-13 SURGERY — MINOR REMOVAL PORT-A-CATH
Anesthesia: LOCAL | Site: Chest | Laterality: Right | Wound class: Clean

## 2011-10-13 MED ORDER — LIDOCAINE-EPINEPHRINE (PF) 1 %-1:200000 IJ SOLN: INTRAMUSCULAR | Status: DC | PRN

## 2011-10-13 MED ORDER — SODIUM BICARBONATE 4 % IV SOLN
INTRAVENOUS | Status: DC | PRN
  Administered 2011-10-13: 13:00:00

## 2011-10-13 SURGICAL SUPPLY — 29 items
BENZOIN TINCTURE PRP APPL 2/3 (GAUZE/BANDAGES/DRESSINGS) IMPLANT
BLADE SURG 15 STRL LF DISP TIS (BLADE) ×1 IMPLANT
BLADE SURG 15 STRL SS (BLADE) ×1
CLOTH BEACON ORANGE TIMEOUT ST (SAFETY) ×2 IMPLANT
DERMABOND ADVANCED (GAUZE/BANDAGES/DRESSINGS) ×1
DERMABOND ADVANCED .7 DNX12 (GAUZE/BANDAGES/DRESSINGS) ×1 IMPLANT
DRAPE SURG 17X23 STRL (DRAPES) IMPLANT
ELECT REM PT RETURN 9FT ADLT (ELECTROSURGICAL)
ELECTRODE REM PT RTRN 9FT ADLT (ELECTROSURGICAL) IMPLANT
GAUZE SPONGE 4X4 12PLY STRL LF (GAUZE/BANDAGES/DRESSINGS) IMPLANT
GLOVE BIOGEL M STRL SZ7.5 (GLOVE) ×2 IMPLANT
GLOVE EUDERMIC 7 POWDERFREE (GLOVE) ×2 IMPLANT
MARKER SKIN DUAL TIP RULER LAB (MISCELLANEOUS) ×2 IMPLANT
NDL SAFETY ECLIPSE 18X1.5 (NEEDLE) ×1 IMPLANT
NEEDLE HYPO 18GX1.5 SHARP (NEEDLE) ×1
NEEDLE HYPO 25X1 1.5 SAFETY (NEEDLE) ×2 IMPLANT
NS IRRIG 1000ML POUR BTL (IV SOLUTION) IMPLANT
PENCIL BUTTON HOLSTER BLD 10FT (ELECTRODE) IMPLANT
STRIP CLOSURE SKIN 1/2X4 (GAUZE/BANDAGES/DRESSINGS) IMPLANT
SUT MNCRL AB 3-0 PS2 18 (SUTURE) IMPLANT
SUT MON AB 4-0 PC3 18 (SUTURE) ×2 IMPLANT
SUT VIC AB 4-0 P-3 18XBRD (SUTURE) IMPLANT
SUT VIC AB 4-0 P3 18 (SUTURE)
SUT VIC AB 5-0 P-3 18X BRD (SUTURE) IMPLANT
SUT VIC AB 5-0 P3 18 (SUTURE)
SUT VICRYL 3-0 CR8 SH (SUTURE) ×2 IMPLANT
SWABSTICK POVIDONE IODINE SNGL (MISCELLANEOUS) ×6 IMPLANT
SYR CONTROL 10ML LL (SYRINGE) ×2 IMPLANT
TOWEL OR 17X24 6PK STRL BLUE (TOWEL DISPOSABLE) IMPLANT

## 2011-10-13 NOTE — Interval H&P Note (Signed)
History and Physical Interval Note:  10/13/2011 12:34 PM  [name]  has presented today for surgery, with the diagnosis of breast cancer  The goals and the various methods of treatment have been discussed with the patient and family. After consideration of risks, benefits and other options for treatment, the patient has consented to  Procedure(s) (LRB) with comments: MINOR REMOVAL PORT-A-CATH (N/A) - PORT-A CATH REMOVAL as a surgical intervention .  The patient's history has been reviewed, patient examined, no change in status, stable for surgery.  I have reviewed the patient's chart and labs.  Questions were answered to the patient's satisfaction.     Ernestene Mention

## 2011-10-13 NOTE — Op Note (Signed)
Patient Name:           [name]   Date of Surgery:        10/13/2011  Pre op Diagnosis:      Multifocal invasive cancer left breast, triple negative, pathologic stage T2,N2a.   Desires Port-A-Cath removal  Post op Diagnosis:    same  Procedure:                 Removal of Port-A-Cath  Surgeon:                     Angelia Mould. Derrell Lolling, M.D., FACS  Assistant:                      none  Operative Indications:   This 41 year old African American female underwent left modified radical mastectomy and Port-A-Cath placement on 02/21/2011. She received adjuvant chemotherapy on NSABP 42 protocol, the last cycle having been completed on 07/17/2011. She then underwent adjuvant radiation therapy to the chest wall. She has been referred for Port-A-Cath removal. This has been discussed with her as an outpatient. She is operated upon electively.  Operative Findings:       The port and catheter were removed intact without difficulty. There was no sign of infection. There was no bleeding.  Procedure in Detail:          The patient was brought to room 5 at [facility] and the surgery was performed under local anesthesia. The right infraclavicular area was prepped and draped in a sterile fashion. Surgical time out was performed. 1% Xylocaine with epinephrine was used as a local infiltration anesthetic. A transverse incision was made overlying the port, through the old scar. Dissection was carried down into the deep subcutaneous tissue. The capsule around the port was incised, the 3 Prolene sutures holding the port were cut and removed, and the port and catheter removed intact. There was no bleeding. The subcutaneous tissue was closed with interrupted sutures of 3-0 Vicryl and the skin closed with a running subcuticular suture of 4-0 Monocryl and Dermabond. The patient tolerated the procedure well. There were no complications. EBL 5 cc. Counts correct.     Angelia Mould. Derrell Lolling, M.D., FACS General and  Minimally Invasive Surgery Breast and Colorectal Surgery  10/13/2011 12:57 PM

## 2011-10-14 ENCOUNTER — Other Ambulatory Visit: Payer: Self-pay | Admitting: Oncology

## 2011-10-16 ENCOUNTER — Encounter (HOSPITAL_BASED_OUTPATIENT_CLINIC_OR_DEPARTMENT_OTHER): Payer: Self-pay

## 2011-10-17 ENCOUNTER — Encounter (HOSPITAL_BASED_OUTPATIENT_CLINIC_OR_DEPARTMENT_OTHER): Payer: Self-pay | Admitting: General Surgery

## 2011-10-29 ENCOUNTER — Emergency Department (HOSPITAL_COMMUNITY)
Admission: EM | Admit: 2011-10-29 | Discharge: 2011-10-29 | Disposition: A | Discharge status: Home / Self Care | Payer: BC Managed Care – PPO | Attending: Emergency Medicine | Admitting: Emergency Medicine

## 2011-10-29 ENCOUNTER — Encounter (HOSPITAL_COMMUNITY): Payer: Self-pay | Admitting: Emergency Medicine

## 2011-10-29 DIAGNOSIS — Z8042 Family history of malignant neoplasm of prostate: Secondary | ICD-10-CM | POA: Insufficient documentation

## 2011-10-29 DIAGNOSIS — E119 Type 2 diabetes mellitus without complications: Secondary | ICD-10-CM | POA: Insufficient documentation

## 2011-10-29 DIAGNOSIS — Z841 Family history of disorders of kidney and ureter: Secondary | ICD-10-CM | POA: Diagnosis not present

## 2011-10-29 DIAGNOSIS — Z853 Personal history of malignant neoplasm of breast: Secondary | ICD-10-CM | POA: Diagnosis not present

## 2011-10-29 DIAGNOSIS — Z833 Family history of diabetes mellitus: Secondary | ICD-10-CM | POA: Insufficient documentation

## 2011-10-29 DIAGNOSIS — Z803 Family history of malignant neoplasm of breast: Secondary | ICD-10-CM | POA: Insufficient documentation

## 2011-10-29 DIAGNOSIS — Z794 Long term (current) use of insulin: Secondary | ICD-10-CM | POA: Diagnosis not present

## 2011-10-29 DIAGNOSIS — Z8249 Family history of ischemic heart disease and other diseases of the circulatory system: Secondary | ICD-10-CM | POA: Insufficient documentation

## 2011-10-29 DIAGNOSIS — I10 Essential (primary) hypertension: Secondary | ICD-10-CM | POA: Insufficient documentation

## 2011-10-29 DIAGNOSIS — Z888 Allergy status to other drugs, medicaments and biological substances status: Secondary | ICD-10-CM | POA: Diagnosis not present

## 2011-10-29 DIAGNOSIS — Z808 Family history of malignant neoplasm of other organs or systems: Secondary | ICD-10-CM | POA: Insufficient documentation

## 2011-10-29 DIAGNOSIS — R739 Hyperglycemia, unspecified: Secondary | ICD-10-CM

## 2011-10-29 DIAGNOSIS — N390 Urinary tract infection, site not specified: Secondary | ICD-10-CM

## 2011-10-29 LAB — URINE MICROSCOPIC-ADD ON

## 2011-10-29 LAB — URINALYSIS, ROUTINE W REFLEX MICROSCOPIC
Glucose, UA: 250 mg/dL — AB
Hgb urine dipstick: NEGATIVE
Specific Gravity, Urine: 1.028 (ref 1.005–1.030)
Urobilinogen, UA: 1 mg/dL (ref 0.0–1.0)

## 2011-10-29 LAB — COMPREHENSIVE METABOLIC PANEL
ALT: 38 U/L — ABNORMAL HIGH (ref 0–35)
AST: 22 U/L (ref 0–37)
Calcium: 10.4 mg/dL (ref 8.4–10.5)
Sodium: 135 mEq/L (ref 135–145)
Total Protein: 7.3 g/dL (ref 6.0–8.3)

## 2011-10-29 LAB — CBC WITH DIFFERENTIAL/PLATELET
Basophils Absolute: 0 10*3/uL (ref 0.0–0.1)
Basophils Relative: 0 % (ref 0–1)
Eosinophils Absolute: 0.1 10*3/uL (ref 0.0–0.7)
Eosinophils Relative: 1 % (ref 0–5)
MCH: 28.9 pg (ref 26.0–34.0)
MCHC: 35.6 g/dL (ref 30.0–36.0)
MCV: 81.1 fL (ref 78.0–100.0)
Platelets: 179 10*3/uL (ref 150–400)
RDW: 13.3 % (ref 11.5–15.5)
WBC: 9.2 10*3/uL (ref 4.0–10.5)

## 2011-10-29 MED ORDER — SODIUM CHLORIDE 0.9 % IV BOLUS (SEPSIS)
1000.0000 mL | Freq: Once | INTRAVENOUS | Status: AC
  Administered 2011-10-29: 1000 mL via INTRAVENOUS

## 2011-10-29 MED ORDER — SULFAMETHOXAZOLE-TRIMETHOPRIM 800-160 MG PO TABS: 1.0000 | ORAL_TABLET | Freq: Two times a day (BID) | ORAL | Status: DC

## 2011-10-29 NOTE — ED Notes (Addendum)
Pt reports she vomited at home; nauseated; meter would not read blood sugar. Pt recently completed chemo and radiation for breast CA; left arm is restricted.

## 2011-10-29 NOTE — ED Provider Notes (Signed)
History     CSN: [id number]  Arrival date & time 10/29/11  0320   First MD Initiated Contact with Patient 10/29/11 [phone]      Chief Complaint  Patient presents with  . Emesis    (Consider location/radiation/quality/duration/timing/severity/associated sxs/prior treatment) Patient is a 41 y.o. female presenting with vomiting. The history is provided by the patient.  Emesis  This is a new problem. Pertinent negatives include no abdominal pain, no diarrhea and no headaches.   patient developed nausea and vomiting. She states she feels this way when she goes into DKA. She checked her sugar was elevated. It was only 600. She's had some urinary frequency. No chest pain. No cough. She states she is feeling somewhat better. She states that she had checked her insulin pump and found out that was not in the skin. She states she has not been getting the insulin. No dysuria. No fevers.  Past Medical History  Diagnosis Date  . Breast mass in female     fibrocystic changes  . Hyperlipidemia   . Nausea   . Hypertension   . Nausea & vomiting     for approx. 2 weeks  . Cataracts, bilateral   . Diabetes mellitus     insulin dependant - uses pump, Dr. Lucianne [name]  . Breast cancer     T2N2 tiple negative left breast ca   . S/P chemotherapy, time since 4-12 weeks   . History of chemotherapy     taxotere/cytoxan with neulasta support  . Lymphedema of arm     left upper extremity  . Blood clot in vein     nonocclusive clot right subclavian vein, on Xarelto for 6 months  . Allergy   . Breast cancer     Past Surgical History  Procedure Date  . Bladder tacking 01/2009  . Shoulder surgery 04/26/10    left  . Tubal ligation 12/1999  . Left mastectomy 02/21/2011  . Portacath placement 02/21/2011    Procedure: INSERTION PORT-A-CATH;  Surgeon: Ernestene Mention, MD;  Location: [facility] OR;  Service: General;  Laterality: N/A;  . Mastectomy modified radical 02/21/11    left,invasive  multifocal grade III ductal  ca,high grade dcis,lymph/vascular invasion invasion (7/14) nodes positive mets  . Abdominal hysterectomy 2002    still has ovaries   . Port-a-cath removal 10/13/2011    Procedure: MINOR REMOVAL PORT-A-CATH;  Surgeon: Ernestene Mention, MD;  Location: [facility];  Service: General;  Laterality: Right;  PORT-A CATH REMOVAL    Family History  Problem Relation Age of Onset  . Hypertension    . Diabetes    . Deep vein thrombosis    . Kidney disease    . Cancer Maternal Aunt     breast  . Cancer Maternal Uncle 50     right eye removed s/p cancer  . Cancer Maternal Grandfather     prostate ca    History  Substance Use Topics  . Smoking status: Never Smoker   . Smokeless tobacco: Never Used  . Alcohol Use: No    OB History    Grav Para Term Preterm Abortions TAB SAB Ect Mult Living                 Obstetric Comments   G4P3, menarche age 7, and menopause at time of hysterectomy; no hx of hormone replacement therapy      Review of Systems  Constitutional: Negative for activity change and appetite change.  HENT: Negative  for neck stiffness.   Eyes: Negative for pain.  Respiratory: Negative for chest tightness and shortness of breath.   Cardiovascular: Negative for chest pain and leg swelling.  Gastrointestinal: Positive for nausea and vomiting. Negative for abdominal pain and diarrhea.  Genitourinary: Positive for frequency. Negative for flank pain.  Musculoskeletal: Negative for back pain.  Skin: Negative for rash.  Neurological: Negative for weakness, numbness and headaches.  Psychiatric/Behavioral: Negative for behavioral problems.    Allergies  Hydrocodone-acetaminophen; Insulin glargine; and Nph iletin i  Home Medications   Current Outpatient Rx  Name Route Sig Dispense Refill  . ACETAMINOPHEN 325 MG PO TABS Oral Take 650 mg by mouth every 6 (six) hours as needed. For pain    . EMOLLIENT BASE EX CREA Topical Apply 1 application topically 2 (two)  times daily.    Marland Kitchen ENOXAPARIN SODIUM 100 MG/ML [place] SOLN Subcutaneous Inject 1 mL (100 mg total) into the skin daily. 30 Syringe 4  . GABAPENTIN 300 MG PO CAPS Oral Take 2 capsules (600 mg total) by mouth 3 (three) times daily. 180 capsule 1  . INSULIN PUMP Subcutaneous Inject 1 each into the skin continuous. Novolog insulin    . LISINOPRIL 10 MG PO TABS Oral Take 10 mg by mouth daily. For blood pressure     . OXYCODONE-ACETAMINOPHEN 5-500 MG PO CAPS Oral Take 1 capsule by mouth every 8 (eight) hours as needed. For pain    . VENLAFAXINE HCL 37.5 MG PO TABS Oral Take 1 tablet (37.5 mg total) by mouth 2 (two) times daily. 60 tablet 3  . SULFAMETHOXAZOLE-TRIMETHOPRIM 800-160 MG PO TABS Oral Take 1 tablet by mouth every 12 (twelve) hours. 10 tablet 0    BP 113/72  Pulse 115  Temp 98.2 F (36.8 C) (Oral)  Resp 16  SpO2 99%  Physical Exam  Nursing note and vitals reviewed. Constitutional: She is oriented to person, place, and time. She appears well-developed and well-nourished.  HENT:  Head: Normocephalic and atraumatic.  Eyes: EOM are normal. Pupils are equal, round, and reactive to light.  Neck: Normal range of motion. Neck supple.  Cardiovascular: Regular rhythm and normal heart sounds.   No murmur heard.      Tachycardia  Pulmonary/Chest: Effort normal and breath sounds normal. No respiratory distress. She has no wheezes. She has no rales.  Abdominal: Soft. Bowel sounds are normal. She exhibits no distension. There is no tenderness. There is no rebound and no guarding.       Insulin pump to abdomen.  Musculoskeletal:       Left upper extremity and compression garment.  Neurological: She is alert and oriented to person, place, and time. No cranial nerve deficit.  Skin: Skin is warm and dry.  Psychiatric: She has a normal mood and affect. Her speech is normal.    ED Course  Procedures (including critical care time)  Labs Reviewed  CBC WITH DIFFERENTIAL - Abnormal; Notable for the  following:    HCT 35.1 (*)     Neutrophils Relative 81 (*)     Lymphocytes Relative 11 (*)     All other components within normal limits  COMPREHENSIVE METABOLIC PANEL - Abnormal; Notable for the following:    Chloride 95 (*)     Glucose, Bld 370 (*)     ALT 38 (*)     Alkaline Phosphatase 187 (*)     All other components within normal limits  GLUCOSE, CAPILLARY - Abnormal; Notable for the following:  Glucose-Capillary 398 (*)     All other components within normal limits  URINALYSIS, ROUTINE W REFLEX MICROSCOPIC - Abnormal; Notable for the following:    Color, Urine AMBER (*)  BIOCHEMICALS MAY BE AFFECTED BY COLOR   APPearance CLOUDY (*)     Glucose, UA 250 (*)     Bilirubin Urine LARGE (*)     Ketones, ur >80 (*)     Leukocytes, UA MODERATE (*)     All other components within normal limits  URINE MICROSCOPIC-ADD ON - Abnormal; Notable for the following:    Squamous Epithelial / LPF FEW (*)     All other components within normal limits  GLUCOSE, CAPILLARY - Abnormal; Notable for the following:    Glucose-Capillary 172 (*)     All other components within normal limits  LAB REPORT - SCANNED   No results found.   1. Hyperglycemia   2. UTI (lower urinary tract infection)       MDM  Hyperglycemia from having insulin pump disconnected. Sugar improved. Ketones in urine, but normal bicarb. Feels much better. Also has UTi        Juliet Rude. Rubin Payor, MD 10/30/11 2134

## 2011-10-29 NOTE — ED Provider Notes (Signed)
Patient does have ketones in her urine and evidence of a new urinary tract infection. On reexam she is feeling much better since her pump has been reconnected and is tolerating by mouth's without any vomiting. She will go home and drink plenty of fluids and monitor her ketones clearing in her urine by her prescription. We'll also start her on a course of Bactrim for a presumed new UTI  Gwyneth Sprout, MD 10/29/11 [phone]

## 2011-10-29 NOTE — ED Notes (Signed)
CBG 398 in triage.

## 2011-10-29 NOTE — ED Notes (Signed)
NAD noted at time of d/c.

## 2011-11-02 ENCOUNTER — Ambulatory Visit
Admission: RE | Admit: 2011-11-02 | Discharge: 2011-11-02 | Disposition: A | Discharge status: Home / Self Care | Payer: BC Managed Care – PPO | Source: Ambulatory Visit | Attending: Radiation Oncology | Admitting: Radiation Oncology

## 2011-11-02 ENCOUNTER — Encounter: Payer: Self-pay | Admitting: Radiation Oncology

## 2011-11-02 VITALS — BP 134/82 | HR 87 | Temp 98.6°F | Wt 180.3 lb

## 2011-11-02 DIAGNOSIS — C50912 Malignant neoplasm of unspecified site of left female breast: Secondary | ICD-10-CM

## 2011-11-02 HISTORY — DX: Reserved for inherently not codable concepts without codable children: IMO0001

## 2011-11-02 HISTORY — DX: Reserved for concepts with insufficient information to code with codable children: IMO0002

## 2011-11-02 NOTE — Progress Notes (Signed)
   Department of Radiation Oncology  Phone:  [phone] Fax:        [phone]   Name: [name]   DOB: [birth year]  MRN: [id number]    Date: 11/02/2011  Follow Up Visit Note  Diagnosis: Left breast cancer  Interval since last radiation: 1 month   Interval History: [name] presents today for routine followup.  She is recovered well from radiation. She has an appointment Dr. Donnie Coffin next week and then with Dr. Derrell Lolling the following week. She also has an appointment with plastic surgery to discuss reconstruction. Her Port-A-Cath was removed in September. She has been treated for urinary tract infection. She's also been treated for a blood clot in her arm. She has been using lotion with vitamin E.  Allergies:  Allergies  Allergen Reactions  . Hydrocodone-Acetaminophen Itching and Swelling  . Insulin Glargine Itching, Nausea And Vomiting and Swelling  . Nph Iletin I (Insulin Isophane, Mixed Nph) Itching, Nausea And Vomiting and Swelling    Medications:  Current Outpatient Prescriptions  Medication Sig Dispense Refill  . acetaminophen (TYLENOL) 325 MG tablet Take 650 mg by mouth every 6 (six) hours as needed. For pain      . enoxaparin (LOVENOX) 100 MG/ML injection Inject 1 mL (100 mg total) into the skin daily.  30 Syringe  4  . gabapentin (NEURONTIN) 300 MG capsule Take 2 capsules (600 mg total) by mouth 3 (three) times daily.  180 capsule  1  . Insulin Human (INSULIN PUMP) 100 unit/ml SOLN Inject 1 each into the skin continuous. Novolog insulin      . lisinopril (PRINIVIL,ZESTRIL) 10 MG tablet Take 10 mg by mouth daily. For blood pressure       . oxyCODONE-acetaminophen (TYLOX) 5-500 MG per capsule Take 1 capsule by mouth every 8 (eight) hours as needed. For pain      . sulfamethoxazole-trimethoprim (SEPTRA DS) 800-160 MG per tablet Take 1 tablet by mouth every 12 (twelve) hours.  10 tablet  0  . venlafaxine (EFFEXOR) 37.5 MG tablet Take 1 tablet (37.5 mg total) by  mouth 2 (two) times daily.  60 tablet  3  . emollient (BIAFINE) cream Apply 1 application topically 2 (two) times daily.        Physical Exam:   weight is 180 lb 4.8 oz (81.784 kg). Her temperature is 98.6 F (37 C). Her blood pressure is 134/82 and her pulse is 87.  She has skin darkening over the treated area of her left chest. This is very minimal. She has no skin tightening or telangiectasia  IMPRESSION: [name] is a 41 y.o. female status post radiation healing from the acute effects  PLAN:  She looks good. She has regular followup scheduled Dr. Donnie Coffin. I released her from followup with me. We talked about skin protection in the consultations associated with plastic surgery and reconstruction. She knows she can contact me with any questions or concerns.     Lurline Hare, MD

## 2011-11-02 NOTE — Progress Notes (Signed)
Patient here for routine one month follow up post completion of left chest wall radiation (breast cancer).States skin has healed, no further feeling just discoloration.Currently has 3 more days of antibiotic therapy for urinary tract infection.Also on levonox for blood clot.Porta-cath removed on 10/13/11.Denies pain.

## 2011-11-09 ENCOUNTER — Telehealth: Payer: Self-pay | Admitting: *Deleted

## 2011-11-09 ENCOUNTER — Ambulatory Visit (HOSPITAL_BASED_OUTPATIENT_CLINIC_OR_DEPARTMENT_OTHER): Payer: BC Managed Care – PPO | Admitting: Oncology

## 2011-11-09 ENCOUNTER — Other Ambulatory Visit (HOSPITAL_BASED_OUTPATIENT_CLINIC_OR_DEPARTMENT_OTHER): Payer: BC Managed Care – PPO | Admitting: Lab

## 2011-11-09 VITALS — BP 133/76 | HR 91 | Temp 98.5°F | Resp 20 | Ht 65.0 in | Wt 178.6 lb

## 2011-11-09 DIAGNOSIS — C50912 Malignant neoplasm of unspecified site of left female breast: Secondary | ICD-10-CM

## 2011-11-09 DIAGNOSIS — R599 Enlarged lymph nodes, unspecified: Secondary | ICD-10-CM | POA: Diagnosis not present

## 2011-11-09 DIAGNOSIS — C50419 Malignant neoplasm of upper-outer quadrant of unspecified female breast: Secondary | ICD-10-CM | POA: Diagnosis not present

## 2011-11-09 DIAGNOSIS — I82B19 Acute embolism and thrombosis of unspecified subclavian vein: Secondary | ICD-10-CM

## 2011-11-09 DIAGNOSIS — C50919 Malignant neoplasm of unspecified site of unspecified female breast: Secondary | ICD-10-CM

## 2011-11-09 LAB — CBC WITH DIFFERENTIAL/PLATELET
BASO%: 1.9 % (ref 0.0–2.0)
Eosinophils Absolute: 0.1 10*3/uL (ref 0.0–0.5)
LYMPH%: 39 % (ref 14.0–49.7)
MCHC: 35 g/dL (ref 31.5–36.0)
MCV: 84.6 fL (ref 79.5–101.0)
MONO%: 5.5 % (ref 0.0–14.0)
NEUT#: 1.3 10*3/uL — ABNORMAL LOW (ref 1.5–6.5)
RBC: 4.29 10*6/uL (ref 3.70–5.45)
RDW: 14 % (ref 11.2–14.5)
WBC: 2.6 10*3/uL — ABNORMAL LOW (ref 3.9–10.3)

## 2011-11-09 MED ORDER — ZOLPIDEM TARTRATE 5 MG PO TABS: 5.0000 mg | ORAL_TABLET | Freq: Every evening | ORAL | Status: DC | PRN

## 2011-11-09 NOTE — Telephone Encounter (Signed)
error 

## 2011-11-09 NOTE — Telephone Encounter (Signed)
Made patient appointment for three months mammogram at the breast center

## 2011-11-09 NOTE — Progress Notes (Signed)
Hematology and Oncology Follow Up Visit  [name] [id number] 12/25/1970 41 y.o. 11/09/2011    HPI: Kahlina is a 41 year old [place] woman with a pathologically staged T2, N2 a triple negative left breast carcinoma, s/p left modified radical mastectomy with axillary lymph node dissection on 02/21/2011. She is participating in NSABP B-49, and was been randomized to 6 cycles of every 3 week Taxotere/Cytoxan with Neulasta support on day 2, held with cycle 2.  Completed  cycle 6/6 07/17/11.  2. Insulin pump dependent diabetic.  3. History of severe afebrile neutropenia due to Neulasta being held on day 2, normalization of total white blood cell count and ANC, restarted with cycle 3.  4. Grade 1 nailbed sensitivity, secondary to Taxotere-based therapy, improvement since completion of chemo.   5. Left upper extremity lymphedema, improved with use of compression garment.   6. Peripheral neuropathy, persistent after the use of Gabapentin.   7. Right upper extremity DVT, involvement of the right subclavian vein.   Interim History:   Amaura is seen today for followup,and she is doing well. Her blood sugars remain somewhat erratic. [place] Clam are low. I am she continues to be followed carefully for that. She continues to have some peripheral neuropathy symptoms which are chronic for her. She finished radiation a month ago. She also had her port removed in the past month. She continues to take Lovenox daily. She'll be due for follow mammogram in November of this year. Girtha Rm of the 10 point  review of systems is negative.  Medications:   I have reviewed the patient's current medications.  Current Outpatient Prescriptions  Medication Sig Dispense Refill  . acetaminophen (TYLENOL) 325 MG tablet Take 650 mg by mouth every 6 (six) hours as needed. For pain      . enoxaparin (LOVENOX) 100 MG/ML injection Inject 1 mL (100 mg total) into the skin daily.  30 Syringe  4    . gabapentin (NEURONTIN) 300 MG capsule Take 2 capsules (600 mg total) by mouth 3 (three) times daily.  180 capsule  1  . Insulin Human (INSULIN PUMP) 100 unit/ml SOLN Inject 1 each into the skin continuous. Novolog insulin      . lisinopril (PRINIVIL,ZESTRIL) 10 MG tablet Take 10 mg by mouth daily. For blood pressure       . oxyCODONE-acetaminophen (TYLOX) 5-500 MG per capsule Take 1 capsule by mouth every 8 (eight) hours as needed. For pain      . venlafaxine (EFFEXOR) 37.5 MG tablet Take 1 tablet (37.5 mg total) by mouth 2 (two) times daily.  60 tablet  3  . emollient (BIAFINE) cream Apply 1 application topically 2 (two) times daily.      Marland Kitchen sulfamethoxazole-trimethoprim (SEPTRA DS) 800-160 MG per tablet Take 1 tablet by mouth every 12 (twelve) hours.  10 tablet  0  . zolpidem (AMBIEN) 5 MG tablet Take 1 tablet (5 mg total) by mouth at bedtime as needed.  30 tablet  1    Allergies:  Allergies  Allergen Reactions  . Hydrocodone-Acetaminophen Itching and Swelling  . Insulin Glargine Itching, Nausea And Vomiting and Swelling  . Nph Iletin I (Insulin Isophane, Mixed Nph) Itching, Nausea And Vomiting and Swelling    Physical Exam: Filed Vitals:   11/09/11 1006  BP: 133/76  Pulse: 91  Temp: 98.5 F (36.9 C)  Resp: 20  Weight: 189 lbs. HEENT:  Sclerae anicteric, conjunctivae pink.  Oropharynx clear.  No mucositis or candidiasis.   Nodes:  No cervical, supraclavicular, or axillary lymphadenopathy palpated.  Breast Exam:  Deferred.   Lungs:  Clear to auscultation bilaterally.  No crackles, rhonchi, or wheezes.   Heart:  Regular rate and rhythm. ABD:  Soft, nontender with normal bowel sounds. Insulin pump in place. Extremities:  Left upper extremity fullness, consistent with lymphedema, has improved significantly, compression sleeve in place.  No evidence of palpable venous cords or evidence of streaking, but evidence of bilateral lower extremity 1+ edema.  Good range of motion.  The right  upper extremity does show evidence of edema but good range of motion.  NEURO: Alert and oriented x3.  Lower extremity muscular strength is intact. BREAST EXAM: In the supine position, with the right arm over the head, the right nipple is everted. No periareolar edema or nipple discharge. No mass in any quadrant or subareolar region. No redness of the skin. No right axillary adenopathy. With the left arm over the head, the left breast is surgically absent. Scar is flat with slight dimpling, lateral aspect. No redness of the skin. No left axillary adenopathy.    Lab Results: Lab Results  Component Value Date   WBC 2.6* 11/09/2011   HGB 12.7 11/09/2011   HCT 36.3 11/09/2011   MCV 84.6 11/09/2011   PLT 223 11/09/2011   NEUTROABS 1.3* 11/09/2011     Chemistry      Component Value Date/Time   NA 135 10/29/2011 0334   K 4.1 10/29/2011 0334   CL 95* 10/29/2011 0334   CO2 21 10/29/2011 0334   BUN 22 10/29/2011 0334   CREATININE 0.77 10/29/2011 0334      Component Value Date/Time   CALCIUM 10.4 10/29/2011 0334   ALKPHOS 187* 10/29/2011 0334   AST 22 10/29/2011 0334   ALT 38* 10/29/2011 0334   BILITOT 1.1 10/29/2011 0334  CMET drawn today is pending.     Assessment:  Odaly is a 41 year old [place] woman with a pathologically staged T2, N2 a triple negative left breast carcinoma, s/p left modified radical mastectomy with axillary lymph node dissection on 02/21/2011. She is participating in NSABP B-49, and was randomized to 6 cycles of every 3 week Taxotere/Cytoxan with Neulasta support on day 2, held with cycle 2.  Completed chemo 07/17/11. Radiation therapy completed September 2013, she continues on Lovenox daily.  2. Insulin pump dependent diabetic, moderately controlled. .   4. Bilateral upper extremity peripheral neuropathy.   5. Left upper extremity lymphedema, improved with compression sleeve and glove.   6. Persistent upper extremity peripheral neuropathy.  .   8.  Severe and frequent hot flashes, present since chemotherapy. We discussed where studies using dermatherapy sheets.  ECOG 0  Plan:  She is continued on Lovenox. Her port is out. I recommended she discontinue Lovenox at the end of this month. I will plan to see her back in followup in about 3 months time.   Tyaisha Cullom  MD ,11/09/2011

## 2011-11-16 ENCOUNTER — Encounter (INDEPENDENT_AMBULATORY_CARE_PROVIDER_SITE_OTHER): Payer: BC Managed Care – PPO | Admitting: General Surgery

## 2011-11-21 DIAGNOSIS — Z9010 Acquired absence of unspecified breast and nipple: Secondary | ICD-10-CM | POA: Diagnosis not present

## 2011-11-21 DIAGNOSIS — E1065 Type 1 diabetes mellitus with hyperglycemia: Secondary | ICD-10-CM | POA: Diagnosis not present

## 2011-11-21 DIAGNOSIS — E7800 Pure hypercholesterolemia, unspecified: Secondary | ICD-10-CM | POA: Diagnosis not present

## 2011-11-21 DIAGNOSIS — Z23 Encounter for immunization: Secondary | ICD-10-CM | POA: Diagnosis not present

## 2011-11-21 DIAGNOSIS — I10 Essential (primary) hypertension: Secondary | ICD-10-CM | POA: Diagnosis not present

## 2011-12-29 ENCOUNTER — Telehealth: Payer: Self-pay | Admitting: *Deleted

## 2011-12-29 ENCOUNTER — Ambulatory Visit
Admission: RE | Admit: 2011-12-29 | Discharge: 2011-12-29 | Disposition: A | Discharge status: Home / Self Care | Payer: BC Managed Care – PPO | Source: Ambulatory Visit | Attending: Oncology | Admitting: Oncology

## 2011-12-29 DIAGNOSIS — C50912 Malignant neoplasm of unspecified site of left female breast: Secondary | ICD-10-CM

## 2011-12-29 NOTE — Telephone Encounter (Signed)
Per md being on vac mailed out new calendar to inform the patient of the new date and time

## 2012-01-02 ENCOUNTER — Other Ambulatory Visit: Payer: Self-pay | Admitting: Oncology

## 2012-01-02 DIAGNOSIS — E1065 Type 1 diabetes mellitus with hyperglycemia: Secondary | ICD-10-CM | POA: Diagnosis not present

## 2012-01-02 DIAGNOSIS — R928 Other abnormal and inconclusive findings on diagnostic imaging of breast: Secondary | ICD-10-CM

## 2012-01-10 ENCOUNTER — Ambulatory Visit
Admission: RE | Admit: 2012-01-10 | Discharge: 2012-01-10 | Disposition: A | Discharge status: Home / Self Care | Payer: BC Managed Care – PPO | Source: Ambulatory Visit | Attending: Oncology | Admitting: Oncology

## 2012-01-10 DIAGNOSIS — R928 Other abnormal and inconclusive findings on diagnostic imaging of breast: Secondary | ICD-10-CM

## 2012-01-30 ENCOUNTER — Other Ambulatory Visit: Payer: Self-pay | Admitting: Oncology

## 2012-02-06 ENCOUNTER — Ambulatory Visit (INDEPENDENT_AMBULATORY_CARE_PROVIDER_SITE_OTHER): Payer: Commercial Managed Care - PPO | Admitting: Family Medicine

## 2012-02-06 VITALS — BP 142/93 | HR 87 | Temp 98.5°F | Ht 65.0 in | Wt 189.0 lb

## 2012-02-06 DIAGNOSIS — R220 Localized swelling, mass and lump, head: Secondary | ICD-10-CM

## 2012-02-06 DIAGNOSIS — R221 Localized swelling, mass and lump, neck: Secondary | ICD-10-CM | POA: Insufficient documentation

## 2012-02-06 MED ORDER — CEPHALEXIN 500 MG PO CAPS: 500.0000 mg | ORAL_CAPSULE | Freq: Two times a day (BID) | ORAL | Status: DC

## 2012-02-06 NOTE — Assessment & Plan Note (Signed)
Feel this is a cyst- possibly an abscess, will start keflex in case of infection.  Discussed with patient that this could be I & D'd in our office but since this is the second time it has happened I suspect it may come back again, therefore I recommend going to see surgery to discuss removal.  She agrees with this plan, will call [facility] for appointment.

## 2012-02-06 NOTE — Patient Instructions (Signed)
It was good to see you.  Please start taking the Keflex twice daily until you see [facility].  If you develop worsening pain, redness around that area, or fever, chills, please call the office.

## 2012-02-06 NOTE — Progress Notes (Signed)
  Subjective:    Patient ID: [name], female    DOB: 06/15/1970, 42 y.o.   MRN: [id number]  HPI  [name] comes in with a swollen cyst on her left neck base.  This has been present for about one week.  She denies fevers, chills, rash/redness/warmth of skin or lumps or bumps anywhere else on her body.  Denies any neurologic symptoms in hand.  She has had this before once and her oncologist told her to put warm compresses on it and not to squeeze it.  She says she came in because it hurts.  She is almost 1 year out from a left modified mastectomy with lymph node dissection, and has completed her chemotherapy treatment.    Review of Systems See HPI    Objective:   Physical Exam BP 142/93  Pulse 87  Temp 98.5 F (36.9 C) (Oral)  Ht 5\' 5"  (1.651 m)  Wt 189 lb (85.73 kg)  BMI 31.45 kg/m2 General appearance: alert, cooperative and no distress Neck: There is a 1.5 x 2 cm raised, fluctuant cyst over base of neck over trapezius muscle.  +tenderness to palpation, but no erythema, drainage, warmth of skin.  Otherwise, no adenopathy of neck, supraclavicular area, or axilla.  Left arm in lymphedema sleeve.        Assessment & Plan:

## 2012-02-13 ENCOUNTER — Encounter (INDEPENDENT_AMBULATORY_CARE_PROVIDER_SITE_OTHER): Payer: Self-pay | Admitting: General Surgery

## 2012-02-13 ENCOUNTER — Ambulatory Visit (INDEPENDENT_AMBULATORY_CARE_PROVIDER_SITE_OTHER): Payer: Commercial Managed Care - PPO | Admitting: General Surgery

## 2012-02-13 VITALS — BP 134/76 | HR 72 | Temp 97.9°F | Resp 18 | Ht 65.0 in | Wt 191.0 lb

## 2012-02-13 DIAGNOSIS — R220 Localized swelling, mass and lump, head: Secondary | ICD-10-CM

## 2012-02-13 DIAGNOSIS — R221 Localized swelling, mass and lump, neck: Secondary | ICD-10-CM

## 2012-02-13 NOTE — Progress Notes (Signed)
Patient ID: [name], female   DOB: [birth year], 42 y.o.   MRN: [id number] History: This patient was referred back to me by Dr. Ardyth Gal at the [facility] for evaluation of an inflamed cyst of her left neck base. This patient has a significant past history of a left modified radical mastectomy and Port-A-Cath insertion on 02/21/2011. She underwent adjuvant chemotherapy and adjuvant radiation therapy. This was a triple negative breast cancer, stage T2, N2a,  multifocal. She did well. I removed the Port-A-Cath on 10/13/2011. She is being followed by Meliton Rattan and is going to see her in April to talk about delayed reconstruction She has noticed a lump in her left posterior neck base for about 6 or 7 months. It intermittently swells becomes painful and then settles down. This became painful recently. She was started on Keflex on 02/06/2012. She feels better now.  ROS: 10 system review of systems is negative except as described above  Exam: Patient looks well. No distress Neck: 1.5 cm rubbery mass left posterior neck, slightly tender, consistent with cysts. No other adenopathy. No other mass Lungs: Clear to auscultation bilaterally Heart: Regular rate and rhythm. No ectopy. No murmur Breast: Left mastectomy wound well healed. Tanning from  radiation therapy. Using a prosthesis.  Assessment: Inflamed sebaceous cyst left neck. Triple negative breast cancer, left breast, pathologic stage T2, N2a.. Doing well one year following left modified radical mastectomy and Port-A-Cath insertion  Plan: Scheduled for excision of the left neck mass under local anesthesia in the near future I discussed the indications, details, techniques, and numerous risks of the surgery with her. She understands all these issues. All of her questions are answered. She agrees with this plan.  Angelia Mould. Derrell Lolling, M.D., [facility] General and Minimally invasive  Surgery Breast and Colorectal Surgery Office:   [phone] Pager:   [phone]

## 2012-02-13 NOTE — Patient Instructions (Addendum)
The small lump in your left neck is probably a benign cyst which is intermittently getting infected.  Continue the antibiotics that you're taking until they are all gone.  You will be scheduled for excision of this mass under local anesthesia in the near future.

## 2012-02-16 ENCOUNTER — Ambulatory Visit: Payer: BC Managed Care – PPO | Admitting: Oncology

## 2012-02-16 ENCOUNTER — Other Ambulatory Visit: Payer: BC Managed Care – PPO | Admitting: Lab

## 2012-02-16 NOTE — H&P (Signed)
           [name]  Description:  42 year old female  02/13/2012 11:00 AM Office Visit Provider:  Ernestene Mention, MD  MRN: [id number] Department:  [facility]            Diagnoses     Mass of left side of neck - Primary     784.2            Vitals - Last Recorded       BP  Pulse  Temp  Resp  Ht  Wt    134/76  72  97.9 F (36.6 C) (Temporal)  18  5\' 5"  (1.651 m)  191 lb (86.637 kg)          BMI               31.78 kg/m2                          History and Physical    Ernestene Mention, MD 02/16/2012 11:30 AM Signed  Patient ID: [name], female DOB: Sep 11, 1970, 42 y.o. MRN: [id number]  History: This patient was referred back to me by Dr. Ardyth Gal at the [facility] for evaluation of an inflamed cyst of her left neck base.  This patient has a significant past history of a left modified radical mastectomy and Port-A-Cath insertion on 02/21/2011. She underwent adjuvant chemotherapy and adjuvant radiation therapy. This was a triple negative breast cancer, stage T2, N2a, multifocal. She did well. I removed the Port-A-Cath on 10/13/2011. She is being followed by Meliton Rattan and is going to see her in April to talk about delayed reconstruction  She has noticed a lump in her left posterior neck base for about 6 or 7 months. It intermittently swells becomes painful and then settles down. This became painful recently. She was started on Keflex on 02/06/2012. She feels better now.   ROS: 10 system review of systems is negative except as described above   Exam: Patient looks well. No distress  Neck: 1.5 cm rubbery mass left posterior neck, slightly tender, consistent with cysts. No other adenopathy. No other mass  Lungs: Clear to auscultation bilaterally  Heart: Regular rate and rhythm. No ectopy. No murmur  Breast: Left mastectomy wound well healed. Tanning from radiation therapy. Using a prosthesis.    Assessment: Inflamed sebaceous cyst left neck.  Triple negative breast cancer, left breast, pathologic stage T2, N2a.. Doing well one year following left modified radical mastectomy and Port-A-Cath insertion   Plan: Scheduled for excision of the left neck mass under local anesthesia in the near future  I discussed the indications, details, techniques, and numerous risks of the surgery with her. She understands all these issues. All of her questions are answered. She agrees with this plan.    Angelia Mould. Derrell Lolling, M.D., [facility]  General and Minimally invasive Surgery  Breast and Colorectal Surgery  Office: [phone]  Pager: [phone]

## 2012-02-19 ENCOUNTER — Encounter (HOSPITAL_BASED_OUTPATIENT_CLINIC_OR_DEPARTMENT_OTHER)
Admission: RE | Disposition: A | Discharge status: Home / Self Care | Payer: Self-pay | Source: Ambulatory Visit | Attending: General Surgery

## 2012-02-19 ENCOUNTER — Ambulatory Visit (HOSPITAL_BASED_OUTPATIENT_CLINIC_OR_DEPARTMENT_OTHER)
Admission: RE | Admit: 2012-02-19 | Discharge: 2012-02-19 | Disposition: A | Discharge status: Home / Self Care | Payer: Commercial Managed Care - PPO | Source: Ambulatory Visit | Attending: General Surgery | Admitting: General Surgery

## 2012-02-19 ENCOUNTER — Encounter (HOSPITAL_BASED_OUTPATIENT_CLINIC_OR_DEPARTMENT_OTHER): Payer: Self-pay

## 2012-02-19 ENCOUNTER — Telehealth (INDEPENDENT_AMBULATORY_CARE_PROVIDER_SITE_OTHER): Payer: Self-pay | Admitting: General Surgery

## 2012-02-19 DIAGNOSIS — L0390 Cellulitis, unspecified: Secondary | ICD-10-CM | POA: Diagnosis not present

## 2012-02-19 DIAGNOSIS — L089 Local infection of the skin and subcutaneous tissue, unspecified: Secondary | ICD-10-CM | POA: Diagnosis not present

## 2012-02-19 DIAGNOSIS — C50919 Malignant neoplasm of unspecified site of unspecified female breast: Secondary | ICD-10-CM | POA: Diagnosis not present

## 2012-02-19 DIAGNOSIS — R221 Localized swelling, mass and lump, neck: Secondary | ICD-10-CM | POA: Diagnosis present

## 2012-02-19 DIAGNOSIS — Z853 Personal history of malignant neoplasm of breast: Secondary | ICD-10-CM

## 2012-02-19 DIAGNOSIS — L03221 Cellulitis of neck: Secondary | ICD-10-CM

## 2012-02-19 DIAGNOSIS — L0211 Cutaneous abscess of neck: Secondary | ICD-10-CM | POA: Insufficient documentation

## 2012-02-19 DIAGNOSIS — L0291 Cutaneous abscess, unspecified: Secondary | ICD-10-CM | POA: Diagnosis not present

## 2012-02-19 HISTORY — PX: MASS EXCISION: SHX2000

## 2012-02-19 SURGERY — MINOR EXCISION OF MASS
Anesthesia: LOCAL | Site: Neck | Laterality: Left | Wound class: Dirty or Infected

## 2012-02-19 MED ORDER — HYDROCODONE-ACETAMINOPHEN 5-325 MG PO TABS: 1.0000 | ORAL_TABLET | ORAL | Status: DC | PRN

## 2012-02-19 MED ORDER — CHLORHEXIDINE GLUCONATE 4 % EX LIQD: 1.0000 "application " | Freq: Once | CUTANEOUS | Status: DC

## 2012-02-19 MED ORDER — DOXYCYCLINE HYCLATE 100 MG PO TABS: 100.0000 mg | ORAL_TABLET | Freq: Two times a day (BID) | ORAL | Status: DC

## 2012-02-19 MED ORDER — SODIUM BICARBONATE 4 % IV SOLN
INTRAVENOUS | Status: DC | PRN
  Administered 2012-02-19: 11:00:00 via INTRAMUSCULAR

## 2012-02-19 SURGICAL SUPPLY — 37 items
BENZOIN TINCTURE PRP APPL 2/3 (GAUZE/BANDAGES/DRESSINGS) IMPLANT
BLADE SURG 15 STRL LF DISP TIS (BLADE) ×1 IMPLANT
BLADE SURG 15 STRL SS (BLADE) ×1
CLOTH BEACON ORANGE TIMEOUT ST (SAFETY) ×2 IMPLANT
COTTONBALL LRG STERILE PKG (GAUZE/BANDAGES/DRESSINGS) IMPLANT
DERMABOND ADVANCED (GAUZE/BANDAGES/DRESSINGS)
DERMABOND ADVANCED .7 DNX12 (GAUZE/BANDAGES/DRESSINGS) IMPLANT
DRAPE SURG 17X23 STRL (DRAPES) IMPLANT
DRSG TEGADERM 4X4.75 (GAUZE/BANDAGES/DRESSINGS) ×2 IMPLANT
ELECT REM PT RETURN 9FT ADLT (ELECTROSURGICAL)
ELECTRODE REM PT RTRN 9FT ADLT (ELECTROSURGICAL) IMPLANT
GAUZE PACKING IODOFORM 1/4X5 (PACKING) ×2 IMPLANT
GAUZE SPONGE 4X4 12PLY STRL LF (GAUZE/BANDAGES/DRESSINGS) IMPLANT
GLOVE BIO SURGEON STRL SZ 6.5 (GLOVE) ×2 IMPLANT
GLOVE EUDERMIC 7 POWDERFREE (GLOVE) ×4 IMPLANT
MARKER SKIN DUAL TIP RULER LAB (MISCELLANEOUS) ×2 IMPLANT
NDL SAFETY ECLIPSE 18X1.5 (NEEDLE) ×1 IMPLANT
NEEDLE HYPO 18GX1.5 SHARP (NEEDLE) ×1
NEEDLE HYPO 25X1 1.5 SAFETY (NEEDLE) ×2 IMPLANT
NS IRRIG 1000ML POUR BTL (IV SOLUTION) IMPLANT
PENCIL BUTTON HOLSTER BLD 10FT (ELECTRODE) IMPLANT
STRIP CLOSURE SKIN 1/2X4 (GAUZE/BANDAGES/DRESSINGS) IMPLANT
SUT ETHILON 4 0 PS 2 18 (SUTURE) ×2 IMPLANT
SUT MNCRL AB 3-0 PS2 18 (SUTURE) IMPLANT
SUT MNCRL AB 4-0 PS2 18 (SUTURE) IMPLANT
SUT SILK 3 0 TIES 17X18 (SUTURE)
SUT SILK 3-0 18XBRD TIE BLK (SUTURE) IMPLANT
SUT VIC AB 4-0 P-3 18XBRD (SUTURE) IMPLANT
SUT VIC AB 4-0 P3 18 (SUTURE)
SUT VIC AB 5-0 P-3 18X BRD (SUTURE) IMPLANT
SUT VIC AB 5-0 P3 18 (SUTURE)
SUT VICRYL 3-0 CR8 SH (SUTURE) IMPLANT
SUT VICRYL AB 3 0 TIES (SUTURE) IMPLANT
SWABSTICK POVIDONE IODINE SNGL (MISCELLANEOUS) ×8 IMPLANT
SYR CONTROL 10ML LL (SYRINGE) ×2 IMPLANT
TOWEL OR 17X24 6PK STRL BLUE (TOWEL DISPOSABLE) IMPLANT
TRAY DSU PREP LF (CUSTOM PROCEDURE TRAY) IMPLANT

## 2012-02-19 NOTE — Interval H&P Note (Signed)
History and Physical Interval Note:  02/19/2012 10:18 AM  [name]  has presented today for surgery, with the diagnosis of left neck mass (1.5 cm)  The goals and the  various methods of treatment have been discussed with the patient and family. After consideration of risks(bleeding, infection, nerve damage, chronic pain, etc.), benefits and other options for treatment, the patient has consented to  Procedure(s) (LRB) with comments: MINOR EXCISION OF MASS (Left) as a surgical intervention .  The patient's history has been reviewed, patient examined today, no change in status, stable for surgery.  I have reviewed the patient's chart and labs.  Questions were answered to the patient's satisfaction.     Ernestene Mention

## 2012-02-19 NOTE — Op Note (Signed)
Patient Name:           [name]   Date of Surgery:        02/19/2012  Pre op Diagnosis:      1.5 cm subcutaneous mass, left neck, posterior triangle, history of breast cancer  Post op Diagnosis:    Infected cyst, left neck, posterior triangle  Procedure:                 Excision 1.5 cm mass left posterior neck  Surgeon:                     Angelia Mould. Derrell Lolling, M.D., FACS  Assistant:                      None  Operative Indications:This Insulin dependent diabetic patient was referred back to me by Dr. Ardyth Gal at the [facility] for evaluation of an inflamed cyst of her left neck base.  This patient has a significant past history of a left modified radical mastectomy and Port-A-Cath insertion on 02/21/2011. She underwent adjuvant chemotherapy and adjuvant radiation therapy. This was a triple negative breast cancer, stage T2, N2a, multifocal. She did well. I removed the Port-A-Cath on 10/13/2011. She is being followed by Meliton Rattan and is going to see her in April to talk about delayed reconstruction  She has noticed a lump in her left posterior neck base for about 6 or 7 months. It intermittently swells becomes painful and then settles down. This became painful recently. She was started on Keflex on 02/06/2012. She feels better now.    Operative Findings:       There was a 1.5 cm firm nodule in the left neck, posterior triangle, back over the trapezius muscle. Once I dissected into this mass and found that it was a chronic cyst with purulent fluid within. There was no odor. It was completely debrided. It appeared to be confined to the subcutaneous tissue  Procedure in Detail:          The  patient was brought to room 1 at [facility]  center and placed in a right lateral decubitus position which exposed the left posterior neck well. The left neck was prepped and draped in a sterile fashion. Surgical time out was performed. One percent Xylocaine with epinephrine  was used as local infiltration anesthetic. A straight, elliptical incision was made overlying the palpable mass in the skin crease. Dissection was carried down through the dermis into subcutaneous tissue where I encountered a fluid-filled cyst. This contained whitish creamy fluid but no odor. I completely debrided the entirety of the cyst and cyst wall and sent it to pathology. The wound was irrigated with saline. Because of the potential for infection I closed the wound loosely.   I placed 2 sutures of 4-0 nylon at the edges of the wound and put a 1/4 inch iodoform wick in the middle to promote drainage. Clean bandage was placed. Patient tolerated this well and was returned to the recovery area in excellent condition. EBL 5 cc. Counts correct. Complications none.     Angelia Mould. Derrell Lolling, M.D., FACS General and Minimally Invasive Surgery Breast and Colorectal Surgery  02/19/2012 11:03 AM

## 2012-02-19 NOTE — Telephone Encounter (Signed)
Called patient to advise of follow up appointment to see Dr. Derrell Lolling on 02/27/12 at 1:15. Advised patient to watch for signs of infection and to advise if anything changes. Patient agreed.

## 2012-02-20 ENCOUNTER — Telehealth (INDEPENDENT_AMBULATORY_CARE_PROVIDER_SITE_OTHER): Payer: Self-pay | Admitting: General Surgery

## 2012-02-20 NOTE — Telephone Encounter (Signed)
Called and left message for patient to call back to obtain pathology report information. Per Dr. Derrell Lolling " Tell her this was a benign, infected cyst as we suspected".

## 2012-02-20 NOTE — Telephone Encounter (Signed)
Pt called back; gave her the pathology report.

## 2012-02-20 NOTE — Progress Notes (Signed)
Quick Note:  Inform patient of Pathology report,.Tell her this was a benign, infected cyst as we suspected. ______

## 2012-02-21 ENCOUNTER — Encounter (HOSPITAL_BASED_OUTPATIENT_CLINIC_OR_DEPARTMENT_OTHER): Payer: Self-pay | Admitting: General Surgery

## 2012-02-27 ENCOUNTER — Encounter (INDEPENDENT_AMBULATORY_CARE_PROVIDER_SITE_OTHER): Payer: Self-pay | Admitting: General Surgery

## 2012-02-27 ENCOUNTER — Encounter: Payer: Self-pay | Admitting: *Deleted

## 2012-02-27 ENCOUNTER — Ambulatory Visit (INDEPENDENT_AMBULATORY_CARE_PROVIDER_SITE_OTHER): Payer: Commercial Managed Care - PPO | Admitting: General Surgery

## 2012-02-27 VITALS — BP 118/74 | HR 72 | Temp 97.6°F | Resp 18 | Ht 65.0 in | Wt 191.0 lb

## 2012-02-27 DIAGNOSIS — R221 Localized swelling, mass and lump, neck: Secondary | ICD-10-CM

## 2012-02-27 DIAGNOSIS — R220 Localized swelling, mass and lump, head: Secondary | ICD-10-CM

## 2012-02-27 DIAGNOSIS — E1065 Type 1 diabetes mellitus with hyperglycemia: Secondary | ICD-10-CM | POA: Diagnosis not present

## 2012-02-27 DIAGNOSIS — E7800 Pure hypercholesterolemia, unspecified: Secondary | ICD-10-CM | POA: Diagnosis not present

## 2012-02-27 NOTE — Patient Instructions (Signed)
The lump that we removed from your left neck is a benign cyst. There was no cancer there.  The incision is healing nicely. There is no infection. I removed the sutures today.  You may shower. Wear a Band-Aid for a day or 2 and then leave it open to air.  Be sure to keep your appointment at the cancer Center on February 5.  Be sure to get your right breast mammogram for six-month followup in June 2014  Return to see Dr. Derrell Lolling in July 2014

## 2012-02-27 NOTE — Progress Notes (Signed)
Called and spoke with patient about rescheduling her appt. Confirmed appt. With Magda Bernheim, NP on 03/04/12 at 0900. Patient will become a Dr. Welton Flakes patient.

## 2012-02-27 NOTE — Progress Notes (Signed)
Patient ID: [name], female   DOB: October 21, 1970, 42 y.o.   MRN: [id number] History: This patient was recently  referred back to me by Dr. Ardyth Gal at the [facility] for evaluation of an inflamed cyst of her left neck base.  This patient has a significant past history of a left modified radical mastectomy and Port-A-Cath insertion on 02/21/2011. She underwent adjuvant chemotherapy and adjuvant radiation therapy. This was a triple negative breast cancer, stage T2, N2a, multifocal. She did well. I removed the Port-A-Cath on 10/13/2011. She is being followed by Meliton Rattan and is going to see her in April to talk about delayed reconstruction  She has noticed a lump in her left posterior neck base for about 6 or 7 months. It intermittently swells becomes painful and then settles down. This became painful recently. She was started on Keflex on 02/06/2012. She feels better now. I took her to the operating room on 02/19/2012 and excised the left posterior neck mass. Final pathology shows benign inflammatory process consistent with inclusion-type cyst or folliculitis. No evidence of cancer.  Exam:Patient is well. In no distress. Left neck incision healing normally. No signs of infection. No drainage. Sutures removed. Band Aid placed  Assessment: Inflamed sebaceous cyst left neck, recovering uneventfully following excision. Triple negative breast cancer, left breast, pathologic stage T2, N2a. Doing well one year following left modified radical mastectomy,  Port-A-Cath insertion, adjuvant chemotherapy and adjuvant radiation therapy. BI-RADS category 3 right breast mammogram recently. Six-month followup planned for June  Plan keep the appointment at cancer Center October 5 Right breast mammogram June 2014 Return to see me in July 2014.   Angelia Mould. Derrell Lolling, M.D., [facility] General and Minimally invasive Surgery Breast and Colorectal  Surgery Office:   [phone] Pager:   [phone]

## 2012-02-28 ENCOUNTER — Encounter: Payer: Self-pay | Admitting: Oncology

## 2012-02-29 DIAGNOSIS — E1065 Type 1 diabetes mellitus with hyperglycemia: Secondary | ICD-10-CM | POA: Diagnosis not present

## 2012-02-29 DIAGNOSIS — I10 Essential (primary) hypertension: Secondary | ICD-10-CM | POA: Diagnosis not present

## 2012-03-04 ENCOUNTER — Telehealth: Payer: Self-pay | Admitting: Oncology

## 2012-03-04 ENCOUNTER — Ambulatory Visit (HOSPITAL_BASED_OUTPATIENT_CLINIC_OR_DEPARTMENT_OTHER): Payer: Commercial Managed Care - PPO | Admitting: Family

## 2012-03-04 ENCOUNTER — Ambulatory Visit (HOSPITAL_BASED_OUTPATIENT_CLINIC_OR_DEPARTMENT_OTHER): Payer: Commercial Managed Care - PPO | Admitting: Lab

## 2012-03-04 ENCOUNTER — Other Ambulatory Visit: Payer: Self-pay | Admitting: Emergency Medicine

## 2012-03-04 ENCOUNTER — Encounter: Payer: Self-pay | Admitting: Family

## 2012-03-04 VITALS — BP 128/87 | HR 89 | Temp 98.1°F | Resp 20 | Ht 65.0 in | Wt 188.8 lb

## 2012-03-04 DIAGNOSIS — C50419 Malignant neoplasm of upper-outer quadrant of unspecified female breast: Secondary | ICD-10-CM | POA: Diagnosis not present

## 2012-03-04 DIAGNOSIS — C50912 Malignant neoplasm of unspecified site of left female breast: Secondary | ICD-10-CM

## 2012-03-04 DIAGNOSIS — E559 Vitamin D deficiency, unspecified: Secondary | ICD-10-CM

## 2012-03-04 DIAGNOSIS — C778 Secondary and unspecified malignant neoplasm of lymph nodes of multiple regions: Secondary | ICD-10-CM

## 2012-03-04 DIAGNOSIS — I890 Lymphedema, not elsewhere classified: Secondary | ICD-10-CM | POA: Diagnosis not present

## 2012-03-04 DIAGNOSIS — G609 Hereditary and idiopathic neuropathy, unspecified: Secondary | ICD-10-CM

## 2012-03-04 DIAGNOSIS — C50919 Malignant neoplasm of unspecified site of unspecified female breast: Secondary | ICD-10-CM

## 2012-03-04 LAB — CBC WITH DIFFERENTIAL/PLATELET
BASO%: 0.5 % (ref 0.0–2.0)
Eosinophils Absolute: 0.2 10*3/uL (ref 0.0–0.5)
LYMPH%: 42.2 % (ref 14.0–49.7)
MCHC: 35.3 g/dL (ref 31.5–36.0)
MCV: 83.8 fL (ref 79.5–101.0)
MONO#: 0.2 10*3/uL (ref 0.1–0.9)
MONO%: 5.5 % (ref 0.0–14.0)
NEUT#: 1.8 10*3/uL (ref 1.5–6.5)
RBC: 4.58 10*6/uL (ref 3.70–5.45)
RDW: 12.9 % (ref 11.2–14.5)
WBC: 3.8 10*3/uL — ABNORMAL LOW (ref 3.9–10.3)

## 2012-03-04 LAB — COMPREHENSIVE METABOLIC PANEL (CC13)
ALT: 38 U/L (ref 0–55)
AST: 25 U/L (ref 5–34)
Alkaline Phosphatase: 136 U/L (ref 40–150)
BUN: 12.5 mg/dL (ref 7.0–26.0)
Calcium: 10 mg/dL (ref 8.4–10.4)
Chloride: 106 mEq/L (ref 98–107)
Creatinine: 0.7 mg/dL (ref 0.6–1.1)

## 2012-03-04 NOTE — Progress Notes (Signed)
[facility]  Telephone:(336) [phone] [phone]) [phone]  OFFICE PROGRESS NOTE  PATIENT: [name]   DOB: [birth year]  MR#: [id number]  HQI#:[id number]  CC:OH PARK, Marylene Land, MD Angelia Mould. Derrell Lolling, MD Lurline Hare, MD  DIAGNOSIS: [name] is a 42 year-old, [place] woman who is status post modified radical mastectomy of the left breast on 02/21/2011.  On 01/17/2011 she was diagnosed with triple negative, invasive multifocal, grade 3 ductal carcinoma, 2.4 cm.  High-grade ductal carcinoma in situ was identified. Lymph/vascular invasion identified. 7 of 14 lymph nodes were positive for metastatic carcinoma. Nodule of grade 3 ductal carcinoma associated with ectopic breast tissue present and axillary contents, measuring 0.5 cm. Ki-67 between 78-99%, mpT2, pN2a, MX.   PRIOR THERAPY: 1.  The patient is status post modified radical mastectomy of the left breast on 02/21/2011 with Port-A-Cath placement. 2. The patient is status post radiation therapy from 08/22/2011-10/05/2011. 3. The patient is participating in study NSABP B-49, randomized and is status post chemotherapy with Taxotere and Cytoxan and Neulasta support every 3 weeks for 6 cycles from 03/27/2011 - 07/17/2011.  4.  Right upper extremity venous duplex on 07/23/2011 showed an acute deep vein thrombosis involving the right subclavian and axillary veins the patient was placed on Xarelto 15 mg PO BID. 5.  Right upper extremity venous duplex on 08/10/2011 revealed a subacute deep vein thrombosis involving the right subclavian vein and the patient was placed on Lovenox 100 mg daily.  Previous axillary thrombus appeared resolved. No other thrombosis noted.  Lovenox discontinued on 11/30/2011. 6.  Port-A-Cath removed on 10/13/2011. 7. Genetic testing completed on 01/27/2011 was negative for BRCA1 and BRCA2 mutations, and no large rearrangements were detected.    CURRENT THERAPY: 1.  Left upper extremity  lymphedema, the patient wears a compression sleeve and glove. 2.  Peripheral neuropathy, persistent use of Gabapentin 600 mg PO, TID 3.  Hot flashes, persistent use of Effexor 37.5 mg PO, BID 4.  Insulin pump dependent, DM type I  INTERVAL HISTORY: Dr. Welton Flakes and I saw [name] for follow up of triple negative, invasive, multifocal, grade 3 ductal carcinoma. Since her last office visit with Dr. Donnie Coffin on 11/09/2011 the patient states that she has been doing relatively well.  She has continued peripheral neuropathy in her bilateral hands and feet, in addition to hot flashes.   She had a digital diagnostic right breast mammogram in addition to an ultrasound of the right breast on 01/10/2012.  Both imaging scans showed a faint asymmetric density within the inner right breast without palpable or sonographic correlate which most likely represented a normal breast parenchyma but 6 month follow up was recommended to ensure stability.  [name] denies any other symptomatology during [name]'s visit including any hospitalizations or ER visits since her last office visit, fever, chills, nausea/vomiting/diarrhea or constipation, unusual bleeding, shortness of breath, chest pain or any breast changes.   PAST MEDICAL HISTORY: Past Medical History  Diagnosis Date  . Breast mass in female     fibrocystic changes  . Hyperlipidemia   . Nausea   . Hypertension   . Nausea & vomiting     for approx. 2 weeks  . Cataracts, bilateral   . Diabetes mellitus     insulin dependant - uses pump, Dr. Lucianne [name]  . Breast cancer     T2N2 tiple negative left breast ca   . S/P chemotherapy, time since 4-12 weeks   . History of chemotherapy  taxotere/cytoxan with neulasta support  . Lymphedema of arm     left upper extremity  . Blood clot in vein     nonocclusive clot right subclavian vein, on Xarelto for 6 months  . Allergy   . Breast cancer   . Radiation 08/21/11-10/05/11    Left  chestwll/Supraclav./left PAB/left scar    PAST SURGICAL HISTORY: Past Surgical History  Procedure Date  . Bladder tacking 01/2009  . Shoulder surgery 04/26/10    left  . Tubal ligation 12/1999  . Left mastectomy 02/21/2011  . Portacath placement 02/21/2011    Procedure: INSERTION PORT-A-CATH;  Surgeon: Ernestene Mention, MD;  Location: [facility] OR;  Service: General;  Laterality: N/A;  . Mastectomy modified radical 02/21/11    left,invasive  multifocal grade III ductal ca,high grade dcis,lymph/vascular invasion invasion (7/14) nodes positive mets  . Abdominal hysterectomy 2002    still has ovaries   . Port-a-cath removal 10/13/2011    Procedure: MINOR REMOVAL PORT-A-CATH;  Surgeon: Ernestene Mention, MD;  Location: [facility];  Service: General;  Laterality: Right;  PORT-A CATH REMOVAL  . Mass excision 02/19/2012    Procedure: MINOR EXCISION OF MASS;  Surgeon: Ernestene Mention, MD;  Location: [facility];  Service: General;  Laterality: Left;    GYNECOLOGIC HISTORY: G4 P3, menarche age 34 , and menopause at time of hysterectomy, no history of hormone replacement therapy.    FAMILY HISTORY: Family History  Problem Relation Age of Onset  . Hypertension    . Diabetes    . Deep vein thrombosis    . Kidney disease    . Cancer Maternal Aunt     breast  . Cancer Maternal Uncle 50     right eye removed s/p cancer  . Cancer Maternal Grandfather     prostate ca    SOCIAL HISTORY: History  Substance Use Topics  . Smoking status: Never Smoker   . Smokeless tobacco: Never Used  . Alcohol Use: No  Patient is usually from [facility] she is a stay-at-home mother. Husband works for a Office [employer] and is a Production designer, theatre/television/film. She has 2 sons ages 42 and 42, and a daughter age 32.  The daughter from another relationship. She's been married for 5 years.     ALLERGIES: Allergies  Allergen Reactions  . Hydrocodone-Acetaminophen Itching and Swelling  . Insulin  Glargine Itching, Nausea And Vomiting and Swelling  . Nph Iletin I (Insulin Isophane, Mixed Nph) Itching, Nausea And Vomiting and Swelling     MEDICATIONS:  Current Outpatient Prescriptions  Medication Sig Dispense Refill  . acetaminophen (TYLENOL) 325 MG tablet Take 650 mg by mouth every 6 (six) hours as needed. For pain      . gabapentin (NEURONTIN) 600 MG tablet TAKE ONE TABLET BY MOUTH THREE TIMES DAILY  90 tablet  2  . Insulin Human (INSULIN PUMP) 100 unit/ml SOLN Inject 1 each into the skin continuous. Novolog insulin      . lisinopril (PRINIVIL,ZESTRIL) 10 MG tablet Take 10 mg by mouth daily. For blood pressure       . venlafaxine (EFFEXOR) 37.5 MG tablet Take 37.5 mg by mouth 2 (two) times daily.      Marland Kitchen zolpidem (AMBIEN) 5 MG tablet Take 1 tablet (5 mg total) by mouth at bedtime as needed.  30 tablet  1      REVIEW OF SYSTEMS: A 10 point review of systems was completed and is negative except as noted  above.    PHYSICAL EXAMINATION: BP 128/87  Pulse 89  Temp 98.1 F (36.7 C) (Oral)  Resp 20  Ht 5\' 5"  (1.651 m)  Wt 188 lb 12.8 oz (85.639 kg)  BMI 31.42 kg/m2   General appearance: Alert, cooperative, well nourished, no apparent distress Head: Normocephalic, without obvious abnormality, atraumatic Eyes: PERRLA, EOMI, icteric sclerae Nose: Nares, septum and mucosa are normal, no drainage or sinus tenderness Neck: No adenopathy, supple, symmetrical, trachea midline, thyroid not enlarged, no tenderness Resp: Clear to auscultation bilaterally Cardio: Regular rate and rhythm, S1, S2 normal, no murmur, click, rub or gallop Breasts:  Right breast is soft, no lymphadenopathy, no nipple inversion, no axilla fullness, benign breast exam, left breast is surgically absent, no suspicious findings. GI: Soft, distended, non-tender, hypoactive bowel sounds, no organomegaly, LLQ insulin pump Extremities: Extremities are atraumatic, no cyanosis, LUE covered with compression sleeve and  glove Lymph nodes: Cervical, supraclavicular, and axillary nodes normal Neurologic: Grossly normal    ECOG FS:  Grade 1 - Symptomatic but completely ambulatory   LAB RESULTS: Lab Results  Component Value Date   WBC 3.8* 03/04/2012   NEUTROABS 1.8 03/04/2012   HGB 13.5 03/04/2012   HCT 38.4 03/04/2012   MCV 83.8 03/04/2012   PLT 224 03/04/2012      Chemistry      Component Value Date/Time   NA 144 03/04/2012 1047   NA 135 10/29/2011 0334   K 3.9 03/04/2012 1047   K 4.1 10/29/2011 0334   CL 106 03/04/2012 1047   CL 95* 10/29/2011 0334   CO2 27 03/04/2012 1047   CO2 21 10/29/2011 0334   BUN 12.5 03/04/2012 1047   BUN 22 10/29/2011 0334   CREATININE 0.7 03/04/2012 1047   CREATININE 0.77 10/29/2011 0334      Component Value Date/Time   CALCIUM 10.0 03/04/2012 1047   CALCIUM 10.4 10/29/2011 0334   ALKPHOS 136 03/04/2012 1047   ALKPHOS 187* 10/29/2011 0334   AST 25 03/04/2012 1047   AST 22 10/29/2011 0334   ALT 38 03/04/2012 1047   ALT 38* 10/29/2011 0334   BILITOT 0.48 03/04/2012 1047   BILITOT 1.1 10/29/2011 0334       Lab Results  Component Value Date   LABCA2 42* 02/06/2011     RADIOGRAPHIC STUDIES: No results found.   ASSESSMENT:  [name] is a 42 y.o. [place] with: 1.  She is status post modified radical mastectomy of the left breast on 02/21/2011.  On 01/17/2011 she was diagnosed with triple negative, invasive multifocal, grade 3 ductal carcinoma, 2.4 cm.  High-grade ductal carcinoma in situ was identified. Lymph/vascular invasion identified. 7 of 14 lymph nodes were positive for metastatic carcinoma. Nodule of grade 3 ductal carcinoma associated with ectopic breast tissue present and axillary contents, measuring 0.5 cm. Ki-67 between 78-99%, mpT2, pN2a, MX.   The patient is status post modified radical mastectomy of the left breast on 02/20/2009, status post radiation therapy from 08/22/2011-10/05/2011 and status post chemotherapy with Taxotere and Cytoxan and Neulasta support every 3  weeks for 6 cycles from 03/27/2011 - 07/17/2011.  She is a participant in the NSABP B-49 protocol.   2.  Faint asymmetric density within the inner right breast seen on right breast mammogram and [place] on 01/10/2012.  3.  Left upper extremity lymphedema  4.  Persistent hot flashes   5.  Persistent neuropathy   PLAN: 1.  Labs were drawn [name] and CA 27.29 is pending.  We plan to  see the patient again on 07/15/2012 and labs of CMP, CBC, Vitamin D and CA 27.29 will be obtained at that time.   2.  A six-month repeat of the right breast digital diagnostic mammogram and [place] have been ordered.  3. For the left upper extremity lymphedema, the patient will continue to wear a compression sleeve and glove.  4. For the persistent hot flashes, the patient will continue to use Effexor 37.5 mg PO, BID  5.  For peripheral neuropathy, the patient will continue to use of Gabapentin 600 mg PO, TID.  She has also been asked to take vitamin D3 2000 IUs daily, a  B complex vitamin and to exercise often.   Vitamin D level drawn [name] is pending.   All questions were answered.  The patient was encouraged to contact us in the interim with any problems, questions or concerns.    Larina Bras, NP-C 03/04/2012, 11:53 AM

## 2012-03-04 NOTE — Patient Instructions (Addendum)
Please contact us at [phone] if you have any questions or concerns.  Take vitamin D3 2000 IU daily Take a B complex vitamin daily

## 2012-03-04 NOTE — Telephone Encounter (Signed)
Pt sent back to lb and given appt schedule for June including mammo/US for 6/12. Pt not due for 61month f/u in May but in June per [facility].

## 2012-03-05 LAB — CANCER ANTIGEN 27.29: CA 27.29: 41 U/mL — ABNORMAL HIGH (ref 0–39)

## 2012-03-05 LAB — VITAMIN D 25 HYDROXY (VIT D DEFICIENCY, FRACTURES): Vit D, 25-Hydroxy: 10 ng/mL — ABNORMAL LOW (ref 30–89)

## 2012-03-06 ENCOUNTER — Ambulatory Visit: Payer: Self-pay | Admitting: Oncology

## 2012-03-06 ENCOUNTER — Other Ambulatory Visit: Payer: Self-pay | Admitting: Lab

## 2012-03-16 ENCOUNTER — Other Ambulatory Visit: Payer: Self-pay

## 2012-03-25 ENCOUNTER — Other Ambulatory Visit: Payer: Self-pay | Admitting: *Deleted

## 2012-03-25 DIAGNOSIS — C50912 Malignant neoplasm of unspecified site of left female breast: Secondary | ICD-10-CM

## 2012-03-25 DIAGNOSIS — E1065 Type 1 diabetes mellitus with hyperglycemia: Secondary | ICD-10-CM | POA: Diagnosis not present

## 2012-03-25 DIAGNOSIS — I10 Essential (primary) hypertension: Secondary | ICD-10-CM | POA: Diagnosis not present

## 2012-03-25 MED ORDER — VENLAFAXINE HCL 37.5 MG PO TABS: 37.5000 mg | ORAL_TABLET | Freq: Two times a day (BID) | ORAL | Status: DC

## 2012-03-27 ENCOUNTER — Encounter: Payer: Self-pay | Admitting: Oncology

## 2012-04-02 DIAGNOSIS — E1065 Type 1 diabetes mellitus with hyperglycemia: Secondary | ICD-10-CM | POA: Diagnosis not present

## 2012-05-21 DIAGNOSIS — Z9010 Acquired absence of unspecified breast and nipple: Secondary | ICD-10-CM | POA: Diagnosis not present

## 2012-06-26 ENCOUNTER — Ambulatory Visit (INDEPENDENT_AMBULATORY_CARE_PROVIDER_SITE_OTHER): Payer: Commercial Managed Care - PPO | Admitting: Family Medicine

## 2012-06-26 ENCOUNTER — Encounter: Payer: Self-pay | Admitting: Family Medicine

## 2012-06-26 VITALS — BP 151/85 | HR 92 | Temp 98.5°F | Ht 65.0 in | Wt 196.0 lb

## 2012-06-26 DIAGNOSIS — R6884 Jaw pain: Secondary | ICD-10-CM

## 2012-06-26 MED ORDER — METHOCARBAMOL 500 MG PO TABS: 500.0000 mg | ORAL_TABLET | Freq: Four times a day (QID) | ORAL | Status: DC

## 2012-06-26 MED ORDER — MELOXICAM 15 MG PO TABS: 15.0000 mg | ORAL_TABLET | Freq: Every day | ORAL | Status: DC

## 2012-06-26 NOTE — Assessment & Plan Note (Signed)
May be TMJ syndrome/TMD, however, will need to keep mets on differential due to history of breast cancer.  -Try NSAID, conservative management, prn muscle relaxant (allergic flexeril: nausea) -XR of jaw -Follow-up 2 weeks

## 2012-06-26 NOTE — Progress Notes (Addendum)
  Subjective:    Patient ID: [name], female    DOB: [birth year], 42 y.o.   MRN: [id number]  HPI # Right side of her jaw pops and the left side hurts This has been going on for a couple of weeks now  It hurts her when she talks She denies inciting event  One day she noticed right side started popping  Now pain is on her left side  Constant pain  Progression: worse than initially but persistent and constant recently  Exacerbated by: chewing, eating Alleviated by: nothing  Medications:  -ibuprofen   After agenda setting, decided to defer to talk about below issues. Will f/u 1-2 weeks.   # Decreased energy  # Menopausal symptoms following chemotherapy for breast cancer  She was wondering if other options besides Effexor   Review of Systems Denies hearing problems but does get vibration in alternate ears Denies difficulty swallowing May grind jaws during sleep Denies depression, stressors  Allergies, medication, past medical history reviewed.  Smoking status noted. HLD Hot flash T1 DM Depression Breast cancer s/p treatment 2013    Last appt 6 months ago; next appt next month    She is waiting to get reconstruction surgery    Objective:   Physical Exam GEN: NAD HEENT:   Head: [name]/AT   Eyes: normal conjunctiva without injection or tearing   Nose: no rhinorrhea, normal turbinates   Mouth: MMM; no tonsillar adenopathy; no oropharyngeal erythema   JAW: tenderness along right TMJ; no popping with jaw opening NECK: no LAD PULM: NI WOB PSYCH: pleasant; not anxious or depressed appearing    Assessment & Plan:

## 2012-06-26 NOTE — Patient Instructions (Addendum)
Try the meloxicam daily with food (anti inflammatory)  If youa re stil uncomfortable, try the muscle relaxant (Robaxin)  Follow-up in 2 weeks   Patient information: Temporomandibular joint (TMJ) disorders (The Basics)View in SpanishWritten by the doctors and editors at [employer]  What are temporomandibular joint disorders? - Temporomandibular joint disorders are problems with the jaw joint and the muscles around it. The jaw joint, called the "temporomandibular joint," is located in front of the ear where the jawbone connects to your head. To feel the joint, place your finger on your cheek just in front of your ear and then open and close your mouth. When doctors refer to temporomandibular joint disorders, they often call it "TMJ," for short. TMJ can be caused by many problems, including arthritis. More often it is due to a combination of stress, jaw clenching, teeth grinding, and other things that strain the jaw joint and the muscles around it. What are the symptoms of TMJ? - The main symptom of TMJ is a dull pain in the jaw muscles that doesn't go away. The pain is often on just one side of the face, near the ear. Sometimes the pain also affects the ear, jaw, or back of the neck. It is usually worse when chewing. Some people just have headaches with TMJ. Others might hear a clicking or popping sound or have a "crunchy feeling" in the joint when they open and close their mouth.  Should I see a doctor or nurse? - If the pain in your face or jaw is bothering you and does not go away, you should see your doctor or nurse. What tests might I need? - There is no single test that can show if you have TMJ. Your doctor or nurse should be able to tell if you have TMJ by learning about your symptoms and doing an exam.  Unless the doctor finds something unusual in the exam, most patients will NOT need X-rays or an MRI (an imaging test that creates pictures of the inside of your body).  How is TMJ treated? - No  single treatment for TMJ works for everyone. Most of the time, medicines and simple lifestyle changes can help. Most patients get better over time, even without treatment, so patience is important. Your doctor or nurse will help you find the right mix of treatments for you. He or she might refer you to a dentist who specializes in TMJ. Treatment options include: Medicines to relieve pain and relax the muscles - There are several types of medicines used to treat TMJ. These include nonsteroidal anti-inflammatory drugs (NSAIDs), muscle relaxants, and certain medicines used for depression. (Medicines for depression can relieve pain even in people who are not depressed.) Your doctor will decide which medicine or group of medicines is best for you.  Jaw exercises - There are simple jaw exercises that seem to help some people. Ask your doctor to show you how to do them.  Bite plates/splints - These are special devices that fit in your mouth and keep you from grinding your teeth at night. They are made out of either a hard or soft plastic and might be made specially to fit your mouth. If you have sleep apnea, be sure to tell your doctor as the bite plate or splint might make your sleep apnea worse. If these treatments don't help, your doctor might suggest that you see a specialist, such as an oral Careers adviser. The specialist might use medicines given by injection (shots) to treat the pain.  It is rare that people need surgery for TMJ. Is there anything I can do on my own to feel better? - Yes. You might feel better if you: Avoid doing things that make the pain worse, such opening your mouth too wide.  Eat soft foods that don't require a lot of chewing.  Practice relaxing - You can learn methods to relax your body, such as doing deep breathing exercises. Ask your doctor or nurse about these methods. Relaxing the mind can help with how the body feels pain. People can learn to quiet their pain or make it less bothersome.   Use ice packs to ease the pain - Use a bag of ice, bag of frozen peas, or cold gel pack once every 2 hours, for 20 minutes each time. Put a towel or cloth between the ice and your skin. Do not put the ice directly on your skin.  Put heat on the painful area - Wet a clean washcloth with warm water and put it on the area. When the washcloth cools, reheat it with warm water and put it back on. Repeat these steps for 10 to 15 minutes every few hours.  Avoid stimulants, such as coffee, tea, colas, or decongestant medicines, since these can make your anxiety worse. More on this topic

## 2012-06-27 ENCOUNTER — Ambulatory Visit (HOSPITAL_COMMUNITY)
Admission: RE | Admit: 2012-06-27 | Discharge: 2012-06-27 | Disposition: A | Discharge status: Home / Self Care | Payer: Commercial Managed Care - PPO | Source: Ambulatory Visit | Attending: Family Medicine | Admitting: Family Medicine

## 2012-06-27 ENCOUNTER — Other Ambulatory Visit: Payer: Self-pay | Admitting: Family Medicine

## 2012-06-27 DIAGNOSIS — C50919 Malignant neoplasm of unspecified site of unspecified female breast: Secondary | ICD-10-CM | POA: Diagnosis not present

## 2012-06-27 DIAGNOSIS — Z853 Personal history of malignant neoplasm of breast: Secondary | ICD-10-CM | POA: Diagnosis not present

## 2012-06-27 DIAGNOSIS — R6884 Jaw pain: Secondary | ICD-10-CM | POA: Insufficient documentation

## 2012-06-28 ENCOUNTER — Telehealth: Payer: Self-pay | Admitting: Family Medicine

## 2012-06-28 ENCOUNTER — Encounter (INDEPENDENT_AMBULATORY_CARE_PROVIDER_SITE_OTHER): Payer: Self-pay | Admitting: General Surgery

## 2012-06-28 DIAGNOSIS — R6884 Jaw pain: Secondary | ICD-10-CM

## 2012-06-28 NOTE — Telephone Encounter (Signed)
Notified patient of abnormal x-ray with non-specific lesion right mandibular condyle.  Of note, she reported that right side of her jaw pops but left side has pain.   We will check bone scan to rule-out potential mets.   Patient amenable to plan.   She has been taking meloxicam but reports the discomfort is the same. She has not filled Robaxin yet.   She has follow-up with me 06/11. She was advised to follow-up sooner if pain worsens.

## 2012-06-28 NOTE — Telephone Encounter (Signed)
Bone scan scheduled and pt informed. Arash Karstens, Maryjo Rochester

## 2012-07-04 ENCOUNTER — Encounter (HOSPITAL_COMMUNITY)
Admission: RE | Admit: 2012-07-04 | Discharge: 2012-07-04 | Disposition: A | Discharge status: Home / Self Care | Payer: Commercial Managed Care - PPO | Source: Ambulatory Visit | Attending: Family Medicine | Admitting: Family Medicine

## 2012-07-04 DIAGNOSIS — C50919 Malignant neoplasm of unspecified site of unspecified female breast: Secondary | ICD-10-CM | POA: Diagnosis not present

## 2012-07-04 DIAGNOSIS — R6884 Jaw pain: Secondary | ICD-10-CM | POA: Diagnosis not present

## 2012-07-04 MED ORDER — TECHNETIUM TC 99M MEDRONATE IV KIT
25.0000 | PACK | Freq: Once | INTRAVENOUS | Status: AC | PRN
  Administered 2012-07-04: 25 via INTRAVENOUS

## 2012-07-05 ENCOUNTER — Telehealth: Payer: Self-pay | Admitting: Family Medicine

## 2012-07-05 DIAGNOSIS — R6884 Jaw pain: Secondary | ICD-10-CM

## 2012-07-05 NOTE — Telephone Encounter (Signed)
LVM. Notified normal bone scan. She has follow-up 06/11.  

## 2012-07-05 NOTE — Assessment & Plan Note (Signed)
LVM. Notified normal bone scan. She has follow-up 06/11.

## 2012-07-05 NOTE — Telephone Encounter (Signed)
Patient is calling after having a bone scan yesterday.  She doesn't know how long it will take to get the results, but she would like a call when the results come in.

## 2012-07-05 NOTE — Telephone Encounter (Signed)
Will fwd to Md.  [name], CMA  

## 2012-07-10 ENCOUNTER — Ambulatory Visit (INDEPENDENT_AMBULATORY_CARE_PROVIDER_SITE_OTHER): Payer: Commercial Managed Care - PPO | Admitting: Family Medicine

## 2012-07-10 VITALS — BP 124/80 | HR 86 | Temp 98.2°F | Ht 65.0 in | Wt 195.0 lb

## 2012-07-10 DIAGNOSIS — E1065 Type 1 diabetes mellitus with hyperglycemia: Secondary | ICD-10-CM

## 2012-07-10 DIAGNOSIS — R6884 Jaw pain: Secondary | ICD-10-CM

## 2012-07-10 LAB — POCT GLYCOSYLATED HEMOGLOBIN (HGB A1C): Hemoglobin A1C: 9.5

## 2012-07-10 NOTE — Addendum Note (Signed)
Addended by: Oneal Grout on: 07/10/2012 12:14 PM   Modules accepted: Orders

## 2012-07-10 NOTE — Patient Instructions (Addendum)
Stop meloxicam and see if it changes the pain  Try Robaxin up to 4 times a day Watch out for sleepiness  Try wearing a mouth guard at nighttime   Follow-up in 2 weeks

## 2012-07-10 NOTE — Progress Notes (Signed)
  Subjective:    Patient ID: [name], female    DOB: 06/11/1970, 42 y.o.   MRN: [id number]  HPI # Right sided jaw pain Pain is better but still present intermittently Nothing makes it better or worse   Mobic and Robaxin once daily does not help.   Flexeril causes nausea  She may grind her teeth at nighttime Review of Systems Denies ear pain, difficulty hearing, headache, fevers/chills, nausea, new stressors, sore throat  Allergies, medication, past medical history reviewed.  Smoking status noted.     Objective:   Physical Exam GEN: NAD HEENT: tenderness along left mandible; no swelling, rash, erythema, warmth; no neck LAD; normal neck ROM   MOUTH: several cavities; no dental tenderness; no tonsillar adenopathy; no obvious signs of teeth wearing  PULM: NI WOB     Assessment & Plan:

## 2012-07-10 NOTE — Assessment & Plan Note (Signed)
It seems to be improving but is still significant intermittently when she opens her mouth or grinds her teeth  -Try increasing Robaxin -Change meloxicam to prn  -Try OTC mouth guard because she may grind her teeth at nighttime; she catches herself grinding her teeth sometimes during the day and it makes pain worse  -Consider starting TCA if above therapies do not help; consider referral to dentist for occlusal splints

## 2012-07-11 ENCOUNTER — Ambulatory Visit
Admission: RE | Admit: 2012-07-11 | Discharge: 2012-07-11 | Disposition: A | Discharge status: Home / Self Care | Payer: Commercial Managed Care - PPO | Source: Ambulatory Visit | Attending: Family | Admitting: Family

## 2012-07-11 DIAGNOSIS — C50912 Malignant neoplasm of unspecified site of left female breast: Secondary | ICD-10-CM

## 2012-07-12 ENCOUNTER — Telehealth: Payer: Self-pay | Admitting: Family Medicine

## 2012-07-12 ENCOUNTER — Other Ambulatory Visit: Payer: Self-pay | Admitting: Family Medicine

## 2012-07-12 DIAGNOSIS — E133599 Other specified diabetes mellitus with proliferative diabetic retinopathy without macular edema, unspecified eye: Secondary | ICD-10-CM

## 2012-07-12 DIAGNOSIS — E11319 Type 2 diabetes mellitus with unspecified diabetic retinopathy without macular edema: Secondary | ICD-10-CM | POA: Insufficient documentation

## 2012-07-12 NOTE — Assessment & Plan Note (Signed)
Found on screening 06/2012>>>will refer to ophthalmologist

## 2012-07-12 NOTE — Telephone Encounter (Signed)
Patient returned call to Dr. Madolyn Frieze.  She said that it was pertaining to her eyes.  She does have an eye doctor so she isn't sure if it is ok to go to her own eye doctor or if Dr. Madolyn Frieze wants her to go to a specialist.  She would like Dr. Madolyn Frieze to call her back with clarification.

## 2012-07-12 NOTE — Telephone Encounter (Signed)
Patient is calling because she has priced mouth guard and they are very expensive, so she is hoping for a Rx for one that insurance may cover.  She also needs the results of her A1C.

## 2012-07-12 NOTE — Telephone Encounter (Signed)
See referral . Emer Onnen Dawn  

## 2012-07-12 NOTE — Telephone Encounter (Signed)
Pt given A1C results of 9.5.  Also, patient told to try [facility], she only tried [facility] for mouthguard. They cost from $30-50.  Also, told patient to call medical supply and see if they carry mouth guard.  If Rx needed, we could write her one, but told patient that they are all OTC.  Sargent Mankey, Darlyne Russian, CMA

## 2012-07-12 NOTE — Progress Notes (Signed)
Please refer to ophthalmology due to diabetic retinopathy found on eye exam in clinic. Please fax copy of results with referral (I will give to [facility]), and will you please call patient and notify of appointment?

## 2012-07-15 ENCOUNTER — Other Ambulatory Visit (HOSPITAL_BASED_OUTPATIENT_CLINIC_OR_DEPARTMENT_OTHER): Payer: Commercial Managed Care - PPO | Admitting: Lab

## 2012-07-15 ENCOUNTER — Ambulatory Visit (HOSPITAL_BASED_OUTPATIENT_CLINIC_OR_DEPARTMENT_OTHER): Payer: Commercial Managed Care - PPO | Admitting: Oncology

## 2012-07-15 ENCOUNTER — Telehealth: Payer: Self-pay | Admitting: Oncology

## 2012-07-15 ENCOUNTER — Encounter: Payer: Self-pay | Admitting: Oncology

## 2012-07-15 VITALS — BP 120/74 | HR 89 | Temp 98.0°F | Resp 20 | Ht 65.0 in | Wt 197.3 lb

## 2012-07-15 DIAGNOSIS — R232 Flushing: Secondary | ICD-10-CM | POA: Diagnosis not present

## 2012-07-15 DIAGNOSIS — I972 Postmastectomy lymphedema syndrome: Secondary | ICD-10-CM

## 2012-07-15 DIAGNOSIS — C50919 Malignant neoplasm of unspecified site of unspecified female breast: Secondary | ICD-10-CM

## 2012-07-15 DIAGNOSIS — C50912 Malignant neoplasm of unspecified site of left female breast: Secondary | ICD-10-CM

## 2012-07-15 LAB — CBC WITH DIFFERENTIAL/PLATELET
Basophils Absolute: 0 10*3/uL (ref 0.0–0.1)
Eosinophils Absolute: 0.1 10*3/uL (ref 0.0–0.5)
HGB: 13.6 g/dL (ref 11.6–15.9)
MONO#: 0.2 10*3/uL (ref 0.1–0.9)
NEUT#: 1.9 10*3/uL (ref 1.5–6.5)
RDW: 13 % (ref 11.2–14.5)
lymph#: 1.6 10*3/uL (ref 0.9–3.3)

## 2012-07-15 LAB — COMPREHENSIVE METABOLIC PANEL (CC13)
ALT: 36 U/L (ref 0–55)
CO2: 25 mEq/L (ref 22–29)
Calcium: 9.6 mg/dL (ref 8.4–10.4)
Chloride: 107 mEq/L (ref 98–107)
Potassium: 3.6 mEq/L (ref 3.5–5.1)
Sodium: 141 mEq/L (ref 136–145)
Total Bilirubin: 0.86 mg/dL (ref 0.20–1.20)
Total Protein: 7.2 g/dL (ref 6.4–8.3)

## 2012-07-15 NOTE — Telephone Encounter (Signed)
, °

## 2012-07-15 NOTE — Progress Notes (Signed)
OFFICE PROGRESS NOTE  CC  OH PARK, Terri Land, MD [address] [place] [address] Dr. Claud Kelp Dr. Lurline Hare  DIAGNOSIS: 42 year old female with diagnosis of triple negative invasive ductal carcinoma that was multifocal grade 3 measuring 2.4 cm. 7 of 14 lymph nodes positive diagnosed in January 2013.  PRIOR THERAPY:  #1 patient underwent a screening mammogram in December 2012. She was noted to have a mass and she underwent a diagnostic mammogram and ultrasound on December 18. She had image guided biopsy of one of the masses at the central position that revealed invasive ductal carcinoma. The invasive ductal carcinoma was also noted in the left axillary node. Both tumors were triple negative.  #2 she had MRI of bilateral breasts performed 01/25/2011 that showed 2.4 cm mass in the 12:00 position of the left breast for additional masses were noted left axillary lymph nodes were noted in level I as well mildly prominent left level II retropectoral lymph nodes. There was no evidence of malignancy in the right breast.  #3 patient elected a left mastectomy and axillary lymph node dissection on 02/21/2011. She was found to have multifocal invasive ductal carcinoma, grade 3, there were noted to be 5 foci of disease the largest measuring 2.4 cm. High-grade ductal carcinoma in situ was identified with lymphovascular invasion in the invasive disease. Margins of resection were widely negative with the closest being 1 cm 7 of 14 lymph nodes were positive.  #4 patient went on to have chemotherapy on NSABP B. 49 clinical trial. She was randomized to 6 cycles of Taxotere Cytoxan every 3 weeks with day 2 Neulasta. She completed all of her therapy in July 2013.  #5 she then went on to receive postmastectomy radiation therapy from 08/10/2011 through 10/05/2011.  #6 patient did develop right upper extremity DVT she was placed on several toe 15 mg by mouth twice a day.  #7 patient did  undergo genetic testing on 01/27/2011 which was negative for BRCA1 and BRCA2 gene mutation and no large rearrangements were detected.  CURRENT THERAPY: Observation  INTERVAL HISTORY: [name] 42 y.o. female returns for followup visit today. Overall she seems to be doing well she is continuing to have peripheral neuropathy type of pain. She is also experiencing hot flashes she is on Effexor 37.7 mg twice a day. She is also a known type I diabetic. She does have left upper extremity lymphedema she does wear a lymphedema sleeve. She is otherwise denies any fevers chills night sweats headaches shortness of breath chest pains palpitations she does have some myalgias and minimal arthralgias. She has no nausea or vomiting no Donald pain no diarrhea she has not noticed any masses in her left chest wall. She has not noticed any masses in her right breast. She is a little bit frustrated with not being able to lose weight. She is trying to exercise and eat healthy so that she can eventually undergo reconstruction of the left breast. Remainder of the 10 point review of systems is unremarkable.  MEDICAL HISTORY: Past Medical History  Diagnosis Date  . Breast mass in female     fibrocystic changes  . Hyperlipidemia   . Nausea   . Hypertension   . Nausea & vomiting     for approx. 2 weeks  . Cataracts, bilateral   . Diabetes mellitus     insulin dependant - uses pump, Dr. Lucianne [name]  . Breast cancer     T2N2 tiple negative left breast ca   .  S/P chemotherapy, time since 4-12 weeks   . History of chemotherapy     taxotere/cytoxan with neulasta support  . Lymphedema of arm     left upper extremity  . Blood clot in vein     nonocclusive clot right subclavian vein, on Xarelto for 6 months  . Allergy   . Breast cancer   . Radiation 08/21/11-10/05/11    Left chestwll/Supraclav./left PAB/left scar    ALLERGIES:  is allergic to hydrocodone-acetaminophen; insulin glargine; and nph iletin  i.  MEDICATIONS:  Current Outpatient Prescriptions  Medication Sig Dispense Refill  . Cyanocobalamin (VITAMIN B 12 PO) Take by mouth.      . gabapentin (NEURONTIN) 600 MG tablet TAKE ONE TABLET BY MOUTH THREE TIMES DAILY  90 tablet  2  . Insulin Human (INSULIN PUMP) 100 unit/ml SOLN Inject 1 each into the skin continuous. Novolog insulin      . lisinopril (PRINIVIL,ZESTRIL) 10 MG tablet Take 10 mg by mouth daily. For blood pressure       . meloxicam (MOBIC) 15 MG tablet Take 1 tablet (15 mg total) by mouth daily.  30 tablet  0  . methocarbamol (ROBAXIN) 500 MG tablet Take 1 tablet (500 mg total) by mouth 4 (four) times daily.  30 tablet  0  . venlafaxine (EFFEXOR) 37.5 MG tablet Take 1 tablet (37.5 mg total) by mouth 2 (two) times daily.  60 tablet  3  . VITAMIN D, CHOLECALCIFEROL, PO Take by mouth.      . zolpidem (AMBIEN) 5 MG tablet Take 1 tablet (5 mg total) by mouth at bedtime as needed.  30 tablet  1   No current facility-administered medications for this visit.    SURGICAL HISTORY:  Past Surgical History  Procedure Laterality Date  . Bladder tacking  01/2009  . Shoulder surgery  04/26/10    left  . Tubal ligation  12/1999  . Left mastectomy  02/21/2011  . Portacath placement  02/21/2011    Procedure: INSERTION PORT-A-CATH;  Surgeon: Ernestene Mention, MD;  Location: [facility] OR;  Service: General;  Laterality: N/A;  . Mastectomy modified radical  02/21/11    left,invasive  multifocal grade III ductal ca,high grade dcis,lymph/vascular invasion invasion (7/14) nodes positive mets  . Abdominal hysterectomy  2002    still has ovaries   . Port-a-cath removal  10/13/2011    Procedure: MINOR REMOVAL PORT-A-CATH;  Surgeon: Ernestene Mention, MD;  Location: [facility];  Service: General;  Laterality: Right;  PORT-A CATH REMOVAL  . Mass excision  02/19/2012    Procedure: MINOR EXCISION OF MASS;  Surgeon: Ernestene Mention, MD;  Location: [facility];  Service: General;   Laterality: Left;    REVIEW OF SYSTEMS:  Pertinent items are noted in HPI.   HEALTH MAINTENANCE:   PHYSICAL EXAMINATION: Blood pressure 120/74, pulse 89, temperature 98 F (36.7 C), temperature source Oral, resp. rate 20, height 5\' 5"  (1.651 m), weight 197 lb 4.8 oz (89.495 kg). Body mass index is 32.83 kg/(m^2). ECOG PERFORMANCE STATUS: 0 - Asymptomatic Well-developed well-nourished female in no acute distress HEENT exam EOMI PERRLA sclerae anicteric no conjunctival pallor oral mucosa is moist neck is supple lungs are clear to auscultation cardiovascular is regular rate rhythm abdomen is soft obese nontender nondistended bowel sounds are present no hepatosplenomegaly extremities trace edema neuro patient's alert oriented otherwise nonfocal right breast no masses nipple discharge left chest wall anteriorly no evidence of local recurrence well-healed surgical scar.  LABORATORY DATA: Lab Results  Component Value Date   WBC 3.9 07/15/2012   HGB 13.6 07/15/2012   HCT 39.3 07/15/2012   MCV 85.4 07/15/2012   PLT 231 07/15/2012      Chemistry      Component Value Date/Time   NA 144 03/04/2012 1047   NA 135 10/29/2011 0334   K 3.9 03/04/2012 1047   K 4.1 10/29/2011 0334   CL 106 03/04/2012 1047   CL 95* 10/29/2011 0334   CO2 27 03/04/2012 1047   CO2 21 10/29/2011 0334   BUN 12.5 03/04/2012 1047   BUN 22 10/29/2011 0334   CREATININE 0.7 03/04/2012 1047   CREATININE 0.77 10/29/2011 0334      Component Value Date/Time   CALCIUM 10.0 03/04/2012 1047   CALCIUM 10.4 10/29/2011 0334   ALKPHOS 136 03/04/2012 1047   ALKPHOS 187* 10/29/2011 0334   AST 25 03/04/2012 1047   AST 22 10/29/2011 0334   ALT 38 03/04/2012 1047   ALT 38* 10/29/2011 0334   BILITOT 0.48 03/04/2012 1047   BILITOT 1.1 10/29/2011 0334     ADDITIONAL INFORMATION: 1. PROGNOSTIC INDICATORS - ACIS Results IMMUNOHISTOCHEMICAL AND MORPHOMETRIC ANALYSIS BY THE AUTOMATED CELLULAR IMAGING SYSTEM (ACIS) Estrogen Receptor (Negative, <1%): 0%,  NEGATIVE Progesterone Receptor (Negative, <1%): 0%, NEGATIVE COMMENT: The negative hormone receptor study(ies) in this case have an internal positive control. All controls stained appropriately Pecola Leisure MD Pathologist, Electronic Signature ( Signed 03/01/2011) 1. CHROMOGENIC IN-SITU HYBRIDIZATION Interpretation HER-2/NEU BY CISH - NO AMPLIFICATION OF HER-2 DETECTED. THE RATIO OF HER-2: CEP 17 SIGNALS WAS 1.41. Reference range: Ratio: HER2:CEP17 < 1.8 - gene amplification not observed Ratio: HER2:CEP 17 1.8-2.2 - equivocal result Ratio: HER2:CEP17 > 2.2 - gene amplification observed Pecola Leisure MD Pathologist, Electronic Signature ( Signed 03/01/2011) 1 of 4 FINAL for ARLEN, LEGENDRE ([id number]) FINAL DIAGNOSIS Diagnosis 1. Breast, modified radical mastectomy , left - INVASIVE MULTIFOCAL GRADE III DUCTAL CARCINOMA, LARGEST FOCUS SPANS 2.4 CM. - FOUR OTHER FOCI PRESENT MEASURING 1.2, 1.0, 1.0 AND 0.2 CM - HIGH GRADE DUCTAL CARCINOMA IN SITU IDENTIFIED. - LYMPH/VASCULAR INVASION IS IDENTIFIED. - MARGINS OF MASTECTOMY SPECIMEN ARE NEGATIVE. - SEVEN OF FOURTEEN LYMPH NODES POSITIVE FOR METASTATIC CARCINOMA. - NODULE OF GRADE III DUCTAL CARCINOMA ASSOCIATED WITH ECTOPIC BREAST TISSUE PRESENT IN AXILLARY CONTENTS, MEASURING 0.5 CM. - SEE ONCOLOGY TEMPLATE. 2. Lymph node, biopsy, left axillary, highest level 2 - ONE BENIGN LYMPH NODE WITH NO TUMOR SEEN (0/1). Microscopic Comment 1. BREAST, INVASIVE TUMOR, WITH LYMPH NODE SAMPLING Specimen, including laterality: Left modified radical mastectomy. Procedure: Left modified radial mastectomy. Grade: III (all areas of carcinoma are morphologically similar) Tubule formation: 3 Nuclear pleomorphism: 2 Mitotic: 3 Tumor size (gross measurement and/or glass slide measurement): 2.4 cm, largest focus; 1.2, 1.0, 1.0, and 0.2 cm additional foci. Margins: Invasive, distance to closest margin: 1 cm. In-situ, distance to closest  margin: At least 1 cm. Lymphovascular invasion: Present. Ductal carcinoma in situ: Present. Grade: High grade. Extensive intraductal component: No. Lobular neoplasia: No. Tumor focality: Multifocal Treatment effect: N/A Extent of tumor: Tumor present in breast parenchyma; A second focus is present in ectopic breast tissue present in axillary contents. Lymph nodes: # examined: 16. Lymph nodes with metastasis: 7. Macrometastasis: (> 2.0 mm): 7. Extracapsular extension: Not identified. Breast prognostic profile: 1 o'clock lesion, ([id number]): Estrogen receptor: 0%, negative. Progesterone receptor: 0%, negative. Her 2 neu: 1.36, no amplification. Ki-67: 99%. 12 o'clock (largest lesion), ([id number]): Estrogen receptor: 0%, negative. Progesterone receptor: 0%, negative.  Her 2 neu: 1.25, no amplification. Ki-67: 78%. 2 of 4 FINAL for RONDIA, HIGGINBOTHAM ([id number]) Microscopic Comment(continued) Non-neoplastic breast: Usual ductal hyperplasia present. TNM: mpT2, pN2a, MX. Comments: Immunohistochemical staining is performed on three blocks (1Q, 1S, and 1R) for cytokeratin AE1/AE3 for quantification of the metastatic tumor deposits. All of the tumor foci are morphologically identical and as such, ER, PR and Her-2/neu by CISH will be repeated on the largest tumor focus and reported in an addendum. Additional breast prognostic profiles can be performed on the additional tumor nodules upon request. (RAH:gt, 02/23/11) ROBERT HILLARD  RADIOGRAPHIC STUDIES:  Dg Orthopantogram  06/27/2012   *RADIOLOGY REPORT*  Clinical Data: Jaw pain.  Breast cancer.  ORTHOPANTOGRAM/PANORAMIC  Comparison: 02/13/2011 PET CT  Findings: 0.9 x 0.5 cm lucent lesion along the right mandibular condyle is not readily apparent on the prior PET CT.  Minimal otherwise unremarkable.  IMPRESSION:  1. Sub centimeter lucency in the right mandibular condyle.  This could well represent an erosion or benign  lesion.  Lytic osseous metastatic disease cannot be completely excluded - consider whole- body bone scan.   Original Report Authenticated By: Gaylyn Rong, M.D.   Nm Bone Scan Whole Body  07/04/2012   *RADIOLOGY REPORT*  Clinical Data: Breast cancer.  Jaw pain.  NUCLEAR MEDICINE WHOLE BODY BONE SCINTIGRAPHY  Technique:  Whole body anterior and posterior images were obtained approximately 3 hours after intravenous injection of radiopharmaceutical.  Radiopharmaceutical: CURIE TC-MDP TECHNETIUM TC 71M MEDRONATE IV KIT  Comparison: Orthopantogram dated 06/27/2012.  PET CT scan dated 02/13/2011  Findings: There is no evidence of metastatic disease to the skeleton.  Specifically, the mandible appears normal including the region of the temporomandibular joints bilaterally.  The patient does have a slight thoracolumbar scoliosis.  IMPRESSION: Essentially normal bone scan.  No evidence of metastatic disease. Specifically, the mandible appears normal.   Original Report Authenticated By: Francene Boyers, M.D.   US Breast Right  07/11/2012   *RADIOLOGY REPORT*  Clinical Data:  History of left mastectomy.  Short-term reevaluation right breast for probably benign asymmetry.  DIGITAL DIAGNOSTIC RIGHT MAMMOGRAM WITH CAD AND RIGHT BREAST ULTRASOUND:  Comparison:  01/10/2012, 12/29/2011, 01/26/2011, 01/18/2011, 12/27/2010, 12/13/2009  Findings:  ACR Breast Density Category 2: There is a scattered fibroglandular pattern.  The previously perceived asymmetry in the medial right breast has become less apparent.  It again appears most consistent with overlapping normal glandular tissue.  Mammographic images were processed with CAD.  On physical exam, there are no palpable abnormalities.  Ultrasound is performed, showing no abnormalities in the medial right breast.  IMPRESSION: Overlapping fibroglandular tissue again appreciated in the medial right breast.  It appears less conspicuous than it did on the prior study.   BI-RADS CATEGORY 3:  Probably benign finding(s) - short interval follow-up suggested.  RECOMMENDATION: Diagnostic right mammogram in 6 months.  I have discussed the findings and recommendations with the patient. Results were also provided in writing at the conclusion of the visit.  If applicable, a reminder letter will be sent to the patient regarding the next appointment.   Original Report Authenticated By: Esperanza Heir, M.D.   Mm Digital Diagnostic Unilat R  07/11/2012   *RADIOLOGY REPORT*  Clinical Data:  History of left mastectomy.  Short-term reevaluation right breast for probably benign asymmetry.  DIGITAL DIAGNOSTIC RIGHT MAMMOGRAM WITH CAD AND RIGHT BREAST ULTRASOUND:  Comparison:  01/10/2012, 12/29/2011, 01/26/2011, 01/18/2011, 12/27/2010, 12/13/2009  Findings:  ACR Breast Density Category 2: There is a scattered  fibroglandular pattern.  The previously perceived asymmetry in the medial right breast has become less apparent.  It again appears most consistent with overlapping normal glandular tissue.  Mammographic images were processed with CAD.  On physical exam, there are no palpable abnormalities.  Ultrasound is performed, showing no abnormalities in the medial right breast.  IMPRESSION: Overlapping fibroglandular tissue again appreciated in the medial right breast.  It appears less conspicuous than it did on the prior study.  BI-RADS CATEGORY 3:  Probably benign finding(s) - short interval follow-up suggested.  RECOMMENDATION: Diagnostic right mammogram in 6 months.  I have discussed the findings and recommendations with the patient. Results were also provided in writing at the conclusion of the visit.  If applicable, a reminder letter will be sent to the patient regarding the next appointment.   Original Report Authenticated By: Esperanza Heir, M.D.    ASSESSMENT: 42 year old female with  #1 history of stage II (T2 N2 MX) invasive ductal carcinoma of the left breast patient is status post  mastectomy for multifocal disease and no positive disease largest tumor measured 2.4 cm. Tumor was ER negative PR negative HER-2/neu negative with a Ki-67 of 78%.  #2 postoperatively patient underwent adjuvant chemotherapy on NSABP B. 49 clinical trial. She received Taxotere Cytoxan every 3 weeks for a total of 6 cycles. She completed this in July 2013.  #3 patient then went on to receive adjuvant radiation therapy completing in September 2013. She had been thereafter seeing Dr. Pierce Crane every 3-6 months time.  #4 patient recently had a mammogram performed that was suspicious and therefore she did undergo biopsies in that was negative. She is recommended to have a six-month followup mammogram.   PLAN:   #1 patient will continue to be observed every 6 months now.  #2 we discussed survivorship.  #3 I did discuss exercise and healthy eating with her today. I am hoping that she will be able to do exercise and lose some weight and be about to get reconstruction in the next year or so.  #4 hot flashes she is encouraged to continue the Effexor, we also discussed yoga and tai-chi as well as acupuncture.   All questions were answered. The patient knows to call the clinic with any problems, questions or concerns. We can certainly see the patient much sooner if necessary.  I spent 30 minutes counseling the patient face to face. The total time spent in the appointment was 30 minutes.    Drue Second, MD Medical/Oncology [facility] [phone] (beeper) [phone] (Office)  07/15/2012, 11:23 AM

## 2012-07-15 NOTE — Patient Instructions (Addendum)
Doing well  Follow up in 6 months  We discussed exercise and eating healthy

## 2012-07-16 LAB — CANCER ANTIGEN 27.29: CA 27.29: 42 U/mL — ABNORMAL HIGH (ref 0–39)

## 2012-07-24 ENCOUNTER — Ambulatory Visit (INDEPENDENT_AMBULATORY_CARE_PROVIDER_SITE_OTHER): Payer: Commercial Managed Care - PPO | Admitting: Family Medicine

## 2012-07-24 ENCOUNTER — Encounter: Payer: Self-pay | Admitting: Family Medicine

## 2012-07-24 VITALS — BP 141/88 | HR 88 | Temp 98.2°F | Wt 196.0 lb

## 2012-07-24 DIAGNOSIS — M222X2 Patellofemoral disorders, left knee: Secondary | ICD-10-CM

## 2012-07-24 DIAGNOSIS — M25569 Pain in unspecified knee: Secondary | ICD-10-CM

## 2012-07-24 DIAGNOSIS — R6884 Jaw pain: Secondary | ICD-10-CM

## 2012-07-24 MED ORDER — MELOXICAM 15 MG PO TABS: 15.0000 mg | ORAL_TABLET | Freq: Every day | ORAL | Status: DC

## 2012-07-24 NOTE — Assessment & Plan Note (Signed)
Recurrent right knee pain.  Try meloxicam daily and icing.  ROM and strength is good at this time.  Follow-up 1 month.

## 2012-07-24 NOTE — Assessment & Plan Note (Signed)
Improving Continue current therapy. Follow-up prn.

## 2012-07-24 NOTE — Progress Notes (Signed)
  Subjective:    Patient ID: [name], female    DOB: [birth year], 42 y.o.   MRN: [id number]  HPI # Left jaw pain It is improving She just got mouth brace Robaxin prn may help; she denies significant sedation  She has stopped meloxicam because she did not think she was supposed to take  # Right knee pain x 1.5 weeks. Worse when she gets up after prolonged standing.  She denies inciting events.  She has been more active recently.  ROS: occasional swelling; denies fever, extra warmth  Review of Systems Per HPI  Allergies, medication, past medical history reviewed.  Smoking status noted.     Objective:   Physical Exam GEN: NAD JAW: clicking bilaterally at jaw with pain when mouth is fully opened RIGHT KNEE:    Crepitus   Mild swelling without erythema or warmth   ROM intact   Strength intact including hips   Pes planus    Assessment & Plan:

## 2012-07-24 NOTE — Patient Instructions (Signed)
Jaw pain -Try mouth guard at nighttime -Continue Robaxin (muscle relaxant as needed)  RIGHT knee pain: -Take meloxicam daily with food -Ice 20 minutes end of day -Try getting shoe inserts (orthotics) with arch supports   Patellofemoral Pain Your exam shows your knee pain is probably due to a problem with the knee cap, the patella. This problem is also called patellofemoral pain, runner's knee, or chondromalacia. Most of the time, this problem is due to overuse of the knee joint. Repeated bending and straightening can irritate the underside of the knee cap. When this happens, activities such as running, walking, climbing, biking or jumping usually produce pain. Pain may also occur after prolonged sitting. Other patellofemoral symptoms can include joint stiffness, swelling, and a snapping or grinding sensation with movement. Rest and rehabilitation are usually successful in treating this problem. Surgery is rarely needed. Treatment includes correcting any mechanical factors that could hurt the normal working of the knee. This could be weak thigh muscles or foot problems. Avoid repetitive activities of the knee until the pain and other symptoms improve. Apply ice packs over the knee for 20 to 30 minutes every 2 to 4 hours to reduce pain and swelling. Only take over-the-counter or prescription medicines for pain, discomfort, or fever as directed by your caregiver. Knee braces or neoprene sleeves may help reduce irritation. Rehabilitation exercises to strengthen the quad muscle are often prescribed when your symptoms are better. Call your caregiver for a follow-up exam to evaluate your response to treatment.

## 2012-08-12 ENCOUNTER — Ambulatory Visit (INDEPENDENT_AMBULATORY_CARE_PROVIDER_SITE_OTHER): Payer: Commercial Managed Care - PPO | Admitting: Family Medicine

## 2012-08-12 ENCOUNTER — Other Ambulatory Visit (HOSPITAL_COMMUNITY)
Admission: RE | Admit: 2012-08-12 | Discharge: 2012-08-12 | Disposition: A | Discharge status: Home / Self Care | Payer: Commercial Managed Care - PPO | Source: Ambulatory Visit | Attending: Family Medicine | Admitting: Family Medicine

## 2012-08-12 ENCOUNTER — Encounter: Payer: Self-pay | Admitting: Family Medicine

## 2012-08-12 VITALS — BP 134/83 | HR 93 | Temp 98.4°F | Wt 198.0 lb

## 2012-08-12 DIAGNOSIS — Z1151 Encounter for screening for human papillomavirus (HPV): Secondary | ICD-10-CM | POA: Insufficient documentation

## 2012-08-12 DIAGNOSIS — N83209 Unspecified ovarian cyst, unspecified side: Secondary | ICD-10-CM | POA: Insufficient documentation

## 2012-08-12 DIAGNOSIS — Z124 Encounter for screening for malignant neoplasm of cervix: Secondary | ICD-10-CM

## 2012-08-12 DIAGNOSIS — Z01419 Encounter for gynecological examination (general) (routine) without abnormal findings: Secondary | ICD-10-CM | POA: Insufficient documentation

## 2012-08-12 NOTE — Assessment & Plan Note (Signed)
Will check records to see if there is any history of atypical pap smears, if there are none, will recommend this to be the last pap smear she gets.

## 2012-08-12 NOTE — Assessment & Plan Note (Signed)
PET scan in January 2013 found left sided cyst, likely ovarian. Recommended f/u pelvic ultrasound - Pelvic and transvaginal ultrasound scheduled for 08/16/12

## 2012-08-12 NOTE — Patient Instructions (Signed)
Pelvic and vaginal ultrasound Friday 7/18 at 9:45am  We will call you with the results of the pap smear if they are abnormal, otherwise expect a letter in the mail. Thank you.  Pap Test A Pap test is a procedure done in a clinic office to evaluate cells that are on the surface of the cervix. The cervix is the lower portion of the uterus and upper portion of the vagina. For some women, the cervical region has the potential to form cancer. With consistent evaluations by your caregiver, this type of cancer can be prevented.  If a Pap test is abnormal, it is most often a result of a previous exposure to human papillomavirus (HPV). HPV is a virus that can infect the cells of the cervix and cause dysplasia. Dysplasia is where the cells no longer look normal. If a woman has been diagnosed with high-grade or severe dysplasia, they are at higher risk of developing cervical cancer. People diagnosed with low-grade dysplasia should still be seen by their caregiver because there is a small chance that low-grade dysplasia could develop into cancer.  LET YOUR CAREGIVER KNOW ABOUT:  Recent sexually transmitted infection (STI) you have had.  Any new sex partners you have had.  History of previous abnormal Pap tests results.  History of previous cervical procedures you have had (colposcopy, biopsy, loop electrosurgical excision procedure [LEEP]).  Concerns you have had regarding unusual vaginal discharge.  History of pelvic pain.  Your use of birth control. BEFORE THE PROCEDURE  Ask your caregiver when to schedule your Pap test. It is best not to be on your period if your caregiver uses a wooden spatula to collect cells or applies cells to a glass slide. Newer techniques are not so sensitive to the timing of a menstrual cycle.  Do not douche or have sexual intercourse for 24 hours before the test.   Do not use vaginal creams or tampons for 24 hours before the test.   Empty your bladder just before the  test to lessen any discomfort.  PROCEDURE You will lie on an exam table with your feet in stirrups. A warm metal or plastic instrument (speculum) is placed in your vagina. This instrument allows your caregiver to see the inside of your vagina and look at your cervix. A small, plastic brush or wooden spatula is then used to collect cervical cells. These cells are placed in a lab specimen container. The cells are looked at under a microscope. A specialist will determine if the cells are normal.  AFTER THE PROCEDURE Make sure to get your test results.If your results come back abnormal, you may need further testing.  Document Released: 04/08/2002 Document Revised: 04/10/2011 Document Reviewed: 01/12/2011 [facility] Patient Information 2014 [place], [place].

## 2012-08-12 NOTE — Progress Notes (Signed)
  Subjective:    Patient ID: [name], female    DOB: 01-15-1971, 42 y.o.   MRN: [id number]  HPI 42 y.o. with history of left breast cancer s/p radical mastectomy, type 1 diabetes  # Pap smear today - Most recent several years ago - s/p hysterectomy 2002, Op and path reports requested from [facility] in [facility], cervix removed and ovaries still present   # PET scan in Jan 2013 - Report recommended f/u pelvic ultrasound, patient has not had it done - Patient requesting this now - ROS: no gyn related complaints, vaginal discharge, pelvic pain   Review of Systems    ROS negative except as above Objective:   Physical Exam Filed Vitals:   08/12/12 1350  BP: 134/83  Pulse: 93  Temp: 98.4 F (36.9 C)   General: NAD CV: normal s1/s2, RRR, no M/R/G Resp: CTAB, good inspiratory effort  Pelvic: normal appearing vulva, introitus, scant white mucous, no cervix therefore vaginal cuff was used for sample  [name] and Dr. Dessa Phi were present during the entirety of the exam.       Assessment & Plan:  See problem list documentation

## 2012-08-14 DIAGNOSIS — H35359 Cystoid macular degeneration, unspecified eye: Secondary | ICD-10-CM | POA: Diagnosis not present

## 2012-08-16 ENCOUNTER — Ambulatory Visit (HOSPITAL_COMMUNITY)
Admission: RE | Admit: 2012-08-16 | Discharge: 2012-08-16 | Disposition: A | Discharge status: Home / Self Care | Payer: Commercial Managed Care - PPO | Source: Ambulatory Visit | Attending: Family Medicine | Admitting: Family Medicine

## 2012-08-16 ENCOUNTER — Ambulatory Visit (HOSPITAL_COMMUNITY): Payer: Commercial Managed Care - PPO

## 2012-08-16 DIAGNOSIS — E119 Type 2 diabetes mellitus without complications: Secondary | ICD-10-CM | POA: Diagnosis not present

## 2012-08-16 DIAGNOSIS — N83209 Unspecified ovarian cyst, unspecified side: Secondary | ICD-10-CM

## 2012-08-16 DIAGNOSIS — N9489 Other specified conditions associated with female genital organs and menstrual cycle: Secondary | ICD-10-CM | POA: Insufficient documentation

## 2012-08-16 DIAGNOSIS — C50919 Malignant neoplasm of unspecified site of unspecified female breast: Secondary | ICD-10-CM | POA: Insufficient documentation

## 2012-08-16 DIAGNOSIS — I10 Essential (primary) hypertension: Secondary | ICD-10-CM | POA: Diagnosis not present

## 2012-08-16 DIAGNOSIS — E669 Obesity, unspecified: Secondary | ICD-10-CM | POA: Insufficient documentation

## 2012-08-16 DIAGNOSIS — Z90710 Acquired absence of both cervix and uterus: Secondary | ICD-10-CM | POA: Insufficient documentation

## 2012-08-16 DIAGNOSIS — N859 Noninflammatory disorder of uterus, unspecified: Secondary | ICD-10-CM | POA: Diagnosis not present

## 2012-08-19 ENCOUNTER — Ambulatory Visit (INDEPENDENT_AMBULATORY_CARE_PROVIDER_SITE_OTHER): Payer: Commercial Managed Care - PPO | Admitting: Family Medicine

## 2012-08-19 ENCOUNTER — Encounter: Payer: Self-pay | Admitting: Family Medicine

## 2012-08-19 VITALS — BP 138/83 | HR 82 | Temp 98.2°F | Ht 65.0 in | Wt 197.6 lb

## 2012-08-19 DIAGNOSIS — M222X1 Patellofemoral disorders, right knee: Secondary | ICD-10-CM

## 2012-08-19 DIAGNOSIS — M25569 Pain in unspecified knee: Secondary | ICD-10-CM

## 2012-08-19 MED ORDER — NAPROXEN SODIUM 220 MG PO TABS: 220.0000 mg | ORAL_TABLET | Freq: Two times a day (BID) | ORAL | Status: DC

## 2012-08-19 MED ORDER — ACETAMINOPHEN 500 MG PO TABS: 500.0000 mg | ORAL_TABLET | Freq: Four times a day (QID) | ORAL | Status: DC | PRN

## 2012-08-19 NOTE — Progress Notes (Signed)
  Subjective:    Patient ID: [name], female    DOB: [birth year], 42 y.o.   MRN: [id number]  HPI  # Right Knee pain - Started about 1.5 months ago - Front and back of knee pain, describes as "burning", constantly there - Rates as 3/10 normally and gets up to 9/10 - Worse in morning, better throughout day as she uses it - Meloxicam not helping, also tried 400mg  ibuprofen without relief - Still icing twice a day for 15 minutes - Her right ankle has begun to hurt as well Pain does not radiate down the leg, denies any numbness or weakness Denies any lower leg swelling, fevers/chills, feeling of warm knee  She had a similar episode 1-2 years ago that lasted for 2 months and resolved spontaneously   Review of Systems Negative except as above    Objective:   Physical Exam Filed Vitals:   08/19/12 1330  BP: 138/83  Pulse: 82  Temp: 98.2 F (36.8 C)   General: NAD CV: Normal S1/S2, RRR, no M/R/G Resp: CTAB, good effort Extremities: Right knee: Primarily tender to palpation over patellar tendon, less tenderness on either side of it. Tender over area superior and medial to patella. Also tender behind knee, no obvious Baker's cyst. ROM intact. Mildly swollen compared to left knee. Strength 5/5 bilaterally. Patellar glide test: patella moves very minimally. Gait: patient favors left leg, tries not to bend right knee. Pulses: 2+ DP bilaterally      Assessment & Plan:  See problem list documentation

## 2012-08-19 NOTE — Patient Instructions (Addendum)
Stop taking the meloxicam First try taking the Aleve (naproxen sodium) twice a day with meals If that does not work, try the tylenol (acetaminophen) every 6 hours as needed Continuing icing the knee as tolerated Try the exercises we printed out for you. If the knee pain does not improve with consistent exercises after a month, we can refer you to physical therapy that may be able to give you more targeted strengthening and stretching exercises.  Patellofemoral Pain Your exam shows your knee pain is probably due to a problem with the knee cap, the patella. This problem is also called patellofemoral pain, runner's knee, or chondromalacia. Most of the time, this problem is due to overuse of the knee joint. Repeated bending and straightening can irritate the underside of the knee cap. When this happens, activities such as running, walking, climbing, biking or jumping usually produce pain. Pain may also occur after prolonged sitting. Other patellofemoral symptoms can include joint stiffness, swelling, and a snapping or grinding sensation with movement. Rest and rehabilitation are usually successful in treating this problem. Surgery is rarely needed. Treatment includes correcting any mechanical factors that could hurt the normal working of the knee. This could be weak thigh muscles or foot problems. Avoid repetitive activities of the knee until the pain and other symptoms improve. Apply ice packs over the knee for 20 to 30 minutes every 2 to 4 hours to reduce pain and swelling. Only take over-the-counter or prescription medicines for pain, discomfort, or fever as directed by your caregiver. Knee braces or neoprene sleeves may help reduce irritation. Rehabilitation exercises to strengthen the quad muscle are often prescribed when your symptoms are better. Call your caregiver for a follow-up exam to evaluate your response to treatment. Document Released: 02/24/2004 Document Revised: 04/10/2011 Document Reviewed:  01/16/2005 [facility] Patient Information 2014 [place], [place].

## 2012-08-19 NOTE — Assessment & Plan Note (Signed)
Recurrent knee pain x 1.5 months. Likely patellofemoral. Stretching and strengthening exercises given. Encouraged to do these daily and continue icing as tolerated. Switching from meloxicam to aleve, if aleve does not help she can try tylenol. If pain does not improve within a month consider referral to PT.

## 2012-08-30 ENCOUNTER — Ambulatory Visit (INDEPENDENT_AMBULATORY_CARE_PROVIDER_SITE_OTHER): Payer: Commercial Managed Care - PPO | Admitting: Family Medicine

## 2012-08-30 ENCOUNTER — Other Ambulatory Visit: Payer: Self-pay | Admitting: Family Medicine

## 2012-08-30 ENCOUNTER — Ambulatory Visit (HOSPITAL_COMMUNITY)
Admission: RE | Admit: 2012-08-30 | Discharge: 2012-08-30 | Disposition: A | Discharge status: Home / Self Care | Payer: Commercial Managed Care - PPO | Source: Ambulatory Visit | Attending: Family Medicine | Admitting: Family Medicine

## 2012-08-30 ENCOUNTER — Encounter: Payer: Self-pay | Admitting: Family Medicine

## 2012-08-30 VITALS — BP 129/89 | HR 80 | Temp 98.1°F | Ht 65.0 in | Wt 197.0 lb

## 2012-08-30 DIAGNOSIS — C50919 Malignant neoplasm of unspecified site of unspecified female breast: Secondary | ICD-10-CM | POA: Insufficient documentation

## 2012-08-30 DIAGNOSIS — R05 Cough: Secondary | ICD-10-CM

## 2012-08-30 DIAGNOSIS — R059 Cough, unspecified: Secondary | ICD-10-CM | POA: Diagnosis not present

## 2012-08-30 MED ORDER — CETIRIZINE HCL 10 MG PO TABS: 10.0000 mg | ORAL_TABLET | Freq: Every day | ORAL | Status: DC

## 2012-08-30 MED ORDER — RANITIDINE HCL 150 MG PO TABS: 150.0000 mg | ORAL_TABLET | Freq: Two times a day (BID) | ORAL | Status: DC

## 2012-08-30 NOTE — Patient Instructions (Addendum)
For the cough, I am going to get a chest xray to make sure nothing is going on in your chest.  This could be due to silent allergies and I would like to start you on an antihistamine like zyrtec to see if that helps.

## 2012-09-01 DIAGNOSIS — R05 Cough: Secondary | ICD-10-CM | POA: Insufficient documentation

## 2012-09-01 NOTE — Progress Notes (Signed)
Patient ID: [name]    DOB: 1970/10/24, 42 y.o.   MRN: [id number] --- Subjective:  [name] is a 42 y.o.female with h/o type 1 diabetes, breast cancer: high grade stage 3 invasive ductal carcinoma diagnosed in 12/2010, s/p left radical mastectomy who presents with complaint of cough.  - cough: present for 2 weeks, non productive, dry cough. No shortness of breath or difficulty breathing. Not associated with rhinorrhea or congestion. Some sore throat after coughing a lot. Doesn't keep her up at night. No recent change in lisinopril dose. She has been on the same dose for years. She reports that she sometimes feels some heartburn but not on a regular basis.    ROS: see HPI Past Medical History: reviewed and updated medications and allergies. Social History: Tobacco: none  Objective: Filed Vitals:   08/30/12 1013  BP: 129/89  Pulse: 80  Temp: 98.1 F (36.7 C)    Physical Examination:   General appearance - alert, well appearing, and in no distress, she does have a dry cough present during the visit.  Ears - bilateral TM's and external ear canals normal Nose - mildly congested and erythematous nasal turbinates bilaterally Mouth - mucous membranes moist, no obvious cobblestone pattern or evidence of post nasal drip, but view not completely clear due to prominent tongue.  Neck - supple, no significant adenopathy Chest - clear to auscultation, no wheezes, rales or rhonchi, symmetric air entry Heart - normal rate, regular rhythm, normal S1, S2, no murmurs

## 2012-09-01 NOTE — Assessment & Plan Note (Addendum)
Acute cough for duration of 2 weeks. Differential includes ACEI vs silent GERD vs uper airway cough syndrome vs pulmonary etiology. Patient on lisinopril for many years with no recent dose change. Less likely to be cause. Will not switch to ARB for now. If continues to persist, consider switching. Will treat with ranitidine for possible silent GERD as well as zyrtec if there is any postnasal drip component.  Since patient has recent history of breast cancer, will check CXR to evaluate for any obvious pulmonary metastasis.  Patient to follow up if not better.

## 2012-09-03 ENCOUNTER — Encounter: Payer: Self-pay | Admitting: Family Medicine

## 2012-09-03 ENCOUNTER — Telehealth: Payer: Self-pay | Admitting: Family Medicine

## 2012-09-03 NOTE — Telephone Encounter (Signed)
Called patient and left message letting her know that chest xray was normal. Will also send letter letting her know.   Marena Chancy, PGY-3 Family Medicine Resident

## 2012-09-26 ENCOUNTER — Other Ambulatory Visit: Payer: Self-pay | Admitting: Oncology

## 2012-09-26 ENCOUNTER — Ambulatory Visit (INDEPENDENT_AMBULATORY_CARE_PROVIDER_SITE_OTHER): Payer: Commercial Managed Care - PPO | Admitting: Family Medicine

## 2012-09-26 ENCOUNTER — Encounter: Payer: Self-pay | Admitting: Family Medicine

## 2012-09-26 VITALS — BP 124/80 | HR 70 | Temp 98.2°F | Wt 198.0 lb

## 2012-09-26 DIAGNOSIS — E1065 Type 1 diabetes mellitus with hyperglycemia: Secondary | ICD-10-CM

## 2012-09-26 DIAGNOSIS — M25569 Pain in unspecified knee: Secondary | ICD-10-CM

## 2012-09-26 DIAGNOSIS — M222X1 Patellofemoral disorders, right knee: Secondary | ICD-10-CM

## 2012-09-26 DIAGNOSIS — C50919 Malignant neoplasm of unspecified site of unspecified female breast: Secondary | ICD-10-CM

## 2012-09-26 NOTE — Progress Notes (Signed)
  Subjective:    Patient ID: [name], female    DOB: 10-20-1970, 42 y.o.   MRN: [id number]  HPI  # Right Knee pain - Better, exercises helping. Hasn't done them lately but when she was doing them they were definitely. - Taking tylenol mostly for the pain, not taking every day now but was initially.  # Cough - Better but not completely resolved - Small amount of phlegm coming up, ?color - Taking zantac as prescribed, seems to be helping with symptoms of heartburn  Saw a chiropractor earlier today.   Review of Systems  Constitutional: Negative for appetite change, fatigue and unexpected weight change.  HENT: Negative for trouble swallowing.   Respiratory: Negative for shortness of breath.   Cardiovascular: Negative for chest pain.  Gastrointestinal: Positive for constipation (longstanding). Negative for abdominal pain.  Genitourinary: Negative for difficulty urinating.  Skin: Negative for rash.  Neurological: Negative for dizziness.        Objective:   Physical Exam BP 124/80  Pulse 70  Temp(Src) 98.2 F (36.8 C) (Oral)  Wt 198 lb (89.812 kg)  BMI 32.95 kg/m2  General: NAD HEENT: PERRL, EOMI CV: RRR, normal s1/s2, no m/r/g Resp: CTAB effort normal MSK: Right knee: ROM intact. Tendon snapping on lateral aspect during extension. No obvious deformity Strength 5/5 leg extension/flexion and ankle flexion/extension Neuro: lower extremity sensation grossly intact       Assessment & Plan:  See Problem List documentation

## 2012-09-26 NOTE — Assessment & Plan Note (Signed)
Last A1c 9.5 on 06/2012. Will schedule for follow up in 1-3 months to repeat A1c, foot exam.

## 2012-09-26 NOTE — Assessment & Plan Note (Signed)
Doing well with exercises, says she can definitely tell the difference with them. Taking less pain medications. Continue exercises and meds as prescribed.

## 2012-09-26 NOTE — Patient Instructions (Signed)
It was good to see you again. I'm glad that the knee pain is doing better. Continue the exercises and pain medications as we discussed.   For our next follow up visit we will draw some labs: Hemoglobin A1c to check on your diabetes and a Lipid panel. The lipid panel should be done FASTING, so please avoid eating 3-4 hours before the appointment. This visit can be done sometime in the next 1-3 months.

## 2012-09-27 ENCOUNTER — Other Ambulatory Visit: Payer: Self-pay | Admitting: Plastic Surgery

## 2012-09-27 DIAGNOSIS — Z9012 Acquired absence of left breast and nipple: Secondary | ICD-10-CM

## 2012-09-27 NOTE — H&P (Signed)
This document contains confidential information from a [facility] medical record system and may be unauthenticated. Release may be made only with a valid authorization or in accordance with applicable policies of Medical Center or its affiliates. This document must be maintained in a secure manner or discarded/destroyed as required by Medical Center policy or by a confidential means such as shredding.     [name]  05/21/2012 9:00 AM   Office Visit  MRN:  [id number]  Department:  Plastic Surgery  Dept Phone: [phone]  Description: Female DOB: May 10, 1970  Provider: Wayland Denis, DO    Diagnoses -  Acquired absence of breast, left    -  Primary   V45.71     Reason for Visit -  Breast Reconstruction     Vitals - Last Recorded    139/93  87  1.651 m (5\' 5" )  87.544 kg (193 lb)  32.12 kg/m2       Subjective:    Patient ID: [name] is a 42 y.o. female.  HPI The patient is a 42 yrs old bf here for follow up regarding left breast reconstruction. She went for a routine mammagram which was abnormal followed by U/S, Bx, and MRI. She underwent a left mastectomy with LN dissection. 7 lymph nodes were positive. She has finished chemo and radiation (Sep 2013). She is 5 feet 5 inches tall and 183 pounds. Her current bra on the right is a 40D. She is an insulin dependent diabetic with a pump in her abdomen. The incision is well healed but she does have radiation darkening of the skin. The prosthesis is very heavy. She is trying to loss weight and is not using tobacco.  The following portions of the patient's history were reviewed and updated as appropriate: allergies, current medications, past family history, past medical history, past social history, past surgical history and problem list.  Review of Systems  Constitutional: Negative.   HENT: Negative.   Eyes: Negative.   Respiratory: Negative.   Cardiovascular: Negative.   Gastrointestinal: Negative.    Endocrine: Negative.   Genitourinary: Negative.   Neurological: Negative.   Hematological: Negative.   Psychiatric/Behavioral: Negative.      Objective:   Physical Exam  Constitutional: She is oriented to person, place, and time. She appears well-developed and well-nourished.  HENT:   Head: Normocephalic and atraumatic.  Eyes: Conjunctivae and EOM are normal. Pupils are equal, round, and reactive to light.  Cardiovascular: Normal rate.   Pulmonary/Chest: Effort normal.  Abdominal: Soft. She exhibits no distension. There is no tenderness.  Musculoskeletal: Normal range of motion.  Neurological: She is alert and oriented to person, place, and time.  Skin: Skin is warm.  Psychiatric: She has a normal mood and affect. Her behavior is normal. Judgment and thought content normal.   Assessment:   1.  Acquired absence of breast, left        Plan:     Assessment and Plan:   A long, detailed conversation was had regarding the patient's options for breast reconstruction. Five main points, which are explained to all breast reconstruction patients, were discussed.   1. Breast reconstruction is an optional process.   2. Breast reconstruction is a multi-stage process which involves multiple surgeries spaced several months apart. The entire process can take over one year.   3. The major goal of breast reconstruction is to have the patient look normal in clothing. When naked, there will always be scars.   4. Asymmetries  are often present during the reconstruction process. Several operations may be needed, including surgery to the non-cancerous breast, to achieve satisfactory results.   5. No matter the reconstructive method, there are ways that the reconstruction can fail and a secondary reconstructive plan would need to be created.   A general discussion regarding all available methods of breast reconstruction were discussed. The types of reconstructions described included.   1. Tissue expander  and implant based reconstruction, both single and multi-stage approaches.   2. Autologous only reconstructions, including free abdominal-tissue based reconstructions.   3. Combination procedures, particularly latissismus dorsi flaps combined with either expanders or implants.   For each of the reconstruction methods mentioned above, the risks, benefits, alternatives, scarring, and recovery time were discussed in great detail. Specific risks detailed included bleeding, infection, hematoma, seroma, scarring, pain, wound healing complications, flap loss, fat necrosis, capsular contracture, need for implant removal, donor site complications, bulge, hernia, umbilical necrosis, need for urgent reoperation, and need for dressing changes were discussed.   Assessment   Once all reconstruction options were presented, a focused discussion was had regarding the patient's suitability for each of these procedures.   A total of 50 minutes of face-to-face time was spent in this encounter, of which >50% was spent in counseling. She is a reasonable candidate for a left latissimus/expander reconstruction followed by a right reduction/mastopexy at the time of the left expander exchange for the implant.   We will need her HgA1C prior to surgery.

## 2012-10-01 ENCOUNTER — Encounter (HOSPITAL_COMMUNITY): Payer: Self-pay

## 2012-10-01 DIAGNOSIS — Z9012 Acquired absence of left breast and nipple: Secondary | ICD-10-CM | POA: Insufficient documentation

## 2012-10-04 ENCOUNTER — Encounter (HOSPITAL_COMMUNITY): Payer: Self-pay

## 2012-10-04 ENCOUNTER — Encounter (HOSPITAL_COMMUNITY)
Admission: RE | Admit: 2012-10-04 | Discharge: 2012-10-04 | Disposition: A | Discharge status: Home / Self Care | Payer: Commercial Managed Care - PPO | Source: Ambulatory Visit | Attending: Plastic Surgery | Admitting: Plastic Surgery

## 2012-10-04 VITALS — BP 108/73 | HR 85 | Temp 98.1°F | Resp 20 | Ht 65.0 in | Wt 201.3 lb

## 2012-10-04 DIAGNOSIS — Z01810 Encounter for preprocedural cardiovascular examination: Secondary | ICD-10-CM | POA: Insufficient documentation

## 2012-10-04 DIAGNOSIS — Z9012 Acquired absence of left breast and nipple: Secondary | ICD-10-CM

## 2012-10-04 DIAGNOSIS — Z01818 Encounter for other preprocedural examination: Secondary | ICD-10-CM | POA: Insufficient documentation

## 2012-10-04 DIAGNOSIS — Z01812 Encounter for preprocedural laboratory examination: Secondary | ICD-10-CM | POA: Insufficient documentation

## 2012-10-04 LAB — BASIC METABOLIC PANEL
BUN: 12 mg/dL (ref 6–23)
CO2: 27 mEq/L (ref 19–32)
Calcium: 9.6 mg/dL (ref 8.4–10.5)
Creatinine, Ser: 0.66 mg/dL (ref 0.50–1.10)
Glucose, Bld: 451 mg/dL — ABNORMAL HIGH (ref 70–99)

## 2012-10-04 LAB — CBC
MCH: 29.7 pg (ref 26.0–34.0)
MCHC: 36.6 g/dL — ABNORMAL HIGH (ref 30.0–36.0)
MCV: 81.1 fL (ref 78.0–100.0)
Platelets: 230 10*3/uL (ref 150–400)
RDW: 12.6 % (ref 11.5–15.5)

## 2012-10-04 NOTE — Progress Notes (Signed)
Inpatient Diabetes Program Recommendations  AACE/ADA: New Consensus Statement on Inpatient Glycemic Control (2013)  Target Ranges:  Prepandial:   less than 140 mg/dL      Peak postprandial:   less than 180 mg/dL (1-2 hours)      Critically ill patients:  140 - 180 mg/dL     Called by RN in Pre-admissions unit today around 11:30am.  Was informed that Ms. Grieco would be coming to [facility] for breast surgery on 10/14/12.  Ms. Mishkin has a history of DM and uses an insulin pump at home to control her CBGs.    Per RN, the surgery patient will undergo is ~4.5 hours in length.  RN was unsure whether patient has Type 1 DM or Type 2 DM.  Per RN, patient stated her MD told her (the patient) to reduce her basal rates on her pump by 1/2 when she was last here for surgery.  Noted in records, that patient has an allergy to NPH insulin, 70/30 insulin, and Lantus insulin.  **MD- Due to the length of the scheduled surgery, recommend patient remove her insulin pump in pre-op on the day of surgery (09/15) and recommend that we immediately initiate an IV insulin drip with the GlucoStabilizer for the duration of patient's surgery.  Once patient is post-op and alert and oriented, we can have patient resume her insulin pump and turn the IV insulin drip off once pump has been restarted for 1-2 hours.  ([facility] Insulin pump policy states that insulin pumps are not recommended to be used during surgeries lasting greater than 1 hour.  [facility] insulin pump policy recommends use of IV insulin drip instead for lengthy surgeries.)    Please use IV Insulin/GlucoStabilizer order set found in [facility].   Will follow. Ambrose Finland RN, MSN, CDE Diabetes Coordinator Inpatient Diabetes Program [phone]

## 2012-10-04 NOTE — Progress Notes (Signed)
Diabetes Coordinator notified of admission date for planned surgery.  Pt. States that PCP had told her to cut her basal rate to 1/2 prior to surgery.

## 2012-10-04 NOTE — Pre-Procedure Instructions (Addendum)
Lyne S Landrum-Zaragoza  10/04/2012   Your procedure is scheduled on:  10-14-2012   Monday   Report to [facility] at 10:00 AM.   Call this number if you have problems the morning of surgery: [phone]   Remember:   Do not eat food or drink liquids after midnight.    Take these medicines the morning of surgery with A SIP OF WATER: certirzine(Zyrtec),gabapentin(Neurontin),ranitidine(Zantac),venlafaxine(Effexor)                Keep insulin pump on Basal rate.     Do not wear jewelry, make-up or nail polish.  Do not wear lotions, powders, or perfumes.   Do not shave 48 hours prior to surgery.   Do not bring valuables to the hospital.  [facility] is not responsible for any belongings or valuables.  Contacts, dentures or bridgework may not be worn into surgery.   Leave suitcase in the car. After surgery it may be brought to your room.   For patients admitted to the hospital, checkout time is 11:00 AM the day of discharge.   Patients discharged the day of surgery will not be allowed to drive home.    Special Instructions: Shower using CHG 2 nights before surgery and the night before surgery.  If you shower the day of surgery use CHG.  Use special wash - you have one bottle of CHG for all showers.  You should use approximately 1/3 of the bottle for each shower.   Please read over the following fact sheets that you were given: Pain Booklet, Coughing and Deep Breathing and Surgical Site Infection Prevention

## 2012-10-07 ENCOUNTER — Encounter (HOSPITAL_COMMUNITY): Payer: Self-pay | Admitting: Vascular Surgery

## 2012-10-07 NOTE — Progress Notes (Addendum)
Anesthesia Chart Review:  Patient is a 42 year old female scheduled for left latissimus flap with expander placement for left breast reconstruction on 10/14/12 by Dr. Kelly Splinter.  She has a history of left breast cancer s/p left modified radical mastectomy, insertion of a Port-a-cath on 02/21/11 with removal of PAC on 10/13/11 following chemoradiation. Her other history includes HLD, DM with an insulin pump, cataracts, HTN, and hysterectomy, obesity, non-smoker, LUE lymphedema, right SCV non-occlusive DVT 07/2011 s/p Xarelto for six months. PCP is listed as Dr. Marney Setting with [facility].  EKG on 10/04/12 showed showed NSR .  Echo on 03/07/11 showed:  - Left ventricle: The cavity size was normal. Systolic function was normal. The estimated ejection fraction was in the range of 55% to 60%. Wall motion was normal; there were no regional wall motion abnormalities. Left ventricular diastolic function parameters were normal. - Atrial septum: No defect or patent foramen ovale was Identified. - Trivial mitral regurgitation.  Mild tricuspid regurgitation. Impressions: Normal study. Normal pulmonary artery pressure.  CXR on 08/30/12 showed no acute cardiopulmonary abnormality.   Labs reviewed. Glucose on 10/04/12 was 451.  A1C was 9.5 on 07/10/12.  Glucose result called to [place] at Dr. Leonie Green office, and she will forward to Dr. Kelly Splinter to review.  I will attempt to contact patient to inquire further about overall glucose control.  Her previous glucose results in Epic since 10/2011 have ranged from 57, 150, to 370.  Velna Ochs [facility] Stay [facility]/Anesthesiology Phone [phone] 10/07/2012 4:00 PM  Addendum: 10/08/2012 3:00 PM I have not spoken with patient about her DM control, but did note that Dr. Kelly Splinter has since canceled patient's procedure.

## 2012-10-14 ENCOUNTER — Encounter (HOSPITAL_COMMUNITY): Admission: RE | Payer: Self-pay | Source: Ambulatory Visit

## 2012-10-14 ENCOUNTER — Inpatient Hospital Stay (HOSPITAL_COMMUNITY)
Admission: RE | Admit: 2012-10-14 | Payer: Commercial Managed Care - PPO | Source: Ambulatory Visit | Admitting: Plastic Surgery

## 2012-10-14 SURGERY — RECONSTRUCTION, BREAST, USING LATISSIMUS DORSI MYOCUTANEOUS FLAP
Anesthesia: General | Site: Breast | Laterality: Left

## 2012-10-25 ENCOUNTER — Ambulatory Visit (INDEPENDENT_AMBULATORY_CARE_PROVIDER_SITE_OTHER): Payer: Commercial Managed Care - PPO | Admitting: Endocrinology

## 2012-10-25 ENCOUNTER — Encounter: Payer: Self-pay | Admitting: Endocrinology

## 2012-10-25 VITALS — BP 122/70 | HR 73 | Temp 98.5°F | Resp 12 | Ht 65.0 in | Wt 204.0 lb

## 2012-10-25 DIAGNOSIS — E1065 Type 1 diabetes mellitus with hyperglycemia: Secondary | ICD-10-CM | POA: Diagnosis not present

## 2012-10-25 DIAGNOSIS — R5383 Other fatigue: Secondary | ICD-10-CM

## 2012-10-25 DIAGNOSIS — R5381 Other malaise: Secondary | ICD-10-CM | POA: Diagnosis not present

## 2012-10-25 DIAGNOSIS — E785 Hyperlipidemia, unspecified: Secondary | ICD-10-CM | POA: Diagnosis not present

## 2012-10-25 DIAGNOSIS — IMO0002 Reserved for concepts with insufficient information to code with codable children: Secondary | ICD-10-CM

## 2012-10-25 LAB — URINALYSIS, ROUTINE W REFLEX MICROSCOPIC
Bilirubin Urine: NEGATIVE
Hgb urine dipstick: NEGATIVE
Ketones, ur: NEGATIVE
Nitrite: NEGATIVE
Total Protein, Urine: NEGATIVE

## 2012-10-25 LAB — HEMOGLOBIN A1C: Hgb A1c MFr Bld: 9.3 % — ABNORMAL HIGH (ref 4.6–6.5)

## 2012-10-25 LAB — COMPREHENSIVE METABOLIC PANEL
ALT: 39 U/L — ABNORMAL HIGH (ref 0–35)
AST: 31 U/L (ref 0–37)
BUN: 12 mg/dL (ref 6–23)
Creatinine, Ser: 0.7 mg/dL (ref 0.4–1.2)
GFR: 127.34 mL/min (ref >60.00)
Total Bilirubin: 0.7 mg/dL (ref 0.3–1.2)

## 2012-10-25 LAB — LIPID PANEL
Cholesterol: 194 mg/dL (ref 0–200)
HDL: 80.8 mg/dL (ref >39.00)
LDL Cholesterol: 104 mg/dL — ABNORMAL HIGH (ref 0–99)
VLDL: 9.4 mg/dL (ref 0.0–40.0)

## 2012-10-25 LAB — MICROALBUMIN / CREATININE URINE RATIO
Microalb Creat Ratio: 2.1 mg/g (ref 0.0–30.0)
Microalb, Ur: 1.9 mg/dL (ref 0.0–1.9)

## 2012-10-25 LAB — T4, FREE: Free T4: 0.91 ng/dL (ref 0.60–1.60)

## 2012-10-25 NOTE — Patient Instructions (Addendum)
Change basals as follows: Midnight = 1.5, 4 AM = 2.0, 6 AM = 2.6, 9 AM = 2.4, 1 PM = 2.2. Continue 2.8 at 6 PM and 1.6 at 10 PM Carbohydrate coverage 1:15 g Please check blood sugars before meals and at least half the time about 2 hours after any meal upto 4x daily.  More consistent bolusing for all carbohydrate intake, may bolus right after eating when blood sugar is low

## 2012-10-25 NOTE — Progress Notes (Addendum)
Patient ID: [name], female   DOB: [birth year], 42 y.o.   MRN: [id number]  [name] is an 42 y.o. female.   Reason for Appointment: Insulin Pump followup:   History of Present Illness   Diagnosis: Type 1 DIABETES MELITUS, date of diagnosis: 2002    DIABETES history:   Previous history: She generally has had poor control and A1c in the past has been consistently over 8% despite using insulin pump since 2005. This is partly related to her lack of motivation to check blood sugars consistently and also because of variability in her blood sugars and periods of hyperglycemia and hypoglycemia. Also not consistently bolusing for meals and high sugars  Generally has been irregular with her followup  RECENT history: not clear why her blood sugars are fluctuating significantly. She has had readings in the  300 -500 range occasionally but most recently has had tendency to frequent hypoglycemia between about 7 AM-5 PM. Also recently checking blood sugars mostly when she feels hypoglycemic This may be related to her starting a regular exercise program and also cutting back on portions for weight loss Hyperglycemia: she has periodic high readings and evenings, occasionally following a low sugar Her compliance with glucose monitoring is still poor and inconsistent  Problems identified:  Not checking blood sugars enough and none after meals Hypoglycemia is much more frequent, possibly related to change in diet and exercise regimen Probably not bolusing for meals as indicated especially in the morning when blood sugar may be low Overall poor control as judged by A1c of 9.5 in June  CURRENT insulin pump: 2005 One Touch Ping  DIET: Cutting back on portions recently, [place] meat at lunch   The pump SETTINGS are: Basal rate: Midnight = 1.6.  4 a.m =. 2.2. 8 a.m. = 1.0. 9:30 a.m. = 2.9. 3 p.m. = 2.4 and 9 p.m. = 1.7. Carbohydrate coverage 1:12 and sensitivity 1: 30 with target 120  GLUCOSE  CONTROL with the pump is assessed today by pump download.  Fasting glucose 48-278, mostly low at about 8 AM. Has mostly low blood sugars between 12 noon and 4 PM, 5 PM-7 PM 246-509 with only occasional readings Pump management: She is getting a total of about 55 units from basal amount of boluses range from 0-25%, mostly very low amounts  HYPOGLYCEMIC episodes: more now, gets shaky with low sugars  Lab Results  Component Value Date   HGBA1C 9.5 07/10/2012    EXERCISE: Walking dog, going to Gym  for the last few weeks, exercises at 8-9 am  MICROALBUMIN has been tested, and the result is not available from the last visit      Medication List       This list is accurate as of: 10/25/12  2:14 PM.  Always use your most recent med list.               acetaminophen 500 MG tablet  Commonly known as:  TYLENOL  Take 1 tablet (500 mg total) by mouth every 6 (six) hours as needed for pain.     cetirizine 10 MG tablet  Commonly known as:  ZYRTEC  Take 1 tablet (10 mg total) by mouth daily.     Cinnamon 500 MG Tabs  Take 1,000 mg by mouth 2 (two) times daily.     gabapentin 600 MG tablet  Commonly known as:  NEURONTIN  TAKE ONE TABLET BY MOUTH THREE TIMES DAILY     insulin pump 100 unit/ml  Soln  Inject 1 each into the skin continuous. Novolog insulin     lisinopril 10 MG tablet  Commonly known as:  PRINIVIL,ZESTRIL  Take 10 mg by mouth every morning. For blood pressure     methocarbamol 500 MG tablet  Commonly known as:  ROBAXIN     naproxen sodium 220 MG tablet  Commonly known as:  ANAPROX  Take 1 tablet (220 mg total) by mouth 2 (two) times daily with a meal.     ranitidine 150 MG tablet  Commonly known as:  ZANTAC  Take 1 tablet (150 mg total) by mouth 2 (two) times daily.     venlafaxine 37.5 MG tablet  Commonly known as:  EFFEXOR  Take 37.5 mg by mouth 2 (two) times daily.     VITAMIN B 12 PO  Take by mouth.     VITAMIN D (CHOLECALCIFEROL) PO  Take by mouth.         Allergies:  Allergies  Allergen Reactions  . Hydrocodone-Acetaminophen Itching and Swelling  . Insulin Glargine Itching, Nausea And Vomiting and Swelling  . Nph Iletin I [Insulin Isophane, Mixed Nph] Itching, Nausea And Vomiting and Swelling    Past Medical History  Diagnosis Date  . Breast mass in female     fibrocystic changes  . Hyperlipidemia   . Nausea   . Hypertension   . Nausea & vomiting     for approx. 2 weeks  . Cataracts, bilateral   . Diabetes mellitus     insulin dependant - uses pump, Dr. Lucianne [name]  . Breast cancer     T2N2 tiple negative left breast ca   . S/P chemotherapy, time since 4-12 weeks   . History of chemotherapy     taxotere/cytoxan with neulasta support  . Lymphedema of arm     left upper extremity  . Blood clot in vein     nonocclusive clot right subclavian vein, on Xarelto for 6 months  . Allergy   . Breast cancer   . Radiation 08/21/11-10/05/11    Left chestwll/Supraclav./left PAB/left scar    Past Surgical History  Procedure Laterality Date  . Bladder tacking  01/2009  . Shoulder surgery  04/26/10    left  . Tubal ligation  12/1999  . Left mastectomy  02/21/2011  . Portacath placement  02/21/2011    Procedure: INSERTION PORT-A-CATH;  Surgeon: Ernestene Mention, MD;  Location: [facility] OR;  Service: General;  Laterality: N/A;  . Mastectomy modified radical  02/21/11    left,invasive  multifocal grade III ductal ca,high grade dcis,lymph/vascular invasion invasion (7/14) nodes positive mets  . Port-a-cath removal  10/13/2011    Procedure: MINOR REMOVAL PORT-A-CATH;  Surgeon: Ernestene Mention, MD;  Location: [facility];  Service: General;  Laterality: Right;  PORT-A CATH REMOVAL  . Mass excision  02/19/2012    Procedure: MINOR EXCISION OF MASS;  Surgeon: Ernestene Mention, MD;  Location: [facility];  Service: General;  Laterality: Left;  . Abdominal hysterectomy  2002    still has ovaries     Family History   Problem Relation Age of Onset  . Hypertension    . Diabetes    . Deep vein thrombosis    . Kidney disease    . Cancer Maternal Aunt     breast  . Cancer Maternal Uncle 50     right eye removed s/p cancer  . Cancer Maternal Grandfather     prostate ca  Social History:  reports that she has never smoked. She has never used smokeless tobacco. She reports that she does not drink alcohol or use illicit drugs.  ROS:   She has history of hyperlipidemia, currently not on medication History of mild hypertension well controlled with lisinopril History of depression treated with Effexor Complains of cold intolerance at times but also hot flashes. Thyroid levels were normal in 2012 She did have hair loss from chemotherapy, now much improved History of vitamin D deficiency  EXAM:  BP 122/70  Pulse 73  Temp(Src) 98.5 F (36.9 C)  Resp 12  Ht 5\' 5"  (1.651 m)  Wt 204 lb (92.534 kg)  BMI 33.95 kg/m2  SpO2 99%  No ankle edema  ASSESSMENT:  DIABETES type 1: She generally has had poor control and not clear why her blood sugars are fluctuating significantly. She has had readings in the fall 100-500 range occasionally but most recently has had tendency to frequent hypoglycemia between about 7 AM-5 PM This may be related to her starting a regular exercise program However she has periodic high readings and evenings, occasionally following a low sugar Her compliance with glucose monitoring is still poor and recently checking blood sugars mostly when she is low  Problems identified:  Not checking blood sugars enough and none after meals Hypoglycemia is much more frequent, possibly related to change in diet and exercise regimen Probably not bolusing for meals as indicated especially in the morning when blood sugar may be low Overall poor control as judged by A1c of 9.5 in June Irregular followup  PLAN: Will reduce her basal rates between 4 AM-6 PM especially in the mornings when she is  the lowest New basal rates:Midnight = 1.5  4 a.m = 2.0, 6 AM = 2.6,. 9 AM = 2.4,. 1 p.m. = 2.2 , 6 PM = 2.8 and 8 PM = 1.6. Reduce carbohydrate coverage to 1:15 She will need to check blood sugars as directedIncluding after meals  Check thyroid levels to rule out hypothyroidism as cause of hypoglycemia and her fatigue and weight gain More consistent bolusing for all carbohydrate intake, may bolus right after eating when blood sugar is low Followup in one month for review Of pump management and blood sugar control  Counseling time over 50% of today's 25 minute visit  Kyley Laurel 10/25/2012, 2:14 PM   Addendum: Labs as follows  Office Visit on 10/25/2012  Component Date Value Range Status  . Sodium 10/25/2012 139  135 - 145 mEq/L Final  . Potassium 10/25/2012 4.0  3.5 - 5.1 mEq/L Final  . Chloride 10/25/2012 105  96 - 112 mEq/L Final  . CO2 10/25/2012 28  19 - 32 mEq/L Final  . Glucose, Bld 10/25/2012 120* 70 - 99 mg/dL Final  . BUN 86/57/8469 12  6 - 23 mg/dL Final  . Creatinine, Ser 10/25/2012 0.7  0.4 - 1.2 mg/dL Final  . Total Bilirubin 10/25/2012 0.7  0.3 - 1.2 mg/dL Final  . Alkaline Phosphatase 10/25/2012 157* 39 - 117 U/L Final  . AST 10/25/2012 31  0 - 37 U/L Final  . ALT 10/25/2012 39* 0 - 35 U/L Final  . Total Protein 10/25/2012 7.3  6.0 - 8.3 g/dL Final  . Albumin 62/95/2841 3.8  3.5 - 5.2 g/dL Final  . Calcium 32/44/0102 9.3  8.4 - 10.5 mg/dL Final  . GFR 72/53/6644 127.34  >60.00 mL/min Final  . TSH 10/25/2012 0.85  0.35 - 5.50 uIU/mL Final  . Free T4 10/25/2012  0.91  0.60 - 1.60 ng/dL Final  . Hemoglobin Q4O 10/25/2012 9.3* 4.6 - 6.5 % Final   Glycemic Control Guidelines for People with Diabetes:Non Diabetic:  <6%Goal of Therapy: <7%Additional Action Suggested:  >8%   . Microalb, Ur 10/25/2012 1.9  0.0 - 1.9 mg/dL Final  . Creatinine,U 96/29/5284 91.8   Final  . Microalb Creat Ratio 10/25/2012 2.1  0.0 - 30.0 mg/g Final  . Color, Urine 10/25/2012 LT. YELLOW   Yellow;Lt. Yellow Final  . APPearance 10/25/2012 CLEAR  Clear Final  . Specific Gravity, Urine 10/25/2012 1.025  1.000-1.030 Final  . pH 10/25/2012 6.0  5.0 - 8.0 Final  . Total Protein, Urine 10/25/2012 NEGATIVE  Negative Final  . Urine Glucose 10/25/2012 NEGATIVE  Negative Final  . Ketones, ur 10/25/2012 NEGATIVE  Negative Final  . Bilirubin Urine 10/25/2012 NEGATIVE  Negative Final  . Hgb urine dipstick 10/25/2012 NEGATIVE  Negative Final  . Urobilinogen, UA 10/25/2012 0.2  0.0 - 1.0 Final  . Leukocytes, UA 10/25/2012 NEGATIVE  Negative Final  . Nitrite 10/25/2012 NEGATIVE  Negative Final  . WBC, UA 10/25/2012 0-2/hpf  0-2/hpf Final  . Mucus, UA 10/25/2012 Presence of  None Final  . Squamous Epithelial / LPF 10/25/2012 Rare(0-4/hpf)  Rare(0-4/hpf) Final  . Cholesterol 10/25/2012 194  0 - 200 mg/dL Final   ATP III Classification       Desirable:  < 200 mg/dL               Borderline High:  200 - 239 mg/dL          High:  > = 132 mg/dL  . Triglycerides 10/25/2012 47.0  0.0 - 149.0 mg/dL Final   Normal:  <440 mg/dLBorderline High:  150 - 199 mg/dL  . HDL 10/25/2012 80.80  >39.00 mg/dL Final  . VLDL 11/26/2534 9.4  0.0 - 64.4 mg/dL Final  . LDL Cholesterol 10/25/2012 104* 0 - 99 mg/dL Final  . Total CHOL/HDL Ratio 10/25/2012 2   Final                  Men          Women1/2 Average Risk     3.4          3.3Average Risk          5.0          4.42X Average Risk          9.6          7.13X Average Risk          15.0          11.0

## 2012-11-12 IMAGING — CR DG CHEST 1V PORT
1 series · 1 of 1 positions shown · non-contrast
Comparison: Chest x-ray of 02/16/2011

CLINICAL DATA: Port-A-Cath placement

PORTABLE CHEST - 1 VIEW

[view not recorded]
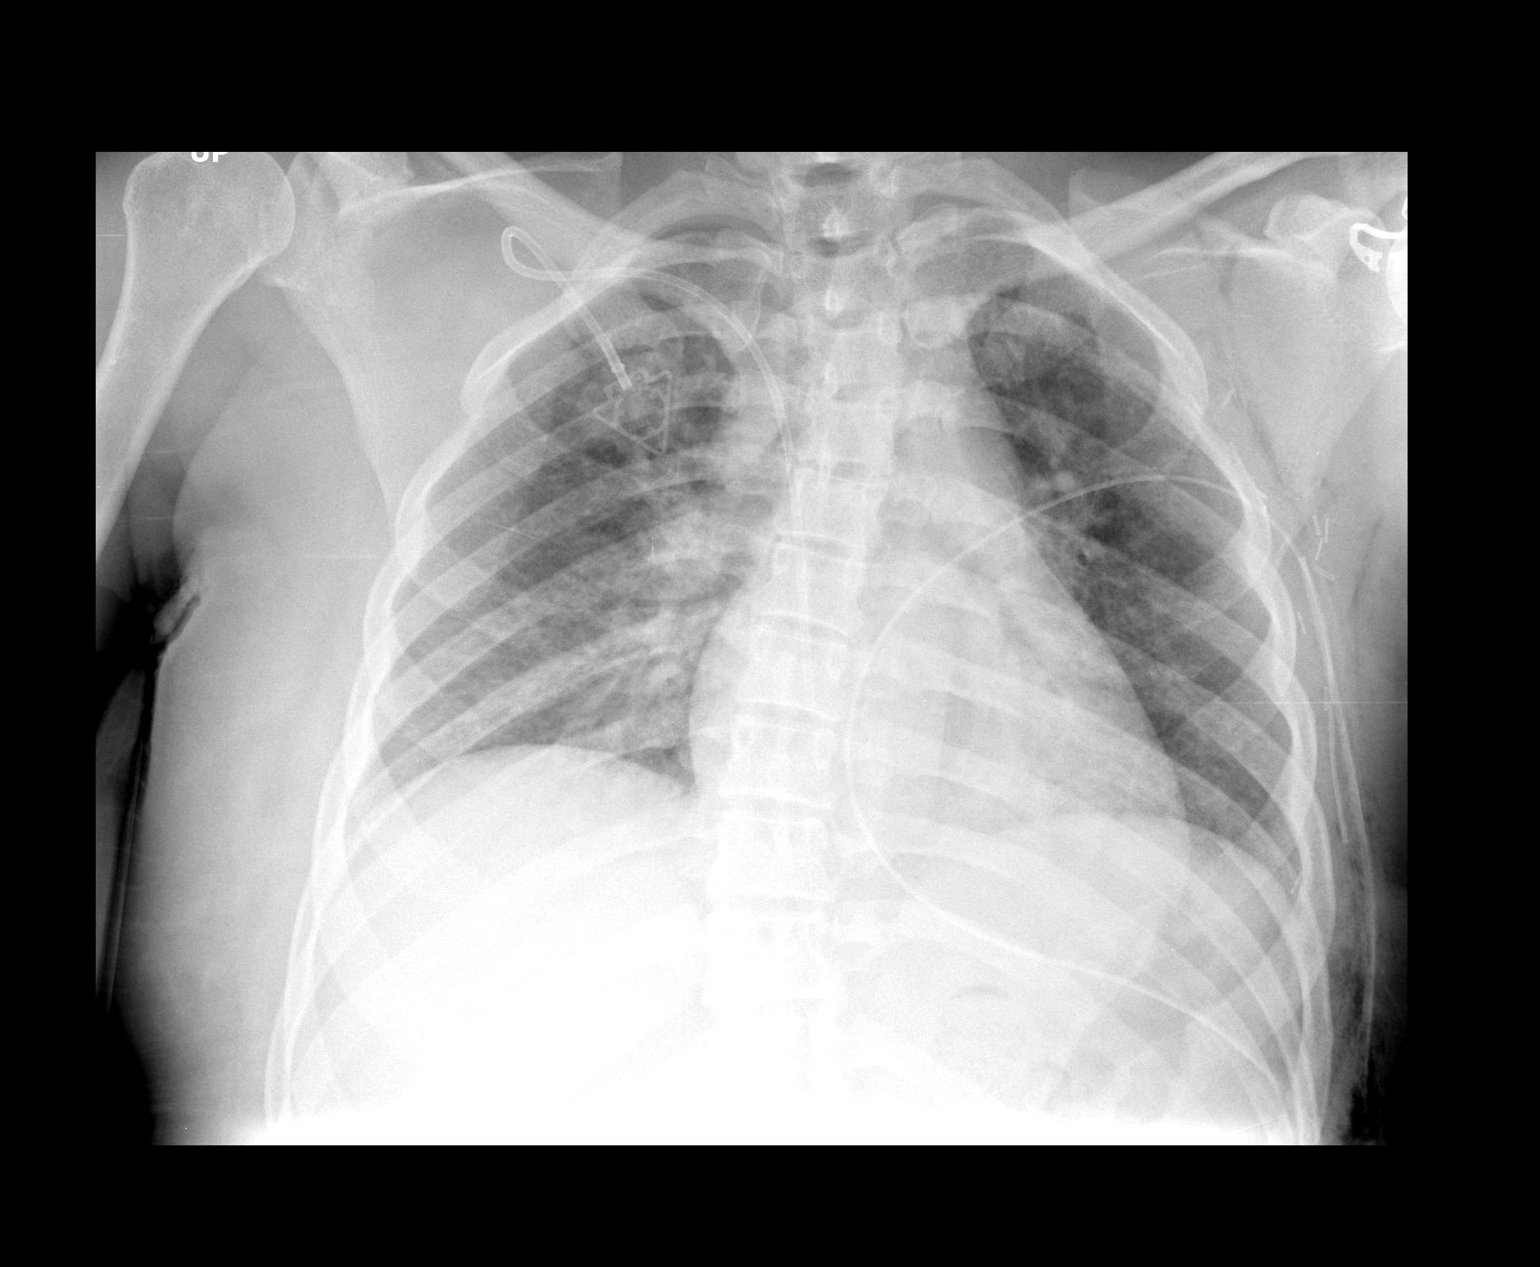

[1 of 1 positions shown; findings below may reference images not displayed]

FINDINGS: The lungs are not well aerated.  A right-sided power
port Port-A-Cath is present with the tip in the region of the right
atrium. Some air is noted in the soft tissues of the left axilla.
Heart size is stable.  Thoracic scoliosis is stable.
IMPRESSION: Right-sided Port-A-Cath tip in right atrium.  No pneumothorax.
Diminished aeration.

## 2012-11-15 ENCOUNTER — Emergency Department (HOSPITAL_COMMUNITY): Payer: Commercial Managed Care - PPO

## 2012-11-15 ENCOUNTER — Emergency Department (HOSPITAL_COMMUNITY)
Admission: EM | Admit: 2012-11-15 | Discharge: 2012-11-16 | Disposition: A | Discharge status: Home / Self Care | Payer: Commercial Managed Care - PPO | Attending: Emergency Medicine | Admitting: Emergency Medicine

## 2012-11-15 ENCOUNTER — Other Ambulatory Visit: Payer: Self-pay

## 2012-11-15 DIAGNOSIS — Z794 Long term (current) use of insulin: Secondary | ICD-10-CM | POA: Insufficient documentation

## 2012-11-15 DIAGNOSIS — Z9221 Personal history of antineoplastic chemotherapy: Secondary | ICD-10-CM | POA: Insufficient documentation

## 2012-11-15 DIAGNOSIS — Z8742 Personal history of other diseases of the female genital tract: Secondary | ICD-10-CM | POA: Insufficient documentation

## 2012-11-15 DIAGNOSIS — Z86718 Personal history of other venous thrombosis and embolism: Secondary | ICD-10-CM | POA: Insufficient documentation

## 2012-11-15 DIAGNOSIS — R5381 Other malaise: Secondary | ICD-10-CM | POA: Insufficient documentation

## 2012-11-15 DIAGNOSIS — E119 Type 2 diabetes mellitus without complications: Secondary | ICD-10-CM | POA: Insufficient documentation

## 2012-11-15 DIAGNOSIS — Z853 Personal history of malignant neoplasm of breast: Secondary | ICD-10-CM | POA: Insufficient documentation

## 2012-11-15 DIAGNOSIS — Z8669 Personal history of other diseases of the nervous system and sense organs: Secondary | ICD-10-CM | POA: Insufficient documentation

## 2012-11-15 DIAGNOSIS — R739 Hyperglycemia, unspecified: Secondary | ICD-10-CM

## 2012-11-15 DIAGNOSIS — Z8639 Personal history of other endocrine, nutritional and metabolic disease: Secondary | ICD-10-CM | POA: Insufficient documentation

## 2012-11-15 DIAGNOSIS — R0789 Other chest pain: Secondary | ICD-10-CM | POA: Insufficient documentation

## 2012-11-15 DIAGNOSIS — Z79899 Other long term (current) drug therapy: Secondary | ICD-10-CM | POA: Insufficient documentation

## 2012-11-15 DIAGNOSIS — R7301 Impaired fasting glucose: Secondary | ICD-10-CM | POA: Diagnosis not present

## 2012-11-15 DIAGNOSIS — Z862 Personal history of diseases of the blood and blood-forming organs and certain disorders involving the immune mechanism: Secondary | ICD-10-CM | POA: Insufficient documentation

## 2012-11-15 DIAGNOSIS — R7309 Other abnormal glucose: Secondary | ICD-10-CM | POA: Insufficient documentation

## 2012-11-15 DIAGNOSIS — I10 Essential (primary) hypertension: Secondary | ICD-10-CM | POA: Insufficient documentation

## 2012-11-15 DIAGNOSIS — R531 Weakness: Secondary | ICD-10-CM

## 2012-11-15 DIAGNOSIS — Z923 Personal history of irradiation: Secondary | ICD-10-CM | POA: Insufficient documentation

## 2012-11-15 LAB — BASIC METABOLIC PANEL
BUN: 12 mg/dL (ref 6–23)
Calcium: 9.9 mg/dL (ref 8.4–10.5)
Creatinine, Ser: 0.59 mg/dL (ref 0.50–1.10)
GFR calc Af Amer: >90 mL/min (ref >90)
GFR calc non Af Amer: >90 mL/min (ref >90)
Glucose, Bld: 147 mg/dL — ABNORMAL HIGH (ref 70–99)
Potassium: 3.7 mEq/L (ref 3.5–5.1)

## 2012-11-15 LAB — CBC WITH DIFFERENTIAL/PLATELET
Basophils Relative: 0 % (ref 0–1)
Eosinophils Absolute: 0.1 10*3/uL (ref 0.0–0.7)
Eosinophils Relative: 2 % (ref 0–5)
HCT: 36.8 % (ref 36.0–46.0)
Hemoglobin: 13.4 g/dL (ref 12.0–15.0)
Lymphs Abs: 2.7 10*3/uL (ref 0.7–4.0)
MCH: 29.6 pg (ref 26.0–34.0)
MCHC: 36.4 g/dL — ABNORMAL HIGH (ref 30.0–36.0)
MCV: 81.4 fL (ref 78.0–100.0)
Monocytes Absolute: 0.2 10*3/uL (ref 0.1–1.0)
Monocytes Relative: 4 % (ref 3–12)
Neutrophils Relative %: 46 % (ref 43–77)
RBC: 4.52 MIL/uL (ref 3.87–5.11)

## 2012-11-15 LAB — GLUCOSE, CAPILLARY

## 2012-11-15 MED ORDER — SODIUM CHLORIDE 0.9 % IV BOLUS (SEPSIS)
1000.0000 mL | Freq: Once | INTRAVENOUS | Status: AC
  Administered 2012-11-15: 1000 mL via INTRAVENOUS

## 2012-11-15 MED ORDER — ASPIRIN 81 MG PO CHEW
324.0000 mg | CHEWABLE_TABLET | Freq: Once | ORAL | Status: AC
  Administered 2012-11-15: 324 mg via ORAL
  Filled 2012-11-15: qty 4

## 2012-11-15 NOTE — ED Provider Notes (Signed)
CSN: [id number]     Arrival date & time 11/15/12  2151 History   First MD Initiated Contact with Patient 11/15/12 2200     Chief Complaint  Patient presents with  . Hyperglycemia  . Weakness   (Consider location/radiation/quality/duration/timing/severity/associated sxs/prior Treatment) HPI Comments: Patient here with complaints of hyperglycemia and weakness.  She states that her sugars have been running high than normal in the upper 300's over the past 3 days.  She states that she has a remote history of DKA and this feels sorta like she felt with that.  She states that while at church tonight a nurse took her blood pressure as well and it was noted to be 171/80.  She states that she has not taken her lisinopril in the past 2 months (her PCP Dr. Waynetta Sandy is aware) because her blood pressure has been running normal.  She notes that her last HgbA1c was 9.3 which she reports is the best it has ever been.  She reports some left anterior chest pain as well over the past 3 days.  She denies radiation of the pain, reports no shortness of breath, cough, congestion, nausea or vomiting.  She does endorse a recent cold which she thinks has made her sugars increase.  She states she is better at checking her blood sugars after meals.  She reports generalized weakness without focal deficits.  She also denies nausea, vomiting or abdominal pain.  Patient is a 42 y.o. female presenting with hyperglycemia and weakness. The history is provided by the patient and the spouse. No language interpreter was used.  Hyperglycemia Blood sugar level PTA:  300 Severity:  Moderate Onset quality:  Gradual Duration:  3 days Timing:  Constant Progression:  Worsening Chronicity:  Recurrent Diabetes status:  Controlled with insulin Current diabetic therapy:  Insulin pump Context: insulin pump use and recent illness   Context: not recent change in diet   Relieved by:  Nothing Ineffective treatments:  None tried Associated  symptoms: chest pain, fatigue and weakness   Associated symptoms: no abdominal pain, no blurred vision, no dehydration, no diaphoresis, no dizziness, no fever, no increased thirst, no malaise, no nausea, no shortness of breath, no syncope, no vomiting and no weight change   Risk factors: hx of DKA and obesity   Weakness Associated symptoms include chest pain, fatigue and weakness. Pertinent negatives include no abdominal pain, diaphoresis, fever, nausea or vomiting.    Past Medical History  Diagnosis Date  . Breast mass in female     fibrocystic changes  . Hyperlipidemia   . Nausea   . Hypertension   . Nausea & vomiting     for approx. 2 weeks  . Cataracts, bilateral   . Diabetes mellitus     insulin dependant - uses pump, Dr. Lucianne [name]  . Breast cancer     T2N2 tiple negative left breast ca   . S/P chemotherapy, time since 4-12 weeks   . History of chemotherapy     taxotere/cytoxan with neulasta support  . Lymphedema of arm     left upper extremity  . Blood clot in vein     nonocclusive clot right subclavian vein, on Xarelto for 6 months  . Allergy   . Breast cancer   . Radiation 08/21/11-10/05/11    Left chestwll/Supraclav./left PAB/left scar   Past Surgical History  Procedure Laterality Date  . Bladder tacking  01/2009  . Shoulder surgery  04/26/10    left  . Tubal ligation  12/1999  .  Left mastectomy  02/21/2011  . Portacath placement  02/21/2011    Procedure: INSERTION PORT-A-CATH;  Surgeon: Ernestene Mention, MD;  Location: [facility] OR;  Service: General;  Laterality: N/A;  . Mastectomy modified radical  02/21/11    left,invasive  multifocal grade III ductal ca,high grade dcis,lymph/vascular invasion invasion (7/14) nodes positive mets  . Port-a-cath removal  10/13/2011    Procedure: MINOR REMOVAL PORT-A-CATH;  Surgeon: Ernestene Mention, MD;  Location: [facility];  Service: General;  Laterality: Right;  PORT-A CATH REMOVAL  . Mass excision  02/19/2012    Procedure:  MINOR EXCISION OF MASS;  Surgeon: Ernestene Mention, MD;  Location: [facility];  Service: General;  Laterality: Left;  . Abdominal hysterectomy  2002    still has ovaries    Family History  Problem Relation Age of Onset  . Hypertension    . Diabetes    . Deep vein thrombosis    . Kidney disease    . Cancer Maternal Aunt     breast  . Cancer Maternal Uncle 50     right eye removed s/p cancer  . Cancer Maternal Grandfather     prostate ca   History  Substance Use Topics  . Smoking status: Never Smoker   . Smokeless tobacco: Never Used  . Alcohol Use: No   OB History   Grav Para Term Preterm Abortions TAB SAB Ect Mult Living                 Obstetric Comments   G4P3, menarche age 21, and menopause at time of hysterectomy; no hx of hormone replacement therapy     Review of Systems  Constitutional: Positive for fatigue. Negative for fever and diaphoresis.  Eyes: Negative for blurred vision.  Respiratory: Negative for shortness of breath.   Cardiovascular: Positive for chest pain. Negative for syncope.  Gastrointestinal: Negative for nausea, vomiting and abdominal pain.  Endocrine: Negative for polydipsia.  Neurological: Positive for weakness. Negative for dizziness.  All other systems reviewed and are negative.    Allergies  Hydrocodone-acetaminophen; Insulin glargine; and Nph iletin i  Home Medications   Current Outpatient Rx  Name  Route  Sig  Dispense  Refill  . acetaminophen (TYLENOL) 500 MG tablet   Oral   Take 1 tablet (500 mg total) by mouth every 6 (six) hours as needed for pain.   30 tablet   0   . cetirizine (ZYRTEC) 10 MG tablet   Oral   Take 1 tablet (10 mg total) by mouth daily.   30 tablet   11   . Cinnamon 500 MG TABS   Oral   Take 1,000 mg by mouth 2 (two) times daily.         Marland Kitchen gabapentin (NEURONTIN) 600 MG tablet   Oral   Take 600 mg by mouth 3 (three) times daily.         . Insulin Human (INSULIN PUMP) 100 unit/ml  SOLN   Subcutaneous   Inject 1 each into the skin continuous. Novolog insulin         . methocarbamol (ROBAXIN) 500 MG tablet   Oral   Take 500 mg by mouth 3 (three) times daily as needed (muscle spasms).          . naproxen sodium (ANAPROX) 220 MG tablet   Oral   Take 1 tablet (220 mg total) by mouth 2 (two) times daily with a meal.   60 tablet  3   . ranitidine (ZANTAC) 150 MG tablet   Oral   Take 1 tablet (150 mg total) by mouth 2 (two) times daily.   60 tablet   2   . venlafaxine (EFFEXOR) 37.5 MG tablet   Oral   Take 37.5 mg by mouth 2 (two) times daily.         Marland Kitchen VITAMIN D, CHOLECALCIFEROL, PO   Oral   Take 1 tablet by mouth daily.          Marland Kitchen lisinopril (PRINIVIL,ZESTRIL) 10 MG tablet   Oral   Take 10 mg by mouth every morning. For blood pressure          BP 140/72  Pulse 80  Temp(Src) 97.7 F (36.5 C) (Oral)  Resp 20  SpO2 97% Physical Exam  Nursing note and vitals reviewed. Constitutional: She is oriented to person, place, and time. She appears well-developed and well-nourished. No distress.  HENT:  Head: Normocephalic and atraumatic.  Right Ear: External ear normal.  Left Ear: External ear normal.  Nose: Nose normal.  Mouth/Throat: Oropharynx is clear and moist. No oropharyngeal exudate.  Eyes: Conjunctivae are normal. Pupils are equal, round, and reactive to light. No scleral icterus.  Neck: Normal range of motion. Neck supple.  Cardiovascular: Normal rate, regular rhythm and normal heart sounds.  Exam reveals no gallop and no friction rub.   No murmur heard. Pulmonary/Chest: Effort normal and breath sounds normal. No respiratory distress. She has no wheezes. She has no rales. She exhibits no tenderness.  Abdominal: Soft. Bowel sounds are normal. She exhibits no distension. There is no tenderness.  Musculoskeletal: Normal range of motion. She exhibits no edema and no tenderness.  Lymphadenopathy:    She has no cervical adenopathy.   Neurological: She is alert and oriented to person, place, and time. She exhibits normal muscle tone. Coordination normal.  Skin: Skin is warm and dry. No rash noted. No erythema. No pallor.  Psychiatric: She has a normal mood and affect. Her behavior is normal. Judgment and thought content normal.    ED Course  Procedures (including critical care time) Labs Review Labs Reviewed  GLUCOSE, CAPILLARY - Abnormal; Notable for the following:    Glucose-Capillary 139 (*)    All other components within normal limits  CBC WITH DIFFERENTIAL - Abnormal; Notable for the following:    MCHC 36.4 (*)    Lymphocytes Relative 48 (*)    All other components within normal limits  BASIC METABOLIC PANEL - Abnormal; Notable for the following:    Glucose, Bld 147 (*)    All other components within normal limits  POCT I-STAT TROPONIN I   Imaging Review Dg Chest 2 View  11/15/2012   CLINICAL DATA:  Hyperglycemia, weakness  EXAM: CHEST  2 VIEW  COMPARISON:  Chest radiograph 08/30/2012  FINDINGS: Normal mediastinum and cardiac silhouette. Normal pulmonary vasculature. No evidence of effusion, infiltrate, or pneumothorax. No acute bony abnormality. Sigmoid scoliosis again noted. Left lymphadenectomy clips.  IMPRESSION: No acute cardiopulmonary process.   Electronically Signed   By: Genevive Bi M.D.   On: 11/15/2012 23:41    EKG Interpretation   None      Date: 11/16/2012  Rate: 81  Rhythm: sinus  QRS Axis:normal  Intervals: normal  ST/T Wave abnormalities: normal  Conduction Disutrbances:none  Narrative Interpretation: normal EKG  Old EKG Reviewed: no significant changes - reviewed by Dr. Lynelle Doctor   Results for orders placed during the hospital encounter of 11/15/12  GLUCOSE,  CAPILLARY      Result Value Range   Glucose-Capillary 139 (*) 70 - 99 mg/dL   Comment 1 Documented in Chart     Comment 2 Notify RN    CBC WITH DIFFERENTIAL      Result Value Range   WBC 5.6  4.0 - 10.5 K/uL   RBC  4.52  3.87 - 5.11 MIL/uL   Hemoglobin 13.4  12.0 - 15.0 g/dL   HCT 69.6  29.5 - 28.4 %   MCV 81.4  78.0 - 100.0 fL   MCH 29.6  26.0 - 34.0 pg   MCHC 36.4 (*) 30.0 - 36.0 g/dL   RDW 13.2  44.0 - 10.2 %   Platelets 252  150 - 400 K/uL   Neutrophils Relative % 46  43 - 77 %   Neutro Abs 2.6  1.7 - 7.7 K/uL   Lymphocytes Relative 48 (*) 12 - 46 %   Lymphs Abs 2.7  0.7 - 4.0 K/uL   Monocytes Relative 4  3 - 12 %   Monocytes Absolute 0.2  0.1 - 1.0 K/uL   Eosinophils Relative 2  0 - 5 %   Eosinophils Absolute 0.1  0.0 - 0.7 K/uL   Basophils Relative 0  0 - 1 %   Basophils Absolute 0.0  0.0 - 0.1 K/uL  BASIC METABOLIC PANEL      Result Value Range   Sodium 137  135 - 145 mEq/L   Potassium 3.7  3.5 - 5.1 mEq/L   Chloride 102  96 - 112 mEq/L   CO2 26  19 - 32 mEq/L   Glucose, Bld 147 (*) 70 - 99 mg/dL   BUN 12  6 - 23 mg/dL   Creatinine, Ser 7.25  0.50 - 1.10 mg/dL   Calcium 9.9  8.4 - 36.6 mg/dL   GFR calc non Af Amer >90  >90 mL/min   GFR calc Af Amer >90  >90 mL/min  POCT I-STAT TROPONIN I      Result Value Range   Troponin i, poc 0.00  0.00 - 0.08 ng/mL   Comment 3            Dg Chest 2 View  11/15/2012   CLINICAL DATA:  Hyperglycemia, weakness  EXAM: CHEST  2 VIEW  COMPARISON:  Chest radiograph 08/30/2012  FINDINGS: Normal mediastinum and cardiac silhouette. Normal pulmonary vasculature. No evidence of effusion, infiltrate, or pneumothorax. No acute bony abnormality. Sigmoid scoliosis again noted. Left lymphadenectomy clips.  IMPRESSION: No acute cardiopulmonary process.   Electronically Signed   By: Genevive Bi M.D.   On: 11/15/2012 23:41     MDM  Hyperglycemia Atypical Chest pain  Patient here with 3 day history of atypical chest pain and hyperglycemia with generalized weakness.  She was concerned she was in DKA, which there is no evidence of.  Her blood sugars here are 150, she has no change in her EKG and her troponin here was normal so I doubt cardiac cause to her  chest pain.  She reports a history of viral URI which is likely the cause to her symtpoms.  She will follow up with her PCP, Dr. Waynetta Sandy this coming week.  She was given strict return precautions.   Izola Price Marisue Humble, PA-C 11/16/12 0030

## 2012-11-15 NOTE — ED Notes (Signed)
Pt arrives by EMS with c/o's blood sugar >300 over the past 3 days associated with generalized weakness.  Denies N/V-currently CBG 126 per EMS-pt has an insulin pump and is currently functioning with no problem.  EMS picked up pt at church this evening.

## 2012-11-16 NOTE — ED Provider Notes (Signed)
Medical screening examination/treatment/procedure(s) were performed by non-physician practitioner and as supervising physician I was immediately available for consultation/collaboration.     Aaleeyah Bias R Jodeci Rini, MD 11/16/12 1605 

## 2012-11-22 ENCOUNTER — Ambulatory Visit: Payer: Commercial Managed Care - PPO | Admitting: Endocrinology

## 2012-12-03 ENCOUNTER — Ambulatory Visit (INDEPENDENT_AMBULATORY_CARE_PROVIDER_SITE_OTHER): Payer: Commercial Managed Care - PPO | Admitting: Endocrinology

## 2012-12-03 ENCOUNTER — Encounter: Payer: Self-pay | Admitting: Endocrinology

## 2012-12-03 ENCOUNTER — Other Ambulatory Visit: Payer: Self-pay | Admitting: *Deleted

## 2012-12-03 VITALS — BP 118/70 | HR 78 | Temp 97.9°F | Resp 12 | Ht 65.0 in | Wt 203.1 lb

## 2012-12-03 DIAGNOSIS — E1065 Type 1 diabetes mellitus with hyperglycemia: Secondary | ICD-10-CM

## 2012-12-03 DIAGNOSIS — I10 Essential (primary) hypertension: Secondary | ICD-10-CM

## 2012-12-03 DIAGNOSIS — E11319 Type 2 diabetes mellitus with unspecified diabetic retinopathy without macular edema: Secondary | ICD-10-CM

## 2012-12-03 DIAGNOSIS — E1139 Type 2 diabetes mellitus with other diabetic ophthalmic complication: Secondary | ICD-10-CM

## 2012-12-03 MED ORDER — INSULIN ASPART 100 UNIT/ML ~~LOC~~ SOLN: SUBCUTANEOUS | Status: DC

## 2012-12-03 NOTE — Progress Notes (Signed)
Patient ID: [name], female   DOB: [birth year], 42 y.o.   MRN: [id number]  [name] is an 42 y.o. female.   Reason for Appointment: Insulin Pump followup:   History of Present Illness   Diagnosis: Type 1 DIABETES MELITUS, date of diagnosis: 2002    Previous history: She generally has had poor control and A1c in the past has been consistently over 8% despite using insulin pump since 2005. This is partly related to her lack of motivation to check blood sugars consistently and also because of variability in her blood sugars and periods of hyperglycemia and hypoglycemia. Also not consistently bolusing for meals and high sugars  Generally had been irregular with her followup  CURRENT insulin pump: One Touch Ping   PUMP SETTINGS are: Midnight = 1.5  4 a.m = 2.0, 6 AM = 2.6,. 9 AM = 2.4,. 1 p.m. = 2.2 , 6 PM = 2.8 and 8 PM = 1.6.  Carbohydrate coverage 1: 15 and sensitivity 1: 30 with target 120 Pump management: She is getting a total of about 50 units from basal amount of boluses range from 0- 15 %, mostly very low amounts  RECENT history: Again her blood sugars are fluctuating significantly especially in the mornings. However she is not checking her blood sugars very much later in the day Hyperglycemia: she has periodic high readings in the mornings which are unexplained  HYPOGLYCEMIC episodes: Sporadically between 7 AM-4 PM, usually has symptoms of shakiness but not always symptomatic, may be able to tell her blood sugar is below 60 GLUCOSE readings: Fasting range 47-350 with average before 10 AM = 116. PC breakfast 55, 59, between 12 noon-3 PM 59-67 5-8 PM 148, 216 and after 9 PM 71, 79. Overall average 116  Problems identified:  1. Not checking blood sugars enough and rarely after meals 2. She now says that she will not bolusing for all her meals and snacks because of running low on her insulin supply. 3. She appears to be occasionally hypoglycemic after her morning bolus  which includes a correction factor  4. Tends to have relatively low blood sugars between 12 noon-4 PM 5. Blood sugars are trending slightly after 6 PM but has a few good readings late at night 6. Most of her boluses are only in the morning with sporadic boluses later in the day, she thinks this is because she is not eating a full meal  Overall poor control as judged by A1c of 9.3 in 9/14  LABS:  Lab Results  Component Value Date   HGBA1C 9.3* 10/25/2012   HGBA1C 9.5 07/10/2012   HGBA1C 8.2 12/03/2009   Lab Results  Component Value Date   MICROALBUR 1.9 10/25/2012   LDLCALC 104* 10/25/2012   CREATININE 0.59 11/15/2012    EXERCISE: Walking on treadmill 3/7 or outside      Medication List       This list is accurate as of: 12/03/12 11:18 AM.  Always use your most recent med list.               acetaminophen 500 MG tablet  Commonly known as:  TYLENOL  Take 1 tablet (500 mg total) by mouth every 6 (six) hours as needed for pain.     cetirizine 10 MG tablet  Commonly known as:  ZYRTEC  Take 1 tablet (10 mg total) by mouth daily.     Cinnamon 500 MG Tabs  Take 1,000 mg by mouth 2 (two) times daily.  fish oil-omega-3 fatty acids 1000 MG capsule  Take 2 g by mouth daily.     gabapentin 600 MG tablet  Commonly known as:  NEURONTIN  Take 600 mg by mouth 3 (three) times daily.     insulin pump 100 unit/ml Soln  Inject 1 each into the skin continuous. Novolog insulin     lisinopril 10 MG tablet  Commonly known as:  PRINIVIL,ZESTRIL  Take 10 mg by mouth every morning. For blood pressure     methocarbamol 500 MG tablet  Commonly known as:  ROBAXIN  Take 500 mg by mouth 3 (three) times daily as needed (muscle spasms).     naproxen sodium 220 MG tablet  Commonly known as:  ANAPROX  Take 1 tablet (220 mg total) by mouth 2 (two) times daily with a meal.     ranitidine 150 MG tablet  Commonly known as:  ZANTAC  Take 1 tablet (150 mg total) by mouth 2 (two) times daily.      venlafaxine 37.5 MG tablet  Commonly known as:  EFFEXOR  Take 37.5 mg by mouth 2 (two) times daily.     VITAMIN D (CHOLECALCIFEROL) PO  Take 1 tablet by mouth daily.        Allergies:  Allergies  Allergen Reactions  . Hydrocodone-Acetaminophen Itching and Swelling  . Insulin Glargine Itching, Nausea And Vomiting and Swelling  . Nph Iletin I [Insulin Isophane, Mixed Nph] Itching, Nausea And Vomiting and Swelling    Past Medical History  Diagnosis Date  . Breast mass in female     fibrocystic changes  . Hyperlipidemia   . Nausea   . Hypertension   . Nausea & vomiting     for approx. 2 weeks  . Cataracts, bilateral   . Diabetes mellitus     insulin dependant - uses pump, Dr. Lucianne [name]  . Breast cancer     T2N2 tiple negative left breast ca   . S/P chemotherapy, time since 4-12 weeks   . History of chemotherapy     taxotere/cytoxan with neulasta support  . Lymphedema of arm     left upper extremity  . Blood clot in vein     nonocclusive clot right subclavian vein, on Xarelto for 6 months  . Allergy   . Breast cancer   . Radiation 08/21/11-10/05/11    Left chestwll/Supraclav./left PAB/left scar    Past Surgical History  Procedure Laterality Date  . Bladder tacking  01/2009  . Shoulder surgery  04/26/10    left  . Tubal ligation  12/1999  . Left mastectomy  02/21/2011  . Portacath placement  02/21/2011    Procedure: INSERTION PORT-A-CATH;  Surgeon: Ernestene Mention, MD;  Location: [facility] OR;  Service: General;  Laterality: N/A;  . Mastectomy modified radical  02/21/11    left,invasive  multifocal grade III ductal ca,high grade dcis,lymph/vascular invasion invasion (7/14) nodes positive mets  . Port-a-cath removal  10/13/2011    Procedure: MINOR REMOVAL PORT-A-CATH;  Surgeon: Ernestene Mention, MD;  Location: [facility];  Service: General;  Laterality: Right;  PORT-A CATH REMOVAL  . Mass excision  02/19/2012    Procedure: MINOR EXCISION OF MASS;  Surgeon:  Ernestene Mention, MD;  Location: [facility];  Service: General;  Laterality: Left;  . Abdominal hysterectomy  2002    still has ovaries     Family History  Problem Relation Age of Onset  . Hypertension    . Diabetes    .  Deep vein thrombosis    . Kidney disease    . Cancer Maternal Aunt     breast  . Cancer Maternal Uncle 50     right eye removed s/p cancer  . Cancer Maternal Grandfather     prostate ca    Social History:  reports that she has never smoked. She has never used smokeless tobacco. She reports that she does not drink alcohol or use illicit drugs.  ROS:   She has history of hyperlipidemia, currently not on medication History of mild hypertension well controlled with lisinopril History of depression treated with Effexor History of vitamin D deficiency  EXAM:  BP 118/70  Pulse 78  Temp(Src) 97.9 F (36.6 C)  Resp 12  Ht 5\' 5"  (1.651 m)  Wt 203 lb 1.6 oz (92.126 kg)  BMI 33.80 kg/m2  SpO2 99%  No ankle edema  ASSESSMENT:  DIABETES type 1:   She generally has had poor control and this is related to both noncompliance with self-care and also tendency to erratic blood sugar levels because of her inherent type of diabetes See history of present illness for detailed assessment of her current management and problems identified Currently the limiting factor is related to her control or lack of frequent glucose monitoring especially later in the day and also reducing her not taking her boluses at all for meals and snacks. She now says that she may be getting low on her insulin supply when she is due for a refill although she appears to be using about 2 insulin vials a month  Again have discussed with her the day today management of her diabetes involving glucose monitoring with every meal and some readings after meals to help assess mealtime coverage She will also need to get full coverage of all her carbohydrate intake at meals and snacks Will  change her prescription to provide adequate supply of insulin for all the boluses Blood sugars will be better controlled she does a correction for all the high readings when she checks them 3-4 times a day Encouraged her to check her blood sugar before an exercise to detect any tendency to hypoglycemia Will reduce her basal rate from 12 noon to 2.1 and continue this until only 5 PM other than 6 PM   Counseling time over 50% of today's 25 minute visit  [name] 12/03/2012, 11:18 AM

## 2012-12-03 NOTE — Patient Instructions (Addendum)
Bolus for all meals and carb snacks  Check sugars 4x per day

## 2012-12-04 DIAGNOSIS — I10 Essential (primary) hypertension: Secondary | ICD-10-CM | POA: Insufficient documentation

## 2012-12-05 ENCOUNTER — Other Ambulatory Visit: Payer: Self-pay

## 2012-12-06 ENCOUNTER — Other Ambulatory Visit: Payer: Self-pay | Admitting: *Deleted

## 2012-12-06 DIAGNOSIS — C50919 Malignant neoplasm of unspecified site of unspecified female breast: Secondary | ICD-10-CM

## 2012-12-06 MED ORDER — VENLAFAXINE HCL 37.5 MG PO TABS: 37.5000 mg | ORAL_TABLET | Freq: Two times a day (BID) | ORAL | Status: DC

## 2012-12-12 ENCOUNTER — Other Ambulatory Visit: Payer: Self-pay | Admitting: Family

## 2012-12-12 DIAGNOSIS — N6489 Other specified disorders of breast: Secondary | ICD-10-CM

## 2012-12-12 DIAGNOSIS — Z9012 Acquired absence of left breast and nipple: Secondary | ICD-10-CM

## 2012-12-24 ENCOUNTER — Encounter: Payer: Self-pay | Admitting: Family Medicine

## 2012-12-31 ENCOUNTER — Ambulatory Visit
Admission: RE | Admit: 2012-12-31 | Discharge: 2012-12-31 | Disposition: A | Discharge status: Home / Self Care | Payer: Commercial Managed Care - PPO | Source: Ambulatory Visit | Attending: Family | Admitting: Family

## 2012-12-31 DIAGNOSIS — N6489 Other specified disorders of breast: Secondary | ICD-10-CM

## 2012-12-31 DIAGNOSIS — Z9012 Acquired absence of left breast and nipple: Secondary | ICD-10-CM

## 2013-01-16 ENCOUNTER — Other Ambulatory Visit (HOSPITAL_BASED_OUTPATIENT_CLINIC_OR_DEPARTMENT_OTHER): Payer: Commercial Managed Care - PPO

## 2013-01-16 ENCOUNTER — Ambulatory Visit (HOSPITAL_BASED_OUTPATIENT_CLINIC_OR_DEPARTMENT_OTHER): Payer: Commercial Managed Care - PPO | Admitting: Oncology

## 2013-01-16 ENCOUNTER — Encounter: Payer: Self-pay | Admitting: Oncology

## 2013-01-16 VITALS — BP 142/95 | HR 82 | Temp 98.4°F | Resp 20 | Ht 65.0 in | Wt 199.0 lb

## 2013-01-16 DIAGNOSIS — Z853 Personal history of malignant neoplasm of breast: Secondary | ICD-10-CM

## 2013-01-16 DIAGNOSIS — C50919 Malignant neoplasm of unspecified site of unspecified female breast: Secondary | ICD-10-CM

## 2013-01-16 DIAGNOSIS — C50912 Malignant neoplasm of unspecified site of left female breast: Secondary | ICD-10-CM

## 2013-01-16 DIAGNOSIS — Z9010 Acquired absence of unspecified breast and nipple: Secondary | ICD-10-CM

## 2013-01-16 DIAGNOSIS — Z171 Estrogen receptor negative status [ER-]: Secondary | ICD-10-CM

## 2013-01-16 LAB — COMPREHENSIVE METABOLIC PANEL (CC13)
AST: 24 U/L (ref 5–34)
Alkaline Phosphatase: 178 U/L — ABNORMAL HIGH (ref 40–150)
Anion Gap: 9 mEq/L (ref 3–11)
BUN: 9.1 mg/dL (ref 7.0–26.0)
CO2: 27 mEq/L (ref 22–29)
Glucose: 172 mg/dl — ABNORMAL HIGH (ref 70–140)
Sodium: 141 mEq/L (ref 136–145)
Total Bilirubin: 0.87 mg/dL (ref 0.20–1.20)
Total Protein: 7.4 g/dL (ref 6.4–8.3)

## 2013-01-16 LAB — CBC WITH DIFFERENTIAL/PLATELET
BASO%: 1.1 % (ref 0.0–2.0)
EOS%: 2.8 % (ref 0.0–7.0)
Eosinophils Absolute: 0.1 10*3/uL (ref 0.0–0.5)
HCT: 39.7 % (ref 34.8–46.6)
LYMPH%: 42.7 % (ref 14.0–49.7)
MCH: 29.1 pg (ref 25.1–34.0)
MCHC: 34.7 g/dL (ref 31.5–36.0)
MCV: 84.1 fL (ref 79.5–101.0)
MONO%: 4.5 % (ref 0.0–14.0)
Platelets: 210 10*3/uL (ref 145–400)
RBC: 4.73 10*6/uL (ref 3.70–5.45)
RDW: 13.3 % (ref 11.2–14.5)

## 2013-01-16 NOTE — Progress Notes (Signed)
OFFICE PROGRESS NOTE  CC  Terri Carnes, MD [address] [place] [address] Dr. Claud [name] Dr. Lurline [name]  DIAGNOSIS: 42 year old female with diagnosis of triple negative invasive ductal carcinoma that was multifocal grade 3 measuring 2.4 cm. 7 of 14 lymph nodes positive diagnosed in January 2013.  PRIOR THERAPY:  #1 patient underwent a screening mammogram in December 2012. She was noted to have a mass and she underwent a diagnostic mammogram and ultrasound on December 18. She had image guided biopsy of one of the masses at the central position that revealed invasive ductal carcinoma. The invasive ductal carcinoma was also noted in the left axillary node. Both tumors were triple negative.  #2 she had MRI of bilateral breasts performed 01/25/2011 that showed 2.4 cm mass in the 12:00 position of the left breast for additional masses were noted left axillary lymph nodes were noted in level I as well mildly prominent left level II retropectoral lymph nodes. There was no evidence of malignancy in the right breast.  #3 patient elected a left mastectomy and axillary lymph node dissection on 02/21/2011. She was found to have multifocal invasive ductal carcinoma, grade 3, there were noted to be 5 foci of disease the largest measuring 2.4 cm. High-grade ductal carcinoma in situ was identified with lymphovascular invasion in the invasive disease. Margins of resection were widely negative with the closest being 1 cm 7 of 14 lymph nodes were positive.  #4 patient went on to have chemotherapy on NSABP B. 49 clinical trial. She was randomized to 6 cycles of Taxotere Cytoxan every 3 weeks with day 2 Neulasta. She completed all of her therapy in July 2013.  #5 she then went on to receive postmastectomy radiation therapy from 08/10/2011 through 10/05/2011.  #6 patient did develop right upper extremity DVT she was placed on several toe 15 mg by mouth twice a day.  #7 patient did undergo  genetic testing on 01/27/2011 which was negative for BRCA1 and BRCA2 gene mutation and no large rearrangements were detected.  CURRENT THERAPY: Observation  INTERVAL HISTORY: [name] 42 y.o. female returns for followup visit today. Overall she seems to be doing well she is continuing to have peripheral neuropathy type of pain. She is also experiencing hot flashes she is on Effexor 37.7 mg twice a day. She is also a known type I diabetic. She does have left upper extremity lymphedema she does wear a lymphedema sleeve. She is otherwise denies any fevers chills night sweats headaches shortness of breath chest pains palpitations she does have some myalgias and minimal arthralgias. She has no nausea or vomiting no Donald pain no diarrhea she has not noticed any masses in her left chest wall. She has not noticed any masses in her right breast. She is a little bit frustrated with not being able to lose weight. She is trying to exercise and eat healthy so that she can eventually undergo reconstruction of the left breast. Remainder of the 10 point review of systems is unremarkable.  MEDICAL HISTORY: Past Medical History  Diagnosis Date  . Breast mass in female     fibrocystic changes  . Hyperlipidemia   . Nausea   . Hypertension   . Nausea & vomiting     for approx. 2 weeks  . Cataracts, bilateral   . Diabetes mellitus     insulin dependant - uses pump, Dr. [name]  . Breast cancer     T2N2 tiple negative left breast ca   .  S/P chemotherapy, time since 4-12 weeks   . History of chemotherapy     taxotere/cytoxan with neulasta support  . Lymphedema of arm     left upper extremity  . Blood clot in vein     nonocclusive clot right subclavian vein, on Xarelto for 6 months  . Allergy   . Breast cancer   . Radiation 08/21/11-10/05/11    Left chestwll/Supraclav./left PAB/left scar    ALLERGIES:  is allergic to hydrocodone-acetaminophen; insulin glargine; and nph iletin i.  MEDICATIONS:   Current Outpatient Prescriptions  Medication Sig Dispense Refill  . acetaminophen (TYLENOL) 500 MG tablet Take 1 tablet (500 mg total) by mouth every 6 (six) hours as needed for pain.  30 tablet  0  . cetirizine (ZYRTEC) 10 MG tablet Take 1 tablet (10 mg total) by mouth daily.  30 tablet  11  . Cinnamon 500 MG TABS Take 1,000 mg by mouth 2 (two) times daily.      . fish oil-omega-3 fatty acids 1000 MG capsule Take 2 g by mouth daily.      Marland Kitchen gabapentin (NEURONTIN) 600 MG tablet Take 600 mg by mouth 3 (three) times daily.      . insulin aspart (NOVOLOG) 100 UNIT/ML injection Use max 80 units per day with pump, this is for a 30 day supply  4 vial  5  . Insulin Human (INSULIN PUMP) 100 unit/ml SOLN Inject 1 each into the skin continuous. Novolog insulin      . lisinopril (PRINIVIL,ZESTRIL) 10 MG tablet Take 10 mg by mouth every morning. For blood pressure      . methocarbamol (ROBAXIN) 500 MG tablet Take 500 mg by mouth 3 (three) times daily as needed (muscle spasms).       . naproxen sodium (ANAPROX) 220 MG tablet Take 1 tablet (220 mg total) by mouth 2 (two) times daily with a meal.  60 tablet  3  . ranitidine (ZANTAC) 150 MG tablet Take 1 tablet (150 mg total) by mouth 2 (two) times daily.  60 tablet  2  . venlafaxine (EFFEXOR) 37.5 MG tablet Take 1 tablet (37.5 mg total) by mouth 2 (two) times daily.  60 tablet  2  . VITAMIN D, CHOLECALCIFEROL, PO Take 1 tablet by mouth daily.        No current facility-administered medications for this visit.    SURGICAL HISTORY:  Past Surgical History  Procedure Laterality Date  . Bladder tacking  01/2009  . Shoulder surgery  04/26/10    left  . Tubal ligation  12/1999  . Left mastectomy  02/21/2011  . Portacath placement  02/21/2011    Procedure: INSERTION PORT-A-CATH;  Surgeon: Ernestene Mention, MD;  Location: [facility] OR;  Service: General;  Laterality: N/A;  . Mastectomy modified radical  02/21/11    left,invasive  multifocal grade III ductal ca,high  grade dcis,lymph/vascular invasion invasion (7/14) nodes positive mets  . Port-a-cath removal  10/13/2011    Procedure: MINOR REMOVAL PORT-A-CATH;  Surgeon: Ernestene Mention, MD;  Location: [facility];  Service: General;  Laterality: Right;  PORT-A CATH REMOVAL  . Mass excision  02/19/2012    Procedure: MINOR EXCISION OF MASS;  Surgeon: Ernestene Mention, MD;  Location: [facility];  Service: General;  Laterality: Left;  . Abdominal hysterectomy  2002    still has ovaries     REVIEW OF SYSTEMS:  Pertinent items are noted in HPI.   HEALTH MAINTENANCE:   PHYSICAL EXAMINATION:  Blood pressure 142/95, pulse 82, temperature 98.4 F (36.9 C), temperature source Oral, resp. rate 20, height 5\' 5"  (1.651 m), weight 199 lb (90.266 kg). Body mass index is 33.12 kg/(m^2). ECOG PERFORMANCE STATUS: 0 - Asymptomatic Well-developed well-nourished female in no acute distress HEENT exam EOMI PERRLA sclerae anicteric no conjunctival pallor oral mucosa is moist neck is supple lungs are clear to auscultation cardiovascular is regular rate rhythm abdomen is soft obese nontender nondistended bowel sounds are present no hepatosplenomegaly extremities trace edema neuro patient's alert oriented otherwise nonfocal right breast no masses nipple discharge left chest wall anteriorly no evidence of local recurrence well-healed surgical scar.  LABORATORY DATA: Lab Results  Component Value Date   WBC 4.1 01/16/2013   HGB 13.8 01/16/2013   HCT 39.7 01/16/2013   MCV 84.1 01/16/2013   PLT 210 01/16/2013      Chemistry      Component Value Date/Time   NA 141 01/16/2013 0927   NA 137 11/15/2012 2259   K 4.2 01/16/2013 0927   K 3.7 11/15/2012 2259   CL 102 11/15/2012 2259   CL 107 07/15/2012 1021   CO2 27 01/16/2013 0927   CO2 26 11/15/2012 2259   BUN 9.1 01/16/2013 0927   BUN 12 11/15/2012 2259   CREATININE 0.8 01/16/2013 0927   CREATININE 0.59 11/15/2012 2259      Component Value  Date/Time   CALCIUM 9.8 01/16/2013 0927   CALCIUM 9.9 11/15/2012 2259   ALKPHOS 178* 01/16/2013 0927   ALKPHOS 157* 10/25/2012 1433   AST 24 01/16/2013 0927   AST 31 10/25/2012 1433   ALT 23 01/16/2013 0927   ALT 39* 10/25/2012 1433   BILITOT 0.87 01/16/2013 0927   BILITOT 0.7 10/25/2012 1433     ADDITIONAL INFORMATION: 1. PROGNOSTIC INDICATORS - ACIS Results IMMUNOHISTOCHEMICAL AND MORPHOMETRIC ANALYSIS BY THE AUTOMATED CELLULAR IMAGING SYSTEM (ACIS) Estrogen Receptor (Negative, <1%): 0%, NEGATIVE Progesterone Receptor (Negative, <1%): 0%, NEGATIVE COMMENT: The negative hormone receptor study(ies) in this case have an internal positive control. All controls stained appropriately Pecola Leisure MD Pathologist, Electronic Signature ( Signed 03/01/2011) 1. CHROMOGENIC IN-SITU HYBRIDIZATION Interpretation HER-2/NEU BY CISH - NO AMPLIFICATION OF HER-2 DETECTED. THE RATIO OF HER-2: CEP 17 SIGNALS WAS 1.41. Reference range: Ratio: HER2:CEP17 < 1.8 - gene amplification not observed Ratio: HER2:CEP 17 1.8-2.2 - equivocal result Ratio: HER2:CEP17 > 2.2 - gene amplification observed Pecola Leisure MD Pathologist, Electronic Signature ( Signed 03/01/2011) 1 of 4 FINAL for FARIA, CASELLA ([id number]) FINAL DIAGNOSIS Diagnosis 1. Breast, modified radical mastectomy , left - INVASIVE MULTIFOCAL GRADE III DUCTAL CARCINOMA, LARGEST FOCUS SPANS 2.4 CM. - FOUR OTHER FOCI PRESENT MEASURING 1.2, 1.0, 1.0 AND 0.2 CM - HIGH GRADE DUCTAL CARCINOMA IN SITU IDENTIFIED. - LYMPH/VASCULAR INVASION IS IDENTIFIED. - MARGINS OF MASTECTOMY SPECIMEN ARE NEGATIVE. - SEVEN OF FOURTEEN LYMPH NODES POSITIVE FOR METASTATIC CARCINOMA. - NODULE OF GRADE III DUCTAL CARCINOMA ASSOCIATED WITH ECTOPIC BREAST TISSUE PRESENT IN AXILLARY CONTENTS, MEASURING 0.5 CM. - SEE ONCOLOGY TEMPLATE. 2. Lymph node, biopsy, left axillary, highest level 2 - ONE BENIGN LYMPH NODE WITH NO TUMOR SEEN (0/1). Microscopic  Comment 1. BREAST, INVASIVE TUMOR, WITH LYMPH NODE SAMPLING Specimen, including laterality: Left modified radical mastectomy. Procedure: Left modified radial mastectomy. Grade: III (all areas of carcinoma are morphologically similar) Tubule formation: 3 Nuclear pleomorphism: 2 Mitotic: 3 Tumor size (gross measurement and/or glass slide measurement): 2.4 cm, largest focus; 1.2, 1.0, 1.0, and 0.2 cm additional foci. Margins: Invasive, distance to closest margin:  1 cm. In-situ, distance to closest margin: At least 1 cm. Lymphovascular invasion: Present. Ductal carcinoma in situ: Present. Grade: High grade. Extensive intraductal component: No. Lobular neoplasia: No. Tumor focality: Multifocal Treatment effect: N/A Extent of tumor: Tumor present in breast parenchyma; A second focus is present in ectopic breast tissue present in axillary contents. Lymph nodes: # examined: 16. Lymph nodes with metastasis: 7. Macrometastasis: (> 2.0 mm): 7. Extracapsular extension: Not identified. Breast prognostic profile: 1 o'clock lesion, ([id number]): Estrogen receptor: 0%, negative. Progesterone receptor: 0%, negative. Her 2 neu: 1.36, no amplification. Ki-67: 99%. 12 o'clock (largest lesion), ([id number]): Estrogen receptor: 0%, negative. Progesterone receptor: 0%, negative. Her 2 neu: 1.25, no amplification. Ki-67: 78%. 2 of 4 FINAL for DEKLYN, TRACHTENBERG ([id number]) Microscopic Comment(continued) Non-neoplastic breast: Usual ductal hyperplasia present. TNM: mpT2, pN2a, MX. Comments: Immunohistochemical staining is performed on three blocks (1Q, 1S, and 1R) for cytokeratin AE1/AE3 for quantification of the metastatic tumor deposits. All of the tumor foci are morphologically identical and as such, ER, PR and Her-2/neu by CISH will be repeated on the largest tumor focus and reported in an addendum. Additional breast prognostic profiles can be performed on the additional  tumor nodules upon request. (RAH:gt, 02/23/11) ROBERT HILLARD  RADIOGRAPHIC STUDIES:  Dg Orthopantogram  06/27/2012   *RADIOLOGY REPORT*  Clinical Data: Jaw pain.  Breast cancer.  ORTHOPANTOGRAM/PANORAMIC  Comparison: 02/13/2011 PET CT  Findings: 0.9 x 0.5 cm lucent lesion along the right mandibular condyle is not readily apparent on the prior PET CT.  Minimal otherwise unremarkable.  IMPRESSION:  1. Sub centimeter lucency in the right mandibular condyle.  This could well represent an erosion or benign lesion.  Lytic osseous metastatic disease cannot be completely excluded - consider whole- body bone scan.   Original Report Authenticated By: Gaylyn Rong, M.D.   Nm Bone Scan Whole Body  07/04/2012   *RADIOLOGY REPORT*  Clinical Data: Breast cancer.  Jaw pain.  NUCLEAR MEDICINE WHOLE BODY BONE SCINTIGRAPHY  Technique:  Whole body anterior and posterior images were obtained approximately 3 hours after intravenous injection of radiopharmaceutical.  Radiopharmaceutical: CURIE TC-MDP TECHNETIUM TC 48M MEDRONATE IV KIT  Comparison: Orthopantogram dated 06/27/2012.  PET CT scan dated 02/13/2011  Findings: There is no evidence of metastatic disease to the skeleton.  Specifically, the mandible appears normal including the region of the temporomandibular joints bilaterally.  The patient does have a slight thoracolumbar scoliosis.  IMPRESSION: Essentially normal bone scan.  No evidence of metastatic disease. Specifically, the mandible appears normal.   Original Report Authenticated By: Francene Boyers, M.D.   US Breast Right  07/11/2012   *RADIOLOGY REPORT*  Clinical Data:  History of left mastectomy.  Short-term reevaluation right breast for probably benign asymmetry.  DIGITAL DIAGNOSTIC RIGHT MAMMOGRAM WITH CAD AND RIGHT BREAST ULTRASOUND:  Comparison:  01/10/2012, 12/29/2011, 01/26/2011, 01/18/2011, 12/27/2010, 12/13/2009  Findings:  ACR Breast Density Category 2: There is a scattered fibroglandular  pattern.  The previously perceived asymmetry in the medial right breast has become less apparent.  It again appears most consistent with overlapping normal glandular tissue.  Mammographic images were processed with CAD.  On physical exam, there are no palpable abnormalities.  Ultrasound is performed, showing no abnormalities in the medial right breast.  IMPRESSION: Overlapping fibroglandular tissue again appreciated in the medial right breast.  It appears less conspicuous than it did on the prior study.  BI-RADS CATEGORY 3:  Probably benign finding(s) - short interval follow-up suggested.  RECOMMENDATION: Diagnostic right mammogram in 6  months.  I have discussed the findings and recommendations with the patient. Results were also provided in writing at the conclusion of the visit.  If applicable, a reminder letter will be sent to the patient regarding the next appointment.   Original Report Authenticated By: Esperanza Heir, M.D.   Mm Digital Diagnostic Unilat R  07/11/2012   *RADIOLOGY REPORT*  Clinical Data:  History of left mastectomy.  Short-term reevaluation right breast for probably benign asymmetry.  DIGITAL DIAGNOSTIC RIGHT MAMMOGRAM WITH CAD AND RIGHT BREAST ULTRASOUND:  Comparison:  01/10/2012, 12/29/2011, 01/26/2011, 01/18/2011, 12/27/2010, 12/13/2009  Findings:  ACR Breast Density Category 2: There is a scattered fibroglandular pattern.  The previously perceived asymmetry in the medial right breast has become less apparent.  It again appears most consistent with overlapping normal glandular tissue.  Mammographic images were processed with CAD.  On physical exam, there are no palpable abnormalities.  Ultrasound is performed, showing no abnormalities in the medial right breast.  IMPRESSION: Overlapping fibroglandular tissue again appreciated in the medial right breast.  It appears less conspicuous than it did on the prior study.  BI-RADS CATEGORY 3:  Probably benign finding(s) - short interval follow-up  suggested.  RECOMMENDATION: Diagnostic right mammogram in 6 months.  I have discussed the findings and recommendations with the patient. Results were also provided in writing at the conclusion of the visit.  If applicable, a reminder letter will be sent to the patient regarding the next appointment.   Original Report Authenticated By: Esperanza Heir, M.D.    ASSESSMENT: 42 year old female with  #1 history of stage II (T2 N2 MX) invasive ductal carcinoma of the left breast patient is status post mastectomy for multifocal disease and no positive disease largest tumor measured 2.4 cm. Tumor was ER negative PR negative HER-2/neu negative with a Ki-67 of 78%.  #2 postoperatively patient underwent adjuvant chemotherapy on NSABP B. 49 clinical trial. She received Taxotere Cytoxan every 3 weeks for a total of 6 cycles. She completed this in July 2013.  #3 patient then went on to receive adjuvant radiation therapy completing in September 2013. She had been thereafter seeing Dr. Pierce Crane every 3-6 months time.  #4 patient recently had a mammogram performed that was suspicious and therefore she did undergo biopsies in that was negative. She is recommended to have a six-month followup mammogram.   PLAN:   #1 patient will continue to be observed every 6 months now.  #2 we discussed survivorship.  #3 I did discuss exercise and healthy eating with her today. I am hoping that she will be able to do exercise and lose some weight and be about to get reconstruction in the next year or so.  #4 hot flashes she is encouraged to continue the Effexor, we also discussed yoga and tai-chi as well as acupuncture.   All questions were answered. The patient knows to call the clinic with any problems, questions or concerns. We can certainly see the patient much sooner if necessary.  I spent 30 minutes counseling the patient face to face. The total time spent in the appointment was 30 minutes.    Drue Second,  MD Medical/Oncology [facility] [phone] (beeper) [phone] (Office)  01/16/2013, 5:05 PM

## 2013-01-16 NOTE — Telephone Encounter (Signed)
appts made and printed. Pt is aware that cs will call for an appt for NM bone scan...td

## 2013-01-24 ENCOUNTER — Encounter (HOSPITAL_COMMUNITY)
Admission: RE | Admit: 2013-01-24 | Discharge: 2013-01-24 | Disposition: A | Discharge status: Home / Self Care | Payer: Commercial Managed Care - PPO | Source: Ambulatory Visit | Attending: Oncology | Admitting: Oncology

## 2013-01-24 ENCOUNTER — Ambulatory Visit (HOSPITAL_COMMUNITY)
Admission: RE | Admit: 2013-01-24 | Discharge: 2013-01-24 | Disposition: A | Discharge status: Home / Self Care | Payer: Commercial Managed Care - PPO | Source: Ambulatory Visit | Attending: Oncology | Admitting: Oncology

## 2013-01-24 DIAGNOSIS — C50919 Malignant neoplasm of unspecified site of unspecified female breast: Secondary | ICD-10-CM | POA: Insufficient documentation

## 2013-01-24 DIAGNOSIS — M4120 Other idiopathic scoliosis, site unspecified: Secondary | ICD-10-CM | POA: Insufficient documentation

## 2013-01-24 DIAGNOSIS — R0789 Other chest pain: Secondary | ICD-10-CM | POA: Insufficient documentation

## 2013-01-24 DIAGNOSIS — C50912 Malignant neoplasm of unspecified site of left female breast: Secondary | ICD-10-CM

## 2013-01-24 MED ORDER — TECHNETIUM TC 99M MEDRONATE IV KIT
25.0000 | PACK | Freq: Once | INTRAVENOUS | Status: AC | PRN
  Administered 2013-01-24: 25 via INTRAVENOUS

## 2013-02-04 ENCOUNTER — Other Ambulatory Visit (INDEPENDENT_AMBULATORY_CARE_PROVIDER_SITE_OTHER): Payer: Commercial Managed Care - PPO

## 2013-02-04 ENCOUNTER — Telehealth: Payer: Self-pay | Admitting: Emergency Medicine

## 2013-02-04 DIAGNOSIS — IMO0002 Reserved for concepts with insufficient information to code with codable children: Secondary | ICD-10-CM | POA: Diagnosis not present

## 2013-02-04 DIAGNOSIS — E1065 Type 1 diabetes mellitus with hyperglycemia: Secondary | ICD-10-CM | POA: Diagnosis not present

## 2013-02-04 LAB — MICROALBUMIN / CREATININE URINE RATIO
CREATININE, U: 120.1 mg/dL
Microalb Creat Ratio: 1.4 mg/g (ref 0.0–30.0)
Microalb, Ur: 1.7 mg/dL (ref 0.0–1.9)

## 2013-02-04 LAB — URINALYSIS, ROUTINE W REFLEX MICROSCOPIC
Bilirubin Urine: NEGATIVE
Hgb urine dipstick: NEGATIVE
Ketones, ur: NEGATIVE
LEUKOCYTES UA: NEGATIVE
Nitrite: NEGATIVE
Specific Gravity, Urine: 1.02 (ref 1.000–1.030)
Total Protein, Urine: NEGATIVE
URINE GLUCOSE: 500 — AB
Urobilinogen, UA: 1 (ref 0.0–1.0)
WBC, UA: NONE SEEN (ref 0–?)
pH: 7 (ref 5.0–8.0)

## 2013-02-04 LAB — HEMOGLOBIN A1C: Hgb A1c MFr Bld: 9.9 % — ABNORMAL HIGH (ref 4.6–6.5)

## 2013-02-04 NOTE — Telephone Encounter (Signed)
Spoke with patient; gave her results of bone scan per Charlestine Massed NP. Instructed patient that her scan was negative for cancer in her bones. Patient verbalized understanding and voiced appreciation. Encouraged patient to keep all future appointments and to call for any concerns or questions.

## 2013-02-07 ENCOUNTER — Ambulatory Visit: Payer: Commercial Managed Care - PPO | Admitting: Endocrinology

## 2013-02-07 ENCOUNTER — Encounter: Payer: Self-pay | Admitting: Endocrinology

## 2013-02-13 ENCOUNTER — Other Ambulatory Visit: Payer: Self-pay | Admitting: *Deleted

## 2013-02-13 ENCOUNTER — Ambulatory Visit (INDEPENDENT_AMBULATORY_CARE_PROVIDER_SITE_OTHER): Payer: Commercial Managed Care - PPO | Admitting: Endocrinology

## 2013-02-13 ENCOUNTER — Encounter: Payer: Self-pay | Admitting: Endocrinology

## 2013-02-13 VITALS — BP 126/82 | HR 100 | Temp 97.9°F | Resp 14 | Ht 65.0 in | Wt 201.4 lb

## 2013-02-13 DIAGNOSIS — E785 Hyperlipidemia, unspecified: Secondary | ICD-10-CM

## 2013-02-13 DIAGNOSIS — IMO0002 Reserved for concepts with insufficient information to code with codable children: Secondary | ICD-10-CM | POA: Diagnosis not present

## 2013-02-13 DIAGNOSIS — I10 Essential (primary) hypertension: Secondary | ICD-10-CM | POA: Diagnosis not present

## 2013-02-13 DIAGNOSIS — E1065 Type 1 diabetes mellitus with hyperglycemia: Secondary | ICD-10-CM

## 2013-02-13 MED ORDER — LISINOPRIL 10 MG PO TABS: 10.0000 mg | ORAL_TABLET | Freq: Every morning | ORAL | Status: DC

## 2013-02-13 NOTE — Patient Instructions (Addendum)
Check glucose 4x daily consistently  Bolus for all snacks and meals

## 2013-02-13 NOTE — Progress Notes (Signed)
Patient ID: [name], female   DOB: 16-May-1970, 43 y.o.   MRN: [id number]  [name] is an 43 y.o. female.   Reason for Appointment: Insulin Pump followup:   History of Present Illness   Diagnosis: Type 1 DIABETES MELITUS, date of diagnosis: 2002    Previous history: She generally has had poor control and A1c in the past has been consistently over 8% despite using insulin pump since 2005. This is partly related to her lack of motivation to check blood sugars consistently and also because of variability in her blood sugars and periods of hyperglycemia and hypoglycemia. Also not consistently bolusing for meals and high sugars   CURRENT insulin pump: One Touch Ping   PUMP SETTINGS are: Midnight = 1.5  4 a.m = 2.0, 6 AM = 2.6,. 9 AM = 2.4,. 12 noon = 2.10 , 5 PM = 2.8 and 8 PM = 1.6.  Carbohydrate coverage 1: 15 and sensitivity 1:40 midnight-12 noon and then 1:30 with target 120 Pump management: She is getting a total of about 58 units insulin daily with average nasal: Bolus ratio  85:15  RECENT history: Again her blood sugars are fluctuating significantly and she has no idea why Surprisingly on some days she is having perfectly normal blood sugars She cannot explain some of the readings that are even over 500 at times. However she is not checking her blood sugars consistently and on some days does not check her sugar until late afternoon Hyperglycemia: she has periodic high readings in the mornings which are unexplained  HYPOGLYCEMIC episodes: Sporadically from 7 AM until 4 PM as before She usually has symptoms of shakiness but not always symptomatic, may be able to tell her blood sugar is below 60 Her last low blood sugar was at 10 AM about 2 days ago and lowest reading 45 right after  a 2 unit bolus for lunch  GLUCOSE readings:  Early-morning: 56-335 with median 168; mid morning 53-144 Midday 80-286 with median 143 2-5 PM = 45-563 with median 122 7 PM-10 PM median 251 with  range 77-374  Problems identified:  1. Not checking blood sugars enough and rarely after meals 2. She may not be bolusing for all her meals and snacks and this is unclear since she is eating inconsistently 3. She has random low blood sugars during the day but not consistently lower dose times  4. Tends to have relatively low blood sugars between 12 noon-4 PM 5. Blood sugars are trending overall higher after 5 PM but has a few good readings also  Overall poor control as judged by A1c of 9.3 in 9/14 EXERCISE:  Minimal  LABS:  Lab Results  Component Value Date   HGBA1C 9.9* 02/04/2013   HGBA1C 9.3* 10/25/2012   HGBA1C 9.5 07/10/2012   Lab Results  Component Value Date   MICROALBUR 1.7 02/04/2013   LDLCALC 104* 10/25/2012   CREATININE 0.8 01/16/2013          Medication List       This list is accurate as of: 02/13/13  8:21 AM.  Always use your most recent med list.               acetaminophen 500 MG tablet  Commonly known as:  TYLENOL  Take 1 tablet (500 mg total) by mouth every 6 (six) hours as needed for pain.     cetirizine 10 MG tablet  Commonly known as:  ZYRTEC  Take 1 tablet (10 mg total)  by mouth daily.     Cinnamon 500 MG Tabs  Take 1,000 mg by mouth 2 (two) times daily.     fish oil-omega-3 fatty acids 1000 MG capsule  Take 2 g by mouth daily.     gabapentin 600 MG tablet  Commonly known as:  NEURONTIN  Take 600 mg by mouth 3 (three) times daily.     insulin aspart 100 UNIT/ML injection  Commonly known as:  novoLOG  Use max 80 units per day with pump, this is for a 30 day supply     insulin pump 100 unit/ml Soln  Inject 1 each into the skin continuous. Novolog insulin     lisinopril 10 MG tablet  Commonly known as:  PRINIVIL,ZESTRIL  Take 1 tablet (10 mg total) by mouth every morning. For blood pressure     ranitidine 150 MG tablet  Commonly known as:  ZANTAC  Take 1 tablet (150 mg total) by mouth 2 (two) times daily.     venlafaxine 37.5 MG  tablet  Commonly known as:  EFFEXOR  Take 1 tablet (37.5 mg total) by mouth 2 (two) times daily.     VITAMIN D (CHOLECALCIFEROL) PO  Take 1 tablet by mouth daily.        Allergies:  Allergies  Allergen Reactions  . Hydrocodone-Acetaminophen Itching and Swelling  . Insulin Glargine Itching, Nausea And Vomiting and Swelling  . Nph Iletin I [Insulin Isophane, Mixed Nph] Itching, Nausea And Vomiting and Swelling    Past Medical History  Diagnosis Date  . Breast mass in female     fibrocystic changes  . Hyperlipidemia   . Nausea   . Hypertension   . Nausea & vomiting     for approx. 2 weeks  . Cataracts, bilateral   . Diabetes mellitus     insulin dependant - uses pump, Dr. Dwyane Dee  . Breast cancer     T2N2 tiple negative left breast ca   . S/P chemotherapy, time since 4-12 weeks   . History of chemotherapy     taxotere/cytoxan with neulasta support  . Lymphedema of arm     left upper extremity  . Blood clot in vein     nonocclusive clot right subclavian vein, on Xarelto for 6 months  . Allergy   . Breast cancer   . Radiation 08/21/11-10/05/11    Left chestwll/Supraclav./left PAB/left scar    Past Surgical History  Procedure Laterality Date  . Bladder tacking  01/2009  . Shoulder surgery  04/26/10    left  . Tubal ligation  12/1999  . Left mastectomy  02/21/2011  . Portacath placement  02/21/2011    Procedure: INSERTION PORT-A-CATH;  Surgeon: Adin Hector, MD;  Location: [facility];  Service: General;  Laterality: N/A;  . Mastectomy modified radical  02/21/11    left,invasive  multifocal grade III ductal ca,high grade dcis,lymph/vascular invasion invasion (7/14) nodes positive mets  . Port-a-cath removal  10/13/2011    Procedure: MINOR REMOVAL PORT-A-CATH;  Surgeon: Adin Hector, MD;  Location: [facility];  Service: General;  Laterality: Right;  PORT-A CATH REMOVAL  . Mass excision  02/19/2012    Procedure: MINOR EXCISION OF MASS;  Surgeon: Adin Hector, MD;  Location: [facility];  Service: General;  Laterality: Left;  . Abdominal hysterectomy  2002    still has ovaries     Family History  Problem Relation Age of Onset  . Hypertension    .  Diabetes    . Deep vein thrombosis    . Kidney disease    . Cancer Maternal Aunt     breast  . Cancer Maternal Uncle 50     right eye removed s/p cancer  . Cancer Maternal Grandfather     prostate ca    Social History:  reports that she has never smoked. She has never used smokeless tobacco. She reports that she does not drink alcohol or use illicit drugs.  ROS:   She has history of hyperlipidemia, currently not on medication, last LDL 104 History of mild hypertension well controlled with lisinopril History of depression treated with Effexor History of vitamin D deficiency  EXAM:  BP 126/82  Pulse 100  Temp(Src) 97.9 F (36.6 C)  Resp 14  Ht 5\' 5"  (1.651 m)  Wt 201 lb 6.4 oz (91.354 kg)  BMI 33.51 kg/m2  SpO2 98%   ASSESSMENT:  DIABETES type 1:   She again has had poor control with A1c nearly 10% See history of present illness for detailed assessment of her current management and problems identified Currently no consistent pattern of blood sugars are seen even though she is taking somewhat more often She has periods of significant hyperglycemia especially in the evenings but not on a regular basis and this does not appear to be from infusion set issues  Currently the limiting factor is related to her control is again lack of frequent glucose monitoring  Not clear if she is more compliant with boluses Discussed that the only way to get a better idea of her blood sugar patterns and regimen will be to do a seven-day iPro study She will also need to be seen by nurse educator after this Given her information on continuous glucose monitoring and various options for newer pumps  HYPERTENSION: Well controlled  Counseling time over 50% of today's 25 minute  visit  Ahuva Poynor 02/13/2013, 8:21 AM

## 2013-02-20 ENCOUNTER — Encounter: Payer: Commercial Managed Care - PPO | Attending: Endocrinology | Admitting: *Deleted

## 2013-02-20 DIAGNOSIS — Z713 Dietary counseling and surveillance: Secondary | ICD-10-CM | POA: Diagnosis present

## 2013-02-20 DIAGNOSIS — E119 Type 2 diabetes mellitus without complications: Secondary | ICD-10-CM | POA: Diagnosis not present

## 2013-02-20 DIAGNOSIS — IMO0002 Reserved for concepts with insufficient information to code with codable children: Secondary | ICD-10-CM

## 2013-02-20 DIAGNOSIS — E1065 Type 1 diabetes mellitus with hyperglycemia: Secondary | ICD-10-CM

## 2013-02-20 NOTE — Progress Notes (Signed)
iPro Set Up  Time arrived:0800  Time left: 0830 Patient here for placement of iPro2 Continuous Glucose Monitor  Explained to patient purpose of wearing the iPro per MD orders. They expressed verbal understanding.  Sensor inserted into skin at least 2 inches from any insulin injection sites. Patient instructed to check BG at least 4 times each day. Explained to patient how to complete the iPro2 Log Sheet including time of all BG, food intake, exercise and any diabetes medications.  Patient instructed to return on Tuesday, 02/25/13 by 5 PM for removal of sensor and iPro2 along with the completed Log Sheet  iPro2 Recorder attached to sensor and taped down.  Patient informed that when iPro2 and sensor are connected, the system is waterproof and that they are to wear it consistently until they return to this office.  Patient to call this office if any questions or concerns prior to appointment to return the iPro2 for downloading.

## 2013-02-26 ENCOUNTER — Telehealth: Payer: Self-pay | Admitting: Endocrinology

## 2013-02-26 ENCOUNTER — Other Ambulatory Visit: Payer: Self-pay | Admitting: Endocrinology

## 2013-03-01 ENCOUNTER — Other Ambulatory Visit: Payer: Self-pay | Admitting: Oncology

## 2013-03-27 ENCOUNTER — Ambulatory Visit: Payer: Commercial Managed Care - PPO | Admitting: Endocrinology

## 2013-04-01 ENCOUNTER — Encounter: Payer: Self-pay | Admitting: Endocrinology

## 2013-04-01 ENCOUNTER — Ambulatory Visit (INDEPENDENT_AMBULATORY_CARE_PROVIDER_SITE_OTHER): Payer: Commercial Managed Care - PPO | Admitting: Endocrinology

## 2013-04-01 VITALS — BP 126/88 | HR 90 | Temp 98.6°F | Resp 16 | Ht 65.0 in | Wt 208.2 lb

## 2013-04-01 DIAGNOSIS — I10 Essential (primary) hypertension: Secondary | ICD-10-CM | POA: Diagnosis not present

## 2013-04-01 DIAGNOSIS — IMO0002 Reserved for concepts with insufficient information to code with codable children: Secondary | ICD-10-CM

## 2013-04-01 DIAGNOSIS — E1065 Type 1 diabetes mellitus with hyperglycemia: Secondary | ICD-10-CM

## 2013-04-01 DIAGNOSIS — E785 Hyperlipidemia, unspecified: Secondary | ICD-10-CM

## 2013-04-01 NOTE — Progress Notes (Addendum)
Patient ID: [name], female   DOB: 11/18/1970, 43 y.o.   MRN: [id number]   Reason for Appointment: Insulin Pump followup:   History of Present Illness   Diagnosis: Type 1 DIABETES MELITUS, date of diagnosis: 2002    Previous history: She generally has had poor control and A1c in the past has been consistently over 8% despite using insulin pump since 2005. This is partly related to her lack of motivation to check blood sugars consistently and also because of variability in her blood sugars and periods of hyperglycemia and hypoglycemia. Also not consistently bolusing for meals and high sugars   CURRENT insulin pump: One Touch Ping   PUMP SETTINGS are: Midnight = 1.7 4 a.m = 2.1, 6 AM = 2.6,. 9 AM = 2.4,. 12 noon = 2.10 , 5 PM = 2.8 and 8 PM = 1.6.  Carbohydrate coverage 1:15 and sensitivity 1:40 midnight-12 noon and then 1:30 with target 120 Pump management: She is getting a total of about 58 units insulin daily with average nasal: Bolus ratio  85:15  RECENT history:  Since her blood sugars on the continuous glucose monitoring were higher overnight her basal rate was increased at midnight and also by 0.1 at 4 AM However for some reason she is starting to get excessively low sugars around 9 AM recently and also some around 7-8 AM Although she is trying to do more glucose monitoring she is checking only on an average 2.2 times a day BOLUSES are only 1.4 times a day and she again claims that she is not eating more than one meal a day frequently; on one of the day she has no boluses at all Recently her blood sugars are fluctuating more in the afternoon and has occasional high readings for no apparent reason A1c was last done about 2 months ago  HYPOGLYCEMIC episodes: As above She usually has symptoms of shakiness but not always symptomatic, may be able to tell her blood sugar is below 50-60  GLUCOSE readings:   PREMEAL Breakfast  midday  afternoon   evening  Overall  Glucose range:   43-239   53-126  71-449  56-300   43-449   Mean/median:      112+/-106     CGM RECORD INTERPRETATION    Dates of Recording: 1/22 through 02/23/13        Indications: Poor glycemic control. Variable blood sugars and unpredictable hyperglycemia and hypoglycemia. Identification of postprandial patterns and patterns of overnight glycemia     Sensor  summary:  Quality of the data is good with adequate sensor function between 1/22 and 1/24. Glucose excursion profile shows too high excursions on 1/22 and 4 on 1/23. She had a low excretion on each of the days also.     Glycemic patterns:  Blood sugars were higher during the night and slowly rising in the evenings but not consistently. Not enough data for a consistent interpretation     Overnight periods:  Blood sugars were rising and persistently high after about 11 PM on both of the nights and more so on 1/24 with blood sugar going up to over 400 at about 5-6 AM     Preprandial periods:  Blood sugars were fairly good on 11/22 and 01/2312 take around midday and mildly high on 1/23 at suppertime, did have a new glucose readings that for about 200 before lunch and dinner on 1/25     Postprandial periods:  Mild gradual rise in blood sugar after dinner  on 1/22 and slightly more on 1/23 with a starchy meal of potato wedges     Hypoglycemia: Only one episode on 1/23 at about 1:20 p.m. without any significant bolus prior to this. She had a visit to the chiropractor about 5 hours previously     Recommendations Check blood sugars occasionally during the night because of tendency to high readings. Increase basal rate at midnight to 1.7 and 4 AM to 2.1 a. Recommend using personal continuous glucose monitoring system for day-to-day management. More information needed about predisposing factors for hypoglycemia. Use correction factor of 1:25 instead of 1:30 to help control hyperglycemia better    EXERCISE:  Minimal  LABS:  Lab Results  Component Value Date    HGBA1C 9.9* 02/04/2013   HGBA1C 9.3* 10/25/2012   HGBA1C 9.5 07/10/2012   Lab Results  Component Value Date   MICROALBUR 1.7 02/04/2013   LDLCALC 104* 10/25/2012   CREATININE 0.8 01/16/2013          Medication List       This list is accurate as of: 04/01/13  4:53 PM.  Always use your most recent med list.               acetaminophen 500 MG tablet  Commonly known as:  TYLENOL  Take 1 tablet (500 mg total) by mouth every 6 (six) hours as needed for pain.     cetirizine 10 MG tablet  Commonly known as:  ZYRTEC  Take 1 tablet (10 mg total) by mouth daily.     Cinnamon 500 MG Tabs  Take 1,000 mg by mouth 2 (two) times daily.     fish oil-omega-3 fatty acids 1000 MG capsule  Take 2 g by mouth daily.     gabapentin 600 MG tablet  Commonly known as:  NEURONTIN  Take 600 mg by mouth 3 (three) times daily.     insulin aspart 100 UNIT/ML injection  Commonly known as:  novoLOG  Use max 80 units per day with pump, this is for a 30 day supply     insulin pump Soln  Inject 1 each into the skin continuous. Novolog insulin     lisinopril 10 MG tablet  Commonly known as:  PRINIVIL,ZESTRIL  Take 1 tablet (10 mg total) by mouth every morning. For blood pressure     ranitidine 150 MG tablet  Commonly known as:  ZANTAC  Take 1 tablet (150 mg total) by mouth 2 (two) times daily.     venlafaxine 37.5 MG tablet  Commonly known as:  EFFEXOR  TAKE ONE TABLET BY MOUTH TWICE DAILY     VITAMIN D (CHOLECALCIFEROL) PO  Take 1 tablet by mouth daily.        Allergies:  Allergies  Allergen Reactions  . Hydrocodone-Acetaminophen Itching and Swelling  . Insulin Glargine Itching, Nausea And Vomiting and Swelling  . Nph Iletin I [Insulin Isophane, Mixed Nph] Itching, Nausea And Vomiting and Swelling    Past Medical History  Diagnosis Date  . Breast mass in female     fibrocystic changes  . Hyperlipidemia   . Nausea   . Hypertension   . Nausea & vomiting     for approx. 2 weeks   . Cataracts, bilateral   . Diabetes mellitus     insulin dependant - uses pump, Dr. Dwyane Dee  . Breast cancer     T2N2 tiple negative left breast ca   . S/P chemotherapy, time since 4-12 weeks   . History  of chemotherapy     taxotere/cytoxan with neulasta support  . Lymphedema of arm     left upper extremity  . Blood clot in vein     nonocclusive clot right subclavian vein, on Xarelto for 6 months  . Allergy   . Breast cancer   . Radiation 08/21/11-10/05/11    Left chestwll/Supraclav./left PAB/left scar    Past Surgical History  Procedure Laterality Date  . Bladder tacking  01/2009  . Shoulder surgery  04/26/10    left  . Tubal ligation  12/1999  . Left mastectomy  02/21/2011  . Portacath placement  02/21/2011    Procedure: INSERTION PORT-A-CATH;  Surgeon: Adin Hector, MD;  Location: [facility];  Service: General;  Laterality: N/A;  . Mastectomy modified radical  02/21/11    left,invasive  multifocal grade III ductal ca,high grade dcis,lymph/vascular invasion invasion (7/14) nodes positive mets  . Port-a-cath removal  10/13/2011    Procedure: MINOR REMOVAL PORT-A-CATH;  Surgeon: Adin Hector, MD;  Location: [facility];  Service: General;  Laterality: Right;  PORT-A CATH REMOVAL  . Mass excision  02/19/2012    Procedure: MINOR EXCISION OF MASS;  Surgeon: Adin Hector, MD;  Location: [facility];  Service: General;  Laterality: Left;  . Abdominal hysterectomy  2002    still has ovaries     Family History  Problem Relation Age of Onset  . Hypertension    . Diabetes    . Deep vein thrombosis    . Kidney disease    . Cancer Maternal Aunt     breast  . Cancer Maternal Uncle 50     right eye removed s/p cancer  . Cancer Maternal Grandfather     prostate ca    Social History:  reports that she has never smoked. She has never used smokeless tobacco. She reports that she does not drink alcohol or use illicit drugs.  ROS:   She has history of  hyperlipidemia, currently not on medication  Lab Results  Component Value Date   CHOL 194 10/25/2012   HDL 80.80 10/25/2012   LDLCALC 104* 10/25/2012   LDLDIRECT 92 12/15/2009   TRIG 47.0 10/25/2012   CHOLHDL 2 10/25/2012    History of mild hypertension treated with lisinopril History of depression treated with Effexor History of vitamin D deficiency  Has lymphedema on the left arm  EXAM:  BP 126/88  Pulse 90  Temp(Src) 98.6 F (37 C)  Resp 16  Ht 5\' 5"  (1.651 m)  Wt 208 lb 3.2 oz (94.439 kg)  BMI 34.65 kg/m2  SpO2 99%   ASSESSMENT:  DIABETES type 1:   She appears to have overall better readings more recently although objectively not assessed with A1c or fructosamine See history of present illness for detailed assessment of her current management and problems identified  Problems identified:  1. Not checking blood sugars enough and rarely after meals 2. She may not be bolusing for all her meals and snacks and this is unclear since she is eating inconsistently 3. She has some degree of hypoglycemia unawareness  4. Tends to have significant rebound from hypoglycemia 5. has only occasional high readings, once after a meal.  Recommendations made today:  Start checking blood sugars more often, at least 3 times a day  Take boluses for all meals and snacks regardless of whether blood sugars checked or not  Take boluses for high readings  Avoid over treating low blood sugars  She will be instructed on continuous glucose monitoring using the DexCom by nurse educator and will review her patterns in another 6 weeks  HYPERTENSION: Will reassess her blood pressure on the next visit as it is somewhat higher than usual  Counseling time over 50% of today's 25 minute visit  [name] 04/01/2013, 4:53 PM    Addendum: She will lower her basal rates as discussed and also does correction factor 1: 30 around-the-clock

## 2013-04-01 NOTE — Patient Instructions (Signed)
Weekly 3 am reading  Reduce basal at 6 and 9 am  MUST CHECK 3-4 X DAILY; if sugar high do a correction  Boluses at each meal or snack even if not testing

## 2013-04-02 ENCOUNTER — Encounter: Payer: Commercial Managed Care - PPO | Attending: Endocrinology | Admitting: Nutrition

## 2013-04-02 DIAGNOSIS — E1065 Type 1 diabetes mellitus with hyperglycemia: Secondary | ICD-10-CM

## 2013-04-02 DIAGNOSIS — E119 Type 2 diabetes mellitus without complications: Secondary | ICD-10-CM | POA: Insufficient documentation

## 2013-04-02 DIAGNOSIS — IMO0002 Reserved for concepts with insufficient information to code with codable children: Secondary | ICD-10-CM

## 2013-04-02 DIAGNOSIS — Z713 Dietary counseling and surveillance: Secondary | ICD-10-CM | POA: Insufficient documentation

## 2013-04-02 NOTE — Patient Instructions (Signed)
Do 2 calibrations blood sugar readings in 2 hours. Do a calibration blood sugar every 12 hours.  Call if questions.

## 2013-04-02 NOTE — Progress Notes (Signed)
We switched this patient from the [facility] to [facility] with a Dexcom sensor. Her basal rates and carb calculations were taken from her old Pump:  Basal rate: MN: 1.7, 4AM: 2.1,  6AM: 2.4,  9AM: 2.3,  12PM: 2.1,  5PM: 2.8,  10PM: 1.6.      I/C ratio: 15, Sensitivity: 30, Timing: 4hr. ,  Target: 110-130. We inserted a Dexcom sensor without difficulty.  She will do 2 calibrations in 2 hours.  She was shown how to do this and reported good understanding of how to do this, and the need to do this q 12 hours.   We discussed the differences between the old and new pumps and she reported good understanding of this, and had no final questions.  She inserted a new cartridge without any difficulty, and  she signed off on understanding all topics discussed and was given a copy of her pump settings.

## 2013-04-08 NOTE — Telephone Encounter (Signed)
done

## 2013-04-12 IMAGING — CT CT ANGIO CHEST
2 of 7 series · 18 of 46 positions shown · IV contrast (APPLIED)
Comparison: PET CT 02/13/2011.

CLINICAL DATA: Right subclavian DVT by report, history of left
breast cancer for which the patient underwent mastectomy.
Shortness of breath and tachycardia currently.

CT ANGIOGRAPHY CHEST
TECHNIQUE: Multidetector CT imaging of the chest using the
standard protocol during bolus administration of intravenous
contrast. Multiplanar reconstructed images including MIPs were
obtained and reviewed to evaluate the vascular anatomy.
Contrast: 100mL OMNIPAQUE IOHEXOL 350 MG/ML SOLN

[Series 6: pulm embolism 1.0 b25f thin · axial · 0.65mm/px · z∈[+1079,+1271]mm · 15 of 217 slices shown]
[im 13/217  lung]
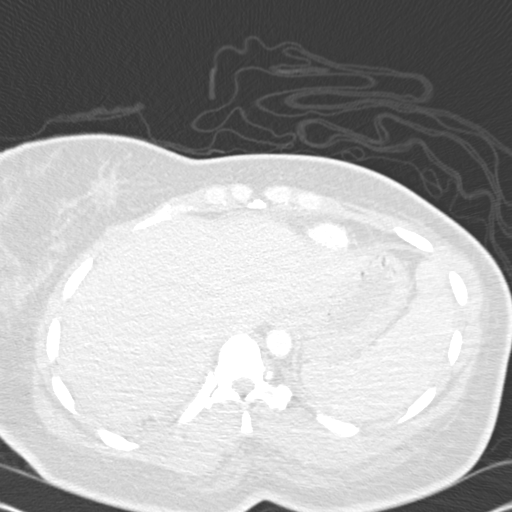
[im 25/217  soft-tissue]
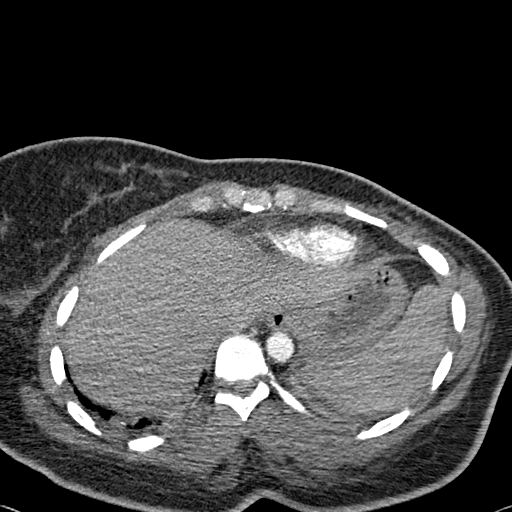
[im 37/217  lung]
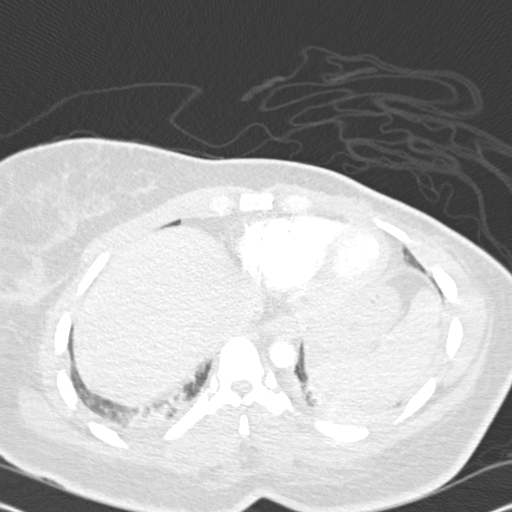
[im 49/217  soft-tissue]
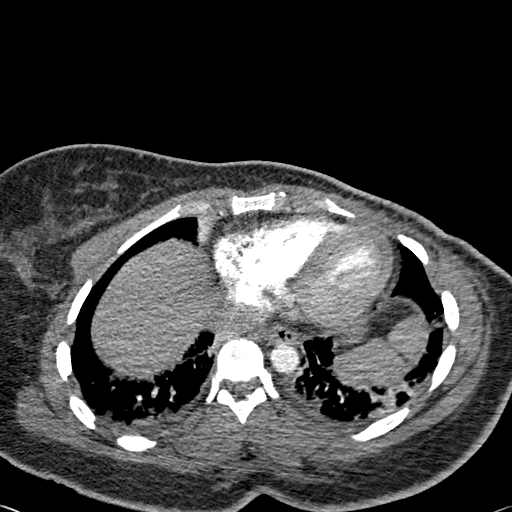
[im 73/217  lung]
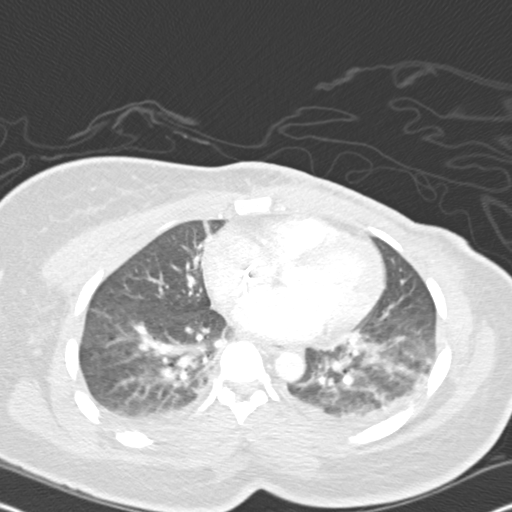
[im 85/217  soft-tissue]
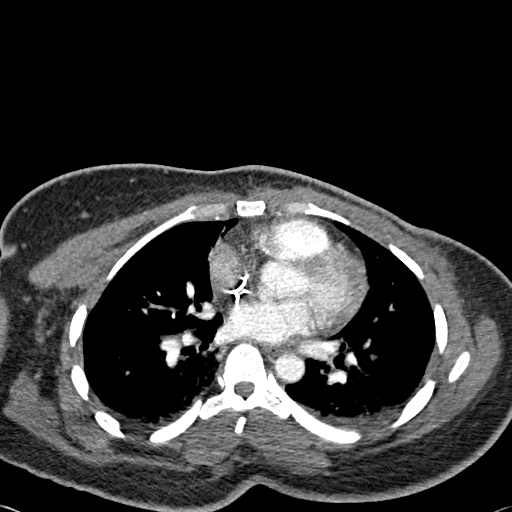
[im 97/217  lung]
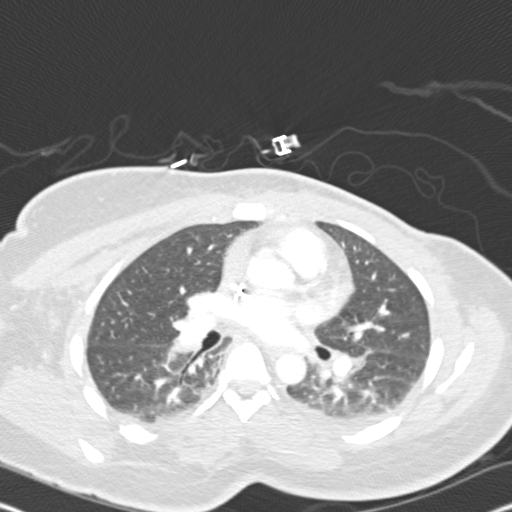
[im 109/217  soft-tissue]
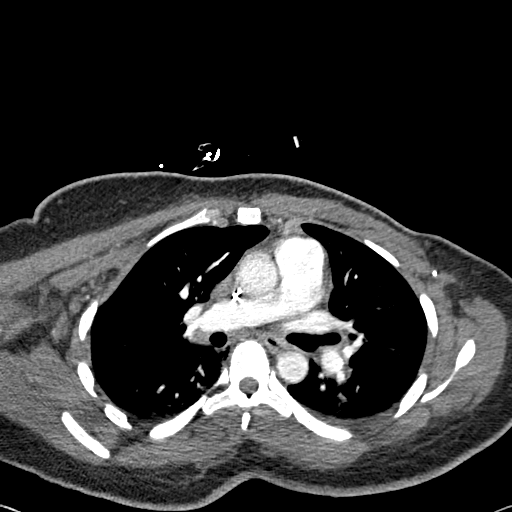
[im 121/217  lung]
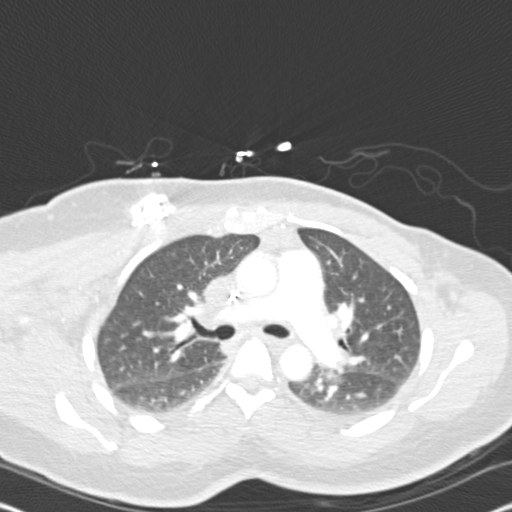
[im 133/217  soft-tissue]
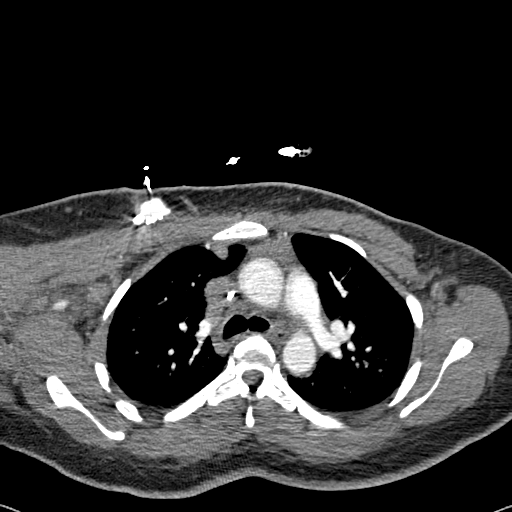
[im 145/217  lung]
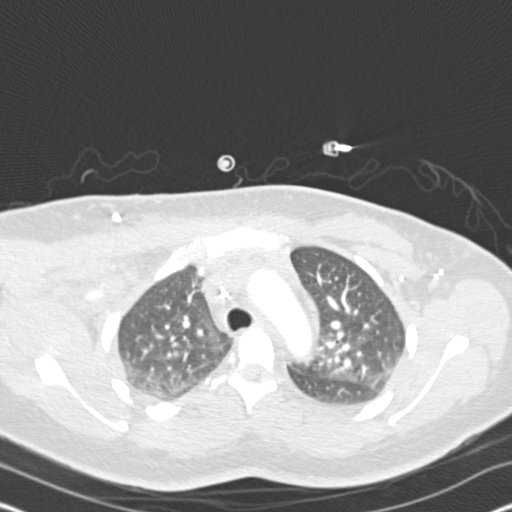
[im 169/217  soft-tissue]
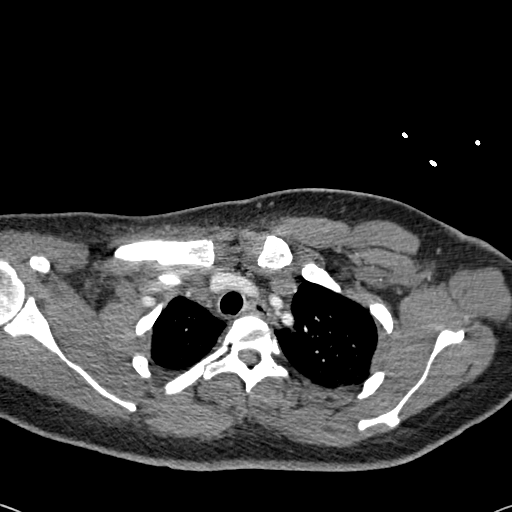
[im 181/217  lung]
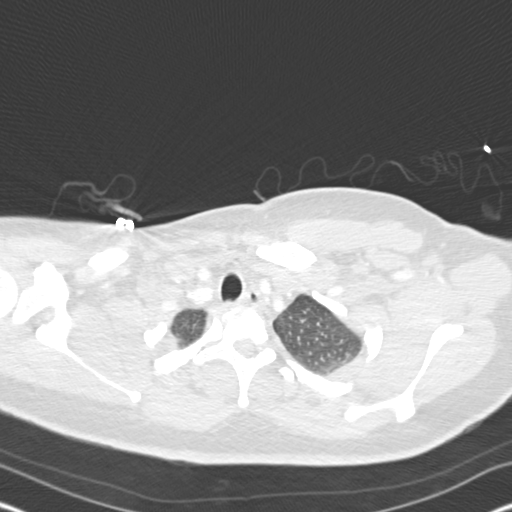
[im 193/217  soft-tissue]
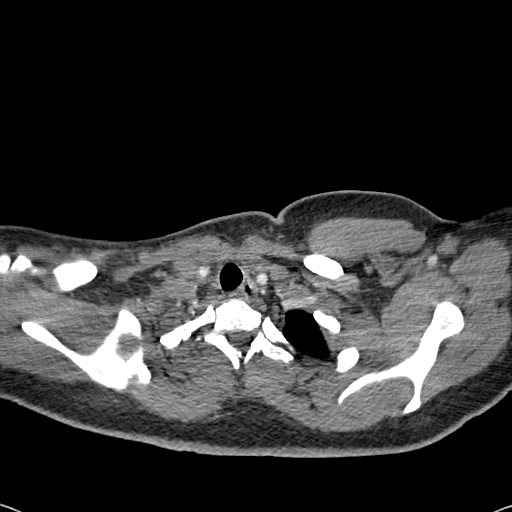
[im 205/217  lung]
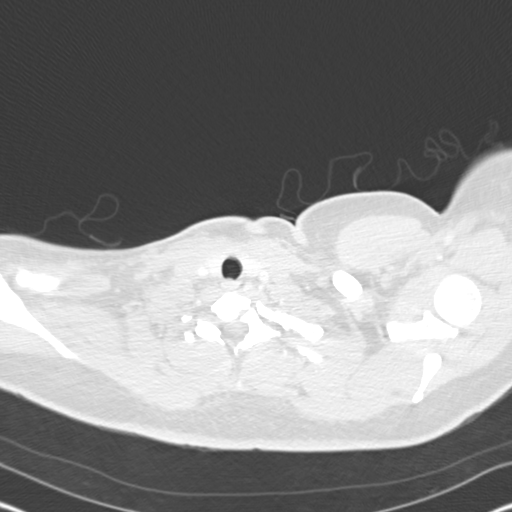

[Series 602: coronal · coronal · 0.65mm/px · 3 of 106 slices shown]
[im 27/106  soft-tissue]
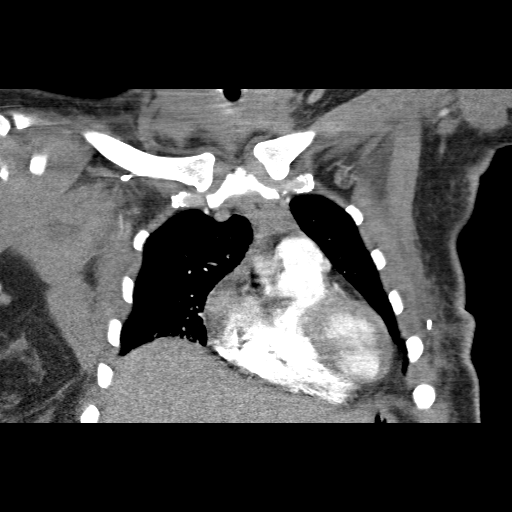
[im 53/106  soft-tissue]
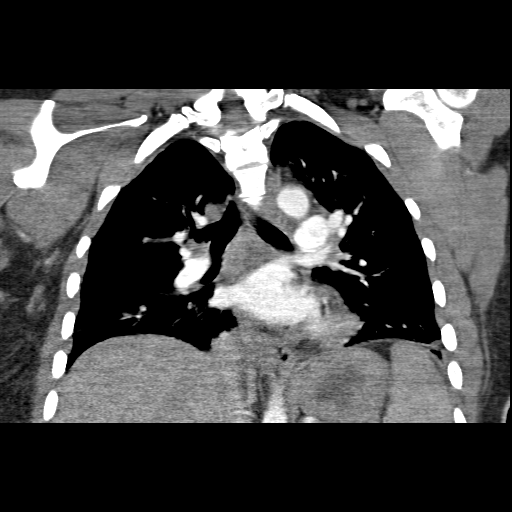
[im 79/106  soft-tissue]
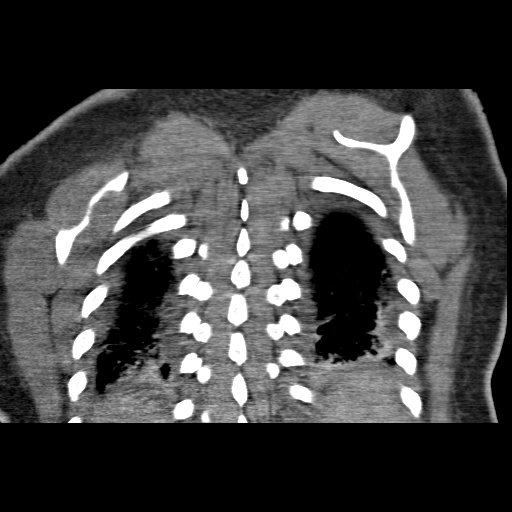

[18 of 46 positions shown; findings below may reference images not displayed]

FINDINGS: Contrast opacification of the pulmonary arteries is good.
Respiratory motion blurred images of the bases, and lung volumes
are low.  Overall, the study is of moderate to good diagnostic
quality.

No filling defects within either main pulmonary artery or their
branches in either lung to suggest pulmonary embolism.  Heart size
upper normal, with a borderline left ventricular enlargement.
Right subclavian Port-A-Cath tip in the upper right atrium.  No
pericardial effusion.  No visible coronary artery calcification.
No visible atherosclerosis involving the thoracic or upper
abdominal aorta or their branches.  Direct origin of the left
vertebral artery from the aortic arch. On delayed images, the SVC
is widely patent.  Pain nonocclusive small filling defect is noted
in the right subclavian vein.

Low lung volumes with dependent atelectasis in the lower lobes and
in the posterior upper lobes.  Lungs otherwise clear.  No pulmonary
parenchymal nodules or masses.  No pleural effusions.  Minimal
pleural thickening bilaterally.  Central airways patent without
significant bronchial wall thickening.

Thymic tissue in the anterior-superior mediastinum.  Surgical clips
left axilla from prior node dissection.  No enlarged lymph nodes
and the mediastinum, either hilum, or either axilla.  Left
mastectomy without evidence of recurrent tumor in the left chest
wall.

Visualized extreme upper abdomen unremarkable.  Bone window images
demonstrate no evidence of osseous metastatic disease.IMPRESSION:

1.  No evidence of pulmonary embolism.
2.  Low lung volumes with atelectasis in the lower lobes and
posterior upper lobes.  No acute cardiopulmonary disease otherwise.
3.  No evidence of metastatic disease in the chest.
4.  Nonocclusive clot in the right subclavian vein.  Widely patent
SVC.

## 2013-04-25 ENCOUNTER — Ambulatory Visit
Admission: RE | Admit: 2013-04-25 | Discharge: 2013-04-25 | Disposition: A | Discharge status: Home / Self Care | Payer: Commercial Managed Care - PPO | Source: Ambulatory Visit | Attending: Family Medicine | Admitting: Family Medicine

## 2013-04-25 ENCOUNTER — Ambulatory Visit (INDEPENDENT_AMBULATORY_CARE_PROVIDER_SITE_OTHER): Payer: Commercial Managed Care - PPO | Admitting: Sports Medicine

## 2013-04-25 VITALS — BP 128/78 | HR 92 | Temp 98.0°F | Ht 65.0 in | Wt 208.7 lb

## 2013-04-25 DIAGNOSIS — E1065 Type 1 diabetes mellitus with hyperglycemia: Secondary | ICD-10-CM

## 2013-04-25 DIAGNOSIS — J069 Acute upper respiratory infection, unspecified: Secondary | ICD-10-CM

## 2013-04-25 DIAGNOSIS — IMO0002 Reserved for concepts with insufficient information to code with codable children: Secondary | ICD-10-CM

## 2013-04-25 MED ORDER — BENZONATATE 200 MG PO CAPS: 200.0000 mg | ORAL_CAPSULE | Freq: Two times a day (BID) | ORAL | Status: DC | PRN

## 2013-04-25 NOTE — Patient Instructions (Addendum)
Please to get a chest x-ray at [place].  I have sent in a prescription for Tussionex for your cough.  You have cold; typical cold symptoms can last for up to 10-14 days but should be continually improving after the 5th day.  Please continue to drink plenty of fluids Take acetaminophen (Tylenol) 1X500mg  or 2X325mg  tablets every 8 hours  Take Ibuprofen (Advil or Motrin) 2-3 200mg  tablets every 8 hours.  Alternatively, you can take 2 Aleve (naproxen 220 mg) twice per day. Try to alternate the acetaminophen and ibuprofen so that you take one every 4 hours Honey has been shown to help with cough.  You can try mixing  a teaspoon of honey in warm water or caffeine free herbal tea before bedtime.

## 2013-04-25 NOTE — Progress Notes (Signed)
[name] - 43 y.o. female MRN [id number]  Date of birth: Jul 08, 1970  SUBJECTIVE:     CC: URI See problem based charting for additional subjective (including HPI, Interval History & ROS)   HISTORY:  Recent Labs  07/10/12 0919 10/25/12 1433 02/04/13 0854  HGBA1C 9.5 9.3* 9.9*  TRIG  --  47.0  --   CHOL  --  194  --   HDL  --  80.80  --   LDLCALC  --  104*  --   TSH  --  0.85  --   FREET4  --  0.91  --   } Wt Readings from Last 3 Encounters:  04/25/13 208 lb 11.2 oz (94.666 kg)  04/01/13 208 lb 3.2 oz (94.439 kg)  02/13/13 201 lb 6.4 oz (91.354 kg)   BP Readings from Last 3 Encounters:  04/25/13 128/78  04/01/13 126/88  02/13/13 126/82    History  Smoking status  . Never Smoker   Smokeless tobacco  . Never Used   Health Maintenance Due  Topic  . Foot Exam     Otherwise past Medical, Surgical, Social, and Family History Reviewed per EMR Medications and Allergies reviewed and updated per below.  VITALS: BP 128/78  Pulse 92  Temp(Src) 98 F (36.7 C) (Oral)  Ht 5\' 5"  (1.651 m)  Wt 208 lb 11.2 oz (94.666 kg)  BMI 34.73 kg/m2  PHYSICAL EXAM: GENERAL:  adult [place] American female. In no discomfort; no respiratory distress  PSYCH: alert and appropriate, good insight   HNEENT:  no JVD, no posterior or pharyngeal exudate however some streaking erythema.  Bilateral tympanic membranes pearly gray without erythema   CARDIO: RRR, S1/S2 heard, no murmur  LUNGS: CTA B, no wheezes, faint by basilar crackles  ABDOMEN:   EXTREM:  Warm, well perfused.  Moves all 4 extremities spontaneously; no lateralization.  No noted foot lesions.  Distal pulses 1+/4.  Trace pretibial edema.  MSK   right-sided rib prominence with tenderness to palpation over right ribs 2 through 6.  No crepitation and good movement.    SKIN:     MEDICATIONS, LABS & OTHER ORDERS: Previous Medications   ACETAMINOPHEN (TYLENOL) 500 MG TABLET    Take 1 tablet (500 mg total) by mouth every 6 (six) hours  as needed for pain.   CETIRIZINE (ZYRTEC) 10 MG TABLET    Take 1 tablet (10 mg total) by mouth daily.   CINNAMON 500 MG TABS    Take 1,000 mg by mouth 2 (two) times daily.   FISH OIL-OMEGA-3 FATTY ACIDS 1000 MG CAPSULE    Take 2 g by mouth daily.   GABAPENTIN (NEURONTIN) 600 MG TABLET    Take 600 mg by mouth 3 (three) times daily.   INSULIN ASPART (NOVOLOG) 100 UNIT/ML INJECTION    Use max 80 units per day with pump, this is for a 30 day supply   INSULIN HUMAN (INSULIN PUMP) 100 UNIT/ML SOLN    Inject 1 each into the skin continuous. Novolog insulin   LISINOPRIL (PRINIVIL,ZESTRIL) 10 MG TABLET    Take 1 tablet (10 mg total) by mouth every morning. For blood pressure   RANITIDINE (ZANTAC) 150 MG TABLET    Take 1 tablet (150 mg total) by mouth 2 (two) times daily.   VENLAFAXINE (EFFEXOR) 37.5 MG TABLET    TAKE ONE TABLET BY MOUTH TWICE DAILY   VITAMIN D, CHOLECALCIFEROL, PO    Take 1 tablet by mouth daily.  Modified Medications   No medications on file   New Prescriptions   BENZONATATE (TESSALON) 200 MG CAPSULE    Take 1 capsule (200 mg total) by mouth 2 (two) times daily as needed for cough.   Discontinued Medications   No medications on file   Orders Placed This Encounter  Procedures  . DG Chest 2 View   ASSESSMENT & PLAN: See problem based charting & AVS for pt instructions.

## 2013-04-28 NOTE — Assessment & Plan Note (Signed)
Problem Based Documentation:    Subjective Report:  Patient reports six-day history of cough, congestion, headache, malaise, fatigue.  Denies significant dyspnea, chest pain, nausea, vomiting, diarrhea, melena or hematochezia.  She reports some associated right-sided back pain  She has been taking Tylenol and Mucinex as needed without significant improvement.    heard nonproductive cough is now affecting her sleep      Assessment & Plan & Follow up Issues:   acute  condition 1.  Given comorbid conditions and faint crackles check chest x-ray 2. Symptomatic treatment; red flags reviewed.  Back pain could be from a potential rib fracture but no evidence on x-ray > assure improvement  .

## 2013-05-13 ENCOUNTER — Other Ambulatory Visit (INDEPENDENT_AMBULATORY_CARE_PROVIDER_SITE_OTHER): Payer: Commercial Managed Care - PPO

## 2013-05-13 DIAGNOSIS — IMO0002 Reserved for concepts with insufficient information to code with codable children: Secondary | ICD-10-CM | POA: Diagnosis not present

## 2013-05-13 DIAGNOSIS — E1065 Type 1 diabetes mellitus with hyperglycemia: Secondary | ICD-10-CM

## 2013-05-13 LAB — COMPREHENSIVE METABOLIC PANEL
ALT: 33 U/L (ref 0–35)
AST: 32 U/L (ref 0–37)
Albumin: 3.9 g/dL (ref 3.5–5.2)
Alkaline Phosphatase: 137 U/L — ABNORMAL HIGH (ref 39–117)
BUN: 18 mg/dL (ref 6–23)
CHLORIDE: 105 meq/L (ref 96–112)
CO2: 28 mEq/L (ref 19–32)
Calcium: 9.6 mg/dL (ref 8.4–10.5)
Creatinine, Ser: 0.8 mg/dL (ref 0.4–1.2)
GFR: 95.8 mL/min (ref >60.00)
Glucose, Bld: 145 mg/dL — ABNORMAL HIGH (ref 70–99)
POTASSIUM: 4.2 meq/L (ref 3.5–5.1)
Sodium: 139 mEq/L (ref 135–145)
Total Bilirubin: 0.5 mg/dL (ref 0.3–1.2)
Total Protein: 7.4 g/dL (ref 6.0–8.3)

## 2013-05-13 LAB — LIPID PANEL
Cholesterol: 206 mg/dL — ABNORMAL HIGH (ref 0–200)
HDL: 81.7 mg/dL (ref >39.00)
LDL Cholesterol: 113 mg/dL — ABNORMAL HIGH (ref 0–99)
Total CHOL/HDL Ratio: 3
Triglycerides: 55 mg/dL (ref 0.0–149.0)
VLDL: 11 mg/dL (ref 0.0–40.0)

## 2013-05-13 LAB — HEMOGLOBIN A1C: Hgb A1c MFr Bld: 8.8 % — ABNORMAL HIGH (ref 4.6–6.5)

## 2013-05-13 LAB — MICROALBUMIN / CREATININE URINE RATIO
CREATININE, U: 173.7 mg/dL
MICROALB UR: 5.5 mg/dL — AB (ref 0.0–1.9)
Microalb Creat Ratio: 3.2 mg/g (ref 0.0–30.0)

## 2013-05-16 ENCOUNTER — Encounter: Payer: Self-pay | Admitting: Endocrinology

## 2013-05-16 ENCOUNTER — Ambulatory Visit (INDEPENDENT_AMBULATORY_CARE_PROVIDER_SITE_OTHER): Payer: Commercial Managed Care - PPO | Admitting: Endocrinology

## 2013-05-16 VITALS — BP 118/76 | HR 83 | Temp 97.9°F | Resp 16 | Ht 65.0 in | Wt 207.0 lb

## 2013-05-16 DIAGNOSIS — E785 Hyperlipidemia, unspecified: Secondary | ICD-10-CM | POA: Diagnosis not present

## 2013-05-16 DIAGNOSIS — E1065 Type 1 diabetes mellitus with hyperglycemia: Secondary | ICD-10-CM | POA: Diagnosis not present

## 2013-05-16 DIAGNOSIS — I10 Essential (primary) hypertension: Secondary | ICD-10-CM | POA: Diagnosis not present

## 2013-05-16 DIAGNOSIS — IMO0002 Reserved for concepts with insufficient information to code with codable children: Secondary | ICD-10-CM

## 2013-05-16 NOTE — Patient Instructions (Signed)
BOLUS EVERY time sugar >200  Check sugars 4x daily

## 2013-05-16 NOTE — Progress Notes (Signed)
Patient ID: [name], female   DOB: 1970-08-08, 43 y.o.   MRN: [id number]   Reason for Appointment: Insulin Pump followup:   History of Present Illness   Diagnosis: Type 1 DIABETES MELITUS, date of diagnosis: 2002    Previous history: She generally has had poor control and A1c in the past has been consistently over 8% despite using insulin pump since 2005. This is partly related to her lack of motivation to check blood sugars consistently and also because of variability in her blood sugars and periods of hyperglycemia and hypoglycemia. Also not consistently bolusing for meals and high sugars   CURRENT insulin pump: One Touch Ping   PUMP SETTINGS are: Midnight = 1.7 4 a.m = 2.1, 6 AM = 2.6,. 9 AM = 2.4,. 12 noon = 2.10 , 5 PM = 2.8 and 8 PM = 1.6.  Carbohydrate coverage 1:15 and sensitivity 1:30 with target 120 Pump management: She is getting a total of about 58 units insulin daily with average nasal: Bolus ratio  85:15  RECENT history:  Since starting continuous glucose monitoring with her pump she has been able to avoid hypoglycemia better because of the alarms However her blood sugars are overall high Again she is having the same problems with control as follows: 1. Not checking blood sugars enough and not after meals. Checking blood sugars only 1.3 times a day 2. She may not be bolusing for all her snacks and on several days has no boluses at all 3. She has not been bolusing for high readings with the continuous glucose monitoring and her high alarm is set at 260  4. Tends to have  persistently high readings during the night 5. Even though correction factor was changed to 1:30 she may not be getting adequate correction of high readings although difficult to identify the blood sugars after doing her correction boluses  BOLUSES are only 1.3 times a day and on some days none at all.  HYPOGLYCEMIC episodes: Minimal and occurring either around 9 AM or 2 PM, 6 PM. Lowest recorded sugar  is 52 and on her CGM is 48 She usually has symptoms of shakiness but not always symptomatic, may be able to tell her blood sugar is below 50-60  GLUCOSE readings: Checking 1.3 times a day  PREMEAL Breakfast Lunch Dinner overnight  Overall  Glucose range:  75-353  ?   52- 325   331, 389    Mean/median:  220      232    CGM RECORD INTERPRETATION    Dates of Recording: 05/11/13-05/16/13          Sensor  summary:  Quality of the data is good with adequate sensor function. Glucose values were 241 per day with overall average 235+/-82, majority above 180 and only 63 value is below 70      Glycemic patterns:   blood sugars are fairly consistently high throughout the day except between about 8 AM-12 noon with the highest level being after midnight until 5 AM      Overnight periods:   her blood sugars are fairly consistently high on all of the recordings at least part of the night. The highest level being after midnight until 5 AM with average blood sugars ranging from 213-294 between midnight-7 AM      Preprandial periods:   fasting blood sugars are averaging about 170-190 and blood sugars at suppertime or generally high with one exception      Postprandial periods:  difficult bilateral as she is eating meals at inconsistent times and not entering carbohydrates on some days at all. However most of her meals are 8-9 AM and blood sugars after meals are not high are unusually low      Hypoglycemia:  has had only occasional transient hypoglycemia on 2 occasions around 8-10 AM and also early afternoon, repeat low sugar was on Thursday       EXERCISE:  Minimal  LABS:  Lab Results  Component Value Date   HGBA1C 8.8* 05/13/2013   HGBA1C 9.9* 02/04/2013   HGBA1C 9.3* 10/25/2012   Lab Results  Component Value Date   MICROALBUR 5.5* 05/13/2013   LDLCALC 113* 05/13/2013   CREATININE 0.8 05/13/2013          Medication List       This list is accurate as of: 05/16/13 10:06 AM.  Always use your  most recent med list.               acetaminophen 500 MG tablet  Commonly known as:  TYLENOL  Take 1 tablet (500 mg total) by mouth every 6 (six) hours as needed for pain.     benzonatate 200 MG capsule  Commonly known as:  TESSALON  Take 1 capsule (200 mg total) by mouth 2 (two) times daily as needed for cough.     cetirizine 10 MG tablet  Commonly known as:  ZYRTEC  Take 1 tablet (10 mg total) by mouth daily.     Cinnamon 500 MG Tabs  Take 1,000 mg by mouth 2 (two) times daily.     fish oil-omega-3 fatty acids 1000 MG capsule  Take 2 g by mouth daily.     gabapentin 600 MG tablet  Commonly known as:  NEURONTIN  Take 600 mg by mouth 3 (three) times daily.     insulin aspart 100 UNIT/ML injection  Commonly known as:  novoLOG  Use max 80 units per day with pump, this is for a 30 day supply     insulin pump Soln  Inject 1 each into the skin continuous. Novolog insulin     lisinopril 10 MG tablet  Commonly known as:  PRINIVIL,ZESTRIL  Take 1 tablet (10 mg total) by mouth every morning. For blood pressure     ranitidine 150 MG tablet  Commonly known as:  ZANTAC  Take 1 tablet (150 mg total) by mouth 2 (two) times daily.     venlafaxine 37.5 MG tablet  Commonly known as:  EFFEXOR  TAKE ONE TABLET BY MOUTH TWICE DAILY     VITAMIN D (CHOLECALCIFEROL) PO  Take 1 tablet by mouth daily.        Allergies:  Allergies  Allergen Reactions  . Hydrocodone-Acetaminophen Itching and Swelling  . Insulin Glargine Itching, Nausea And Vomiting and Swelling  . Nph Iletin I [Insulin Isophane, Mixed Nph] Itching, Nausea And Vomiting and Swelling    Past Medical History  Diagnosis Date  . Breast mass in female     fibrocystic changes  . Hyperlipidemia   . Nausea   . Hypertension   . Nausea & vomiting     for approx. 2 weeks  . Cataracts, bilateral   . Diabetes mellitus     insulin dependant - uses pump, Dr. Dwyane Dee  . Breast cancer     T2N2 tiple negative left breast ca    . S/P chemotherapy, time since 4-12 weeks   . History of chemotherapy     taxotere/cytoxan with  neulasta support  . Lymphedema of arm     left upper extremity  . Blood clot in vein     nonocclusive clot right subclavian vein, on Xarelto for 6 months  . Allergy   . Breast cancer   . Radiation 08/21/11-10/05/11    Left chestwll/Supraclav./left PAB/left scar    Past Surgical History  Procedure Laterality Date  . Bladder tacking  01/2009  . Shoulder surgery  04/26/10    left  . Tubal ligation  12/1999  . Left mastectomy  02/21/2011  . Portacath placement  02/21/2011    Procedure: INSERTION PORT-A-CATH;  Surgeon: Adin Hector, MD;  Location: [facility];  Service: General;  Laterality: N/A;  . Mastectomy modified radical  02/21/11    left,invasive  multifocal grade III ductal ca,high grade dcis,lymph/vascular invasion invasion (7/14) nodes positive mets  . Port-a-cath removal  10/13/2011    Procedure: MINOR REMOVAL PORT-A-CATH;  Surgeon: Adin Hector, MD;  Location: [facility];  Service: General;  Laterality: Right;  PORT-A CATH REMOVAL  . Mass excision  02/19/2012    Procedure: MINOR EXCISION OF MASS;  Surgeon: Adin Hector, MD;  Location: [facility];  Service: General;  Laterality: Left;  . Abdominal hysterectomy  2002    still has ovaries     Family History  Problem Relation Age of Onset  . Hypertension    . Diabetes    . Deep vein thrombosis    . Kidney disease    . Cancer Maternal Aunt     breast  . Cancer Maternal Uncle 50     right eye removed s/p cancer  . Cancer Maternal Grandfather     prostate ca    Social History:  reports that she has never smoked. She has never used smokeless tobacco. She reports that she does not drink alcohol or use illicit drugs.  ROS:   She has history of hyperlipidemia, currently not on medication  Lab Results  Component Value Date   CHOL 206* 05/13/2013   HDL 81.70 05/13/2013   LDLCALC 113* 05/13/2013    LDLDIRECT 92 12/15/2009   TRIG 55.0 05/13/2013   CHOLHDL 3 05/13/2013    History of mild hypertension treated with lisinopril History of depression treated with Effexor History of vitamin D deficiency  Has lymphedema on the left arm  EXAM:  BP 118/76  Pulse 83  Temp(Src) 97.9 F (36.6 C)  Resp 16  Ht 5\' 5"  (1.651 m)  Wt 207 lb (93.895 kg)  BMI 34.45 kg/m2  SpO2 98%   ASSESSMENT:  DIABETES type 1:   She still has difficulty getting blood sugars under control despite her new pump with a continuous glucose monitoring She has been able to avoid hypoglycemia most of the time However she is not taking action for high blood sugars and not calling about consistently high readings especially overnight Her blood sugars are still averaging over 200 However her A1c is slightly better than before See history of present illness for detailed assessment of her current blood sugar patterns, management and problems identified  Recommendations made today:  Start checking blood sugars more often, at least when the blood sugars being indicated is high on her continuous glucose sensor  A correction boluses every time the blood sugar is over 200 regardless of whether she is eating or not  Basal rate changes with higher basal rates late evening and overnight: Midnight = 1.9, 4 AM = 2.3 and 10 PM =  1.8  May consider reducing basal rate before lunch it starting to get low  The bolus for all meals and snacks but she does not appear to be doing  Change the setting for the high alarm on the sensor to 200  HYPERTENSION: Blood pressure is well-controlled  Hypercholesterolemia: Her calculated LDL is tending to be over 100, to check direct LDL on the next visit  Counseling time over 50% of today's 25 minute visit  Elayne Snare 05/16/2013, 10:06 AM

## 2013-05-30 ENCOUNTER — Encounter: Payer: Self-pay | Admitting: Endocrinology

## 2013-05-30 ENCOUNTER — Telehealth: Payer: Self-pay

## 2013-05-30 NOTE — Telephone Encounter (Signed)
Pt called stating she is leaving town on Tuesday and needs a letter of medical necessity for her insulin and supplies.  Please advise, Thanks!

## 2013-06-03 NOTE — Telephone Encounter (Signed)
Patient picked up letter on 06/02/2013

## 2013-07-16 ENCOUNTER — Ambulatory Visit: Payer: Commercial Managed Care - PPO | Admitting: Endocrinology

## 2013-07-17 ENCOUNTER — Telehealth: Payer: Self-pay | Admitting: *Deleted

## 2013-07-17 NOTE — Telephone Encounter (Signed)
Left message for pt to return my call so I can reschedule her appt.

## 2013-07-21 ENCOUNTER — Telehealth: Payer: Self-pay | Admitting: Family Medicine

## 2013-07-23 NOTE — Telephone Encounter (Signed)
Call Pt about DM care. When Pt calls back please schedule appointment to check LDL and A1C.  ° °

## 2013-07-25 ENCOUNTER — Other Ambulatory Visit: Payer: Commercial Managed Care - PPO

## 2013-07-25 ENCOUNTER — Ambulatory Visit: Payer: Commercial Managed Care - PPO | Admitting: Oncology

## 2013-08-05 ENCOUNTER — Other Ambulatory Visit (HOSPITAL_BASED_OUTPATIENT_CLINIC_OR_DEPARTMENT_OTHER): Payer: Medicare Other

## 2013-08-05 ENCOUNTER — Other Ambulatory Visit: Payer: Self-pay | Admitting: *Deleted

## 2013-08-05 ENCOUNTER — Ambulatory Visit (HOSPITAL_BASED_OUTPATIENT_CLINIC_OR_DEPARTMENT_OTHER): Payer: Commercial Managed Care - PPO | Admitting: Adult Health

## 2013-08-05 ENCOUNTER — Encounter: Payer: Self-pay | Admitting: Adult Health

## 2013-08-05 VITALS — BP 134/87 | HR 87 | Temp 98.4°F | Resp 18 | Ht 65.0 in | Wt 208.1 lb

## 2013-08-05 DIAGNOSIS — C50912 Malignant neoplasm of unspecified site of left female breast: Secondary | ICD-10-CM

## 2013-08-05 DIAGNOSIS — Z853 Personal history of malignant neoplasm of breast: Secondary | ICD-10-CM | POA: Diagnosis not present

## 2013-08-05 LAB — COMPREHENSIVE METABOLIC PANEL (CC13)
ALBUMIN: 3.6 g/dL (ref 3.5–5.0)
ALT: 38 U/L (ref 0–55)
ANION GAP: 8 meq/L (ref 3–11)
AST: 22 U/L (ref 5–34)
Alkaline Phosphatase: 163 U/L — ABNORMAL HIGH (ref 40–150)
BUN: 11.7 mg/dL (ref 7.0–26.0)
CALCIUM: 9.7 mg/dL (ref 8.4–10.4)
CHLORIDE: 107 meq/L (ref 98–109)
CO2: 26 meq/L (ref 22–29)
CREATININE: 0.9 mg/dL (ref 0.6–1.1)
Glucose: 143 mg/dl — ABNORMAL HIGH (ref 70–140)
POTASSIUM: 3.9 meq/L (ref 3.5–5.1)
Sodium: 142 mEq/L (ref 136–145)
Total Bilirubin: 0.59 mg/dL (ref 0.20–1.20)
Total Protein: 7.4 g/dL (ref 6.4–8.3)

## 2013-08-05 LAB — CBC WITH DIFFERENTIAL/PLATELET
BASO%: 0.9 % (ref 0.0–2.0)
Basophils Absolute: 0 10*3/uL (ref 0.0–0.1)
EOS%: 2.4 % (ref 0.0–7.0)
Eosinophils Absolute: 0.1 10*3/uL (ref 0.0–0.5)
HEMATOCRIT: 40.2 % (ref 34.8–46.6)
HGB: 13.7 g/dL (ref 11.6–15.9)
LYMPH#: 2.2 10*3/uL (ref 0.9–3.3)
LYMPH%: 50.6 % — ABNORMAL HIGH (ref 14.0–49.7)
MCH: 28.8 pg (ref 25.1–34.0)
MCHC: 34 g/dL (ref 31.5–36.0)
MCV: 84.6 fL (ref 79.5–101.0)
MONO#: 0.2 10*3/uL (ref 0.1–0.9)
MONO%: 3.7 % (ref 0.0–14.0)
NEUT#: 1.9 10*3/uL (ref 1.5–6.5)
NEUT%: 42.4 % (ref 38.4–76.8)
Platelets: 248 10*3/uL (ref 145–400)
RBC: 4.75 10*6/uL (ref 3.70–5.45)
RDW: 13 % (ref 11.2–14.5)
WBC: 4.4 10*3/uL (ref 3.9–10.3)

## 2013-08-05 MED ORDER — VENLAFAXINE HCL 37.5 MG PO TABS: ORAL_TABLET | ORAL | Status: DC

## 2013-08-05 MED ORDER — GABAPENTIN 600 MG PO TABS: 600.0000 mg | ORAL_TABLET | Freq: Three times a day (TID) | ORAL | Status: DC

## 2013-08-05 NOTE — Patient Instructions (Signed)
You are doing well. You have no sign of recurrence.  I recommend a healthy diet, exercise, monthly self breast exams.  We will see you back in 6 months.    Breast Self-Awareness Practicing breast self-awareness may pick up problems early, prevent significant medical complications, and possibly save your life. By practicing breast self-awareness, you can become familiar with how your breasts look and feel and if your breasts are changing. This allows you to notice changes early. It can also offer you some reassurance that your breast health is good. One way to learn what is normal for your breasts and whether your breasts are changing is to do a breast self-exam. If you find a lump or something that was not present in the past, it is best to contact your caregiver right away. Other findings that should be evaluated by your caregiver include nipple discharge, especially if it is bloody; skin changes or reddening; areas where the skin seems to be pulled in (retracted); or new lumps and bumps. Breast pain is seldom associated with cancer (malignancy), but should also be evaluated by a caregiver. HOW TO PERFORM A BREAST SELF-EXAM The best time to examine your breasts is 5-7 days after your menstrual period is over. During menstruation, the breasts are lumpier, and it may be more difficult to pick up changes. If you do not menstruate, have reached menopause, or had your uterus removed (hysterectomy), you should examine your breasts at regular intervals, such as monthly. If you are breastfeeding, examine your breasts after a feeding or after using a breast pump. Breast implants do not decrease the risk for lumps or tumors, so continue to perform breast self-exams as recommended. Talk to your caregiver about how to determine the difference between the implant and breast tissue. Also, talk about the amount of pressure you should use during the exam. Over time, you will become more familiar with the variations of your  breasts and more comfortable with the exam. A breast self-exam requires you to remove all your clothes above the waist. 1. Look at your breasts and nipples. Stand in front of a mirror in a room with good lighting. With your hands on your hips, push your hands firmly downward. Look for a difference in shape, contour, and size from one breast to the other (asymmetry). Asymmetry includes puckers, dips, or bumps. Also, look for skin changes, such as reddened or scaly areas on the breasts. Look for nipple changes, such as discharge, dimpling, repositioning, or redness. 2. Carefully feel your breasts. This is best done either in the shower or tub while using soapy water or when flat on your back. Place the arm (on the side of the breast you are examining) above your head. Use the pads (not the fingertips) of your three middle fingers on your opposite hand to feel your breasts. Start in the underarm area and use  inch (2 cm) overlapping circles to feel your breast. Use 3 different levels of pressure (light, medium, and firm pressure) at each circle before moving to the next circle. The light pressure is needed to feel the tissue closest to the skin. The medium pressure will help to feel breast tissue a little deeper, while the firm pressure is needed to feel the tissue close to the ribs. Continue the overlapping circles, moving downward over the breast until you feel your ribs below your breast. Then, move one finger-width towards the center of the body. Continue to use the  inch (2 cm) overlapping circles to  feel your breast as you move slowly up toward the collar bone (clavicle) near the base of the neck. Continue the up and down exam using all 3 pressures until you reach the middle of the chest. Do this with each breast, carefully feeling for lumps or changes. 3.  Keep a written record with breast changes or normal findings for each breast. By writing this information down, you do not need to depend only on memory  for size, tenderness, or location. Write down where you are in your menstrual cycle, if you are still menstruating. Breast tissue can have some lumps or thick tissue. However, see your caregiver if you find anything that concerns you.  SEEK MEDICAL CARE IF:  You see a change in shape, contour, or size of your breasts or nipples.   You see skin changes, such as reddened or scaly areas on the breasts or nipples.   You have an unusual discharge from your nipples.   You feel a new lump or unusually thick areas.  Document Released: 01/16/2005 Document Revised: 01/03/2012 Document Reviewed: 05/03/2011 [facility] Patient Information 2015 [place], [place]. This information is not intended to replace advice given to you by your health care provider. Make sure you discuss any questions you have with your health care provider.

## 2013-08-05 NOTE — Progress Notes (Signed)
OFFICE PROGRESS NOTE  CC  Terri Sat, MD [place] [address] [address] Fanny [address] Dr. Thea Silversmith  DIAGNOSIS: 43 year old female with diagnosis of triple negative invasive ductal carcinoma that was multifocal grade 3 measuring 2.4 cm. 7 of 14 lymph nodes positive diagnosed in January 2013.  PRIOR THERAPY:  #1 patient underwent a screening mammogram in December 2012. She was noted to have a mass and she underwent a diagnostic mammogram and ultrasound on December 18. She had image guided biopsy of one of the masses at the central position that revealed invasive ductal carcinoma. The invasive ductal carcinoma was also noted in the left axillary node. Both tumors were triple negative.  #2 she had MRI of bilateral breasts performed 01/25/2011 that showed 2.4 cm mass in the 12:00 position of the left breast for additional masses were noted left axillary lymph nodes were noted in level I as well mildly prominent left level II retropectoral lymph nodes. There was no evidence of malignancy in the right breast.  #3 patient elected a left mastectomy and axillary lymph node dissection on 02/21/2011. She was found to have multifocal invasive ductal carcinoma, grade 3, there were noted to be 5 foci of disease the largest measuring 2.4 cm. High-grade ductal carcinoma in situ was identified with lymphovascular invasion in the invasive disease. Margins of resection were widely negative with the closest being 1 cm 7 of 14 lymph nodes were positive.  #4 patient went on to have chemotherapy on NSABP B. 49 clinical trial. She was randomized to 6 cycles of Taxotere Cytoxan every 3 weeks with day 2 Neulasta. She completed all of her therapy in July 2013.  #5 she then went on to receive postmastectomy radiation therapy from 08/10/2011 through 10/05/2011.  #6 patient did develop right upper extremity DVT she was placed on several toe 15 mg by mouth twice a day.  #7 patient did undergo  genetic testing on 01/27/2011 which was negative for BRCA1 and BRCA2 gene mutation and no large rearrangements were detected.  CURRENT THERAPY: Observation  INTERVAL HISTORY: [name] 43 y.o. female returns for followup visit today. She is doing well today.  She continues to have left arm lymphedema that she wears a sleeve for.  She has no new pain anywhere.  She denies fevers, chills, nasuea, vomiting, constipation, diarrhea, dysuria, shortness of breath.  She has had headahces that are much improved since she has been visiting a Art therapist.  She wants to know who she can see about her increased libido.  Otherwise, a 10 point ROS is neg.  We reviewed her health maintenance below.    MEDICAL HISTORY: Past Medical History  Diagnosis Date  . Breast mass in female     fibrocystic changes  . Hyperlipidemia   . Nausea   . Hypertension   . Nausea & vomiting     for approx. 2 weeks  . Cataracts, bilateral   . Diabetes mellitus     insulin dependant - uses pump, Dr. Dwyane Dee  . Breast cancer     T2N2 tiple negative left breast ca   . S/P chemotherapy, time since 4-12 weeks   . History of chemotherapy     taxotere/cytoxan with neulasta support  . Lymphedema of arm     left upper extremity  . Blood clot in vein     nonocclusive clot right subclavian vein, on Xarelto for 6 months  . Allergy   . Breast cancer   . Radiation 08/21/11-10/05/11  Left chestwll/Supraclav./left PAB/left scar    ALLERGIES:  is allergic to hydrocodone-acetaminophen; insulin glargine; and nph iletin i.  MEDICATIONS:  Current Outpatient Prescriptions  Medication Sig Dispense Refill  . cetirizine (ZYRTEC) 10 MG tablet Take 1 tablet (10 mg total) by mouth daily.  30 tablet  11  . Chromium Picolinate 500 MCG CAPS Take 500 mcg by mouth daily.      Marland Kitchen gabapentin (NEURONTIN) 600 MG tablet Take 600 mg by mouth 3 (three) times daily.      Nyoka Cowden Coffee Bean 400 MG CAPS Take 400 mg by mouth 3 (three) times daily.       . insulin aspart (NOVOLOG) 100 UNIT/ML injection Use max 80 units per day with pump, this is for a 30 day supply  4 vial  5  . Insulin Human (INSULIN PUMP) 100 unit/ml SOLN Inject 1 each into the skin continuous. Novolog insulin      . lisinopril (PRINIVIL,ZESTRIL) 10 MG tablet Take 1 tablet (10 mg total) by mouth every morning. For blood pressure  30 tablet  5  . ranitidine (ZANTAC) 150 MG tablet Take 1 tablet (150 mg total) by mouth 2 (two) times daily.  60 tablet  2  . venlafaxine (EFFEXOR) 37.5 MG tablet TAKE ONE TABLET BY MOUTH TWICE DAILY  60 tablet  0  . acetaminophen (TYLENOL) 500 MG tablet Take 1 tablet (500 mg total) by mouth every 6 (six) hours as needed for pain.  30 tablet  0  . VITAMIN D, CHOLECALCIFEROL, PO Take 1 tablet by mouth daily.        No current facility-administered medications for this visit.    SURGICAL HISTORY:  Past Surgical History  Procedure Laterality Date  . Bladder tacking  01/2009  . Shoulder surgery  04/26/10    left  . Tubal ligation  12/1999  . Left mastectomy  02/21/2011  . Portacath placement  02/21/2011    Procedure: INSERTION PORT-A-CATH;  Surgeon: Adin Hector, MD;  Location: [facility];  Service: General;  Laterality: N/A;  . Mastectomy modified radical  02/21/11    left,invasive  multifocal grade III ductal ca,high grade dcis,lymph/vascular invasion invasion (7/14) nodes positive mets  . Port-a-cath removal  10/13/2011    Procedure: MINOR REMOVAL PORT-A-CATH;  Surgeon: Adin Hector, MD;  Location: [facility];  Service: General;  Laterality: Right;  PORT-A CATH REMOVAL  . Mass excision  02/19/2012    Procedure: MINOR EXCISION OF MASS;  Surgeon: Adin Hector, MD;  Location: [facility];  Service: General;  Laterality: Left;  . Abdominal hysterectomy  2002    still has ovaries     REVIEW OF SYSTEMS:  A 10 point review of systems was conducted and is otherwise negative except for what is noted above.    Health  Maintenance  Mammogram: 12/31/2012 Colonoscopy: n/a  Bone Density Scan: n/a Pap Smear: 08/12/12 Eye Exam: due Lipid Panel: 04/2013  PHYSICAL EXAMINATION: Blood pressure 134/87, pulse 87, temperature 98.4 F (36.9 C), temperature source Oral, resp. rate 18, height 5' 5" (1.651 m), weight 208 lb 1.6 oz (94.394 kg). Body mass index is 34.63 kg/(m^2). GENERAL: Patient is a well appearing female in no acute distress HEENT:  Sclerae anicteric.  Oropharynx clear and moist. No ulcerations or evidence of oropharyngeal candidiasis. Neck is supple.  NODES:  No cervical, supraclavicular, or axillary lymphadenopathy palpated.  BREAST EXAM:  Right breast without nodules, masses or skin changes, left mastectomy site without nodularity,  change, or sign of recurrence.   LUNGS:  Clear to auscultation bilaterally.  No wheezes or rhonchi. HEART:  Regular rate and rhythm. No murmur appreciated. ABDOMEN:  Soft, nontender.  Positive, normoactive bowel sounds. No organomegaly palpated. MSK:  No focal spinal tenderness to palpation. Full range of motion bilaterally in the upper extremities. EXTREMITIES:  No peripheral edema.   SKIN:  Clear with no obvious rashes or skin changes. No nail dyscrasia. NEURO:  Nonfocal. Well oriented.  Appropriate affect. ECOG PERFORMANCE STATUS: 0 - Asymptomatic  LABORATORY DATA: Lab Results  Component Value Date   WBC 4.4 08/05/2013   HGB 13.7 08/05/2013   HCT 40.2 08/05/2013   MCV 84.6 08/05/2013   PLT 248 08/05/2013      Chemistry      Component Value Date/Time   NA 142 08/05/2013 1324   NA 139 05/13/2013 1035   K 3.9 08/05/2013 1324   K 4.2 05/13/2013 1035   CL 105 05/13/2013 1035   CL 107 07/15/2012 1021   CO2 26 08/05/2013 1324   CO2 28 05/13/2013 1035   BUN 11.7 08/05/2013 1324   BUN 18 05/13/2013 1035   CREATININE 0.9 08/05/2013 1324   CREATININE 0.8 05/13/2013 1035      Component Value Date/Time   CALCIUM 9.7 08/05/2013 1324   CALCIUM 9.6 05/13/2013 1035   ALKPHOS 163* 08/05/2013  1324   ALKPHOS 137* 05/13/2013 1035   AST 22 08/05/2013 1324   AST 32 05/13/2013 1035   ALT 38 08/05/2013 1324   ALT 33 05/13/2013 1035   BILITOT 0.59 08/05/2013 1324   BILITOT 0.5 05/13/2013 1035     ADDITIONAL INFORMATION: 1. PROGNOSTIC INDICATORS - ACIS Results IMMUNOHISTOCHEMICAL AND MORPHOMETRIC ANALYSIS BY THE AUTOMATED CELLULAR IMAGING SYSTEM (ACIS) Estrogen Receptor (Negative, <1%): 0%, NEGATIVE Progesterone Receptor (Negative, <1%): 0%, NEGATIVE COMMENT: The negative hormone receptor study(ies) in this case have an internal positive control. All controls stained appropriately Enid Cutter MD Pathologist, Electronic Signature ( Signed 03/01/2011) 1. CHROMOGENIC IN-SITU HYBRIDIZATION Interpretation HER-2/NEU BY CISH - NO AMPLIFICATION OF HER-2 DETECTED. THE RATIO OF HER-2: CEP 17 SIGNALS WAS 1.41. Reference range: Ratio: HER2:CEP17 < 1.8 - gene amplification not observed Ratio: HER2:CEP 17 1.8-2.2 - equivocal result Ratio: HER2:CEP17 > 2.2 - gene amplification observed Enid Cutter MD Pathologist, Electronic Signature ( Signed 03/01/2011) 1 of 4 FINAL for MAXIMINA, PIROZZI ([id number]) FINAL DIAGNOSIS Diagnosis 1. Breast, modified radical mastectomy , left - INVASIVE MULTIFOCAL GRADE III DUCTAL CARCINOMA, LARGEST FOCUS SPANS 2.4 CM. - FOUR OTHER FOCI PRESENT MEASURING 1.2, 1.0, 1.0 AND 0.2 CM - HIGH GRADE DUCTAL CARCINOMA IN SITU IDENTIFIED. - LYMPH/VASCULAR INVASION IS IDENTIFIED. - MARGINS OF MASTECTOMY SPECIMEN ARE NEGATIVE. - SEVEN OF FOURTEEN LYMPH NODES POSITIVE FOR METASTATIC CARCINOMA. - NODULE OF GRADE III DUCTAL CARCINOMA ASSOCIATED WITH ECTOPIC BREAST TISSUE PRESENT IN AXILLARY CONTENTS, MEASURING 0.5 CM. - SEE ONCOLOGY TEMPLATE. 2. Lymph node, biopsy, left axillary, highest level 2 - ONE BENIGN LYMPH NODE WITH NO TUMOR SEEN (0/1). Microscopic Comment 1. BREAST, INVASIVE TUMOR, WITH LYMPH NODE SAMPLING Specimen, including laterality: Left modified  radical mastectomy. Procedure: Left modified radial mastectomy. Grade: III (all areas of carcinoma are morphologically similar) Tubule formation: 3 Nuclear pleomorphism: 2 Mitotic: 3 Tumor size (gross measurement and/or glass slide measurement): 2.4 cm, largest focus; 1.2, 1.0, 1.0, and 0.2 cm additional foci. Margins: Invasive, distance to closest margin: 1 cm. In-situ, distance to closest margin: At least 1 cm. Lymphovascular invasion: Present. Ductal carcinoma in situ: Present. Grade:  High grade. Extensive intraductal component: No. Lobular neoplasia: No. Tumor focality: Multifocal Treatment effect: N/A Extent of tumor: Tumor present in breast parenchyma; A second focus is present in ectopic breast tissue present in axillary contents. Lymph nodes: # examined: 16. Lymph nodes with metastasis: 7. Macrometastasis: (> 2.0 mm): 7. Extracapsular extension: Not identified. Breast prognostic profile: 1 o'clock lesion, ([id number]): Estrogen receptor: 0%, negative. Progesterone receptor: 0%, negative. Her 2 neu: 1.36, no amplification. Ki-67: 99%. 12 o'clock (largest lesion), ([id number]): Estrogen receptor: 0%, negative. Progesterone receptor: 0%, negative. Her 2 neu: 1.25, no amplification. Ki-67: 78%. 2 of 4 FINAL for ARIANI, SEIER ([id number]) Microscopic Comment(continued) Non-neoplastic breast: Usual ductal hyperplasia present. TNM: mpT2, pN2a, MX. Comments: Immunohistochemical staining is performed on three blocks (1Q, 1S, and 1R) for cytokeratin AE1/AE3 for quantification of the metastatic tumor deposits. All of the tumor foci are morphologically identical and as such, ER, PR and Her-2/neu by CISH will be repeated on the largest tumor focus and reported in an addendum. Additional breast prognostic profiles can be performed on the additional tumor nodules upon request. (RAH:gt, 02/23/11) ROBERT HILLARD  RADIOGRAPHIC STUDIES:  Dg  Orthopantogram  06/27/2012   *RADIOLOGY REPORT*  Clinical Data: Jaw pain.  Breast cancer.  ORTHOPANTOGRAM/PANORAMIC  Comparison: 02/13/2011 PET CT  Findings: 0.9 x 0.5 cm lucent lesion along the right mandibular condyle is not readily apparent on the prior PET CT.  Minimal otherwise unremarkable.  IMPRESSION:  1. Sub centimeter lucency in the right mandibular condyle.  This could well represent an erosion or benign lesion.  Lytic osseous metastatic disease cannot be completely excluded - consider whole- body bone scan.   Original Report Authenticated By: Van Clines, M.D.   Nm Bone Scan Whole Body  07/04/2012   *RADIOLOGY REPORT*  Clinical Data: Breast cancer.  Jaw pain.  NUCLEAR MEDICINE WHOLE BODY BONE SCINTIGRAPHY  Technique:  Whole body anterior and posterior images were obtained approximately 3 hours after intravenous injection of radiopharmaceutical.  Radiopharmaceutical: 25MILLI CURIE TC-MDP TECHNETIUM TC 7M MEDRONATE IV KIT  Comparison: Orthopantogram dated 06/27/2012.  PET CT scan dated 02/13/2011  Findings: There is no evidence of metastatic disease to the skeleton.  Specifically, the mandible appears normal including the region of the temporomandibular joints bilaterally.  The patient does have a slight thoracolumbar scoliosis.  IMPRESSION: Essentially normal bone scan.  No evidence of metastatic disease. Specifically, the mandible appears normal.   Original Report Authenticated By: Lorriane Shire, M.D.   US Breast Right  07/11/2012   *RADIOLOGY REPORT*  Clinical Data:  History of left mastectomy.  Short-term reevaluation right breast for probably benign asymmetry.  DIGITAL DIAGNOSTIC RIGHT MAMMOGRAM WITH CAD AND RIGHT BREAST ULTRASOUND:  Comparison:  01/10/2012, 12/29/2011, 01/26/2011, 01/18/2011, 12/27/2010, 12/13/2009  Findings:  ACR Breast Density Category 2: There is a scattered fibroglandular pattern.  The previously perceived asymmetry in the medial right breast has become less  apparent.  It again appears most consistent with overlapping normal glandular tissue.  Mammographic images were processed with CAD.  On physical exam, there are no palpable abnormalities.  Ultrasound is performed, showing no abnormalities in the medial right breast.  IMPRESSION: Overlapping fibroglandular tissue again appreciated in the medial right breast.  It appears less conspicuous than it did on the prior study.  BI-RADS CATEGORY 3:  Probably benign finding(s) - short interval follow-up suggested.  RECOMMENDATION: Diagnostic right mammogram in 6 months.  I have discussed the findings and recommendations with the patient. Results were also provided in writing at the  conclusion of the visit.  If applicable, a reminder letter will be sent to the patient regarding the next appointment.   Original Report Authenticated By: Skipper Cliche, M.D.   Mm Digital Diagnostic Unilat R  07/11/2012   *RADIOLOGY REPORT*  Clinical Data:  History of left mastectomy.  Short-term reevaluation right breast for probably benign asymmetry.  DIGITAL DIAGNOSTIC RIGHT MAMMOGRAM WITH CAD AND RIGHT BREAST ULTRASOUND:  Comparison:  01/10/2012, 12/29/2011, 01/26/2011, 01/18/2011, 12/27/2010, 12/13/2009  Findings:  ACR Breast Density Category 2: There is a scattered fibroglandular pattern.  The previously perceived asymmetry in the medial right breast has become less apparent.  It again appears most consistent with overlapping normal glandular tissue.  Mammographic images were processed with CAD.  On physical exam, there are no palpable abnormalities.  Ultrasound is performed, showing no abnormalities in the medial right breast.  IMPRESSION: Overlapping fibroglandular tissue again appreciated in the medial right breast.  It appears less conspicuous than it did on the prior study.  BI-RADS CATEGORY 3:  Probably benign finding(s) - short interval follow-up suggested.  RECOMMENDATION: Diagnostic right mammogram in 6 months.  I have discussed  the findings and recommendations with the patient. Results were also provided in writing at the conclusion of the visit.  If applicable, a reminder letter will be sent to the patient regarding the next appointment.   Original Report Authenticated By: Skipper Cliche, M.D.    ASSESSMENT: 43 year old female with  #1 history of stage II (T2 N2 MX) invasive ductal carcinoma of the left breast patient is status post mastectomy for multifocal disease and no positive disease largest tumor measured 2.4 cm. Tumor was ER negative PR negative HER-2/neu negative with a Ki-67 of 78%.  #2 postoperatively patient underwent adjuvant chemotherapy on NSABP B. 49 clinical trial. She received Taxotere Cytoxan every 3 weeks for a total of 6 cycles. She completed this in July 2013.  #3 patient then went on to receive adjuvant radiation therapy completing in September 2013. She had been thereafter seeing Dr. Eston Esters every 3-6 months time.  #4 patient recently had a mammogram performed that was suspicious and therefore she did undergo biopsies in that was negative. She is recommended to have a six-month followup mammogram.   PLAN:  Athleen is doing well today.  She has no sign of recurrence.  Her labs are stable I reviewed them with her in detail.  We discussed survivorship in detail.  I recommended a healthy diet, exercise, and monthly self breast exams.  She was recommended to undergo a mammogram of her right breast when its due.  I did refer her to [facility] for her increased libido.    Aissa will return in 6 months for labs and evaluation.    All questions were answered. The patient knows to call the clinic with any problems, questions or concerns. We can certainly see the patient much sooner if necessary.  I spent 25 minutes counseling the patient face to face. The total time spent in the appointment was 30 minutes.  Minette Headland, [place] [phone] 08/05/2013,  2:26 PM

## 2013-08-06 ENCOUNTER — Telehealth: Payer: Self-pay | Admitting: Hematology and Oncology

## 2013-08-06 NOTE — Telephone Encounter (Signed)
Lft msg for pt of apt set up for 01/29/14 labs and 02/05/14 ov per 08/05/13 POF, mailed out ltr to pt...KJ

## 2013-08-06 NOTE — Telephone Encounter (Signed)
Addendum: Per 08/05/13 POF, pt needs referral to [facility], msg to NP/LC for referral ..Marland KitchenKJ

## 2013-08-07 NOTE — Addendum Note (Signed)
Addended by: Minette Headland on: 08/07/2013 11:17 AM   Modules accepted: Orders

## 2013-08-12 ENCOUNTER — Ambulatory Visit: Payer: Commercial Managed Care - PPO | Attending: Oncology | Admitting: Physical Therapy

## 2013-08-12 DIAGNOSIS — Z9010 Acquired absence of unspecified breast and nipple: Secondary | ICD-10-CM | POA: Insufficient documentation

## 2013-08-12 DIAGNOSIS — M25519 Pain in unspecified shoulder: Secondary | ICD-10-CM | POA: Diagnosis not present

## 2013-08-12 DIAGNOSIS — H2510 Age-related nuclear cataract, unspecified eye: Secondary | ICD-10-CM | POA: Diagnosis not present

## 2013-08-12 DIAGNOSIS — E11329 Type 2 diabetes mellitus with mild nonproliferative diabetic retinopathy without macular edema: Secondary | ICD-10-CM | POA: Diagnosis not present

## 2013-08-12 DIAGNOSIS — Z9221 Personal history of antineoplastic chemotherapy: Secondary | ICD-10-CM | POA: Insufficient documentation

## 2013-08-12 DIAGNOSIS — M24519 Contracture, unspecified shoulder: Secondary | ICD-10-CM | POA: Diagnosis not present

## 2013-08-12 DIAGNOSIS — E1139 Type 2 diabetes mellitus with other diabetic ophthalmic complication: Secondary | ICD-10-CM | POA: Diagnosis not present

## 2013-08-12 DIAGNOSIS — I890 Lymphedema, not elsewhere classified: Secondary | ICD-10-CM | POA: Diagnosis not present

## 2013-08-12 DIAGNOSIS — Z923 Personal history of irradiation: Secondary | ICD-10-CM | POA: Insufficient documentation

## 2013-08-12 DIAGNOSIS — C50919 Malignant neoplasm of unspecified site of unspecified female breast: Secondary | ICD-10-CM | POA: Diagnosis not present

## 2013-08-12 DIAGNOSIS — Z86718 Personal history of other venous thrombosis and embolism: Secondary | ICD-10-CM | POA: Diagnosis not present

## 2013-08-12 DIAGNOSIS — H35349 Macular cyst, hole, or pseudohole, unspecified eye: Secondary | ICD-10-CM | POA: Diagnosis not present

## 2013-08-12 DIAGNOSIS — IMO0001 Reserved for inherently not codable concepts without codable children: Secondary | ICD-10-CM | POA: Diagnosis not present

## 2013-08-13 ENCOUNTER — Ambulatory Visit: Payer: Commercial Managed Care - PPO | Admitting: Physical Therapy

## 2013-08-13 ENCOUNTER — Other Ambulatory Visit: Payer: Self-pay | Admitting: Adult Health

## 2013-08-13 DIAGNOSIS — M25519 Pain in unspecified shoulder: Secondary | ICD-10-CM | POA: Diagnosis not present

## 2013-08-13 DIAGNOSIS — Z9010 Acquired absence of unspecified breast and nipple: Secondary | ICD-10-CM | POA: Diagnosis not present

## 2013-08-13 DIAGNOSIS — I890 Lymphedema, not elsewhere classified: Secondary | ICD-10-CM | POA: Diagnosis not present

## 2013-08-13 DIAGNOSIS — M24519 Contracture, unspecified shoulder: Secondary | ICD-10-CM | POA: Diagnosis not present

## 2013-08-13 DIAGNOSIS — IMO0001 Reserved for inherently not codable concepts without codable children: Secondary | ICD-10-CM | POA: Diagnosis not present

## 2013-08-13 DIAGNOSIS — C50919 Malignant neoplasm of unspecified site of unspecified female breast: Secondary | ICD-10-CM | POA: Diagnosis not present

## 2013-08-14 ENCOUNTER — Ambulatory Visit: Payer: Commercial Managed Care - PPO | Admitting: Physical Therapy

## 2013-08-14 ENCOUNTER — Telehealth: Payer: Self-pay

## 2013-08-14 DIAGNOSIS — M25519 Pain in unspecified shoulder: Secondary | ICD-10-CM | POA: Diagnosis not present

## 2013-08-14 DIAGNOSIS — IMO0001 Reserved for inherently not codable concepts without codable children: Secondary | ICD-10-CM | POA: Diagnosis not present

## 2013-08-14 DIAGNOSIS — M24519 Contracture, unspecified shoulder: Secondary | ICD-10-CM | POA: Diagnosis not present

## 2013-08-14 DIAGNOSIS — C50919 Malignant neoplasm of unspecified site of unspecified female breast: Secondary | ICD-10-CM | POA: Diagnosis not present

## 2013-08-14 DIAGNOSIS — I890 Lymphedema, not elsewhere classified: Secondary | ICD-10-CM | POA: Diagnosis not present

## 2013-08-14 DIAGNOSIS — Z9010 Acquired absence of unspecified breast and nipple: Secondary | ICD-10-CM | POA: Diagnosis not present

## 2013-08-14 NOTE — Telephone Encounter (Signed)
Faxed approved assessment and plan for treatment to [place] [facility].  Sent to scan.

## 2013-08-20 ENCOUNTER — Ambulatory Visit: Payer: Commercial Managed Care - PPO

## 2013-08-20 DIAGNOSIS — IMO0001 Reserved for inherently not codable concepts without codable children: Secondary | ICD-10-CM | POA: Diagnosis not present

## 2013-08-20 DIAGNOSIS — M24519 Contracture, unspecified shoulder: Secondary | ICD-10-CM | POA: Diagnosis not present

## 2013-08-20 DIAGNOSIS — C50919 Malignant neoplasm of unspecified site of unspecified female breast: Secondary | ICD-10-CM | POA: Diagnosis not present

## 2013-08-20 DIAGNOSIS — I890 Lymphedema, not elsewhere classified: Secondary | ICD-10-CM | POA: Diagnosis not present

## 2013-08-20 DIAGNOSIS — M25519 Pain in unspecified shoulder: Secondary | ICD-10-CM | POA: Diagnosis not present

## 2013-08-20 DIAGNOSIS — Z9010 Acquired absence of unspecified breast and nipple: Secondary | ICD-10-CM | POA: Diagnosis not present

## 2013-08-27 DIAGNOSIS — E1139 Type 2 diabetes mellitus with other diabetic ophthalmic complication: Secondary | ICD-10-CM | POA: Diagnosis not present

## 2013-08-27 DIAGNOSIS — E11311 Type 2 diabetes mellitus with unspecified diabetic retinopathy with macular edema: Secondary | ICD-10-CM | POA: Diagnosis not present

## 2013-08-27 DIAGNOSIS — E11329 Type 2 diabetes mellitus with mild nonproliferative diabetic retinopathy without macular edema: Secondary | ICD-10-CM | POA: Diagnosis not present

## 2013-08-27 DIAGNOSIS — H2510 Age-related nuclear cataract, unspecified eye: Secondary | ICD-10-CM | POA: Diagnosis not present

## 2013-09-01 ENCOUNTER — Ambulatory Visit (INDEPENDENT_AMBULATORY_CARE_PROVIDER_SITE_OTHER): Payer: Commercial Managed Care - PPO | Admitting: Endocrinology

## 2013-09-01 VITALS — BP 120/88 | HR 81 | Temp 98.3°F | Resp 16 | Ht 65.0 in | Wt 206.6 lb

## 2013-09-01 DIAGNOSIS — IMO0002 Reserved for concepts with insufficient information to code with codable children: Secondary | ICD-10-CM | POA: Diagnosis not present

## 2013-09-01 DIAGNOSIS — E785 Hyperlipidemia, unspecified: Secondary | ICD-10-CM | POA: Diagnosis not present

## 2013-09-01 DIAGNOSIS — E1065 Type 1 diabetes mellitus with hyperglycemia: Secondary | ICD-10-CM

## 2013-09-01 DIAGNOSIS — I10 Essential (primary) hypertension: Secondary | ICD-10-CM

## 2013-09-01 LAB — COMPREHENSIVE METABOLIC PANEL
ALK PHOS: 117 U/L (ref 39–117)
ALT: 18 U/L (ref 0–35)
AST: 18 U/L (ref 0–37)
Albumin: 3.7 g/dL (ref 3.5–5.2)
BILIRUBIN TOTAL: 0.7 mg/dL (ref 0.2–1.2)
BUN: 11 mg/dL (ref 6–23)
CO2: 29 meq/L (ref 19–32)
CREATININE: 0.8 mg/dL (ref 0.4–1.2)
Calcium: 9.4 mg/dL (ref 8.4–10.5)
Chloride: 101 mEq/L (ref 96–112)
GFR: 97.01 mL/min (ref >60.00)
GLUCOSE: 125 mg/dL — AB (ref 70–99)
Potassium: 3.5 mEq/L (ref 3.5–5.1)
SODIUM: 136 meq/L (ref 135–145)
Total Protein: 7.5 g/dL (ref 6.0–8.3)

## 2013-09-01 LAB — HEMOGLOBIN A1C: HEMOGLOBIN A1C: 8.4 % — AB (ref 4.6–6.5)

## 2013-09-01 NOTE — Progress Notes (Signed)
Patient ID: [name], female   DOB: 09-12-1970, 43 y.o.   MRN: [id number]   Reason for Appointment: Insulin Pump followup:   History of Present Illness   Diagnosis: Type 1 DIABETES MELITUS, date of diagnosis: 2002    Previous history: She generally has had poor control and A1c in the past has been consistently over 8% despite using insulin pump since 2005. This is partly related to her lack of motivation to check blood sugars consistently and also because of variability in her blood sugars and periods of hyperglycemia and hypoglycemia. Also not consistently bolusing for meals and high sugars   CURRENT insulin pump: One Touch Ping   PUMP SETTINGS are: Midnight = 1.9, 4 AM = 2.3 and  6 AM = 2.4,. 9 AM = 2.3,. 12 noon = 2.10 , 5 PM = 2.8 and 10 PM = 1.8  Carbohydrate coverage 1:15 and sensitivity 1:30 with target 120 Pump management: She is getting a total of about 58 units insulin daily with average nasal: Bolus ratio  85:15  RECENT history:  Her blood sugars are still quite erratic with periods of hyperglycemia and hypoglycemia Since he is not entering her blood sugars consistently in her pump is difficult to get a good idea of her blood sugar pattern except on her CGM for the last week Again her blood sugars are overall high Again she is having the following problems with control as follows:  Blood sugars are markedly variable overnight with mostly high  Not clear if some of her high readings overnight are related to evening meals but she is generally eating erratically  She still seems to be eating only one meal a day and not clear but she has no boluses on some days at all. She thinks that if she eats something which is relatively low carbohydrate she will not bolus even though it is high fat  She is not doing enough correction boluses when the blood sugars are showing high on the continuous glucose monitoring especially at night  She has had more hypoglycemia in the mornings,  generally between 7 AM-10 AM   BOLUSES are only 0.8 times a day and on some days none at all.  HYPOGLYCEMIC episodes: As above, mostly in the morning hours and transiently early afternoon and 7-10 PM She usually has symptoms of shakiness but not always symptomatic, may be able to tell her blood sugar is below 50-60  GLUCOSE readings: Checking 1.3 times a day  PREMEAL Breakfast Lunch  3-6 PM  7 PM-1AM  Overall  Glucose range:  37-145   57-201   113-352   48-457    Mean/median:      149     CGM RECORD INTERPRETATION    Dates of Recording: 05/11/13-05/16/13          Sensor  summary:  Quality of the data is good with adequate sensor function. Glucose values were 228 per day with overall average 185+/-100 with a range 39-401.      Glycemic patterns:   blood sugars are mostly high overnight and tending to fall in the early morning; subsequently blood sugars are generally fluctuating between 60-220 but had much higher readings on 2 of today's in the afternoons       Overnight periods:   her blood sugars are mostly  high starting around midnight until about 6 AM but on other days they are steadily declining and falling into the low range by 6 AM. Has only low sugars  on early Sunday morning      Preprandial periods:   fasting blood sugars are averaging about 160 between about 6 AM-10 AM. She is not eating any consistent meals and difficult to assess preprandial blood sugars Blood sugars around midday are 1 averaging 142, at 6-8 PM averaging 239      Postprandial periods:   not able to establish a pattern since she is bolusing for meals sporadically. After her evening meal on 8/1 her blood sugar was somewhat better compared to her previous reading of about 180 and same day in the morning blood sugar was relatively lower after breakfast       Hypoglycemia:  has had significantly low blood sugars early-morning and also around 2 PM along with transient low sugars in the evenings       EXERCISE:   Minimal  LABS:  Lab Results  Component Value Date   HGBA1C 8.4* 09/01/2013   HGBA1C 8.8* 05/13/2013   HGBA1C 9.9* 02/04/2013   Lab Results  Component Value Date   MICROALBUR 5.5* 05/13/2013   LDLCALC 113* 05/13/2013   CREATININE 0.8 09/01/2013          Medication List       This list is accurate as of: 09/01/13 11:59 PM.  Always use your most recent med list.               acetaminophen 500 MG tablet  Commonly known as:  TYLENOL  Take 1 tablet (500 mg total) by mouth every 6 (six) hours as needed for pain.     cetirizine 10 MG tablet  Commonly known as:  ZYRTEC  Take 1 tablet (10 mg total) by mouth daily.     Chromium Picolinate 500 MCG Caps  Take 500 mcg by mouth daily.     gabapentin 600 MG tablet  Commonly known as:  NEURONTIN  Take 1 tablet (600 mg total) by mouth 3 (three) times daily.     Green Coffee Bean 400 MG Caps  Take 400 mg by mouth 3 (three) times daily.     insulin aspart 100 UNIT/ML injection  Commonly known as:  novoLOG  Use max 80 units per day with pump, this is for a 30 day supply     insulin pump Soln  Inject 1 each into the skin continuous. Novolog insulin     lisinopril 10 MG tablet  Commonly known as:  PRINIVIL,ZESTRIL  Take 1 tablet (10 mg total) by mouth every morning. For blood pressure     ranitidine 150 MG tablet  Commonly known as:  ZANTAC  Take 1 tablet (150 mg total) by mouth 2 (two) times daily.     venlafaxine 37.5 MG tablet  Commonly known as:  EFFEXOR  TAKE ONE TABLET BY MOUTH TWICE DAILY     VITAMIN D (CHOLECALCIFEROL) PO  Take 1 tablet by mouth daily.        Allergies:  Allergies  Allergen Reactions  . Hydrocodone-Acetaminophen Itching and Swelling  . Insulin Glargine Itching, Nausea And Vomiting and Swelling  . Nph Iletin I [Insulin Isophane, Mixed Nph] Itching, Nausea And Vomiting and Swelling    Past Medical History  Diagnosis Date  . Breast mass in female     fibrocystic changes  . Hyperlipidemia   .  Nausea   . Hypertension   . Nausea & vomiting     for approx. 2 weeks  . Cataracts, bilateral   . Diabetes mellitus     insulin dependant -  uses pump, Dr. Dwyane Dee  . Breast cancer     T2N2 tiple negative left breast ca   . S/P chemotherapy, time since 4-12 weeks   . History of chemotherapy     taxotere/cytoxan with neulasta support  . Lymphedema of arm     left upper extremity  . Blood clot in vein     nonocclusive clot right subclavian vein, on Xarelto for 6 months  . Allergy   . Breast cancer   . Radiation 08/21/11-10/05/11    Left chestwll/Supraclav./left PAB/left scar    Past Surgical History  Procedure Laterality Date  . Bladder tacking  01/2009  . Shoulder surgery  04/26/10    left  . Tubal ligation  12/1999  . Left mastectomy  02/21/2011  . Portacath placement  02/21/2011    Procedure: INSERTION PORT-A-CATH;  Surgeon: Adin Hector, MD;  Location: [facility];  Service: General;  Laterality: N/A;  . Mastectomy modified radical  02/21/11    left,invasive  multifocal grade III ductal ca,high grade dcis,lymph/vascular invasion invasion (7/14) nodes positive mets  . Port-a-cath removal  10/13/2011    Procedure: MINOR REMOVAL PORT-A-CATH;  Surgeon: Adin Hector, MD;  Location: [facility];  Service: General;  Laterality: Right;  PORT-A CATH REMOVAL  . Mass excision  02/19/2012    Procedure: MINOR EXCISION OF MASS;  Surgeon: Adin Hector, MD;  Location: [facility];  Service: General;  Laterality: Left;  . Abdominal hysterectomy  2002    still has ovaries     Family History  Problem Relation Age of Onset  . Hypertension    . Diabetes    . Deep vein thrombosis    . Kidney disease    . Cancer Maternal Aunt     breast  . Cancer Maternal Uncle 50     right eye removed s/p cancer  . Cancer Maternal Grandfather     prostate ca    Social History:  reports that she has never smoked. She has never used smokeless tobacco. She reports that she does  not drink alcohol or use illicit drugs.  ROS:   She has history of hyperlipidemia, currently not on medication  Lab Results  Component Value Date   CHOL 206* 05/13/2013   HDL 81.70 05/13/2013   LDLCALC 113* 05/13/2013   LDLDIRECT 92 12/15/2009   TRIG 55.0 05/13/2013   CHOLHDL 3 05/13/2013    History of mild hypertension treated with lisinopril but she is taking this irregularly History of depression treated with Effexor History of vitamin D deficiency  Has lymphedema on the left arm  EXAM:  BP 120/88  Pulse 81  Temp(Src) 98.3 F (36.8 C)  Resp 16  Ht 5\' 5"  (1.651 m)  Wt 206 lb 9.6 oz (93.713 kg)  BMI 34.38 kg/m2  SpO2 97%   ASSESSMENT:  DIABETES type 1:   She still has difficulty getting blood sugars under control despite her new pump with a continuous glucose monitoring A1c needs to be checked today Overall she is tending to be hyperglycemic throughout the night and periodically in the afternoons However she has a tendency to low blood sugars early-morning and afternoon/early evening She has only a few meals entered in her pump and not clear what her postprandial patterns are but may be getting excessive boluses especially in the morning See history of present illness for detailed assessment of her current blood sugar patterns, management and problems identified  Recommendations made today:  Start  entering blood sugars in her pump consistently at least before the next visit  Basal rates will be changed as  below to improve her blood sugar patterns detailed above Midnight = 2.1, 4 AM = 2.2,. 9 AM = 2.3,. 12 noon = 2.0 , 4 PM = 2.8 and 10 PM = 1.8  Needs to do correction boluses every time the blood sugar is over 150 on her continuous glucose sensor regardless of whether she is eating or not; she can at least bolus for readings over 250 during the night  Take boluses for all meals and snacks including some coverage for high fat meals or snacks that may be otherwise  low in carbohydrate  Call if still having frequent hypoglycemia  HYPERTENSION: Blood pressure is not well controlled and she needs to take her medication daily. Consider increasing the dose if needed  Hypercholesterolemia: Needs followup levels  Counseling time over 50% of today's 25 minute visit  Samit Sylve 09/03/2013, 3:19 PM

## 2013-09-01 NOTE — Patient Instructions (Signed)
Take Bp Pill daily  Must enter sugars in pump 3x daily for 2 weeks prior to visit  Bolus at any time sugar is over 150  Extra 2-3 units for high fat meals and combo bolus for 2 hours

## 2013-09-02 ENCOUNTER — Ambulatory Visit: Payer: Commercial Managed Care - PPO | Attending: Oncology | Admitting: Physical Therapy

## 2013-09-02 DIAGNOSIS — IMO0001 Reserved for inherently not codable concepts without codable children: Secondary | ICD-10-CM | POA: Insufficient documentation

## 2013-09-02 DIAGNOSIS — R0609 Other forms of dyspnea: Secondary | ICD-10-CM | POA: Diagnosis not present

## 2013-09-02 DIAGNOSIS — Z4889 Encounter for other specified surgical aftercare: Secondary | ICD-10-CM | POA: Insufficient documentation

## 2013-09-02 DIAGNOSIS — Z86718 Personal history of other venous thrombosis and embolism: Secondary | ICD-10-CM | POA: Diagnosis not present

## 2013-09-02 DIAGNOSIS — Z9221 Personal history of antineoplastic chemotherapy: Secondary | ICD-10-CM | POA: Insufficient documentation

## 2013-09-02 DIAGNOSIS — C50919 Malignant neoplasm of unspecified site of unspecified female breast: Secondary | ICD-10-CM | POA: Diagnosis not present

## 2013-09-02 DIAGNOSIS — M24519 Contracture, unspecified shoulder: Secondary | ICD-10-CM | POA: Diagnosis not present

## 2013-09-02 DIAGNOSIS — Z923 Personal history of irradiation: Secondary | ICD-10-CM | POA: Diagnosis not present

## 2013-09-02 DIAGNOSIS — I890 Lymphedema, not elsewhere classified: Secondary | ICD-10-CM | POA: Insufficient documentation

## 2013-09-02 DIAGNOSIS — M25519 Pain in unspecified shoulder: Secondary | ICD-10-CM | POA: Diagnosis not present

## 2013-09-02 DIAGNOSIS — R0989 Other specified symptoms and signs involving the circulatory and respiratory systems: Secondary | ICD-10-CM | POA: Diagnosis not present

## 2013-09-03 ENCOUNTER — Ambulatory Visit: Payer: Commercial Managed Care - PPO | Admitting: Physical Therapy

## 2013-09-03 DIAGNOSIS — IMO0001 Reserved for inherently not codable concepts without codable children: Secondary | ICD-10-CM | POA: Diagnosis not present

## 2013-09-08 ENCOUNTER — Ambulatory Visit: Payer: Commercial Managed Care - PPO | Admitting: Physical Therapy

## 2013-09-08 DIAGNOSIS — IMO0001 Reserved for inherently not codable concepts without codable children: Secondary | ICD-10-CM | POA: Diagnosis not present

## 2013-09-10 ENCOUNTER — Ambulatory Visit: Payer: Commercial Managed Care - PPO | Admitting: Physical Therapy

## 2013-09-10 DIAGNOSIS — IMO0001 Reserved for inherently not codable concepts without codable children: Secondary | ICD-10-CM | POA: Diagnosis not present

## 2013-09-11 ENCOUNTER — Telehealth: Payer: Self-pay

## 2013-09-11 NOTE — Telephone Encounter (Signed)
Faxed order for PT compression sleeve to [facility].  Sent to scan.

## 2013-09-12 ENCOUNTER — Ambulatory Visit: Payer: Commercial Managed Care - PPO | Admitting: Physical Therapy

## 2013-09-12 DIAGNOSIS — IMO0001 Reserved for inherently not codable concepts without codable children: Secondary | ICD-10-CM | POA: Diagnosis not present

## 2013-09-15 ENCOUNTER — Ambulatory Visit: Payer: Commercial Managed Care - PPO | Admitting: Physical Therapy

## 2013-09-15 DIAGNOSIS — IMO0001 Reserved for inherently not codable concepts without codable children: Secondary | ICD-10-CM | POA: Diagnosis not present

## 2013-09-17 ENCOUNTER — Ambulatory Visit: Payer: Commercial Managed Care - PPO | Admitting: Physical Therapy

## 2013-09-17 DIAGNOSIS — IMO0001 Reserved for inherently not codable concepts without codable children: Secondary | ICD-10-CM | POA: Diagnosis not present

## 2013-09-19 ENCOUNTER — Ambulatory Visit: Payer: Commercial Managed Care - PPO | Admitting: Physical Therapy

## 2013-09-19 DIAGNOSIS — IMO0001 Reserved for inherently not codable concepts without codable children: Secondary | ICD-10-CM | POA: Diagnosis not present

## 2013-09-22 ENCOUNTER — Ambulatory Visit: Payer: Commercial Managed Care - PPO | Admitting: Physical Therapy

## 2013-09-22 DIAGNOSIS — IMO0001 Reserved for inherently not codable concepts without codable children: Secondary | ICD-10-CM | POA: Diagnosis not present

## 2013-09-23 ENCOUNTER — Encounter: Payer: Commercial Managed Care - PPO | Admitting: Physical Therapy

## 2013-09-24 ENCOUNTER — Ambulatory Visit: Payer: Commercial Managed Care - PPO | Admitting: Physical Therapy

## 2013-09-24 DIAGNOSIS — IMO0001 Reserved for inherently not codable concepts without codable children: Secondary | ICD-10-CM | POA: Diagnosis not present

## 2013-09-25 ENCOUNTER — Encounter: Payer: Commercial Managed Care - PPO | Admitting: Physical Therapy

## 2013-09-29 ENCOUNTER — Ambulatory Visit: Payer: Commercial Managed Care - PPO | Admitting: Physical Therapy

## 2013-09-29 DIAGNOSIS — IMO0001 Reserved for inherently not codable concepts without codable children: Secondary | ICD-10-CM | POA: Diagnosis not present

## 2013-09-30 ENCOUNTER — Encounter: Payer: Commercial Managed Care - PPO | Admitting: Physical Therapy

## 2013-10-02 ENCOUNTER — Ambulatory Visit: Payer: Commercial Managed Care - PPO | Attending: Oncology | Admitting: Physical Therapy

## 2013-10-02 ENCOUNTER — Encounter: Payer: Commercial Managed Care - PPO | Admitting: Physical Therapy

## 2013-10-02 DIAGNOSIS — M25519 Pain in unspecified shoulder: Secondary | ICD-10-CM | POA: Insufficient documentation

## 2013-10-02 DIAGNOSIS — I890 Lymphedema, not elsewhere classified: Secondary | ICD-10-CM | POA: Diagnosis not present

## 2013-10-02 DIAGNOSIS — Z923 Personal history of irradiation: Secondary | ICD-10-CM | POA: Insufficient documentation

## 2013-10-02 DIAGNOSIS — Z86718 Personal history of other venous thrombosis and embolism: Secondary | ICD-10-CM | POA: Diagnosis not present

## 2013-10-02 DIAGNOSIS — C50919 Malignant neoplasm of unspecified site of unspecified female breast: Secondary | ICD-10-CM | POA: Diagnosis not present

## 2013-10-02 DIAGNOSIS — Z9221 Personal history of antineoplastic chemotherapy: Secondary | ICD-10-CM | POA: Diagnosis not present

## 2013-10-02 DIAGNOSIS — IMO0001 Reserved for inherently not codable concepts without codable children: Secondary | ICD-10-CM | POA: Diagnosis not present

## 2013-10-02 DIAGNOSIS — M24519 Contracture, unspecified shoulder: Secondary | ICD-10-CM | POA: Insufficient documentation

## 2013-10-02 DIAGNOSIS — Z4889 Encounter for other specified surgical aftercare: Secondary | ICD-10-CM | POA: Insufficient documentation

## 2013-10-07 ENCOUNTER — Encounter: Payer: Commercial Managed Care - PPO | Admitting: Physical Therapy

## 2013-10-07 ENCOUNTER — Ambulatory Visit: Payer: Commercial Managed Care - PPO | Admitting: Physical Therapy

## 2013-10-07 DIAGNOSIS — IMO0001 Reserved for inherently not codable concepts without codable children: Secondary | ICD-10-CM | POA: Diagnosis not present

## 2013-10-07 DIAGNOSIS — C50919 Malignant neoplasm of unspecified site of unspecified female breast: Secondary | ICD-10-CM | POA: Diagnosis not present

## 2013-10-07 DIAGNOSIS — M25519 Pain in unspecified shoulder: Secondary | ICD-10-CM | POA: Diagnosis not present

## 2013-10-07 DIAGNOSIS — M24519 Contracture, unspecified shoulder: Secondary | ICD-10-CM | POA: Diagnosis not present

## 2013-10-07 DIAGNOSIS — I890 Lymphedema, not elsewhere classified: Secondary | ICD-10-CM | POA: Diagnosis not present

## 2013-10-07 DIAGNOSIS — Z4889 Encounter for other specified surgical aftercare: Secondary | ICD-10-CM | POA: Diagnosis not present

## 2013-10-09 ENCOUNTER — Encounter: Payer: Commercial Managed Care - PPO | Admitting: Physical Therapy

## 2013-10-13 ENCOUNTER — Ambulatory Visit: Payer: Commercial Managed Care - PPO | Admitting: Physical Therapy

## 2013-10-14 ENCOUNTER — Encounter: Payer: Commercial Managed Care - PPO | Admitting: Physical Therapy

## 2013-10-15 ENCOUNTER — Ambulatory Visit: Payer: Commercial Managed Care - PPO | Admitting: Physical Therapy

## 2013-10-16 ENCOUNTER — Encounter: Payer: Commercial Managed Care - PPO | Admitting: Physical Therapy

## 2013-10-17 ENCOUNTER — Other Ambulatory Visit: Payer: Self-pay | Admitting: Endocrinology

## 2013-10-17 ENCOUNTER — Ambulatory Visit: Payer: Commercial Managed Care - PPO | Admitting: Physical Therapy

## 2013-10-17 DIAGNOSIS — C50919 Malignant neoplasm of unspecified site of unspecified female breast: Secondary | ICD-10-CM | POA: Diagnosis not present

## 2013-10-17 DIAGNOSIS — Z4889 Encounter for other specified surgical aftercare: Secondary | ICD-10-CM | POA: Diagnosis not present

## 2013-10-17 DIAGNOSIS — I890 Lymphedema, not elsewhere classified: Secondary | ICD-10-CM | POA: Diagnosis not present

## 2013-10-17 DIAGNOSIS — M25519 Pain in unspecified shoulder: Secondary | ICD-10-CM | POA: Diagnosis not present

## 2013-10-17 DIAGNOSIS — M24519 Contracture, unspecified shoulder: Secondary | ICD-10-CM | POA: Diagnosis not present

## 2013-10-17 DIAGNOSIS — IMO0001 Reserved for inherently not codable concepts without codable children: Secondary | ICD-10-CM | POA: Diagnosis not present

## 2013-10-21 ENCOUNTER — Encounter: Payer: Commercial Managed Care - PPO | Admitting: Physical Therapy

## 2013-10-23 ENCOUNTER — Ambulatory Visit: Payer: Commercial Managed Care - PPO | Admitting: Physical Therapy

## 2013-10-23 DIAGNOSIS — IMO0001 Reserved for inherently not codable concepts without codable children: Secondary | ICD-10-CM | POA: Diagnosis not present

## 2013-10-23 DIAGNOSIS — Z4889 Encounter for other specified surgical aftercare: Secondary | ICD-10-CM | POA: Diagnosis not present

## 2013-10-23 DIAGNOSIS — C50919 Malignant neoplasm of unspecified site of unspecified female breast: Secondary | ICD-10-CM | POA: Diagnosis not present

## 2013-10-23 DIAGNOSIS — M25519 Pain in unspecified shoulder: Secondary | ICD-10-CM | POA: Diagnosis not present

## 2013-10-23 DIAGNOSIS — I890 Lymphedema, not elsewhere classified: Secondary | ICD-10-CM | POA: Diagnosis not present

## 2013-10-23 DIAGNOSIS — M24519 Contracture, unspecified shoulder: Secondary | ICD-10-CM | POA: Diagnosis not present

## 2013-10-24 ENCOUNTER — Ambulatory Visit: Payer: Commercial Managed Care - PPO | Admitting: Physical Therapy

## 2013-10-27 ENCOUNTER — Ambulatory Visit: Payer: Commercial Managed Care - PPO | Admitting: Physical Therapy

## 2013-10-27 DIAGNOSIS — IMO0001 Reserved for inherently not codable concepts without codable children: Secondary | ICD-10-CM | POA: Diagnosis not present

## 2013-10-27 DIAGNOSIS — C50919 Malignant neoplasm of unspecified site of unspecified female breast: Secondary | ICD-10-CM | POA: Diagnosis not present

## 2013-10-27 DIAGNOSIS — I890 Lymphedema, not elsewhere classified: Secondary | ICD-10-CM | POA: Diagnosis not present

## 2013-10-27 DIAGNOSIS — M25519 Pain in unspecified shoulder: Secondary | ICD-10-CM | POA: Diagnosis not present

## 2013-10-27 DIAGNOSIS — M24519 Contracture, unspecified shoulder: Secondary | ICD-10-CM | POA: Diagnosis not present

## 2013-10-27 DIAGNOSIS — Z4889 Encounter for other specified surgical aftercare: Secondary | ICD-10-CM | POA: Diagnosis not present

## 2013-10-29 ENCOUNTER — Ambulatory Visit: Payer: Commercial Managed Care - PPO | Admitting: Physical Therapy

## 2013-10-29 DIAGNOSIS — C50919 Malignant neoplasm of unspecified site of unspecified female breast: Secondary | ICD-10-CM | POA: Diagnosis not present

## 2013-10-29 DIAGNOSIS — I890 Lymphedema, not elsewhere classified: Secondary | ICD-10-CM | POA: Diagnosis not present

## 2013-10-29 DIAGNOSIS — M25519 Pain in unspecified shoulder: Secondary | ICD-10-CM | POA: Diagnosis not present

## 2013-10-29 DIAGNOSIS — M24519 Contracture, unspecified shoulder: Secondary | ICD-10-CM | POA: Diagnosis not present

## 2013-10-29 DIAGNOSIS — Z4889 Encounter for other specified surgical aftercare: Secondary | ICD-10-CM | POA: Diagnosis not present

## 2013-10-29 DIAGNOSIS — IMO0001 Reserved for inherently not codable concepts without codable children: Secondary | ICD-10-CM | POA: Diagnosis not present

## 2013-10-31 ENCOUNTER — Ambulatory Visit: Payer: Commercial Managed Care - PPO | Attending: Oncology | Admitting: Physical Therapy

## 2013-10-31 DIAGNOSIS — M25512 Pain in left shoulder: Secondary | ICD-10-CM | POA: Diagnosis not present

## 2013-10-31 DIAGNOSIS — Z9221 Personal history of antineoplastic chemotherapy: Secondary | ICD-10-CM | POA: Diagnosis not present

## 2013-10-31 DIAGNOSIS — I890 Lymphedema, not elsewhere classified: Secondary | ICD-10-CM | POA: Diagnosis not present

## 2013-10-31 DIAGNOSIS — M24512 Contracture, left shoulder: Secondary | ICD-10-CM | POA: Diagnosis not present

## 2013-10-31 DIAGNOSIS — C50919 Malignant neoplasm of unspecified site of unspecified female breast: Secondary | ICD-10-CM | POA: Diagnosis not present

## 2013-10-31 DIAGNOSIS — Z923 Personal history of irradiation: Secondary | ICD-10-CM | POA: Insufficient documentation

## 2013-10-31 DIAGNOSIS — Z9012 Acquired absence of left breast and nipple: Secondary | ICD-10-CM | POA: Insufficient documentation

## 2013-11-03 ENCOUNTER — Ambulatory Visit: Payer: Commercial Managed Care - PPO | Admitting: Physical Therapy

## 2013-11-03 DIAGNOSIS — C50919 Malignant neoplasm of unspecified site of unspecified female breast: Secondary | ICD-10-CM | POA: Diagnosis not present

## 2013-11-05 ENCOUNTER — Ambulatory Visit: Payer: Commercial Managed Care - PPO | Admitting: Physical Therapy

## 2013-11-05 DIAGNOSIS — C50919 Malignant neoplasm of unspecified site of unspecified female breast: Secondary | ICD-10-CM | POA: Diagnosis not present

## 2013-11-07 ENCOUNTER — Ambulatory Visit: Payer: Commercial Managed Care - PPO | Admitting: Physical Therapy

## 2013-11-07 DIAGNOSIS — C50919 Malignant neoplasm of unspecified site of unspecified female breast: Secondary | ICD-10-CM | POA: Diagnosis not present

## 2013-11-26 ENCOUNTER — Other Ambulatory Visit: Payer: Commercial Managed Care - PPO

## 2013-12-01 ENCOUNTER — Ambulatory Visit: Payer: Commercial Managed Care - PPO | Admitting: Endocrinology

## 2013-12-01 ENCOUNTER — Encounter: Payer: Self-pay | Admitting: Adult Health

## 2013-12-16 ENCOUNTER — Ambulatory Visit: Payer: Commercial Managed Care - PPO | Attending: Oncology | Admitting: Physical Therapy

## 2013-12-16 DIAGNOSIS — M25612 Stiffness of left shoulder, not elsewhere classified: Secondary | ICD-10-CM | POA: Insufficient documentation

## 2013-12-16 NOTE — Therapy (Signed)
Physical Therapy Treatment  Patient Details  Name: [name] MRN: [id number] Date of Birth: [birth year]  Encounter Date: 12/16/2013      PT End of Session - 12/16/13 1734    Visit Number 25   Date for PT Re-Evaluation 02/13/14   PT Start Time 1355   PT Stop Time 1443   PT Time Calculation (min) 48 min      Past Medical History  Diagnosis Date  . Breast mass in female     fibrocystic changes  . Hyperlipidemia   . Nausea   . Hypertension   . Nausea & vomiting     for approx. 2 weeks  . Cataracts, bilateral   . Diabetes mellitus     insulin dependant - uses pump, Dr. Dwyane Dee  . Breast cancer     T2N2 tiple negative left breast ca   . S/P chemotherapy, time since 4-12 weeks   . History of chemotherapy     taxotere/cytoxan with neulasta support  . Lymphedema of arm     left upper extremity  . Blood clot in vein     nonocclusive clot right subclavian vein, on Xarelto for 6 months  . Allergy   . Breast cancer   . Radiation 08/21/11-10/05/11    Left chestwll/Supraclav./left PAB/left scar    Past Surgical History  Procedure Laterality Date  . Bladder tacking  01/2009  . Shoulder surgery  04/26/10    left  . Tubal ligation  12/1999  . Left mastectomy  02/21/2011  . Portacath placement  02/21/2011    Procedure: INSERTION PORT-A-CATH;  Surgeon: Adin Hector, MD;  Location: [facility];  Service: General;  Laterality: N/A;  . Mastectomy modified radical  02/21/11    left,invasive  multifocal grade III ductal ca,high grade dcis,lymph/vascular invasion invasion (7/14) nodes positive mets  . Port-a-cath removal  10/13/2011    Procedure: MINOR REMOVAL PORT-A-CATH;  Surgeon: Adin Hector, MD;  Location: [facility];  Service: General;  Laterality: Right;  PORT-A CATH REMOVAL  . Mass excision  02/19/2012    Procedure: MINOR EXCISION OF MASS;  Surgeon: Adin Hector, MD;  Location: [facility];  Service: General;  Laterality: Left;  . Abdominal  hysterectomy  2002    still has ovaries     There were no vitals taken for this visit.  Visit Diagnosis:  Stiffness of left shoulder joint - Plan: PT plan of care cert/re-cert      Subjective Assessment - 12/16/13 1401    Symptoms I've been exhausted, keeping way too busy.  Nothing's changed with the arm; swelling hasn't gotten worse. motion is about the same.  Would have had to pay $1100.   Pertinent History Returns here after not having been in in several weeks; trying to decide if therapy is beneficial enough to continue.   Currently in Pain? No/denies          [facility] PT Assessment - 12/16/13 0001    Assessment   Medical Diagnosis breast cancer   Onset Date 02/21/11   Precautions   Precautions Other (comment)   Precaution Comments cancer precautions   AROM   Left Shoulder Flexion 130 Degrees   Left Shoulder ABduction 90 Degrees   Left Shoulder Internal Rotation 80 Degrees   Left Shoulder External Rotation 50 Degrees   PROM   Left Shoulder Flexion 136 Degrees   Left Shoulder ABduction 95 Degrees  Plan - 12/16/13 1736    Clinical Impression Statement Pt. who was seen here this fall, but hasn't been in since 11/05/13, now wants to continue therapy toward goals of improved use of left arm.   Pt will benefit from skilled therapeutic intervention in order to improve on the following deficits Decreased range of motion   Rehab Potential Good   PT Frequency 2x / week   PT Duration 6 weeks   PT Treatment/Interventions Manual techniques;Manual lymph drainage;Therapeutic exercise;Patient/family education   PT Next Visit Plan Continue with left shoulder PROM, AAROM, and myofascial release techniques.   Consulted and Agree with Plan of Care Patient        Problem List Patient Active Problem List   Diagnosis Date Noted  . Acute upper respiratory infections of unspecified site 04/25/2013  . Unspecified essential hypertension 12/04/2012  . Cough  09/01/2012  . Other and unspecified ovarian cyst 08/12/2012  . Pap smear for cervical cancer screening 08/12/2012  . Diabetic retinopathy 07/12/2012  . Jaw pain 06/26/2012  . Mass of left side of neck 02/06/2012  . Breast carcinoma metastatic, skin 05/08/2011  . Cancer of left breast 01/19/2011  . Patellofemoral syndrome, Right 03/10/2010  . THORACIC SOMATIC DYSFUNCTION 02/15/2010  . SOMATIC DYSFUNCTION, RIB CAGE 02/15/2010  . ROTATOR CUFF INJURY, LEFT SHOULDER 11/09/2009  . HOT FLASHES 06/14/2007  . DM, UNCOMPLICATED, TYPE I, UNCONTROLLED 11/16/2006  . HYPERLIPIDEMIA 11/16/2006  . DEPRESSION, MAJOR 11/16/2006            LYMPHEDEMA/ONCOLOGY QUESTIONNAIRE - 12/16/13 1415    Lymphedema Assessments   Lymphedema Assessments Upper extremities   Left Upper Extremity Lymphedema   Olecranon Process 29 cm   15 cm Proximal to Ulnar Styloid Process 28.2 cm   Just Proximal to Ulnar Styloid Process 17.4 cm   Across Hand at [employer] 19.2 cm   At [place] of 2nd Digit 6.2 cm   Other 12 cm. proximal to olecranon: 35.1                                      Short Term Clinic Goals - 12/16/13 1738    CC Short Term Goal  #1   Title Left shoulder active abduction 105 degrees.   Baseline 90 degrees   Time 3   Period Weeks          Long Term Clinic Goals - 12/16/13 1740    CC Long Term Goal  #2   Title Left shoulder active flexion 145 degrees for improved overhead reach.   Time 6   Period Weeks   Status New   CC Long Term Goal  #3   Title Left shoulder active external rotation at least 70 degrees   Time 6   Period Weeks   Status New          SALISBURY,DONNA, PT 12/16/2013, 5:44 PM

## 2013-12-18 ENCOUNTER — Ambulatory Visit: Payer: Commercial Managed Care - PPO

## 2013-12-18 DIAGNOSIS — M25612 Stiffness of left shoulder, not elsewhere classified: Secondary | ICD-10-CM | POA: Diagnosis not present

## 2013-12-18 NOTE — Therapy (Signed)
Physical Therapy Treatment  Patient Details  Name: [name] MRN: [id number] Date of Birth: 02-May-1970  Encounter Date: 12/18/2013      PT End of Session - 12/18/13 1347    Visit Number 26   Date for PT Re-Evaluation 02/13/14   PT Start Time 1308   PT Stop Time 1346   PT Time Calculation (min) 38 min      Past Medical History  Diagnosis Date  . Breast mass in female     fibrocystic changes  . Hyperlipidemia   . Nausea   . Hypertension   . Nausea & vomiting     for approx. 2 weeks  . Cataracts, bilateral   . Diabetes mellitus     insulin dependant - uses pump, Dr. Dwyane Dee  . Breast cancer     T2N2 tiple negative left breast ca   . S/P chemotherapy, time since 4-12 weeks   . History of chemotherapy     taxotere/cytoxan with neulasta support  . Lymphedema of arm     left upper extremity  . Blood clot in vein     nonocclusive clot right subclavian vein, on Xarelto for 6 months  . Allergy   . Breast cancer   . Radiation 08/21/11-10/05/11    Left chestwll/Supraclav./left PAB/left scar    Past Surgical History  Procedure Laterality Date  . Bladder tacking  01/2009  . Shoulder surgery  04/26/10    left  . Tubal ligation  12/1999  . Left mastectomy  02/21/2011  . Portacath placement  02/21/2011    Procedure: INSERTION PORT-A-CATH;  Surgeon: Adin Hector, MD;  Location: [place];  Service: General;  Laterality: N/A;  . Mastectomy modified radical  02/21/11    left,invasive  multifocal grade III ductal ca,high grade dcis,lymph/vascular invasion invasion (7/14) nodes positive mets  . Port-a-cath removal  10/13/2011    Procedure: MINOR REMOVAL PORT-A-CATH;  Surgeon: Adin Hector, MD;  Location: [place];  Service: General;  Laterality: Right;  PORT-A CATH REMOVAL  . Mass excision  02/19/2012    Procedure: MINOR EXCISION OF MASS;  Surgeon: Adin Hector, MD;  Location: [place];  Service: General;  Laterality: Left;  . Abdominal  hysterectomy  2002    still has ovaries     There were no vitals taken for this visit.  Visit Diagnosis:  Stiffness of left shoulder joint      Subjective Assessment - 12/18/13 1314    Symptoms Not having any pain right now. Havent heard back from [facility] regarding possible change in pricing. They wanted to say itd bee $1100 with or without nighttime garment because they are out of network for me.           [facility] PT Assessment - 12/18/13 0001    AROM   Left Shoulder Flexion 131 Degrees   Left Shoulder ABduction 105 Degrees   Left Shoulder Internal Rotation 80 Degrees   Left Shoulder External Rotation 64 Degrees          [facility] Adult PT Treatment/Exercise - 12/18/13 0001    Shoulder Exercises: Pulleys   Flexion 3 minutes   ABduction 2 minutes   Manual Therapy   Manual Therapy --  In Supine   Myofascial Release To Lt chest wall with stretching, UE pulling throughout PROM   Passive ROM To Lt shoulder into flexion, abduction, and neural tension stretch to patients tolerance   Shoulder Exercises: Therapy Ball   Flexion 10  reps  Roll yellow ball up wall                Plan - 12/18/13 1347    Clinical Impression Statement Patient is very stiff at end PROM and has trouble not compensating with end range AROM.    Pt will benefit from skilled therapeutic intervention in order to improve on the following deficits Decreased range of motion   PT Treatment/Interventions Manual techniques;Manual lymph drainage;Therapeutic exercise;Patient/family education   PT Next Visit Plan Continue with left shoulder PROM, AAROM, and myofascial release techniques.        Problem List Patient Active Problem List   Diagnosis Date Noted  . Acute upper respiratory infections of unspecified site 04/25/2013  . Unspecified essential hypertension 12/04/2012  . Cough 09/01/2012  . Other and unspecified ovarian cyst 08/12/2012  . Pap smear for cervical cancer screening 08/12/2012  .  Diabetic retinopathy 07/12/2012  . Jaw pain 06/26/2012  . Mass of left side of neck 02/06/2012  . Breast carcinoma metastatic, skin 05/08/2011  . Cancer of left breast 01/19/2011  . Patellofemoral syndrome, Right 03/10/2010  . THORACIC SOMATIC DYSFUNCTION 02/15/2010  . SOMATIC DYSFUNCTION, RIB CAGE 02/15/2010  . ROTATOR CUFF INJURY, LEFT SHOULDER 11/09/2009  . HOT FLASHES 06/14/2007  . DM, UNCOMPLICATED, TYPE I, UNCONTROLLED 11/16/2006  . HYPERLIPIDEMIA 11/16/2006  . DEPRESSION, MAJOR 11/16/2006                                          Short Term Clinic Goals - 12/18/13 1348    CC Short Term Goal  #1   Title Left shoulder active abduction 105 degrees.   Time 3   Period Weeks   Status Achieved            Otelia Limes, PTA 12/18/2013, 1:51 PM

## 2013-12-19 ENCOUNTER — Other Ambulatory Visit: Payer: Self-pay

## 2013-12-19 DIAGNOSIS — Z9012 Acquired absence of left breast and nipple: Secondary | ICD-10-CM

## 2013-12-19 DIAGNOSIS — Z1231 Encounter for screening mammogram for malignant neoplasm of breast: Secondary | ICD-10-CM

## 2013-12-22 ENCOUNTER — Ambulatory Visit: Payer: Commercial Managed Care - PPO | Admitting: Physical Therapy

## 2013-12-22 DIAGNOSIS — M25612 Stiffness of left shoulder, not elsewhere classified: Secondary | ICD-10-CM

## 2013-12-22 NOTE — Therapy (Signed)
Physical Therapy Treatment  Patient Details  Name: [name] MRN: [id number] Date of Birth: 03/28/1970  Encounter Date: 12/22/2013      PT End of Session - 12/22/13 1320    Visit Number 27   Date for PT Re-Evaluation 02/13/14   PT Start Time 1106   PT Stop Time 1147   PT Time Calculation (min) 41 min      Past Medical History  Diagnosis Date  . Breast mass in female     fibrocystic changes  . Hyperlipidemia   . Nausea   . Hypertension   . Nausea & vomiting     for approx. 2 weeks  . Cataracts, bilateral   . Diabetes mellitus     insulin dependant - uses pump, Dr. Dwyane Dee  . Breast cancer     T2N2 tiple negative left breast ca   . S/P chemotherapy, time since 4-12 weeks   . History of chemotherapy     taxotere/cytoxan with neulasta support  . Lymphedema of arm     left upper extremity  . Blood clot in vein     nonocclusive clot right subclavian vein, on Xarelto for 6 months  . Allergy   . Breast cancer   . Radiation 08/21/11-10/05/11    Left chestwll/Supraclav./left PAB/left scar    Past Surgical History  Procedure Laterality Date  . Bladder tacking  01/2009  . Shoulder surgery  04/26/10    left  . Tubal ligation  12/1999  . Left mastectomy  02/21/2011  . Portacath placement  02/21/2011    Procedure: INSERTION PORT-A-CATH;  Surgeon: Adin Hector, MD;  Location: [facility];  Service: General;  Laterality: N/A;  . Mastectomy modified radical  02/21/11    left,invasive  multifocal grade III ductal ca,high grade dcis,lymph/vascular invasion invasion (7/14) nodes positive mets  . Port-a-cath removal  10/13/2011    Procedure: MINOR REMOVAL PORT-A-CATH;  Surgeon: Adin Hector, MD;  Location: [facility];  Service: General;  Laterality: Right;  PORT-A CATH REMOVAL  . Mass excision  02/19/2012    Procedure: MINOR EXCISION OF MASS;  Surgeon: Adin Hector, MD;  Location: [place];  Service: General;  Laterality: Left;  . Abdominal  hysterectomy  2002    still has ovaries     There were no vitals taken for this visit.  Visit Diagnosis:  No diagnosis found.      Subjective Assessment - 12/22/13 1116    Symptoms Pt says she really does not have time to do exercises at home, but she does feel that coming here is beneficial   Currently in Pain? No/denies            [facility] Adult PT Treatment/Exercise - 12/22/13 1313    Neck Exercises: Stretches   Lower Cervical/Upper Thoracic Stretch 3 reps   Neck Exercises: Seated   Cervical Rotation 5 reps   X to V 5 reps   W Back 5 reps   Shoulder Shrugs 5 reps   Shoulder Rolls Backwards;5 reps   Other Seated Exercise neural glide with head turned to opposite side   Other Seated Exercise neural flossing with both arms out to side at about 45 degrees and head rotation   Lumbar Exercises: Supine   Ab Set 5 reps   Bent Knee Raise 10 reps   Straight Leg Raise 5 reps   Lumbar Exercises: Sidelying   Clam 10 reps   Shoulder Exercises: Standing   External Rotation  Weight (lbs) 2 # with left arm in right sidelying x 10 repetitions   ABduction AROM  in sidelying   Other Standing Exercises modified downward dog   Other Standing Exercises chest stretch with turn away from wall   Shoulder Exercises: ROM/Strengthening   Wall Pushups 5 reps  slide arms up a few inches   Other ROM/Strengthening Exercises supine chest stretch                Plan - 12/22/13 1320    Clinical Impression Statement Ms. Haan wants to continue to come to physical therapy for exercise for strengthening.  She is busy with school and now helping to care for her 33 month old neice. She did well with exercise program today.   PT Next Visit Plan Continue with left shoulder PROM, AAROM, and myofascial release techniques. Add core and LE strengthening for general strength and encourage active range of motion with left arm as she is able        Problem List Patient Active Problem List    Diagnosis Date Noted  . Acute upper respiratory infections of unspecified site 04/25/2013  . Unspecified essential hypertension 12/04/2012  . Cough 09/01/2012  . Other and unspecified ovarian cyst 08/12/2012  . Pap smear for cervical cancer screening 08/12/2012  . Diabetic retinopathy 07/12/2012  . Jaw pain 06/26/2012  . Mass of left side of neck 02/06/2012  . Breast carcinoma metastatic, skin 05/08/2011  . Cancer of left breast 01/19/2011  . Patellofemoral syndrome, Right 03/10/2010  . THORACIC SOMATIC DYSFUNCTION 02/15/2010  . SOMATIC DYSFUNCTION, RIB CAGE 02/15/2010  . ROTATOR CUFF INJURY, LEFT SHOULDER 11/09/2009  . HOT FLASHES 06/14/2007  . DM, UNCOMPLICATED, TYPE I, UNCONTROLLED 11/16/2006  . HYPERLIPIDEMIA 11/16/2006  . DEPRESSION, MAJOR 11/16/2006      [name], PT   [name] 12/22/2013, 1:26 PM

## 2013-12-30 ENCOUNTER — Ambulatory Visit: Payer: Commercial Managed Care - PPO | Admitting: Physical Therapy

## 2013-12-30 ENCOUNTER — Ambulatory Visit: Payer: Commercial Managed Care - PPO | Attending: Oncology | Admitting: Physical Therapy

## 2013-12-30 DIAGNOSIS — M25612 Stiffness of left shoulder, not elsewhere classified: Secondary | ICD-10-CM | POA: Insufficient documentation

## 2014-01-01 ENCOUNTER — Ambulatory Visit: Payer: Commercial Managed Care - PPO | Admitting: Physical Therapy

## 2014-01-05 ENCOUNTER — Ambulatory Visit
Admission: RE | Admit: 2014-01-05 | Discharge: 2014-01-05 | Disposition: A | Discharge status: Home / Self Care | Payer: Commercial Managed Care - PPO | Source: Ambulatory Visit

## 2014-01-05 DIAGNOSIS — Z9012 Acquired absence of left breast and nipple: Secondary | ICD-10-CM

## 2014-01-05 DIAGNOSIS — Z1231 Encounter for screening mammogram for malignant neoplasm of breast: Secondary | ICD-10-CM

## 2014-01-09 ENCOUNTER — Ambulatory Visit: Payer: Commercial Managed Care - PPO | Admitting: Physical Therapy

## 2014-01-09 DIAGNOSIS — R5381 Other malaise: Secondary | ICD-10-CM

## 2014-01-09 DIAGNOSIS — M25612 Stiffness of left shoulder, not elsewhere classified: Secondary | ICD-10-CM

## 2014-01-09 NOTE — Therapy (Signed)
[place], [place], [address] Phone: [phone]   Fax:  [phone]  Physical Therapy Treatment  Patient Details  Name: [name] MRN: [id number] Date of Birth: [birth year]  Encounter Date: 01/09/2014      PT End of Session - 01/09/14 0850    Visit Number 28   Date for PT Re-Evaluation 02/13/14   PT Start Time 0805   PT Stop Time 2725   PT Time Calculation (min) 42 min      Past Medical History  Diagnosis Date  . Breast mass in female     fibrocystic changes  . Hyperlipidemia   . Nausea   . Hypertension   . Nausea & vomiting     for approx. 2 weeks  . Cataracts, bilateral   . Diabetes mellitus     insulin dependant - uses pump, Dr. Dwyane Dee  . Breast cancer     T2N2 tiple negative left breast ca   . S/P chemotherapy, time since 4-12 weeks   . History of chemotherapy     taxotere/cytoxan with neulasta support  . Lymphedema of arm     left upper extremity  . Blood clot in vein     nonocclusive clot right subclavian vein, on Xarelto for 6 months  . Allergy   . Breast cancer   . Radiation 08/21/11-10/05/11    Left chestwll/Supraclav./left PAB/left scar    Past Surgical History  Procedure Laterality Date  . Bladder tacking  01/2009  . Shoulder surgery  04/26/10    left  . Tubal ligation  12/1999  . Left mastectomy  02/21/2011  . Portacath placement  02/21/2011    Procedure: INSERTION PORT-A-CATH;  Surgeon: Adin Hector, MD;  Location: [facility];  Service: General;  Laterality: N/A;  . Mastectomy modified radical  02/21/11    left,invasive  multifocal grade III ductal ca,high grade dcis,lymph/vascular invasion invasion (7/14) nodes positive mets  . Port-a-cath removal  10/13/2011    Procedure: MINOR REMOVAL PORT-A-CATH;  Surgeon: Adin Hector, MD;  Location: [facility];  Service: General;  Laterality: Right;  PORT-A CATH REMOVAL  . Mass excision  02/19/2012    Procedure: MINOR  EXCISION OF MASS;  Surgeon: Adin Hector, MD;  Location: [facility];  Service: General;  Laterality: Left;  . Abdominal hysterectomy  2002    still has ovaries     There were no vitals taken for this visit.  Visit Diagnosis:  Stiffness of left shoulder joint  Physical deconditioning      Subjective Assessment - 01/09/14 0809    Symptoms Pt comes in wearing her sleeve and glove.she just received it on Monday of this week .  She is having problems with irritation on the inside of the elbow.  It looks like the seam in on the medial side of the arm instead of the posterior aspect.She also received a Joneen Caraway Sleeve   Currently in Pain? No/denies          [facility] PT Assessment - 01/09/14 0841    AROM   Left Shoulder Flexion 132 Degrees   Left Shoulder ABduction 97 Degrees   Left Shoulder Internal Rotation 81 Degrees   Left Shoulder External Rotation 60 Degrees     Treatment: Assessed sleeve and gave advise for donning and doffing. Practiced with donning gloves. Pt mentioned that compression glove may be too loose She will continue to work with it. She also is using nigttime garment  at home and is pleased with her overall reduction in circumferental measurements. She states she wants to be realistic about her expectations for her range of motion and acknowledges that she still has limitations that might continue. She wants to move forward with learning a progressively increaseing strength program that might help decrease the incidence of lymphedema flare ups, help  Her with general fitness and continue with stretching program for her shoulder. She was encouraged to wear her sleeve while she is providing child care for her infant niece. Began introduction to strength ABC program         Plan - 01/09/14 1244    Clinical Impression Statement Adjusted fit of sleeve so that seam is posterior to arm and wide knitted band at anterior antecubital fossa She reported that this  felt better. She wants to continue physical therapy to learn the Strength ABC program so that she can continue the strengthening on her own at home.   PT Next Visit Plan Teach progressive strength training program ( Strength ABC) with logging               LYMPHEDEMA/ONCOLOGY QUESTIONNAIRE - 01/09/14 0835    Left Upper Extremity Lymphedema   10 cm Proximal to Olecranon Process 33.9 cm   Olecranon Process 27.9 cm   15 cm Proximal to Ulnar Styloid Process 28.1 cm   Just Proximal to Ulnar Styloid Process 16.6 cm   Across Hand at [employer] 19.5 cm   At [place] of 2nd Digit 6.4 cm            Long Term Clinic Goals - 01/09/14 1300    CC Long Term Goal  #1   Title Left shoulder active abduction 120 degrees for improved ADLs.   Time 6   Period Weeks   Status On-going   CC Long Term Goal  #2   Title Left shoulder active flexion 145 degrees for improved overhead reach.   Time 6   Period Weeks   Status On-going   CC Long Term Goal  #3   Title Left shoulder active external rotation at least 70 degrees   Time 6   Period Weeks   Status On-going   CC Long Term Goal  #4   Title verbalize and demonstrate understanding of Strength ABC program   Time 6   Period Weeks   Status New         Problem List Patient Active Problem List   Diagnosis Date Noted  . Acute upper respiratory infections of unspecified site 04/25/2013  . Unspecified essential hypertension 12/04/2012  . Cough 09/01/2012  . Other and unspecified ovarian cyst 08/12/2012  . Pap smear for cervical cancer screening 08/12/2012  . Diabetic retinopathy 07/12/2012  . Jaw pain 06/26/2012  . Mass of left side of neck 02/06/2012  . Breast carcinoma metastatic, skin 05/08/2011  . Cancer of left breast 01/19/2011  . Patellofemoral syndrome, Right 03/10/2010  . THORACIC SOMATIC DYSFUNCTION 02/15/2010  . SOMATIC DYSFUNCTION, RIB CAGE 02/15/2010  . ROTATOR CUFF INJURY, LEFT SHOULDER 11/09/2009  . HOT FLASHES  06/14/2007  . DM, UNCOMPLICATED, TYPE I, UNCONTROLLED 11/16/2006  . HYPERLIPIDEMIA 11/16/2006  . DEPRESSION, MAJOR 11/16/2006   [name]. Brown, PT  01/09/2014, 1:20 PM

## 2014-01-12 ENCOUNTER — Ambulatory Visit: Payer: Commercial Managed Care - PPO

## 2014-01-12 DIAGNOSIS — R5381 Other malaise: Secondary | ICD-10-CM

## 2014-01-12 DIAGNOSIS — M25612 Stiffness of left shoulder, not elsewhere classified: Secondary | ICD-10-CM | POA: Diagnosis not present

## 2014-01-12 NOTE — Therapy (Signed)
[place], [place], [address] Phone: [phone]   Fax:  [phone]  Physical Therapy Treatment  Patient Details  Name: [name] MRN: [id number] Date of Birth: 1970-09-24  Encounter Date: 01/12/2014      PT End of Session - 01/12/14 0955    Visit Number 29   Date for PT Re-Evaluation 02/13/14   PT Start Time 0937   PT Stop Time 1019   PT Time Calculation (min) 42 min      Past Medical History  Diagnosis Date  . Breast mass in female     fibrocystic changes  . Hyperlipidemia   . Nausea   . Hypertension   . Nausea & vomiting     for approx. 2 weeks  . Cataracts, bilateral   . Diabetes mellitus     insulin dependant - uses pump, Dr. Dwyane Dee  . Breast cancer     T2N2 tiple negative left breast ca   . S/P chemotherapy, time since 4-12 weeks   . History of chemotherapy     taxotere/cytoxan with neulasta support  . Lymphedema of arm     left upper extremity  . Blood clot in vein     nonocclusive clot right subclavian vein, on Xarelto for 6 months  . Allergy   . Breast cancer   . Radiation 08/21/11-10/05/11    Left chestwll/Supraclav./left PAB/left scar    Past Surgical History  Procedure Laterality Date  . Bladder tacking  01/2009  . Shoulder surgery  04/26/10    left  . Tubal ligation  12/1999  . Left mastectomy  02/21/2011  . Portacath placement  02/21/2011    Procedure: INSERTION PORT-A-CATH;  Surgeon: Adin Hector, MD;  Location: [facility];  Service: General;  Laterality: N/A;  . Mastectomy modified radical  02/21/11    left,invasive  multifocal grade III ductal ca,high grade dcis,lymph/vascular invasion invasion (7/14) nodes positive mets  . Port-a-cath removal  10/13/2011    Procedure: MINOR REMOVAL PORT-A-CATH;  Surgeon: Adin Hector, MD;  Location: [facility];  Service: General;  Laterality: Right;  PORT-A CATH REMOVAL  . Mass excision  02/19/2012    Procedure: MINOR  EXCISION OF MASS;  Surgeon: Adin Hector, MD;  Location: [facility];  Service: General;  Laterality: Left;  . Abdominal hysterectomy  2002    still has ovaries     There were no vitals taken for this visit.  Visit Diagnosis:  Stiffness of left shoulder joint  Physical deconditioning      Subjective Assessment - 01/12/14 0956    Symptoms Pt donned sleeve and glove upon arriving late to appt today. Reports that it is feeling better and elbow not irritated any longer.    Currently in Pain? No/denies            [facility] Adult PT Treatment/Exercise - 01/12/14 0001    Shoulder Exercises: ROM/Strengthening   Other ROM/Strengthening Exercises Strength after Breast CancerProgram; all stretches 3 reps, 10 sec holds; all strengthening with 1 lb and 10 reps today.          PT Education - 01/12/14 1019    Education provided Yes   Education Details ABC strengthening program   Person(s) Educated Patient   Methods Explanation;Demonstration;Handout;Tactile cues   Comprehension Verbalized understanding;Returned demonstration;Need further instruction              Plan - 01/12/14 1001    Clinical Impression Statement Pt easily  fatigued with stretches today, but tolerated strengthening with 1 lb well. Appears to be wearing sleeve correctly now   Pt will benefit from skilled therapeutic intervention in order to improve on the following deficits Decreased range of motion   Rehab Potential Good   PT Frequency 2x / week   PT Duration 6 weeks   PT Treatment/Interventions Manual techniques;Manual lymph drainage;Therapeutic exercise;Patient/family education   PT Next Visit Plan Cont to teach progressive strength training program ( Strength ABC) with logging and assess how pt is doing with this at home.                                Problem List Patient Active Problem List   Diagnosis Date Noted  . Acute upper respiratory infections of  unspecified site 04/25/2013  . Unspecified essential hypertension 12/04/2012  . Cough 09/01/2012  . Other and unspecified ovarian cyst 08/12/2012  . Pap smear for cervical cancer screening 08/12/2012  . Diabetic retinopathy 07/12/2012  . Jaw pain 06/26/2012  . Mass of left side of neck 02/06/2012  . Breast carcinoma metastatic, skin 05/08/2011  . Cancer of left breast 01/19/2011  . Patellofemoral syndrome, Right 03/10/2010  . THORACIC SOMATIC DYSFUNCTION 02/15/2010  . SOMATIC DYSFUNCTION, RIB CAGE 02/15/2010  . ROTATOR CUFF INJURY, LEFT SHOULDER 11/09/2009  . HOT FLASHES 06/14/2007  . DM, UNCOMPLICATED, TYPE I, UNCONTROLLED 11/16/2006  . HYPERLIPIDEMIA 11/16/2006  . DEPRESSION, MAJOR 11/16/2006    Otelia Limes, PTA 01/12/2014, 10:20 AM

## 2014-01-15 ENCOUNTER — Ambulatory Visit: Payer: Commercial Managed Care - PPO | Admitting: Physical Therapy

## 2014-01-15 DIAGNOSIS — M25612 Stiffness of left shoulder, not elsewhere classified: Secondary | ICD-10-CM | POA: Diagnosis not present

## 2014-01-15 DIAGNOSIS — R5381 Other malaise: Secondary | ICD-10-CM

## 2014-01-15 NOTE — Therapy (Signed)
[place], [place], [address] Phone: [phone]   Fax:  [phone]  Physical Therapy Treatment  Patient Details  Name: [name] MRN: [id number] Date of Birth: 24-Aug-1970  Encounter Date: 01/15/2014      PT End of Session - 01/15/14 1843    Visit Number 30   PT Start Time 0850   PT Stop Time 0930   PT Time Calculation (min) 40 min      Past Medical History  Diagnosis Date  . Breast mass in female     fibrocystic changes  . Hyperlipidemia   . Nausea   . Hypertension   . Nausea & vomiting     for approx. 2 weeks  . Cataracts, bilateral   . Diabetes mellitus     insulin dependant - uses pump, Dr. Dwyane Dee  . Breast cancer     T2N2 tiple negative left breast ca   . S/P chemotherapy, time since 4-12 weeks   . History of chemotherapy     taxotere/cytoxan with neulasta support  . Lymphedema of arm     left upper extremity  . Blood clot in vein     nonocclusive clot right subclavian vein, on Xarelto for 6 months  . Allergy   . Breast cancer   . Radiation 08/21/11-10/05/11    Left chestwll/Supraclav./left PAB/left scar    Past Surgical History  Procedure Laterality Date  . Bladder tacking  01/2009  . Shoulder surgery  04/26/10    left  . Tubal ligation  12/1999  . Left mastectomy  02/21/2011  . Portacath placement  02/21/2011    Procedure: INSERTION PORT-A-CATH;  Surgeon: Adin Hector, MD;  Location: [facility];  Service: General;  Laterality: N/A;  . Mastectomy modified radical  02/21/11    left,invasive  multifocal grade III ductal ca,high grade dcis,lymph/vascular invasion invasion (7/14) nodes positive mets  . Port-a-cath removal  10/13/2011    Procedure: MINOR REMOVAL PORT-A-CATH;  Surgeon: Adin Hector, MD;  Location: [facility];  Service: General;  Laterality: Right;  PORT-A CATH REMOVAL  . Mass excision  02/19/2012    Procedure: MINOR EXCISION OF MASS;  Surgeon:  Adin Hector, MD;  Location: [facility];  Service: General;  Laterality: Left;  . Abdominal hysterectomy  2002    still has ovaries     There were no vitals taken for this visit.  Visit Diagnosis:  Stiffness of left shoulder joint  Physical deconditioning      Subjective Assessment - 01/15/14 0905    Symptoms Feeling good. Did ok with exercises last time and is ready to keep going.  She is able to wear her compression sleeve most of the day every day, but has some elbown                    [facility] Adult PT Treatment/Exercise - 01/15/14 0920    Neck Exercises: Stretches   Lower Cervical/Upper Thoracic Stretch 3 reps   Lumbar Exercises: Supine   Ab Set 5 reps   Bent Knee Raise 10 reps   Straight Leg Raise 5 reps   Other Supine Lumbar Exercises lower trunk rotation   Lumbar Exercises: Sidelying   Clam 10 reps;Other (comment)  on each side   Knee/Hip Exercises: Standing   Heel Raises 10 reps;2 sets   Forward Step Up 2 sets;10 reps;Step Height: 6"   Functional Squat 2 sets;10 reps   Other  Standing Knee Exercises hip abduction  10 reps with each leg   Shoulder Exercises: Seated   Other Seated Exercises scaption 2 sets of 10 with 2# weight   Other Seated Exercises bicep curls 2 sets of 10 with 2#    Shoulder Exercises: ROM/Strengthening   Wall Pushups 5 reps   "W" Arms 2 sets of 10 with no weight   Other ROM/Strengthening Exercises schest press, 2 sets of 10 with 2 # weights   Other ROM/Strengthening Exercises tricep kickbacks                         Long Term Clinic Goals - 01/15/14 1845    CC Long Term Goal  #1   Title Left shoulder active abduction 120 degrees for improved ADLs.   Time 6   Period Weeks   Status On-going   CC Long Term Goal  #2   Title Left shoulder active flexion 145 degrees for improved overhead reach.   Time 6   Period Weeks   Status On-going   CC Long Term Goal  #3   Title Left shoulder active  external rotation at least 70 degrees   Period Weeks   Status On-going   CC Long Term Goal  #4   Title verbalize and demonstrate understanding of Strength ABC program   Time 6   Period Weeks   Status On-going            Plan - 01/15/14 1844    Clinical Impression Statement Nice job today with all exercises.  Good biomechanical form exhibited.  Pt likely ready to d/c in 2-3 visits if progress continues.  She is wearing sleeve for exercise and says she is wearing it every day.   PT Next Visit Plan Continue with strenth ABC program.  Hopefully pt will bring in her log so she can fill it out on her own        Problem List Patient Active Problem List   Diagnosis Date Noted  . Acute upper respiratory infections of unspecified site 04/25/2013  . Unspecified essential hypertension 12/04/2012  . Cough 09/01/2012  . Other and unspecified ovarian cyst 08/12/2012  . Pap smear for cervical cancer screening 08/12/2012  . Diabetic retinopathy 07/12/2012  . Jaw pain 06/26/2012  . Mass of left side of neck 02/06/2012  . Breast carcinoma metastatic, skin 05/08/2011  . Cancer of left breast 01/19/2011  . Patellofemoral syndrome, Right 03/10/2010  . THORACIC SOMATIC DYSFUNCTION 02/15/2010  . SOMATIC DYSFUNCTION, RIB CAGE 02/15/2010  . ROTATOR CUFF INJURY, LEFT SHOULDER 11/09/2009  . HOT FLASHES 06/14/2007  . DM, UNCOMPLICATED, TYPE I, UNCONTROLLED 11/16/2006  . HYPERLIPIDEMIA 11/16/2006  . DEPRESSION, MAJOR 11/16/2006  [name]. [name], PT   01/15/2014, 6:46 PM  [place], [place], [address] Phone: [phone]   Fax:  [phone]

## 2014-01-21 ENCOUNTER — Ambulatory Visit: Payer: Commercial Managed Care - PPO | Admitting: Physical Therapy

## 2014-01-21 DIAGNOSIS — M25612 Stiffness of left shoulder, not elsewhere classified: Secondary | ICD-10-CM | POA: Diagnosis not present

## 2014-01-21 DIAGNOSIS — R5381 Other malaise: Secondary | ICD-10-CM

## 2014-01-21 NOTE — Therapy (Signed)
[place], [place], [address] Phone: [phone]   Fax:  [phone]  Physical Therapy Treatment  Patient Details  Name: [name] MRN: [id number] Date of Birth: 09/07/1970  Encounter Date: 01/21/2014      PT End of Session - 01/21/14 1314    Visit Number 31   Date for PT Re-Evaluation 02/13/14   PT Start Time 0936   PT Stop Time 1018   PT Time Calculation (min) 42 min      Past Medical History  Diagnosis Date  . Breast mass in female     fibrocystic changes  . Hyperlipidemia   . Nausea   . Hypertension   . Nausea & vomiting     for approx. 2 weeks  . Cataracts, bilateral   . Diabetes mellitus     insulin dependant - uses pump, Dr. Dwyane Dee  . Breast cancer     T2N2 tiple negative left breast ca   . S/P chemotherapy, time since 4-12 weeks   . History of chemotherapy     taxotere/cytoxan with neulasta support  . Lymphedema of arm     left upper extremity  . Blood clot in vein     nonocclusive clot right subclavian vein, on Xarelto for 6 months  . Allergy   . Breast cancer   . Radiation 08/21/11-10/05/11    Left chestwll/Supraclav./left PAB/left scar    Past Surgical History  Procedure Laterality Date  . Bladder tacking  01/2009  . Shoulder surgery  04/26/10    left  . Tubal ligation  12/1999  . Left mastectomy  02/21/2011  . Portacath placement  02/21/2011    Procedure: INSERTION PORT-A-CATH;  Surgeon: Adin Hector, MD;  Location: [facility];  Service: General;  Laterality: N/A;  . Mastectomy modified radical  02/21/11    left,invasive  multifocal grade III ductal ca,high grade dcis,lymph/vascular invasion invasion (7/14) nodes positive mets  . Port-a-cath removal  10/13/2011    Procedure: MINOR REMOVAL PORT-A-CATH;  Surgeon: Adin Hector, MD;  Location: [facility];  Service: General;  Laterality: Right;  PORT-A CATH REMOVAL  . Mass excision  02/19/2012     Procedure: MINOR EXCISION OF MASS;  Surgeon: Adin Hector, MD;  Location: [facility];  Service: General;  Laterality: Left;  . Abdominal hysterectomy  2002    still has ovaries     There were no vitals taken for this visit.  Visit Diagnosis:  Stiffness of left shoulder joint  Physical deconditioning      Subjective Assessment - 01/21/14 0941    Symptoms Going good.  It's all the same.  Still notes limitations with using left arm.   Currently in Pain? No/denies             Performed, with therapist supervision and cueing, as much of the strength ABC class as we could get through at today's session, including all of the stretches done at the beginning x 15 seconds each, all three core exercises x 10 each, and chest press, squats, and overhead press (without weights) x 10 each.  Also instructed pt. in how to use the log to record her exercises.               PT Education - 01/21/14 1314    Education provided Yes   Education Details Strength ABC program reviewed and showed patient how to record exercise with the log.   Person(s) Educated  Patient   Methods Explanation;Demonstration;Verbal cues;Handout   Comprehension Verbalized understanding;Returned demonstration           Short Term Clinic Goals - 12/18/13 1348    CC Short Term Goal  #1   Title Left shoulder active abduction 105 degrees.   Time 3   Period Weeks   Status Achieved             Long Term Clinic Goals - 01/15/14 1845    CC Long Term Goal  #1   Title Left shoulder active abduction 120 degrees for improved ADLs.   Time 6   Period Weeks   Status On-going   CC Long Term Goal  #2   Title Left shoulder active flexion 145 degrees for improved overhead reach.   Time 6   Period Weeks   Status On-going   CC Long Term Goal  #3   Title Left shoulder active external rotation at least 70 degrees   Period Weeks   Status On-going   CC Long Term Goal  #4   Title verbalize and  demonstrate understanding of Strength ABC program   Time 6   Period Weeks   Status On-going            Plan - 01/21/14 1315    Clinical Impression Statement Pt. did well with exercises but did need cueing to remember things, partly because she hasn't done exercises at home since she is to do it twice a week and she has been coming here twice a week for it.  Got through approx. 2/3 of strength ABC program today; pt. was able to log her exercise, and was going to complete the program at home after her sessin today.   Pt will benefit from skilled therapeutic intervention in order to improve on the following deficits Decreased range of motion;Decreased activity tolerance;Decreased strength;Decreased knowledge of precautions   Rehab Potential Good   PT Frequency 2x / week   PT Next Visit Plan Continue with strength ABC program, probably starting part way through so as to complete the full program (not completed today).   PT Home Exercise Plan Pt. to do strength ABC program at home over the holiday weekend.   Consulted and Agree with Plan of Care Patient        Problem List Patient Active Problem List   Diagnosis Date Noted  . Acute upper respiratory infections of unspecified site 04/25/2013  . Unspecified essential hypertension 12/04/2012  . Cough 09/01/2012  . Other and unspecified ovarian cyst 08/12/2012  . Pap smear for cervical cancer screening 08/12/2012  . Diabetic retinopathy 07/12/2012  . Jaw pain 06/26/2012  . Mass of left side of neck 02/06/2012  . Breast carcinoma metastatic, skin 05/08/2011  . Cancer of left breast 01/19/2011  . Patellofemoral syndrome, Right 03/10/2010  . THORACIC SOMATIC DYSFUNCTION 02/15/2010  . SOMATIC DYSFUNCTION, RIB CAGE 02/15/2010  . ROTATOR CUFF INJURY, LEFT SHOULDER 11/09/2009  . HOT FLASHES 06/14/2007  . DM, UNCOMPLICATED, TYPE I, UNCONTROLLED 11/16/2006  . HYPERLIPIDEMIA 11/16/2006  . DEPRESSION, MAJOR 11/16/2006     Nassir Neidert 01/21/2014, 1:19 PM  [place], [place], [address] Phone: [phone]   Fax:  [phone]  Serafina Royals, [place]

## 2014-01-28 ENCOUNTER — Other Ambulatory Visit: Payer: Self-pay

## 2014-01-28 DIAGNOSIS — C50912 Malignant neoplasm of unspecified site of left female breast: Secondary | ICD-10-CM

## 2014-01-29 ENCOUNTER — Other Ambulatory Visit (HOSPITAL_BASED_OUTPATIENT_CLINIC_OR_DEPARTMENT_OTHER): Payer: Commercial Managed Care - PPO

## 2014-01-29 ENCOUNTER — Ambulatory Visit: Payer: Commercial Managed Care - PPO | Admitting: Physical Therapy

## 2014-01-29 DIAGNOSIS — R5381 Other malaise: Secondary | ICD-10-CM

## 2014-01-29 DIAGNOSIS — Z853 Personal history of malignant neoplasm of breast: Secondary | ICD-10-CM

## 2014-01-29 DIAGNOSIS — M25612 Stiffness of left shoulder, not elsewhere classified: Secondary | ICD-10-CM | POA: Diagnosis not present

## 2014-01-29 DIAGNOSIS — C50912 Malignant neoplasm of unspecified site of left female breast: Secondary | ICD-10-CM

## 2014-01-29 LAB — COMPREHENSIVE METABOLIC PANEL (CC13)
ALBUMIN: 3.7 g/dL (ref 3.5–5.0)
ALT: 38 U/L (ref 0–55)
ANION GAP: 9 meq/L (ref 3–11)
AST: 30 U/L (ref 5–34)
Alkaline Phosphatase: 183 U/L — ABNORMAL HIGH (ref 40–150)
BUN: 7.3 mg/dL (ref 7.0–26.0)
CHLORIDE: 104 meq/L (ref 98–109)
CO2: 28 mEq/L (ref 22–29)
Calcium: 9.1 mg/dL (ref 8.4–10.4)
Creatinine: 0.9 mg/dL (ref 0.6–1.1)
EGFR: 86 mL/min/{1.73_m2} — ABNORMAL LOW (ref >90)
Glucose: 261 mg/dl — ABNORMAL HIGH (ref 70–140)
POTASSIUM: 4 meq/L (ref 3.5–5.1)
Sodium: 141 mEq/L (ref 136–145)
TOTAL PROTEIN: 7.3 g/dL (ref 6.4–8.3)
Total Bilirubin: 0.81 mg/dL (ref 0.20–1.20)

## 2014-01-29 LAB — CBC WITH DIFFERENTIAL/PLATELET
BASO%: 1.4 % (ref 0.0–2.0)
Basophils Absolute: 0.1 10*3/uL (ref 0.0–0.1)
EOS%: 1.6 % (ref 0.0–7.0)
Eosinophils Absolute: 0.1 10*3/uL (ref 0.0–0.5)
HEMATOCRIT: 40.8 % (ref 34.8–46.6)
HGB: 13.4 g/dL (ref 11.6–15.9)
LYMPH%: 46.4 % (ref 14.0–49.7)
MCH: 28.1 pg (ref 25.1–34.0)
MCHC: 32.9 g/dL (ref 31.5–36.0)
MCV: 85.5 fL (ref 79.5–101.0)
MONO#: 0.2 10*3/uL (ref 0.1–0.9)
MONO%: 3.9 % (ref 0.0–14.0)
NEUT#: 2.2 10*3/uL (ref 1.5–6.5)
NEUT%: 46.7 % (ref 38.4–76.8)
PLATELETS: 247 10*3/uL (ref 145–400)
RBC: 4.78 10*6/uL (ref 3.70–5.45)
RDW: 13.5 % (ref 11.2–14.5)
WBC: 4.7 10*3/uL (ref 3.9–10.3)
lymph#: 2.2 10*3/uL (ref 0.9–3.3)

## 2014-01-29 NOTE — Therapy (Signed)
[place], [place], [address] Phone: [phone]   Fax:  [phone]  Physical Therapy Treatment  Patient Details  Name: [name] MRN: [id number] Date of Birth: [birth year]  Encounter Date: 01/29/2014      PT End of Session - 01/29/14 1204    Visit Number 32   Date for PT Re-Evaluation 02/13/14   PT Start Time 0846   PT Stop Time 0930   PT Time Calculation (min) 44 min      Past Medical History  Diagnosis Date  . Breast mass in female     fibrocystic changes  . Hyperlipidemia   . Nausea   . Hypertension   . Nausea & vomiting     for approx. 2 weeks  . Cataracts, bilateral   . Diabetes mellitus     insulin dependant - uses pump, Dr. Dwyane Dee  . Breast cancer     T2N2 tiple negative left breast ca   . S/P chemotherapy, time since 4-12 weeks   . History of chemotherapy     taxotere/cytoxan with neulasta support  . Lymphedema of arm     left upper extremity  . Blood clot in vein     nonocclusive clot right subclavian vein, on Xarelto for 6 months  . Allergy   . Breast cancer   . Radiation 08/21/11-10/05/11    Left chestwll/Supraclav./left PAB/left scar    Past Surgical History  Procedure Laterality Date  . Bladder tacking  01/2009  . Shoulder surgery  04/26/10    left  . Tubal ligation  12/1999  . Left mastectomy  02/21/2011  . Portacath placement  02/21/2011    Procedure: INSERTION PORT-A-CATH;  Surgeon: Adin Hector, MD;  Location: [facility];  Service: General;  Laterality: N/A;  . Mastectomy modified radical  02/21/11    left,invasive  multifocal grade III ductal ca,high grade dcis,lymph/vascular invasion invasion (7/14) nodes positive mets  . Port-a-cath removal  10/13/2011    Procedure: MINOR REMOVAL PORT-A-CATH;  Surgeon: Adin Hector, MD;  Location: [facility];  Service: General;  Laterality: Right;  PORT-A CATH REMOVAL  . Mass excision  02/19/2012   Procedure: MINOR EXCISION OF MASS;  Surgeon: Adin Hector, MD;  Location: [facility];  Service: General;  Laterality: Left;  . Abdominal hysterectomy  2002    still has ovaries     There were no vitals taken for this visit.  Visit Diagnosis:  Stiffness of left shoulder joint  Physical deconditioning      Subjective Assessment - 01/29/14 0853    Symptoms "i'm good" Husband accompanies her today and is supportive throughout session. She has had no increased symptoms after exercise session and feels that she wants to continue instruction a few more sessions.  She comes in wearing her sleeve                    [facility] Adult PT Treatment/Exercise - 01/29/14 1102    Lumbar Exercises: Stretches   Active Hamstring Stretch 2 reps   Lower Trunk Rotation 5 reps   Hip Flexor Stretch 2 reps   Quadruped Mid Back Stretch Other (comment)  opposite arm and leg reaches 10 reps   Quad Stretch 2 reps   Piriformis Stretch 2 reps                   Short Term Clinic Goals - 12/18/13 1348    CC  Short Term Goal  #1   Title Left shoulder active abduction 105 degrees.   Time 3   Period Weeks   Status Achieved             Long Term Clinic Goals - 01/29/14 1208    CC Long Term Goal  #1   Title Left shoulder active abduction 120 degrees for improved ADLs.   Time 6   Period Weeks   Status On-going   CC Long Term Goal  #2   Title Left shoulder active flexion 145 degrees for improved overhead reach.   Time 6   Period Weeks   CC Long Term Goal  #3   Title Left shoulder active external rotation at least 70 degrees   Time 6   Period Weeks   Status On-going   CC Long Term Goal  #4   Title verbalize and demonstrate understanding of Strength ABC program   Time 6   Period Weeks   Status On-going            Plan - 01/29/14 1205    Clinical Impression Statement Performed entire strength ABC program with occasional cues, but overall good  biomechanical technique.  She was able to increase weight to 3 # for 2 sets of 10 for chest press, scaption, tricep kickback and bicep curls. Pt did logging on grid with some assist    PT Next Visit Plan Perform sequence of ABC program with 3# weight and pt doing logging.  Review biomechanical technique        Problem List Patient Active Problem List   Diagnosis Date Noted  . Acute upper respiratory infections of unspecified site 04/25/2013  . Unspecified essential hypertension 12/04/2012  . Cough 09/01/2012  . Other and unspecified ovarian cyst 08/12/2012  . Pap smear for cervical cancer screening 08/12/2012  . Diabetic retinopathy 07/12/2012  . Jaw pain 06/26/2012  . Mass of left side of neck 02/06/2012  . Breast carcinoma metastatic, skin 05/08/2011  . Cancer of left breast 01/19/2011  . Patellofemoral syndrome, Right 03/10/2010  . THORACIC SOMATIC DYSFUNCTION 02/15/2010  . SOMATIC DYSFUNCTION, RIB CAGE 02/15/2010  . ROTATOR CUFF INJURY, LEFT SHOULDER 11/09/2009  . HOT FLASHES 06/14/2007  . DM, UNCOMPLICATED, TYPE I, UNCONTROLLED 11/16/2006  . HYPERLIPIDEMIA 11/16/2006  . DEPRESSION, MAJOR 11/16/2006   [name]. [name], PT   01/29/2014, 12:09 PM  [place], [place], [address] Phone: [phone]   Fax:  [phone]

## 2014-02-02 ENCOUNTER — Ambulatory Visit: Payer: Commercial Managed Care - PPO | Attending: Oncology | Admitting: Physical Therapy

## 2014-02-02 DIAGNOSIS — M25612 Stiffness of left shoulder, not elsewhere classified: Secondary | ICD-10-CM | POA: Diagnosis present

## 2014-02-02 DIAGNOSIS — R5381 Other malaise: Secondary | ICD-10-CM

## 2014-02-02 NOTE — Therapy (Signed)
[place], [place], [address] Phone: [phone]   Fax:  [phone]  Physical Therapy Treatment  Patient Details  Name: [name] MRN: [id number] Date of Birth: 29-Feb-1970  Encounter Date: 02/02/2014      PT End of Session - 02/02/14 0931    PT Start Time 0900   PT Stop Time 0932   PT Time Calculation (min) 32 min      Past Medical History  Diagnosis Date  . Breast mass in female     fibrocystic changes  . Hyperlipidemia   . Nausea   . Hypertension   . Nausea & vomiting     for approx. 2 weeks  . Cataracts, bilateral   . Diabetes mellitus     insulin dependant - uses pump, Dr. Dwyane Dee  . Breast cancer     T2N2 tiple negative left breast ca   . S/P chemotherapy, time since 4-12 weeks   . History of chemotherapy     taxotere/cytoxan with neulasta support  . Lymphedema of arm     left upper extremity  . Blood clot in vein     nonocclusive clot right subclavian vein, on Xarelto for 6 months  . Allergy   . Breast cancer   . Radiation 08/21/11-10/05/11    Left chestwll/Supraclav./left PAB/left scar    Past Surgical History  Procedure Laterality Date  . Bladder tacking  01/2009  . Shoulder surgery  04/26/10    left  . Tubal ligation  12/1999  . Left mastectomy  02/21/2011  . Portacath placement  02/21/2011    Procedure: INSERTION PORT-A-CATH;  Surgeon: Adin Hector, MD;  Location: [facility];  Service: General;  Laterality: N/A;  . Mastectomy modified radical  02/21/11    left,invasive  multifocal grade III ductal ca,high grade dcis,lymph/vascular invasion invasion (7/14) nodes positive mets  . Port-a-cath removal  10/13/2011    Procedure: MINOR REMOVAL PORT-A-CATH;  Surgeon: Adin Hector, MD;  Location: [facility];  Service: General;  Laterality: Right;  PORT-A CATH REMOVAL  . Mass excision  02/19/2012    Procedure: MINOR EXCISION OF MASS;  Surgeon: Adin Hector, MD;   Location: [facility];  Service: General;  Laterality: Left;  . Abdominal hysterectomy  2002    still has ovaries     There were no vitals taken for this visit.  Visit Diagnosis:  No diagnosis found.      Subjective Assessment - 02/02/14 0905    Currently in Pain? No/denies                    [facility] Adult PT Treatment/Exercise - 02/02/14 0905    Lumbar Exercises: Stretches   Active Hamstring Stretch 2 reps   Lower Trunk Rotation 5 reps   Hip Flexor Stretch 2 reps   Quadruped Mid Back Stretch Other (comment)  opposite arm and leg reaches 10 reps   Quad Stretch 2 reps   Piriformis Stretch 2 reps   Lumbar Exercises: Sidelying   Clam 10 reps;Other (comment)  on each side   Knee/Hip Exercises: Standing   Heel Raises 10 reps;2 sets   Forward Step Up 2 sets;10 reps;Step Height: 8"   Functional Squat 2 sets;10 reps  3#   Other Standing Knee Exercises hip abduction  10 reps with each leg   Shoulder Exercises: Standing   Other Standing Exercises triceps kickback 2 sets of 10 with 3# weight on  each arm   Shoulder Exercises: ROM/Strengthening   "W" Arms 2 sets of 10 with 3#   Proximal Shoulder Strengthening, Supine chest press 2 sets of 10 with 3 #   Other ROM/Strengthening Exercises scaption with back against wall 2 sets of 10 with 3#   Other ROM/Strengthening Exercises bicep curls with back against wall with 3#    Shoulder Exercises: Stretch   Cross Chest Stretch 2 reps   Wall Stretch - Flexion 2 reps   Wall Stretch - ABduction 2 reps                   Short Term Clinic Goals - 12/18/13 1348    CC Short Term Goal  #1   Title Left shoulder active abduction 105 degrees.   Time 3   Period Weeks   Status Achieved             Long Term Clinic Goals - 01/29/14 1208    CC Long Term Goal  #1   Title Left shoulder active abduction 120 degrees for improved ADLs.   Time 6   Period Weeks   Status On-going   CC Long Term Goal  #2    Title Left shoulder active flexion 145 degrees for improved overhead reach.   Time 6   Period Weeks   CC Long Term Goal  #3   Title Left shoulder active external rotation at least 70 degrees   Time 6   Period Weeks   Status On-going   CC Long Term Goal  #4   Title verbalize and demonstrate understanding of Strength ABC program   Time 6   Period Weeks   Status On-going            Plan - 02/02/14 0903    Clinical Impression Statement pt able to perform stretches independently and follow through with logging for strength program         Problem List Patient Active Problem List   Diagnosis Date Noted  . Acute upper respiratory infections of unspecified site 04/25/2013  . Unspecified essential hypertension 12/04/2012  . Cough 09/01/2012  . Other and unspecified ovarian cyst 08/12/2012  . Pap smear for cervical cancer screening 08/12/2012  . Diabetic retinopathy 07/12/2012  . Jaw pain 06/26/2012  . Mass of left side of neck 02/06/2012  . Breast carcinoma metastatic, skin 05/08/2011  . Cancer of left breast 01/19/2011  . Patellofemoral syndrome, Right 03/10/2010  . THORACIC SOMATIC DYSFUNCTION 02/15/2010  . SOMATIC DYSFUNCTION, RIB CAGE 02/15/2010  . ROTATOR CUFF INJURY, LEFT SHOULDER 11/09/2009  . HOT FLASHES 06/14/2007  . DM, UNCOMPLICATED, TYPE I, UNCONTROLLED 11/16/2006  . HYPERLIPIDEMIA 11/16/2006  . DEPRESSION, MAJOR 11/16/2006   [name]. [name], PT  02/02/2014, 9:32 AM  [place], [place], [address] Phone: [phone]   Fax:  [phone]

## 2014-02-05 ENCOUNTER — Ambulatory Visit: Payer: Commercial Managed Care - PPO | Admitting: Physical Therapy

## 2014-02-05 ENCOUNTER — Ambulatory Visit (HOSPITAL_BASED_OUTPATIENT_CLINIC_OR_DEPARTMENT_OTHER): Payer: Commercial Managed Care - PPO | Admitting: Hematology and Oncology

## 2014-02-05 ENCOUNTER — Telehealth: Payer: Self-pay | Admitting: Hematology and Oncology

## 2014-02-05 VITALS — BP 140/92 | HR 86 | Temp 97.9°F | Resp 18 | Ht 65.0 in | Wt 209.2 lb

## 2014-02-05 DIAGNOSIS — E109 Type 1 diabetes mellitus without complications: Secondary | ICD-10-CM

## 2014-02-05 DIAGNOSIS — R748 Abnormal levels of other serum enzymes: Secondary | ICD-10-CM

## 2014-02-05 DIAGNOSIS — C50912 Malignant neoplasm of unspecified site of left female breast: Secondary | ICD-10-CM

## 2014-02-05 DIAGNOSIS — Z853 Personal history of malignant neoplasm of breast: Secondary | ICD-10-CM

## 2014-02-05 DIAGNOSIS — R5381 Other malaise: Secondary | ICD-10-CM

## 2014-02-05 MED ORDER — VENLAFAXINE HCL 37.5 MG PO TABS: ORAL_TABLET | ORAL | Status: DC

## 2014-02-05 MED ORDER — GABAPENTIN 600 MG PO TABS: 600.0000 mg | ORAL_TABLET | Freq: Three times a day (TID) | ORAL | Status: DC

## 2014-02-05 NOTE — Assessment & Plan Note (Signed)
stage II B (T2 N2 MX) invasive ductal carcinoma of the left breast patient is status post mastectomy for multifocal disease and no positive disease largest tumor measured 2.4 cm. Tumor was ER negative PR negative HER-2/neu negative with a Ki-67 of 78%. Status post adjuvant chemotherapy on NSABP B 49 TC 6 cycles followed by adjuvant radiation and currently on observation  Breast cancer surveillance: 1. Mammogram right breast done December 2015 is normal 2. Breast exam 02/05/2014 is normal 3. Elevated alkaline phosphatase: Patient has had persistently elevated alkaline phosphatase in the past and a bone scan done December 2014 was normal. I would like to repeat CT chest abdomen and pelvis and a bone scan in order to evaluate the symptom. I would like to see her back following this to discuss the results. Patient is a type I diabetic and is awaiting her hemoglobin A1c to decrease below 8 so that she can undergo breast reconstruction. She is going through school for hospitality management.

## 2014-02-05 NOTE — Telephone Encounter (Signed)
, °

## 2014-02-05 NOTE — Therapy (Signed)
[place], [place], [address] Phone: [phone]   Fax:  [phone]  Physical Therapy Treatment  Patient Details  Name: [name] MRN: [id number] Date of Birth: 1971-01-22 Referring Provider:  Leone Brand, MD  Encounter Date: 02/05/2014      PT End of Session - 02/05/14 1236    Visit Number 34   PT Start Time 1010   PT Stop Time 1015   PT Time Calculation (min) 5 min      Past Medical History  Diagnosis Date  . Breast mass in female     fibrocystic changes  . Hyperlipidemia   . Nausea   . Hypertension   . Nausea & vomiting     for approx. 2 weeks  . Cataracts, bilateral   . Diabetes mellitus     insulin dependant - uses pump, Dr. Dwyane Dee  . Breast cancer     T2N2 tiple negative left breast ca   . S/P chemotherapy, time since 4-12 weeks   . History of chemotherapy     taxotere/cytoxan with neulasta support  . Lymphedema of arm     left upper extremity  . Blood clot in vein     nonocclusive clot right subclavian vein, on Xarelto for 6 months  . Allergy   . Breast cancer   . Radiation 08/21/11-10/05/11    Left chestwll/Supraclav./left PAB/left scar    Past Surgical History  Procedure Laterality Date  . Bladder tacking  01/2009  . Shoulder surgery  04/26/10    left  . Tubal ligation  12/1999  . Left mastectomy  02/21/2011  . Portacath placement  02/21/2011    Procedure: INSERTION PORT-A-CATH;  Surgeon: Adin Hector, MD;  Location: [facility];  Service: General;  Laterality: N/A;  . Mastectomy modified radical  02/21/11    left,invasive  multifocal grade III ductal ca,high grade dcis,lymph/vascular invasion invasion (7/14) nodes positive mets  . Port-a-cath removal  10/13/2011    Procedure: MINOR REMOVAL PORT-A-CATH;  Surgeon: Adin Hector, MD;  Location: [facility];  Service: General;  Laterality: Right;  PORT-A CATH REMOVAL  . Mass excision  02/19/2012   Procedure: MINOR EXCISION OF MASS;  Surgeon: Adin Hector, MD;  Location: [facility];  Service: General;  Laterality: Left;  . Abdominal hysterectomy  2002    still has ovaries     There were no vitals taken for this visit.  Visit Diagnosis:  Physical deconditioning      Subjective Assessment - 02/05/14 1233    Symptoms "I thought my appointment was at 10:00"  (appt was at 9:30) Pt reports she is doing exercise program at home       Not enough time for treatment today.  Provided encouragement that patient has learned what she she needs to know  to continue exercise program on her own and she acknowledges she should be ok.  Wants one more visit to reassess for goals and answer final questions.          Short Term Clinic Goals - 12/18/13 1348    CC Short Term Goal  #1   Title Left shoulder active abduction 105 degrees.   Time 3   Period Weeks   Status Achieved             Long Term Clinic Goals - 01/29/14 1208    CC Long Term Goal  #1   Title Left shoulder active abduction  120 degrees for improved ADLs.   Time 6   Period Weeks   Status On-going   CC Long Term Goal  #2   Title Left shoulder active flexion 145 degrees for improved overhead reach.   Time 6   Period Weeks   CC Long Term Goal  #3   Title Left shoulder active external rotation at least 70 degrees   Time 6   Period Weeks   Status On-going   CC Long Term Goal  #4   Title verbalize and demonstrate understanding of Strength ABC program   Time 6   Period Weeks   Status On-going            Plan - 02/05/14 1234    Clinical Impression Statement Not enough time to complete discharge visit. Briefly reviewed how patient will continue to progress exercise on her own.     PT Next Visit Plan Discharge visit.  Assess for goal competion. Answer any questions about progression ABC strengthening.        Problem List Patient Active Problem List   Diagnosis Date Noted  . Acute  upper respiratory infections of unspecified site 04/25/2013  . Unspecified essential hypertension 12/04/2012  . Cough 09/01/2012  . Other and unspecified ovarian cyst 08/12/2012  . Pap smear for cervical cancer screening 08/12/2012  . Diabetic retinopathy 07/12/2012  . Jaw pain 06/26/2012  . Mass of left side of neck 02/06/2012  . Breast carcinoma metastatic, skin 05/08/2011  . Cancer of left breast 01/19/2011  . Patellofemoral syndrome, Right 03/10/2010  . THORACIC SOMATIC DYSFUNCTION 02/15/2010  . SOMATIC DYSFUNCTION, RIB CAGE 02/15/2010  . ROTATOR CUFF INJURY, LEFT SHOULDER 11/09/2009  . HOT FLASHES 06/14/2007  . DM, UNCOMPLICATED, TYPE I, UNCONTROLLED 11/16/2006  . HYPERLIPIDEMIA 11/16/2006  . DEPRESSION, MAJOR 11/16/2006   [name]. [name], PT    02/05/2014, 12:37 PM  [place], [place], [address] Phone: [phone]   Fax:  [phone]

## 2014-02-05 NOTE — Progress Notes (Signed)
Patient Care Team: Leone Brand, MD as PCP - General Linton Rump, MD (Ophthalmology)  DIAGNOSIS: No matching staging information was found for the patient.  SUMMARY OF ONCOLOGIC HISTORY:   Cancer of left breast   01/19/2011 Initial Diagnosis Cancer of left breast   01/27/2011 Procedure Genetic testing was negative for BRCA1 and BRCA2 gene mutations   02/21/2011 Surgery Left modified radical mastectomy with axillary lymph node dissection T2 N2 M0 ER 0%, PR 0%, HER-2 negative ratio 1.41, multifocal grade 3 largest 2.4 cm other foci 1.2, 1, 1, 0.2 cm with high-grade DCIS, and VI, 7/14 lymph nodes positive   03/22/2011 - 07/17/2011 Chemotherapy NSABP B 49 clinical trial randomized to 6 cycles of Taxotere and Cytoxan   08/29/2011 - 10/10/2011 Radiation Therapy Adjuvant radiation therapy    CHIEF COMPLIANT: Follow-up of breast cancer  INTERVAL HISTORY: [name] is a 44 year old lady with above-mentioned history of left-sided breast cancer treated with mastectomy and axillary lymph node dissection followed by 6 cycles of Taxotere and Cytoxan chemotherapy followed by adjuvant radiation therapy. She is currently in observation is here for six-month follow-up. She reports no new problems or concerns. She occasionally has cramping pains in the left armpit. Other than that she is without any problems she had a mammogram done in December on the right breast which was normal. She has been physical therapy for the frozen left shoulder.  REVIEW OF SYSTEMS:   Constitutional: Denies fevers, chills or abnormal weight loss Eyes: Denies blurriness of vision Ears, nose, mouth, throat, and face: Denies mucositis or sore throat Respiratory: Denies cough, dyspnea or wheezes Cardiovascular: Denies palpitation, chest discomfort or lower extremity swelling Gastrointestinal:  Denies nausea, heartburn or change in bowel habits Skin: Denies abnormal skin rashes Lymphatics: Denies new lymphadenopathy or easy  bruising Neurological:Denies numbness, tingling or new weaknesses Behavioral/Psych: Mood is stable, no new changes  Breast: Cramps in the left axilla intermittently All other systems were reviewed with the patient and are negative.  I have reviewed the past medical history, past surgical history, social history and family history with the patient and they are unchanged from previous note.  ALLERGIES:  is allergic to hydrocodone-acetaminophen; insulin glargine; and nph iletin i.  MEDICATIONS:  Current Outpatient Prescriptions  Medication Sig Dispense Refill  . acetaminophen (TYLENOL) 500 MG tablet Take 1 tablet (500 mg total) by mouth every 6 (six) hours as needed for pain. 30 tablet 0  . cetirizine (ZYRTEC) 10 MG tablet Take 1 tablet (10 mg total) by mouth daily. 30 tablet 11  . Chromium Picolinate 500 MCG CAPS Take 500 mcg by mouth daily.    Marland Kitchen gabapentin (NEURONTIN) 600 MG tablet Take 1 tablet (600 mg total) by mouth 3 (three) times daily. 90 tablet 2  . Green Coffee Bean 400 MG CAPS Take 400 mg by mouth 3 (three) times daily.    . Insulin Human (INSULIN PUMP) 100 unit/ml SOLN Inject 1 each into the skin continuous. Novolog insulin    . lisinopril (PRINIVIL,ZESTRIL) 10 MG tablet Take 1 tablet (10 mg total) by mouth every morning. For blood pressure 30 tablet 5  . NOVOLOG 100 UNIT/ML injection USE MAX 80 UNITES PER DAY WITH PUMP. 30 mL 2  . ranitidine (ZANTAC) 150 MG tablet Take 1 tablet (150 mg total) by mouth 2 (two) times daily. 60 tablet 2  . venlafaxine (EFFEXOR) 37.5 MG tablet TAKE ONE TABLET BY MOUTH TWICE DAILY 60 tablet 1  . VITAMIN D, CHOLECALCIFEROL, PO Take 1  tablet by mouth daily.      No current facility-administered medications for this visit.    PHYSICAL EXAMINATION: ECOG PERFORMANCE STATUS: 1 - Symptomatic but completely ambulatory  Filed Vitals:   02/05/14 1407  BP: 140/92  Pulse: 86  Temp: 97.9 F (36.6 C)  Resp: 18   Filed Weights   02/05/14 1407  Weight:  209 lb 3.2 oz (94.892 kg)    GENERAL:alert, no distress and comfortable SKIN: skin color, texture, turgor are normal, no rashes or significant lesions EYES: normal, Conjunctiva are pink and non-injected, sclera clear OROPHARYNX:no exudate, no erythema and lips, buccal mucosa, and tongue normal  NECK: supple, thyroid normal size, non-tender, without nodularity LYMPH:  no palpable lymphadenopathy in the cervical, axillary or inguinal LUNGS: clear to auscultation and percussion with normal breathing effort HEART: regular rate & rhythm and no murmurs and no lower extremity edema ABDOMEN:abdomen soft, non-tender and normal bowel sounds Musculoskeletal:no cyanosis of digits and no clubbing  NEURO: alert & oriented x 3 with fluent speech, no focal motor/sensory deficits BREAST: No palpable masses or nodules in the right breast left chest wall and axilla are normal. No palpable axillary supraclavicular or infraclavicular adenopathy no breast tenderness or nipple discharge.   LABORATORY DATA:  I have reviewed the data as listed   Chemistry      Component Value Date/Time   NA 141 01/29/2014 1249   NA 136 09/01/2013 1352   K 4.0 01/29/2014 1249   K 3.5 09/01/2013 1352   CL 101 09/01/2013 1352   CL 107 07/15/2012 1021   CO2 28 01/29/2014 1249   CO2 29 09/01/2013 1352   BUN 7.3 01/29/2014 1249   BUN 11 09/01/2013 1352   CREATININE 0.9 01/29/2014 1249   CREATININE 0.8 09/01/2013 1352      Component Value Date/Time   CALCIUM 9.1 01/29/2014 1249   CALCIUM 9.4 09/01/2013 1352   ALKPHOS 183* 01/29/2014 1249   ALKPHOS 117 09/01/2013 1352   AST 30 01/29/2014 1249   AST 18 09/01/2013 1352   ALT 38 01/29/2014 1249   ALT 18 09/01/2013 1352   BILITOT 0.81 01/29/2014 1249   BILITOT 0.7 09/01/2013 1352       Lab Results  Component Value Date   WBC 4.7 01/29/2014   HGB 13.4 01/29/2014   HCT 40.8 01/29/2014   MCV 85.5 01/29/2014   PLT 247 01/29/2014   NEUTROABS 2.2 01/29/2014     ASSESSMENT & PLAN:  Cancer of left breast stage II B (T2 N2 MX) invasive ductal carcinoma of the left breast patient is status post mastectomy for multifocal disease and no positive disease largest tumor measured 2.4 cm. Tumor was ER negative PR negative HER-2/neu negative with a Ki-67 of 78%. Status post adjuvant chemotherapy on NSABP B 49 TC 6 cycles followed by adjuvant radiation and currently on observation  Breast cancer surveillance: 1. Mammogram right breast done December 2015 is normal 2. Breast exam 02/05/2014 is normal 3. Elevated alkaline phosphatase: Patient has had persistently elevated alkaline phosphatase in the past and a bone scan done December 2014 was normal. I would like to repeat CT chest abdomen and pelvis and a bone scan in order to evaluate the symptom. I would like to see her back following this to discuss the results. Patient is a type I diabetic and is awaiting her hemoglobin A1c to decrease below 8 so that she can undergo breast reconstruction. She is going through school for hospitality management.   Orders Placed This  Encounter  Procedures  . CT Abdomen Pelvis W Contrast    Standing Status: Future     Number of Occurrences:      Standing Expiration Date: 02/05/2015    Order Specific Question:  Reason for Exam (SYMPTOM  OR DIAGNOSIS REQUIRED)    Answer:  Elevated alkaline phosphatase with breast cancer    Order Specific Question:  Is the patient pregnant?    Answer:  No    Order Specific Question:  Preferred imaging location?    Answer:  [facility]  . CT Chest W Contrast    Standing Status: Future     Number of Occurrences:      Standing Expiration Date: 02/05/2015    Order Specific Question:  Reason for Exam (SYMPTOM  OR DIAGNOSIS REQUIRED)    Answer:  Breast cancer restaging    Order Specific Question:  Is the patient pregnant?    Answer:  No    Order Specific Question:  Preferred imaging location?    Answer:  [place]  Bone Scan Whole Body    Standing Status: Future     Number of Occurrences:      Standing Expiration Date: 02/05/2015    Order Specific Question:  Reason for Exam (SYMPTOM  OR DIAGNOSIS REQUIRED)    Answer:  Elevated alk phosphatase with breast cancer history    Order Specific Question:  Is the patient pregnant?    Answer:  No    Order Specific Question:  Preferred imaging location?    Answer:  [facility]   The patient has a good understanding of the overall plan. she agrees with it. She will call with any problems that may develop before her next visit here.   Rulon Eisenmenger, MD 02/05/2014 2:40 PM

## 2014-02-09 ENCOUNTER — Telehealth: Payer: Self-pay | Admitting: Hematology and Oncology

## 2014-02-09 ENCOUNTER — Ambulatory Visit: Payer: Commercial Managed Care - PPO | Admitting: Physical Therapy

## 2014-02-09 DIAGNOSIS — M25612 Stiffness of left shoulder, not elsewhere classified: Secondary | ICD-10-CM

## 2014-02-09 DIAGNOSIS — R5381 Other malaise: Secondary | ICD-10-CM

## 2014-02-09 NOTE — Telephone Encounter (Signed)
pt req to r/s appt-stated when CT were sch they could not sch until 1/18-she needs to r/s-gave pt updated copy of sch

## 2014-02-09 NOTE — Therapy (Signed)
[place], [place], [address] Phone: [phone]   Fax:  [phone]  Physical Therapy Treatment  Patient Details  Name: [name] MRN: [id number] Date of Birth: 13-May-1970 Referring Provider:  Leone Brand, MD  Encounter Date: 02/09/2014      PT End of Session - 02/09/14 0952    Visit Number 62   PT Start Time 0853   PT Stop Time 0934   PT Time Calculation (min) 41 min   Equipment Utilized During Treatment Other (comment)  4 lb. dumbbells   Activity Tolerance Patient tolerated treatment well   Behavior During Therapy [facility] for tasks assessed/performed      Past Medical History  Diagnosis Date  . Breast mass in female     fibrocystic changes  . Hyperlipidemia   . Nausea   . Hypertension   . Nausea & vomiting     for approx. 2 weeks  . Cataracts, bilateral   . Diabetes mellitus     insulin dependant - uses pump, Dr. Dwyane Dee  . Breast cancer     T2N2 tiple negative left breast ca   . S/P chemotherapy, time since 4-12 weeks   . History of chemotherapy     taxotere/cytoxan with neulasta support  . Lymphedema of arm     left upper extremity  . Blood clot in vein     nonocclusive clot right subclavian vein, on Xarelto for 6 months  . Allergy   . Breast cancer   . Radiation 08/21/11-10/05/11    Left chestwll/Supraclav./left PAB/left scar    Past Surgical History  Procedure Laterality Date  . Bladder tacking  01/2009  . Shoulder surgery  04/26/10    left  . Tubal ligation  12/1999  . Left mastectomy  02/21/2011  . Portacath placement  02/21/2011    Procedure: INSERTION PORT-A-CATH;  Surgeon: Adin Hector, MD;  Location: [place];  Service: General;  Laterality: N/A;  . Mastectomy modified radical  02/21/11    left,invasive  multifocal grade III ductal ca,high grade dcis,lymph/vascular invasion invasion (7/14) nodes positive mets  . Port-a-cath removal  10/13/2011    Procedure: MINOR  REMOVAL PORT-A-CATH;  Surgeon: Adin Hector, MD;  Location: [place];  Service: General;  Laterality: Right;  PORT-A CATH REMOVAL  . Mass excision  02/19/2012    Procedure: MINOR EXCISION OF MASS;  Surgeon: Adin Hector, MD;  Location: [place];  Service: General;  Laterality: Left;  . Abdominal hysterectomy  2002    still has ovaries     There were no vitals taken for this visit.  Visit Diagnosis:  Physical deconditioning  Stiffness of left shoulder joint      Subjective Assessment - 02/09/14 0855    Symptoms "I'm sad.  It's my last day."  Didn't do exercise last week.  My father-in-law is in the hospital in intensive care.   Currently in Pain? No/denies          [facility] PT Assessment - 02/09/14 0001    AROM   Left Shoulder Flexion 130 Degrees   Left Shoulder ABduction 105 Degrees   Left Shoulder Internal Rotation 72 Degrees   Left Shoulder External Rotation 50 Degrees                          PT Education - 02/09/14 0950    Education provided Yes   Education Details  Reviewed entire strength ABC program with patient performing all exercises, though abbreviated with fewer repetitions due to time constraints.  Pt. used 4 lb. dumbbell weight for most.     Person(s) Educated Patient   Methods Handout;Verbal cues   Comprehension Returned demonstration           Short Term Clinic Goals - 02/09/14 [id number]    CC Short Term Goal  #1   Status Achieved             Long Term Clinic Goals - 02/09/14 0908    CC Long Term Goal  #1   Status Not Met   CC Long Term Goal  #2   Status Not Met   CC Long Term Goal  #3   Status Not Met   CC Long Term Goal  #4   Status Achieved            Plan - 02/09/14 0953    Clinical Impression Statement Pt. did well with strength ABC program review and appeared familiar with what she should be doing.  Used good form and expressed understanding of what to do.   Rehab Potential  Good   PT Next Visit Plan None:  discharge today.   PT Home Exercise Plan Continue strength ABC program at home; also encouraged patient to continue left shoulder stretching.   Consulted and Agree with Plan of Care Patient        Problem List Patient Active Problem List   Diagnosis Date Noted  . Acute upper respiratory infections of unspecified site 04/25/2013  . Unspecified essential hypertension 12/04/2012  . Cough 09/01/2012  . Other and unspecified ovarian cyst 08/12/2012  . Pap smear for cervical cancer screening 08/12/2012  . Diabetic retinopathy 07/12/2012  . Jaw pain 06/26/2012  . Mass of left side of neck 02/06/2012  . Breast carcinoma metastatic, skin 05/08/2011  . Cancer of left breast 01/19/2011  . Patellofemoral syndrome, Right 03/10/2010  . THORACIC SOMATIC DYSFUNCTION 02/15/2010  . SOMATIC DYSFUNCTION, RIB CAGE 02/15/2010  . ROTATOR CUFF INJURY, LEFT SHOULDER 11/09/2009  . HOT FLASHES 06/14/2007  . DM, UNCOMPLICATED, TYPE I, UNCONTROLLED 11/16/2006  . HYPERLIPIDEMIA 11/16/2006  . DEPRESSION, MAJOR 11/16/2006    Leeanna Slaby 02/09/2014, 10:02 AM  [place], [place], [address] Phone: [phone]   Fax:  [phone]   PHYSICAL THERAPY DISCHARGE SUMMARY  Visits from Start of Care: 35  Current functional level related to goals / functional outcomes: Long term goals for left UE AROM for flexion, abduction, and external rotation were not fully met, though patient made gains. Goals related to pain reduction, lymphedema risk reduction and learning the strength ABC program were met.   Remaining deficits: Left shoulder AROM limitations as noted above.   Education / Equipment: HEP for left shoulder ROM, strengthening, lymphedema risk reduction. Plan: Patient agrees to discharge.  Patient goals were partially met. Patient is being discharged due to                                                      being able to continue home exercises independently toward remaining long term goals.??    [place], [place]

## 2014-02-16 ENCOUNTER — Ambulatory Visit (HOSPITAL_COMMUNITY): Payer: Commercial Managed Care - PPO

## 2014-02-16 ENCOUNTER — Encounter (HOSPITAL_COMMUNITY)
Admission: RE | Admit: 2014-02-16 | Discharge: 2014-02-16 | Disposition: A | Discharge status: Home / Self Care | Payer: Commercial Managed Care - PPO | Source: Ambulatory Visit | Attending: Hematology and Oncology | Admitting: Hematology and Oncology

## 2014-02-16 ENCOUNTER — Ambulatory Visit: Payer: Commercial Managed Care - PPO | Admitting: Hematology and Oncology

## 2014-02-16 ENCOUNTER — Encounter (HOSPITAL_COMMUNITY): Payer: Self-pay

## 2014-02-16 ENCOUNTER — Ambulatory Visit (HOSPITAL_COMMUNITY)
Admission: RE | Admit: 2014-02-16 | Discharge: 2014-02-16 | Disposition: A | Discharge status: Home / Self Care | Payer: Commercial Managed Care - PPO | Source: Ambulatory Visit | Attending: Hematology and Oncology | Admitting: Hematology and Oncology

## 2014-02-16 DIAGNOSIS — Z9221 Personal history of antineoplastic chemotherapy: Secondary | ICD-10-CM | POA: Diagnosis not present

## 2014-02-16 DIAGNOSIS — Z923 Personal history of irradiation: Secondary | ICD-10-CM | POA: Diagnosis not present

## 2014-02-16 DIAGNOSIS — Z9012 Acquired absence of left breast and nipple: Secondary | ICD-10-CM | POA: Insufficient documentation

## 2014-02-16 DIAGNOSIS — Z9889 Other specified postprocedural states: Secondary | ICD-10-CM | POA: Diagnosis not present

## 2014-02-16 DIAGNOSIS — I10 Essential (primary) hypertension: Secondary | ICD-10-CM | POA: Insufficient documentation

## 2014-02-16 DIAGNOSIS — Z90710 Acquired absence of both cervix and uterus: Secondary | ICD-10-CM | POA: Diagnosis not present

## 2014-02-16 DIAGNOSIS — E119 Type 2 diabetes mellitus without complications: Secondary | ICD-10-CM | POA: Diagnosis not present

## 2014-02-16 DIAGNOSIS — C50912 Malignant neoplasm of unspecified site of left female breast: Secondary | ICD-10-CM

## 2014-02-16 DIAGNOSIS — Z853 Personal history of malignant neoplasm of breast: Secondary | ICD-10-CM | POA: Diagnosis not present

## 2014-02-16 DIAGNOSIS — R748 Abnormal levels of other serum enzymes: Secondary | ICD-10-CM | POA: Diagnosis not present

## 2014-02-16 MED ORDER — TECHNETIUM TC 99M MEDRONATE IV KIT
27.5000 | PACK | Freq: Once | INTRAVENOUS | Status: AC | PRN
  Administered 2014-02-16: 27.5 via INTRAVENOUS

## 2014-02-16 MED ORDER — IOHEXOL 300 MG/ML  SOLN
100.0000 mL | Freq: Once | INTRAMUSCULAR | Status: AC | PRN
  Administered 2014-02-16: 100 mL via INTRAVENOUS

## 2014-02-18 ENCOUNTER — Other Ambulatory Visit: Payer: Self-pay

## 2014-02-18 DIAGNOSIS — C50912 Malignant neoplasm of unspecified site of left female breast: Secondary | ICD-10-CM

## 2014-02-20 ENCOUNTER — Telehealth: Payer: Self-pay | Admitting: Hematology and Oncology

## 2014-02-20 ENCOUNTER — Ambulatory Visit: Payer: Commercial Managed Care - PPO | Admitting: Hematology and Oncology

## 2014-02-20 ENCOUNTER — Other Ambulatory Visit: Payer: Self-pay

## 2014-02-20 NOTE — Telephone Encounter (Signed)
Left message to confirm appointment rescheduled from 1/22 to 1/29

## 2014-02-25 DIAGNOSIS — E10329 Type 1 diabetes mellitus with mild nonproliferative diabetic retinopathy without macular edema: Secondary | ICD-10-CM | POA: Diagnosis not present

## 2014-02-25 DIAGNOSIS — H2513 Age-related nuclear cataract, bilateral: Secondary | ICD-10-CM | POA: Diagnosis not present

## 2014-02-26 ENCOUNTER — Other Ambulatory Visit: Payer: Self-pay | Admitting: Endocrinology

## 2014-02-27 ENCOUNTER — Ambulatory Visit (HOSPITAL_BASED_OUTPATIENT_CLINIC_OR_DEPARTMENT_OTHER): Payer: Commercial Managed Care - PPO | Admitting: Hematology and Oncology

## 2014-02-27 ENCOUNTER — Other Ambulatory Visit: Payer: Self-pay | Admitting: Endocrinology

## 2014-02-27 ENCOUNTER — Telehealth: Payer: Self-pay | Admitting: Hematology and Oncology

## 2014-02-27 VITALS — BP 119/68 | HR 77 | Temp 98.8°F | Resp 18 | Ht 65.0 in | Wt 211.0 lb

## 2014-02-27 DIAGNOSIS — Z853 Personal history of malignant neoplasm of breast: Secondary | ICD-10-CM

## 2014-02-27 DIAGNOSIS — C50912 Malignant neoplasm of unspecified site of left female breast: Secondary | ICD-10-CM

## 2014-02-27 MED ORDER — ERGOCALCIFEROL 1.25 MG (50000 UT) PO CAPS: 50000.0000 [IU] | ORAL_CAPSULE | ORAL | Status: DC

## 2014-02-27 NOTE — Progress Notes (Signed)
Patient Care Team: Leone Brand, MD as PCP - General Linton Rump, MD (Ophthalmology)  DIAGNOSIS: No matching staging information was found for the patient.  SUMMARY OF ONCOLOGIC HISTORY:   Cancer of left breast   01/19/2011 Initial Diagnosis Cancer of left breast   01/27/2011 Procedure Genetic testing was negative for BRCA1 and BRCA2 gene mutations   02/21/2011 Surgery Left modified radical mastectomy with axillary lymph node dissection T2 N2 M0 ER 0%, PR 0%, HER-2 negative ratio 1.41, multifocal grade 3 largest 2.4 cm other foci 1.2, 1, 1, 0.2 cm with high-grade DCIS, and VI, 7/14 lymph nodes positive   03/22/2011 - 07/17/2011 Chemotherapy NSABP B 49 clinical trial randomized to 6 cycles of Taxotere and Cytoxan   08/29/2011 - 10/10/2011 Radiation Therapy Adjuvant radiation therapy   02/12/2014 Imaging CT chest abdomen pelvis and bone scan were normal the test was done to evaluate elevated alkaline phosphatase    CHIEF COMPLIANT: Follow-up after scans  INTERVAL HISTORY: [name] is a 44 year old lady with above-mentioned history of left-sided breast cancer treated with mastectomy and radiation currently on observation. She had elevated alkaline phosphatase and I performed a bone scan and CT chest abdomen pelvis and she is here today to discuss the results. The scans do not show any evidence of metastatic disease. There is no evidence of disease in the liver as well. Otherwise she feels well without any new problems or concerns.  REVIEW OF SYSTEMS:   Constitutional: Denies fevers, chills or abnormal weight loss Eyes: Denies blurriness of vision Ears, nose, mouth, throat, and face: Denies mucositis or sore throat Respiratory: Denies cough, dyspnea or wheezes Cardiovascular: Denies palpitation, chest discomfort or lower extremity swelling Gastrointestinal:  Denies nausea, heartburn or change in bowel habits Skin: Denies abnormal skin rashes Lymphatics: Denies new lymphadenopathy or  easy bruising Neurological:Denies numbness, tingling or new weaknesses Behavioral/Psych: Mood is stable, no new changes  Breast:  denies any pain or lumps or nodules in either breasts All other systems were reviewed with the patient and are negative.  I have reviewed the past medical history, past surgical history, social history and family history with the patient and they are unchanged from previous note.  ALLERGIES:  is allergic to hydrocodone-acetaminophen; insulin glargine; and nph iletin i.  MEDICATIONS:  Current Outpatient Prescriptions  Medication Sig Dispense Refill  . acetaminophen (TYLENOL) 500 MG tablet Take 1 tablet (500 mg total) by mouth every 6 (six) hours as needed for pain. 30 tablet 0  . cetirizine (ZYRTEC) 10 MG tablet Take 1 tablet (10 mg total) by mouth daily. 30 tablet 11  . Chromium Picolinate 500 MCG CAPS Take 500 mcg by mouth daily.    Marland Kitchen gabapentin (NEURONTIN) 600 MG tablet Take 1 tablet (600 mg total) by mouth 3 (three) times daily. 90 tablet 2  . Green Coffee Bean 400 MG CAPS Take 400 mg by mouth 3 (three) times daily.    . Insulin Human (INSULIN PUMP) 100 unit/ml SOLN Inject 1 each into the skin continuous. Novolog insulin    . lisinopril (PRINIVIL,ZESTRIL) 10 MG tablet TAKE ONE TABLET BY MOUTH IN THE MORNING FOR  BLOOD  PRESSURE 30 tablet 0  . NOVOLOG 100 UNIT/ML injection USE MAX 80 UNITS PER DAY WITH PUMP 30 mL 0  . ranitidine (ZANTAC) 150 MG tablet Take 1 tablet (150 mg total) by mouth 2 (two) times daily. 60 tablet 2  . venlafaxine (EFFEXOR) 37.5 MG tablet TAKE ONE TABLET BY MOUTH TWICE DAILY 60  tablet 1  . VITAMIN D, CHOLECALCIFEROL, PO Take 1 tablet by mouth daily.     . ergocalciferol (VITAMIN D2) 50000 UNITS capsule Take 1 capsule (50,000 Units total) by mouth once a week. 12 capsule 3   No current facility-administered medications for this visit.    PHYSICAL EXAMINATION: ECOG PERFORMANCE STATUS: 0 - Asymptomatic  Filed Vitals:   02/27/14 0822   BP: 119/68  Pulse: 77  Temp: 98.8 F (37.1 C)  Resp: 18   Filed Weights   02/27/14 0822  Weight: 211 lb (95.709 kg)    GENERAL:alert, no distress and comfortable SKIN: skin color, texture, turgor are normal, no rashes or significant lesions EYES: normal, Conjunctiva are pink and non-injected, sclera clear OROPHARYNX:no exudate, no erythema and lips, buccal mucosa, and tongue normal  NECK: supple, thyroid normal size, non-tender, without nodularity LYMPH:  no palpable lymphadenopathy in the cervical, axillary or inguinal LUNGS: clear to auscultation and percussion with normal breathing effort HEART: regular rate & rhythm and no murmurs and no lower extremity edema ABDOMEN:abdomen soft, non-tender and normal bowel sounds Musculoskeletal:no cyanosis of digits and no clubbing  NEURO: alert & oriented x 3 with fluent speech, no focal motor/sensory deficits   LABORATORY DATA:  I have reviewed the data as listed   Chemistry      Component Value Date/Time   NA 141 01/29/2014 1249   NA 136 09/01/2013 1352   K 4.0 01/29/2014 1249   K 3.5 09/01/2013 1352   CL 101 09/01/2013 1352   CL 107 07/15/2012 1021   CO2 28 01/29/2014 1249   CO2 29 09/01/2013 1352   BUN 7.3 01/29/2014 1249   BUN 11 09/01/2013 1352   CREATININE 0.9 01/29/2014 1249   CREATININE 0.8 09/01/2013 1352      Component Value Date/Time   CALCIUM 9.1 01/29/2014 1249   CALCIUM 9.4 09/01/2013 1352   ALKPHOS 183* 01/29/2014 1249   ALKPHOS 117 09/01/2013 1352   AST 30 01/29/2014 1249   AST 18 09/01/2013 1352   ALT 38 01/29/2014 1249   ALT 18 09/01/2013 1352   BILITOT 0.81 01/29/2014 1249   BILITOT 0.7 09/01/2013 1352       Lab Results  Component Value Date   WBC 4.7 01/29/2014   HGB 13.4 01/29/2014   HCT 40.8 01/29/2014   MCV 85.5 01/29/2014   PLT 247 01/29/2014   NEUTROABS 2.2 01/29/2014     RADIOGRAPHIC STUDIES: I have personally reviewed the radiology reports and agreed with their findings. Bone  scan and CT chest abdomen pelvis are normal  ASSESSMENT & PLAN:  Cancer of left breast stage II B (T2 N2 MX) invasive ductal carcinoma of the left breast patient is status post mastectomy for multifocal disease and no positive disease largest tumor measured 2.4 cm. Tumor was ER negative PR negative HER-2/neu negative with a Ki-67 of 78%. Status post adjuvant chemotherapy on NSABP B 49 TC 6 cycles followed by adjuvant radiation and currently on observation  Breast cancer surveillance: 1. Mammogram right breast done December 2015 is normal 2. Breast exam 02/05/2014 is normal 3. Elevated alkaline phosphatase: Patient has had persistently elevated alkaline phosphatase in the past and a bone scan done December 2014 was normal. Repeat CT chest abdomen and pelvis and a bone scan were also normal. Unclear etiology. I believe it is related to severe vitamin D deficiency. Previously she had vitamin D levels as low as 10. I recommended that she take 50,000 units of vitamin D to once  a week. I will see her back in 3 months with recheck of her CMP and vitamin D levels. I discussed with her the importance of vitamin D in general health as well as decreasing breast cancer recurrence.  (Patient is a type I diabetic and is awaiting her hemoglobin A1c to decrease below 8 so that she can undergo breast reconstruction. She is going through school for hospitality management.)    Orders Placed This Encounter  Procedures  . Vitamin D 25 hydroxy    Standing Status: Future     Number of Occurrences:      Standing Expiration Date: 02/27/2015  . Comprehensive metabolic panel (Cmet) - [facility]    Standing Status: Future     Number of Occurrences:      Standing Expiration Date: 02/27/2015  . CBC with Differential    Standing Status: Future     Number of Occurrences:      Standing Expiration Date: 02/27/2015   The patient has a good understanding of the overall plan. she agrees with it. She will call with any problems  that may develop before her next visit here.   Rulon Eisenmenger, MD

## 2014-02-27 NOTE — Telephone Encounter (Signed)
per pof to sch pt appt-gave pt copy of sch °

## 2014-02-27 NOTE — Assessment & Plan Note (Addendum)
stage II B (T2 N2 MX) invasive ductal carcinoma of the left breast patient is status post mastectomy for multifocal disease and no positive disease largest tumor measured 2.4 cm. Tumor was ER negative PR negative HER-2/neu negative with a Ki-67 of 78%. Status post adjuvant chemotherapy on NSABP B 49 TC 6 cycles followed by adjuvant radiation and currently on observation  Breast cancer surveillance: 1. Mammogram right breast done December 2015 is normal 2. Breast exam 02/05/2014 is normal 3. Elevated alkaline phosphatase: Patient has had persistently elevated alkaline phosphatase in the past and a bone scan done December 2014 was normal. Repeat CT chest abdomen and pelvis and a bone scan were also normal. Unclear etiology. I believe it is related to severe vitamin D deficiency. Previously she had vitamin D levels as low as 10. I recommended that she take 50,000 units of vitamin D to once a week. I will see her back in 3 months with recheck of her CMP and vitamin D levels. I discussed with her the importance of vitamin D in general health as well as decreasing breast cancer recurrence.  (Patient is a type I diabetic and is awaiting her hemoglobin A1c to decrease below 8 so that she can undergo breast reconstruction. She is going through school for hospitality management.)

## 2014-03-06 ENCOUNTER — Other Ambulatory Visit: Payer: Self-pay | Admitting: *Deleted

## 2014-03-06 DIAGNOSIS — Z886 Allergy status to analgesic agent status: Secondary | ICD-10-CM | POA: Diagnosis not present

## 2014-03-06 DIAGNOSIS — Z888 Allergy status to other drugs, medicaments and biological substances status: Secondary | ICD-10-CM | POA: Diagnosis not present

## 2014-03-06 DIAGNOSIS — R109 Unspecified abdominal pain: Secondary | ICD-10-CM | POA: Diagnosis not present

## 2014-04-17 ENCOUNTER — Encounter: Payer: Self-pay | Admitting: Family Medicine

## 2014-04-17 ENCOUNTER — Other Ambulatory Visit: Payer: Self-pay | Admitting: Family Medicine

## 2014-04-17 ENCOUNTER — Ambulatory Visit (INDEPENDENT_AMBULATORY_CARE_PROVIDER_SITE_OTHER): Payer: Commercial Managed Care - PPO | Admitting: Family Medicine

## 2014-04-17 VITALS — BP 134/88 | HR 70 | Temp 98.1°F | Ht 65.0 in | Wt 214.1 lb

## 2014-04-17 DIAGNOSIS — E1065 Type 1 diabetes mellitus with hyperglycemia: Secondary | ICD-10-CM

## 2014-04-17 DIAGNOSIS — E138 Other specified diabetes mellitus with unspecified complications: Secondary | ICD-10-CM

## 2014-04-17 DIAGNOSIS — E1069 Type 1 diabetes mellitus with other specified complication: Secondary | ICD-10-CM

## 2014-04-17 DIAGNOSIS — IMO0002 Reserved for concepts with insufficient information to code with codable children: Secondary | ICD-10-CM

## 2014-04-17 DIAGNOSIS — I10 Essential (primary) hypertension: Secondary | ICD-10-CM

## 2014-04-17 DIAGNOSIS — E108 Type 1 diabetes mellitus with unspecified complications: Secondary | ICD-10-CM

## 2014-04-17 LAB — POCT GLYCOSYLATED HEMOGLOBIN (HGB A1C): Hemoglobin A1C: 9.6

## 2014-04-17 MED ORDER — GLUCOSE BLOOD VI STRP
ORAL_STRIP | Status: DC
Start: 2014-04-17 — End: 2014-04-17

## 2014-04-17 MED ORDER — GLUCOSE BLOOD VI STRP
ORAL_STRIP | Status: DC
Start: 2014-04-17 — End: 2014-11-30

## 2014-04-17 MED ORDER — LISINOPRIL 10 MG PO TABS: ORAL_TABLET | ORAL | Status: AC

## 2014-04-17 MED ORDER — INSULIN ASPART 100 UNIT/ML ~~LOC~~ SOLN: SUBCUTANEOUS | Status: DC

## 2014-04-17 NOTE — [facility] Notes (Signed)
   Subjective:    Patient ID: [name], female    DOB: 1970-04-15, 44 y.o.   MRN: [id number]  HPI  CC: follow up  # Diabetes (Type 1):  On insulin pump and followed by endocrinologist  Supplies out of: test strips, 1 reservoir of insulin left  Device Monitor: Onetouch  CBG check: yesterday AM 386  Opthalmologist: thinks last went in December ROS: no headaches, no dizziness, no CP, no SOB  # Hypertension  On lisinopril  Missed doses "all the time". Taking about 50% of time ROS: as above  Review of Systems   See HPI for ROS. All other systems reviewed and are negative.  Past medical history, surgical, family, and social history reviewed and updated in the EMR as appropriate. Objective:  BP 134/88 mmHg  Pulse 70  Temp(Src) 98.1 F (36.7 C) (Oral)  Ht 5\' 5"  (1.651 m)  Wt 214 lb 1 oz (97.098 kg)  BMI 35.62 kg/m2 Vitals and nursing note reviewed  General: NAD CV: RRR, normal heart sounds no murmur appreciated Resp: CTAB normal effort Ext: no edema or cyanosis  Diabetic foot exam done, detailed under quality metrics tab. Decreased sensation 2nd MTP/forefoot, though sensation not completely gone.  Assessment & Plan:  See Problem List Documentation

## 2014-04-18 NOTE — Assessment & Plan Note (Signed)
At goal today. Continue lisinopril 10mg .

## 2014-04-18 NOTE — Assessment & Plan Note (Signed)
Followed by endocrinology but may want to change. On insulin pump. A1c worsened compared to prior. Pt has been conserving insulin and not using boluses with the pump, just basal dosing. Refills sent for test strips, insulin. F/u 3 months.

## 2014-05-07 DIAGNOSIS — S83241D Other tear of medial meniscus, current injury, right knee, subsequent encounter: Secondary | ICD-10-CM | POA: Diagnosis not present

## 2014-05-22 ENCOUNTER — Other Ambulatory Visit (HOSPITAL_BASED_OUTPATIENT_CLINIC_OR_DEPARTMENT_OTHER): Payer: Commercial Managed Care - PPO

## 2014-05-22 DIAGNOSIS — C50912 Malignant neoplasm of unspecified site of left female breast: Secondary | ICD-10-CM

## 2014-05-22 DIAGNOSIS — Z853 Personal history of malignant neoplasm of breast: Secondary | ICD-10-CM | POA: Diagnosis not present

## 2014-05-22 LAB — CBC WITH DIFFERENTIAL/PLATELET
BASO%: 0.2 % (ref 0.0–2.0)
BASOS ABS: 0 10*3/uL (ref 0.0–0.1)
EOS ABS: 0.1 10*3/uL (ref 0.0–0.5)
EOS%: 1.9 % (ref 0.0–7.0)
HEMATOCRIT: 39.2 % (ref 34.8–46.6)
HGB: 13.5 g/dL (ref 11.6–15.9)
LYMPH%: 47.7 % (ref 14.0–49.7)
MCH: 28.9 pg (ref 25.1–34.0)
MCHC: 34.5 g/dL (ref 31.5–36.0)
MCV: 83.9 fL (ref 79.5–101.0)
MONO#: 0.2 10*3/uL (ref 0.1–0.9)
MONO%: 5.3 % (ref 0.0–14.0)
NEUT%: 44.9 % (ref 38.4–76.8)
NEUTROS ABS: 1.9 10*3/uL (ref 1.5–6.5)
PLATELETS: 226 10*3/uL (ref 145–400)
RBC: 4.66 10*6/uL (ref 3.70–5.45)
RDW: 13.4 % (ref 11.2–14.5)
WBC: 4.1 10*3/uL (ref 3.9–10.3)
lymph#: 2 10*3/uL (ref 0.9–3.3)

## 2014-05-22 LAB — COMPREHENSIVE METABOLIC PANEL (CC13)
ALK PHOS: 137 U/L (ref 40–150)
ALT: 28 U/L (ref 0–55)
AST: 30 U/L (ref 5–34)
Albumin: 3.5 g/dL (ref 3.5–5.0)
Anion Gap: 10 mEq/L (ref 3–11)
BUN: 10.7 mg/dL (ref 7.0–26.0)
CHLORIDE: 107 meq/L (ref 98–109)
CO2: 23 meq/L (ref 22–29)
Calcium: 8.9 mg/dL (ref 8.4–10.4)
Creatinine: 0.8 mg/dL (ref 0.6–1.1)
Glucose: 140 mg/dl (ref 70–140)
Potassium: 3.5 mEq/L (ref 3.5–5.1)
Sodium: 140 mEq/L (ref 136–145)
Total Bilirubin: 0.54 mg/dL (ref 0.20–1.20)
Total Protein: 7 g/dL (ref 6.4–8.3)

## 2014-05-22 LAB — VITAMIN D 25 HYDROXY (VIT D DEFICIENCY, FRACTURES): Vit D, 25-Hydroxy: 20 ng/mL — ABNORMAL LOW (ref 30–100)

## 2014-05-29 ENCOUNTER — Telehealth: Payer: Self-pay | Admitting: Hematology and Oncology

## 2014-05-29 ENCOUNTER — Ambulatory Visit (HOSPITAL_BASED_OUTPATIENT_CLINIC_OR_DEPARTMENT_OTHER): Payer: Commercial Managed Care - PPO | Admitting: Hematology and Oncology

## 2014-05-29 VITALS — BP 141/90 | HR 95 | Temp 98.3°F | Resp 18 | Ht 65.0 in | Wt 214.3 lb

## 2014-05-29 DIAGNOSIS — Z853 Personal history of malignant neoplasm of breast: Secondary | ICD-10-CM

## 2014-05-29 DIAGNOSIS — C50912 Malignant neoplasm of unspecified site of left female breast: Secondary | ICD-10-CM

## 2014-05-29 DIAGNOSIS — R748 Abnormal levels of other serum enzymes: Secondary | ICD-10-CM | POA: Diagnosis not present

## 2014-05-29 DIAGNOSIS — E119 Type 2 diabetes mellitus without complications: Secondary | ICD-10-CM

## 2014-05-29 DIAGNOSIS — E559 Vitamin D deficiency, unspecified: Secondary | ICD-10-CM

## 2014-05-29 NOTE — [facility] Notes (Signed)
Patient Care Team: Leone Brand, MD as PCP - General Calvert Cantor, MD (Ophthalmology)  DIAGNOSIS: No matching staging information was found for the patient.  SUMMARY OF ONCOLOGIC HISTORY:   Cancer of left breast   01/19/2011 Initial Diagnosis Cancer of left breast   01/27/2011 Procedure Genetic testing was negative for BRCA1 and BRCA2 gene mutations   02/21/2011 Surgery Left modified radical mastectomy with axillary lymph node dissection T2 N2 M0 ER 0%, PR 0%, HER-2 negative ratio 1.41, multifocal grade 3 largest 2.4 cm other foci 1.2, 1, 1, 0.2 cm with high-grade DCIS, and VI, 7/14 lymph nodes positive   03/22/2011 - 07/17/2011 Chemotherapy NSABP B 49 clinical trial randomized to 6 cycles of Taxotere and Cytoxan   08/29/2011 - 10/10/2011 Radiation Therapy Adjuvant radiation therapy   02/12/2014 Imaging CT chest abdomen pelvis and bone scan were normal the test was done to evaluate elevated alkaline phosphatase    CHIEF COMPLIANT: Follow-up after vitamin D supplementation was started  INTERVAL HISTORY: [name] is a 44 year old African-American lady with above-mentioned history of left breast cancer who had elevation of alkaline phosphatase, and we diagnosed her with vitamin D deficiency and she started vitamin D replacement 50,000 once a week in January 2016. She reports that she feels remarkably better since she has been on vitamin D supplementation. She is also excited about an upcoming trip to [place] in the next couple weeks.  REVIEW OF SYSTEMS:   Constitutional: Denies fevers, chills or abnormal weight loss Eyes: Denies blurriness of vision Ears, nose, mouth, throat, and face: Denies mucositis or sore throat Respiratory: Denies cough, dyspnea or wheezes Cardiovascular: Denies palpitation, chest discomfort or lower extremity swelling Gastrointestinal:  Denies nausea, heartburn or change in bowel habits Skin: Denies abnormal skin rashes Lymphatics: Denies new lymphadenopathy or  easy bruising Neurological:Denies numbness, tingling or new weaknesses Behavioral/Psych: Mood is stable, no new changes  Breast:  denies any pain or lumps or nodules in either breasts All other systems were reviewed with the patient and are negative.  I have reviewed the past medical history, past surgical history, social history and family history with the patient and they are unchanged from previous note.  ALLERGIES:  is allergic to hydrocodone-acetaminophen; insulin glargine; and nph iletin i.  MEDICATIONS:  Current Outpatient Prescriptions  Medication Sig Dispense Refill  . acetaminophen (TYLENOL) 500 MG tablet Take 1 tablet (500 mg total) by mouth every 6 (six) hours as needed for pain. 30 tablet 0  . cetirizine (ZYRTEC) 10 MG tablet Take 1 tablet (10 mg total) by mouth daily. 30 tablet 11  . Chromium Picolinate 500 MCG CAPS Take 500 mcg by mouth daily.    . ergocalciferol (VITAMIN D2) 50000 UNITS capsule Take 1 capsule (50,000 Units total) by mouth once a week. 12 capsule 3  . gabapentin (NEURONTIN) 600 MG tablet Take 1 tablet (600 mg total) by mouth 3 (three) times daily. 90 tablet 2  . glucose blood (ONE TOUCH ULTRA TEST) test strip Check blood sugar 3 times a day. 100 each 11  . Green Coffee Bean 400 MG CAPS Take 400 mg by mouth 3 (three) times daily.    . insulin aspart (NOVOLOG) 100 UNIT/ML injection USE MAX 80 UNITS PER DAY WITH PUMP 30 mL 1  . Insulin Human (INSULIN PUMP) 100 unit/ml SOLN Inject 1 each into the skin continuous. Novolog insulin    . lisinopril (PRINIVIL,ZESTRIL) 10 MG tablet TAKE ONE TABLET BY MOUTH IN THE MORNING FOR  BLOOD  PRESSURE 90 tablet 3  . ranitidine (ZANTAC) 150 MG tablet Take 1 tablet (150 mg total) by mouth 2 (two) times daily. 60 tablet 2  . venlafaxine (EFFEXOR) 37.5 MG tablet TAKE ONE TABLET BY MOUTH TWICE DAILY 60 tablet 1  . VITAMIN D, CHOLECALCIFEROL, PO Take 1 tablet by mouth daily.      No current facility-administered medications for this  visit.    PHYSICAL EXAMINATION: ECOG PERFORMANCE STATUS: 0 - Asymptomatic  Filed Vitals:   05/29/14 0825  BP: 141/90  Pulse: 95  Temp: 98.3 F (36.8 C)  Resp: 18   Filed Weights   05/29/14 0825  Weight: 214 lb 4.8 oz (97.206 kg)    GENERAL:alert, no distress and comfortable SKIN: skin color, texture, turgor are normal, no rashes or significant lesions EYES: normal, Conjunctiva are pink and non-injected, sclera clear OROPHARYNX:no exudate, no erythema and lips, buccal mucosa, and tongue normal  NECK: supple, thyroid normal size, non-tender, without nodularity LYMPH:  no palpable lymphadenopathy in the cervical, axillary or inguinal LUNGS: clear to auscultation and percussion with normal breathing effort HEART: regular rate & rhythm and no murmurs and no lower extremity edema ABDOMEN:abdomen soft, non-tender and normal bowel sounds Musculoskeletal:no cyanosis of digits and no clubbing  NEURO: alert & oriented x 3 with fluent speech, no focal motor/sensory deficits  LABORATORY DATA:  I have reviewed the data as listed   Chemistry      Component Value Date/Time   NA 140 05/22/2014 0758   NA 136 09/01/2013 1352   K 3.5 05/22/2014 0758   K 3.5 09/01/2013 1352   CL 101 09/01/2013 1352   CL 107 07/15/2012 1021   CO2 23 05/22/2014 0758   CO2 29 09/01/2013 1352   BUN 10.7 05/22/2014 0758   BUN 11 09/01/2013 1352   CREATININE 0.8 05/22/2014 0758   CREATININE 0.8 09/01/2013 1352      Component Value Date/Time   CALCIUM 8.9 05/22/2014 0758   CALCIUM 9.4 09/01/2013 1352   ALKPHOS 137 05/22/2014 0758   ALKPHOS 117 09/01/2013 1352   AST 30 05/22/2014 0758   AST 18 09/01/2013 1352   ALT 28 05/22/2014 0758   ALT 18 09/01/2013 1352   BILITOT 0.54 05/22/2014 0758   BILITOT 0.7 09/01/2013 1352       Lab Results  Component Value Date   WBC 4.1 05/22/2014   HGB 13.5 05/22/2014   HCT 39.2 05/22/2014   MCV 83.9 05/22/2014   PLT 226 05/22/2014   NEUTROABS 1.9 05/22/2014    ASSESSMENT & PLAN:  Cancer of left breast Stage II B (T2 N2 MX) invasive ductal carcinoma of the left breast patient is status post mastectomy for multifocal disease and no positive disease largest tumor measured 2.4 cm. Tumor was ER negative PR negative HER-2/neu negative with a Ki-67 of 78%. Status post adjuvant chemotherapy on NSABP B 49 TC 6 cycles followed by adjuvant radiation and currently on observation  Breast cancer surveillance: 1. Mammogram right breast done December 2015 is normal 2. Breast exam 02/05/2014 is normal 3. Elevated alkaline phosphatase: CT chest abdomen and pelvis and a bone scan were normal 02/16/2014. Unclear etiology. I believe it is related to severe vitamin D deficiency. Previously she had vitamin D levels as low as 10. We started her on 50,000 units of vitamin D once a week and patient feels remarkably well since she has been on vitamin D supplementation. Vitamin D levels were still low at 20. We will recheck this again  in 6 months. Her alkaline phosphatase levels have normalized.  (Patient is a type I diabetic and is awaiting her hemoglobin A1c to decrease below 8 so that she can undergo breast reconstruction. She is going through school for hospitality management.) Patient is going to [place] for 10 days on admission troponin is very excited about it.  Return to clinic in 6 months for follow-up    Orders Placed This Encounter  Procedures  . CBC with Differential    Standing Status: Future     Number of Occurrences:      Standing Expiration Date: 05/29/2015  . Comprehensive metabolic panel (Cmet) - [facility]    Standing Status: Future     Number of Occurrences:      Standing Expiration Date: 05/29/2015  . Vitamin D 25 hydroxy    Standing Status: Future     Number of Occurrences:      Standing Expiration Date: 05/29/2015   The patient has a good understanding of the overall plan. she agrees with it. She will call with any problems that may develop before her  next visit here.   Rulon Eisenmenger, MD

## 2014-05-29 NOTE — Assessment & Plan Note (Addendum)
Stage II B (T2 N2 MX) invasive ductal carcinoma of the left breast patient is status post mastectomy for multifocal disease and no positive disease largest tumor measured 2.4 cm. Tumor was ER negative PR negative HER-2/neu negative with a Ki-67 of 78%. Status post adjuvant chemotherapy on NSABP B 49 TC 6 cycles followed by adjuvant radiation and currently on observation  Breast cancer surveillance: 1. Mammogram right breast done December 2015 is normal 2. Breast exam 02/05/2014 is normal 3. Elevated alkaline phosphatase: CT chest abdomen and pelvis and a bone scan were normal 02/16/2014. Unclear etiology. I believe it is related to severe vitamin D deficiency. Previously she had vitamin D levels as low as 10. We started her on 50,000 units of vitamin D once a week and patient feels remarkably well since she has been on vitamin D supplementation. Vitamin D levels were still low at 20. We will recheck this again in 6 months. Her alkaline phosphatase levels have normalized.  (Patient is a type I diabetic and is awaiting her hemoglobin A1c to decrease below 8 so that she can undergo breast reconstruction. She is going through school for hospitality management.) Patient is going to [place] for 10 days on admission troponin is very excited about it.  Return to clinic in 6 months for follow-up

## 2014-05-29 NOTE — Telephone Encounter (Signed)
per pof to sch pt appt-gave pt copy of sch °

## 2014-06-24 DIAGNOSIS — S83001A Unspecified subluxation of right patella, initial encounter: Secondary | ICD-10-CM | POA: Diagnosis not present

## 2014-06-24 DIAGNOSIS — X58XXXA Exposure to other specified factors, initial encounter: Secondary | ICD-10-CM | POA: Diagnosis not present

## 2014-06-24 DIAGNOSIS — M659 Synovitis and tenosynovitis, unspecified: Secondary | ICD-10-CM | POA: Diagnosis not present

## 2014-06-24 DIAGNOSIS — M94261 Chondromalacia, right knee: Secondary | ICD-10-CM | POA: Diagnosis not present

## 2014-06-24 DIAGNOSIS — S83281A Other tear of lateral meniscus, current injury, right knee, initial encounter: Secondary | ICD-10-CM | POA: Diagnosis not present

## 2014-06-24 DIAGNOSIS — Y929 Unspecified place or not applicable: Secondary | ICD-10-CM | POA: Diagnosis not present

## 2014-06-24 DIAGNOSIS — M25361 Other instability, right knee: Secondary | ICD-10-CM | POA: Diagnosis not present

## 2014-07-13 ENCOUNTER — Other Ambulatory Visit: Payer: Self-pay | Admitting: *Deleted

## 2014-07-13 DIAGNOSIS — E138 Other specified diabetes mellitus with unspecified complications: Secondary | ICD-10-CM

## 2014-07-13 MED ORDER — INSULIN ASPART 100 UNIT/ML ~~LOC~~ SOLN: SUBCUTANEOUS | Status: DC

## 2014-09-10 ENCOUNTER — Emergency Department (HOSPITAL_COMMUNITY): Payer: Commercial Managed Care - PPO

## 2014-09-10 ENCOUNTER — Encounter (HOSPITAL_COMMUNITY): Payer: Self-pay | Admitting: *Deleted

## 2014-09-10 ENCOUNTER — Emergency Department (HOSPITAL_COMMUNITY)
Admission: EM | Admit: 2014-09-10 | Discharge: 2014-09-10 | Disposition: A | Discharge status: Home / Self Care | Payer: Commercial Managed Care - PPO | Attending: Emergency Medicine | Admitting: Emergency Medicine

## 2014-09-10 DIAGNOSIS — Z794 Long term (current) use of insulin: Secondary | ICD-10-CM | POA: Insufficient documentation

## 2014-09-10 DIAGNOSIS — H269 Unspecified cataract: Secondary | ICD-10-CM | POA: Insufficient documentation

## 2014-09-10 DIAGNOSIS — Z853 Personal history of malignant neoplasm of breast: Secondary | ICD-10-CM | POA: Diagnosis not present

## 2014-09-10 DIAGNOSIS — Z79899 Other long term (current) drug therapy: Secondary | ICD-10-CM | POA: Diagnosis not present

## 2014-09-10 DIAGNOSIS — E785 Hyperlipidemia, unspecified: Secondary | ICD-10-CM | POA: Diagnosis not present

## 2014-09-10 DIAGNOSIS — E119 Type 2 diabetes mellitus without complications: Secondary | ICD-10-CM | POA: Insufficient documentation

## 2014-09-10 DIAGNOSIS — M79671 Pain in right foot: Secondary | ICD-10-CM | POA: Diagnosis not present

## 2014-09-10 DIAGNOSIS — I10 Essential (primary) hypertension: Secondary | ICD-10-CM | POA: Diagnosis not present

## 2014-09-10 NOTE — Discharge Instructions (Signed)
Take gabapentin as discussed Musculoskeletal Pain Musculoskeletal pain is muscle and boney aches and pains. These pains can occur in any part of the body. Your caregiver may treat you without knowing the cause of the pain. They may treat you if blood or urine tests, X-rays, and other tests were normal.  CAUSES There is often not a definite cause or reason for these pains. These pains may be caused by a type of germ (virus). The discomfort may also come from overuse. Overuse includes working out too hard when your body is not fit. Boney aches also come from weather changes. Bone is sensitive to atmospheric pressure changes. HOME CARE INSTRUCTIONS   Ask when your test results will be ready. Make sure you get your test results.  Only take over-the-counter or prescription medicines for pain, discomfort, or fever as directed by your caregiver. If you were given medications for your condition, do not drive, operate machinery or power tools, or sign legal documents for 24 hours. Do not drink alcohol. Do not take sleeping pills or other medications that may interfere with treatment.  Continue all activities unless the activities cause more pain. When the pain lessens, slowly resume normal activities. Gradually increase the intensity and duration of the activities or exercise.  During periods of severe pain, bed rest may be helpful. Lay or sit in any position that is comfortable.  Putting ice on the injured area.  Put ice in a bag.  Place a towel between your skin and the bag.  Leave the ice on for 15 to 20 minutes, 3 to 4 times a day.  Follow up with your caregiver for continued problems and no reason can be found for the pain. If the pain becomes worse or does not go away, it may be necessary to repeat tests or do additional testing. Your caregiver may need to look further for a possible cause. SEEK IMMEDIATE MEDICAL CARE IF:  You have pain that is getting worse and is not relieved by  medications.  You develop chest pain that is associated with shortness or breath, sweating, feeling sick to your stomach (nauseous), or throw up (vomit).  Your pain becomes localized to the abdomen.  You develop any new symptoms that seem different or that concern you. MAKE SURE YOU:   Understand these instructions.  Will watch your condition.  Will get help right away if you are not doing well or get worse. Document Released: 01/16/2005 Document Revised: 04/10/2011 Document Reviewed: 09/20/2012 [facility] Patient Information 2015 [place], [place]. This information is not intended to replace advice given to you by your health care provider. Make sure you discuss any questions you have with your health care provider.

## 2014-09-10 NOTE — ED Notes (Signed)
Patient presents stating she opened the back of her [name] and a table fell out hitting the top of her right foot

## 2014-09-10 NOTE — ED Provider Notes (Signed)
CSN: [id number]     Arrival date & time 09/10/14  2101 History  This chart was scribed for non-physician practitioner, Glendell Docker, NP working with Leo Grosser, MD, by Erling Conte, ED Scribe. This patient was seen in room TR05C/TR05C and the patient's care was started at 9:21 PM.    Chief Complaint  Patient presents with  . Foot Injury    The history is provided by the patient. No language interpreter was used.    HPI Comments: [name] is a 44 y.o. female with a h/o type I DM and HTN who presents to the Emergency Department complaining of a foot injury onset 8 hours ago. Pt states she opened the back of her Lucianne Lei and table fell and landed on the top of her right foot. She reports associated sharp, shooting, pain and swelling to the top of her right foot. She states the pain is exacerbated by movement and ambulation. She has not taken anything for the pain. She denies any numbness, tingling or weakness.     Past Medical History  Diagnosis Date  . Breast mass in female     fibrocystic changes  . Hyperlipidemia   . Nausea   . Hypertension   . Nausea & vomiting     for approx. 2 weeks  . Cataracts, bilateral   . Diabetes mellitus     insulin dependant - uses pump, Dr. Dwyane Dee  . Breast cancer     T2N2 tiple negative left breast ca   . S/P chemotherapy, time since 4-12 weeks   . History of chemotherapy     taxotere/cytoxan with neulasta support  . Lymphedema of arm     left upper extremity  . Blood clot in vein     nonocclusive clot right subclavian vein, on Xarelto for 6 months  . Allergy   . Breast cancer   . Radiation 08/21/11-10/05/11    Left chestwll/Supraclav./left PAB/left scar   Past Surgical History  Procedure Laterality Date  . Bladder tacking  01/2009  . Shoulder surgery  04/26/10    left  . Tubal ligation  12/1999  . Left mastectomy  02/21/2011  . Portacath placement  02/21/2011    Procedure: INSERTION PORT-A-CATH;  Surgeon: Adin Hector, MD;   Location: [facility];  Service: General;  Laterality: N/A;  . Mastectomy modified radical  02/21/11    left,invasive  multifocal grade III ductal ca,high grade dcis,lymph/vascular invasion invasion (7/14) nodes positive mets  . Port-a-cath removal  10/13/2011    Procedure: MINOR REMOVAL PORT-A-CATH;  Surgeon: Adin Hector, MD;  Location: [place];  Service: General;  Laterality: Right;  PORT-A CATH REMOVAL  . Mass excision  02/19/2012    Procedure: MINOR EXCISION OF MASS;  Surgeon: Adin Hector, MD;  Location: [place];  Service: General;  Laterality: Left;  . Abdominal hysterectomy  2002    still has ovaries    Family History  Problem Relation Age of Onset  . Hypertension    . Diabetes    . Deep vein thrombosis    . Kidney disease    . Cancer Maternal Aunt     breast  . Cancer Maternal Uncle 50     right eye removed s/p cancer  . Cancer Maternal Grandfather     prostate ca   Social History  Substance Use Topics  . Smoking status: Never Smoker   . Smokeless tobacco: Never Used  . Alcohol Use: No  OB History    Obstetric Comments   G4P3, menarche age 9, and menopause at time of hysterectomy; no hx of hormone replacement therapy     Review of Systems  Musculoskeletal: Positive for myalgias, joint swelling and arthralgias.  Neurological: Negative for weakness and numbness.  All other systems reviewed and are negative.     Allergies  Hydrocodone-acetaminophen; Insulin glargine; and Nph iletin i  Home Medications   Prior to Admission medications   Medication Sig Start Date End Date Taking? Authorizing Provider  acetaminophen (TYLENOL) 500 MG tablet Take 1 tablet (500 mg total) by mouth every 6 (six) hours as needed for pain. 08/19/12   Leone Brand, MD  cetirizine (ZYRTEC) 10 MG tablet Take 1 tablet (10 mg total) by mouth daily. 08/30/12   Kandis Nab, MD  Chromium Picolinate 500 MCG CAPS Take 500 mcg by mouth daily.    Historical  Provider, MD  ergocalciferol (VITAMIN D2) 50000 UNITS capsule Take 1 capsule (50,000 Units total) by mouth once a week. 02/27/14   Nicholas Lose, MD  gabapentin (NEURONTIN) 600 MG tablet Take 1 tablet (600 mg total) by mouth 3 (three) times daily. 02/05/14   Nicholas Lose, MD  glucose blood (ONE TOUCH ULTRA TEST) test strip Check blood sugar 3 times a day. 04/17/14   Lupita Dawn, MD  Nyoka Cowden Coffee Bean 400 MG CAPS Take 400 mg by mouth 3 (three) times daily.    Historical Provider, MD  insulin aspart (NOVOLOG) 100 UNIT/ML injection USE MAX 80 UNITS PER DAY WITH PUMP 07/13/14   Elayne Snare, MD  Insulin Human (INSULIN PUMP) 100 unit/ml SOLN Inject 1 each into the skin continuous. Novolog insulin    Historical Provider, MD  lisinopril (PRINIVIL,ZESTRIL) 10 MG tablet TAKE ONE TABLET BY MOUTH IN THE MORNING FOR  BLOOD  PRESSURE 04/17/14   Leone Brand, MD  ranitidine (ZANTAC) 150 MG tablet Take 1 tablet (150 mg total) by mouth 2 (two) times daily. 08/30/12   Kandis Nab, MD  venlafaxine (EFFEXOR) 37.5 MG tablet TAKE ONE TABLET BY MOUTH TWICE DAILY 02/05/14   Nicholas Lose, MD  VITAMIN D, CHOLECALCIFEROL, PO Take 1 tablet by mouth daily.     Historical Provider, MD   Triage Vitals: BP 147/88 mmHg  Pulse 77  Temp(Src) 98.1 F (36.7 C) (Oral)  Resp 16  Ht 5\' 5"  (1.651 m)  Wt 211 lb (95.709 kg)  BMI 35.11 kg/m2  SpO2 96%  Physical Exam  Constitutional: She is oriented to person, place, and time. She appears well-developed and well-nourished. No distress.  HENT:  Head: Normocephalic and atraumatic.  Eyes: Conjunctivae and EOM are normal.  Neck: Neck supple. No tracheal deviation present.  Cardiovascular: Normal rate.   Pulmonary/Chest: Effort normal. No respiratory distress.  Musculoskeletal: Normal range of motion.  No acute bony tenderness. Pulses intact  Neurological: She is alert and oriented to person, place, and time.  Skin: Skin is warm and dry.  Psychiatric: She has a normal mood and affect.  Her behavior is normal.  Nursing note and vitals reviewed.   ED Course  Procedures (including critical care time)  DIAGNOSTIC STUDIES: Oxygen Saturation is 96% on RA, normal by my interpretation.    COORDINATION OF CARE: 9:24 PM- Will order an x-ray of right foot.  Pt advised of plan for treatment and pt agrees.  Labs Review Labs Reviewed - No data to display  Imaging Review Dg Foot Complete Right  09/10/2014   CLINICAL DATA:  Right foot pain, after dropping table on foot. Pain at the first metatarsophalangeal joint. Initial encounter.  EXAM: RIGHT FOOT COMPLETE - 3+ VIEW  COMPARISON:  None.  FINDINGS: There is no evidence of fracture or dislocation. The joint spaces are preserved. There is no evidence of talar subluxation; the subtalar joint is unremarkable in appearance. An os peroneum is noted.  No significant soft tissue abnormalities are seen.  IMPRESSION: 1. No evidence of fracture or dislocation. 2. Os peroneum noted.   Electronically Signed   By: Garald Balding M.D.   On: 09/10/2014 21:54      EKG Interpretation None      MDM   Final diagnoses:  Right foot pain    No acute bony abnormality noted. Discussed with pt the need to take gabapentin  I personally performed the services described in this documentation, which was scribed in my presence. The recorded information has been reviewed and is accurate.      Glendell Docker, NP 09/10/14 2426  Leo Grosser, MD 09/11/14 [phone]

## 2014-09-29 ENCOUNTER — Other Ambulatory Visit: Payer: Self-pay | Admitting: Family Medicine

## 2014-09-29 DIAGNOSIS — E138 Other specified diabetes mellitus with unspecified complications: Secondary | ICD-10-CM

## 2014-09-29 MED ORDER — INSULIN ASPART 100 UNIT/ML ~~LOC~~ SOLN: SUBCUTANEOUS | Status: DC

## 2014-09-29 NOTE — Telephone Encounter (Signed)
Needs refill on novolog.  Will be leaving sept 7 to go out of town and needs the insulin before she leaves [employer]

## 2014-09-30 NOTE — Telephone Encounter (Signed)
LM for patient to call back.  Please assist her in making an appt with PCP.  Will send a mychart message. Jazmin Hartsell,CMA

## 2014-10-29 ENCOUNTER — Other Ambulatory Visit: Payer: Self-pay | Admitting: Family Medicine

## 2014-10-29 MED ORDER — ATORVASTATIN CALCIUM 40 MG PO TABS: 40.0000 mg | ORAL_TABLET | Freq: Every day | ORAL | Status: DC

## 2014-10-29 NOTE — Telephone Encounter (Signed)
Received fax from pharmacy regarding statin start, patient stated willing to start medicine. Sent in rx for atorvastatin, left VM informing prescription was sent. -Dr. Lamar Benes

## 2014-11-09 ENCOUNTER — Ambulatory Visit (INDEPENDENT_AMBULATORY_CARE_PROVIDER_SITE_OTHER): Payer: Commercial Managed Care - PPO | Admitting: Family Medicine

## 2014-11-09 ENCOUNTER — Encounter: Payer: Self-pay | Admitting: Family Medicine

## 2014-11-09 VITALS — BP 134/70 | HR 81 | Temp 98.1°F | Ht 65.0 in | Wt 210.0 lb

## 2014-11-09 DIAGNOSIS — I10 Essential (primary) hypertension: Secondary | ICD-10-CM | POA: Diagnosis not present

## 2014-11-09 DIAGNOSIS — E1065 Type 1 diabetes mellitus with hyperglycemia: Secondary | ICD-10-CM | POA: Diagnosis not present

## 2014-11-09 DIAGNOSIS — IMO0002 Reserved for concepts with insufficient information to code with codable children: Secondary | ICD-10-CM

## 2014-11-09 DIAGNOSIS — E138 Other specified diabetes mellitus with unspecified complications: Secondary | ICD-10-CM | POA: Diagnosis not present

## 2014-11-09 DIAGNOSIS — E108 Type 1 diabetes mellitus with unspecified complications: Secondary | ICD-10-CM | POA: Diagnosis not present

## 2014-11-09 DIAGNOSIS — H9191 Unspecified hearing loss, right ear: Secondary | ICD-10-CM | POA: Insufficient documentation

## 2014-11-09 DIAGNOSIS — H905 Unspecified sensorineural hearing loss: Secondary | ICD-10-CM

## 2014-11-09 LAB — POCT GLYCOSYLATED HEMOGLOBIN (HGB A1C): HEMOGLOBIN A1C: 11

## 2014-11-09 MED ORDER — INSULIN ASPART 100 UNIT/ML ~~LOC~~ SOLN: SUBCUTANEOUS | Status: DC

## 2014-11-09 NOTE — Progress Notes (Signed)
   Subjective:    Patient ID: [name], female    DOB: [birth year], 44 y.o.   MRN: [id number]  HPI  CC: follow up  # Type 1 DM:  Feels doing okay  Insulin: primarily doing basal, not really doing bolus dosing. She says this is because she will run out of insulin, currently gets 3 vials but really needs 4. Had told previous endocrinologist this but was only prescribed 3.  Diet: eats "normal". 24hr recall: eggplant parm, carrot cake, french toast sticks, water, sugar-free koolaid.  She reports husband frequently giving her candy bars.  Exercise: none regular. Did a 5K walk recently (1hr53mins).   Has not seen endocrinologist in a while. She says she wants to re-establish with a new doctor, Dr. [name]. Says she called and earliest appointment was January, but needs a referral.  ROS: no polyuria, polydipsia  # Hypertension  Taking lisinopril as prescribed  No adverse side effects ROS: No CP, no SOB, no HA  # Right ear hearing changes  Sounds like Sea-shell, amplified sound  Sea-shell change happens several times a week. Amplified sound is almost daily.  First noticed a couple years ago, wasn't this frequent  No recent changes. 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   Past medical history, surgical, family, and social history reviewed and updated in the EMR as appropriate. Objective:  BP 148/84 mmHg  Pulse 81  Temp(Src) 98.1 F (36.7 C) (Oral)  Ht 5\' 5"  (1.651 m)  Wt 210 lb (95.255 kg)  BMI 34.95 kg/m2 Vitals and nursing note reviewed Repeat BP 134/70  General: NAD HEENT: TMs pearly gray bilaterally (left TM initially obstructed by cerumen, this was washed out), no perforations, no erythema, no effusions. Both ear canals are  also normal in appearance.  CV: RRR, normal s1s2, no murmurs/rub/gallop, 2+ radial pulses bilateralyl Resp: clear to auscultation bilaterally, normal effort Ext: no edema Neuro: alert and oriented, no deficits noted. Gait is normal. Psych: mood normal, affected congruent. Normal thought content/speech.  Assessment & Plan:  Type I diabetes mellitus with complication, uncontrolled A1c continues to worsen, now 11. She reports only basal dosing currently because she says she does not get enough insulin per month to do bolus dosing. New refill sent with 4 vials. New referral sent to endocrinology as she needs a higher level of care with insulin pump and currently uncontrolled. Instructed on importance of diet management, avoiding sugar, basal and bolus insulin dosing. Follow up 3 months.  Essential hypertension Initial BP elevated, repeat under control. Will continue current dosing of lisinopril at 10mg . Follow up 3 months.  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 No evidence of TM damage on exam. Refer to audiology for hearing to be checked. Recommend she move herself away from speaker during practice/performance, use hearing protection (told about concert ear plugs in addition to foam). Follow up after audiology evaluation.

## 2014-11-09 NOTE — Assessment & Plan Note (Signed)
A1c continues to worsen, now 11. She reports only basal dosing currently because she says she does not get enough insulin per month to do bolus dosing. New refill sent with 4 vials. New referral sent to endocrinology as she needs a higher level of care with insulin pump and currently uncontrolled. Instructed on importance of diet management, avoiding sugar, basal and bolus insulin dosing. Follow up 3 months.

## 2014-11-09 NOTE — Assessment & Plan Note (Signed)
Initial BP elevated, repeat under control. Will continue current dosing of lisinopril at 10mg . Follow up 3 months.

## 2014-11-09 NOTE — Assessment & Plan Note (Signed)
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 No evidence of TM damage on exam. Refer to audiology for hearing to be checked. Recommend she move herself away from speaker during practice/performance, use hearing protection (told about concert ear plugs in addition to foam). Follow up after audiology evaluation.

## 2014-11-09 NOTE — Patient Instructions (Signed)
If blood pressure is still elevated, schedule with nurse to check your blood pressure in 2 weeks  Referral sent to audiologist to get hearing tested, to endocrinology to get established again.

## 2014-11-23 ENCOUNTER — Other Ambulatory Visit: Payer: Self-pay | Admitting: Hematology and Oncology

## 2014-11-23 ENCOUNTER — Encounter: Payer: Self-pay | Admitting: *Deleted

## 2014-11-23 ENCOUNTER — Other Ambulatory Visit: Payer: Commercial Managed Care - PPO

## 2014-11-23 DIAGNOSIS — C50912 Malignant neoplasm of unspecified site of left female breast: Secondary | ICD-10-CM

## 2014-11-23 DIAGNOSIS — E559 Vitamin D deficiency, unspecified: Secondary | ICD-10-CM | POA: Insufficient documentation

## 2014-11-23 DIAGNOSIS — Z853 Personal history of malignant neoplasm of breast: Secondary | ICD-10-CM | POA: Diagnosis not present

## 2014-11-23 LAB — COMPREHENSIVE METABOLIC PANEL (CC13)
ALT: 30 U/L (ref 0–55)
ANION GAP: 9 meq/L (ref 3–11)
AST: 25 U/L (ref 5–34)
Albumin: 3.5 g/dL (ref 3.5–5.0)
Alkaline Phosphatase: 160 U/L — ABNORMAL HIGH (ref 40–150)
BUN: 11.3 mg/dL (ref 7.0–26.0)
CALCIUM: 9.3 mg/dL (ref 8.4–10.4)
CHLORIDE: 109 meq/L (ref 98–109)
CO2: 24 mEq/L (ref 22–29)
Creatinine: 0.9 mg/dL (ref 0.6–1.1)
EGFR: 86 mL/min/{1.73_m2} — ABNORMAL LOW (ref >90)
Glucose: 237 mg/dl — ABNORMAL HIGH (ref 70–140)
POTASSIUM: 3.7 meq/L (ref 3.5–5.1)
Sodium: 142 mEq/L (ref 136–145)
Total Bilirubin: 0.71 mg/dL (ref 0.20–1.20)
Total Protein: 6.9 g/dL (ref 6.4–8.3)

## 2014-11-23 LAB — CBC WITH DIFFERENTIAL/PLATELET
BASO%: 0.8 % (ref 0.0–2.0)
Basophils Absolute: 0 10*3/uL (ref 0.0–0.1)
EOS ABS: 0.1 10*3/uL (ref 0.0–0.5)
EOS%: 2.9 % (ref 0.0–7.0)
HEMATOCRIT: 39.8 % (ref 34.8–46.6)
HGB: 13.7 g/dL (ref 11.6–15.9)
LYMPH%: 47.6 % (ref 14.0–49.7)
MCH: 28.8 pg (ref 25.1–34.0)
MCHC: 34.5 g/dL (ref 31.5–36.0)
MCV: 83.4 fL (ref 79.5–101.0)
MONO#: 0.2 10*3/uL (ref 0.1–0.9)
MONO%: 4.3 % (ref 0.0–14.0)
NEUT#: 2.2 10*3/uL (ref 1.5–6.5)
NEUT%: 44.4 % (ref 38.4–76.8)
Platelets: 230 10*3/uL (ref 145–400)
RBC: 4.77 10*6/uL (ref 3.70–5.45)
RDW: 13.3 % (ref 11.2–14.5)
WBC: 4.9 10*3/uL (ref 3.9–10.3)
lymph#: 2.4 10*3/uL (ref 0.9–3.3)

## 2014-11-23 LAB — VITAMIN D 25 HYDROXY (VIT D DEFICIENCY, FRACTURES): Vit D, 25-Hydroxy: 22 ng/mL — ABNORMAL LOW (ref 30–100)

## 2014-11-29 NOTE — Assessment & Plan Note (Signed)
Stage II B (T2 N2 MX) invasive ductal carcinoma of the left breast patient is status post mastectomy for multifocal disease and no positive disease largest tumor measured 2.4 cm. Tumor was ER negative PR negative HER-2/neu negative with a Ki-67 of 78%. Status post adjuvant chemotherapy on NSABP B 49 TC 6 cycles followed by adjuvant radiation and currently on observation  Breast cancer surveillance: 1. Mammogram right breast done December 2015 is normal 2. Breast exam10/31/2016 is normal 3. Elevated alkaline phosphatase: CT chest abdomen and pelvis and a bone scan were normal 02/16/2014. Unclear etiology. I believe it is related to severe vitamin D deficiency. P Her alkaline phosphatase levels have normalized.  (Patient is a type I diabetic and is awaiting her hemoglobin A1c to decrease below 8 so that she can undergo breast reconstruction. She is going through school for hospitality management.) Patient is going to [place] for 10 days and is very excited about it.  Return to clinic in 6 months for follow-up

## 2014-11-30 ENCOUNTER — Emergency Department (HOSPITAL_COMMUNITY)
Admission: EM | Admit: 2014-11-30 | Discharge: 2014-11-30 | Disposition: A | Discharge status: Home / Self Care | Payer: Commercial Managed Care - PPO | Attending: Emergency Medicine | Admitting: Emergency Medicine

## 2014-11-30 ENCOUNTER — Ambulatory Visit (HOSPITAL_BASED_OUTPATIENT_CLINIC_OR_DEPARTMENT_OTHER): Payer: Commercial Managed Care - PPO | Admitting: Hematology and Oncology

## 2014-11-30 ENCOUNTER — Encounter: Payer: Self-pay | Admitting: Hematology and Oncology

## 2014-11-30 ENCOUNTER — Telehealth: Payer: Self-pay | Admitting: Hematology and Oncology

## 2014-11-30 ENCOUNTER — Encounter (HOSPITAL_COMMUNITY): Payer: Self-pay | Admitting: Emergency Medicine

## 2014-11-30 VITALS — BP 112/73 | HR 80 | Temp 98.1°F | Resp 18 | Ht 65.0 in | Wt 203.3 lb

## 2014-11-30 DIAGNOSIS — R1084 Generalized abdominal pain: Secondary | ICD-10-CM | POA: Diagnosis not present

## 2014-11-30 DIAGNOSIS — R109 Unspecified abdominal pain: Secondary | ICD-10-CM

## 2014-11-30 DIAGNOSIS — R739 Hyperglycemia, unspecified: Secondary | ICD-10-CM

## 2014-11-30 DIAGNOSIS — R197 Diarrhea, unspecified: Secondary | ICD-10-CM | POA: Diagnosis not present

## 2014-11-30 DIAGNOSIS — Z8669 Personal history of other diseases of the nervous system and sense organs: Secondary | ICD-10-CM | POA: Insufficient documentation

## 2014-11-30 DIAGNOSIS — Z853 Personal history of malignant neoplasm of breast: Secondary | ICD-10-CM

## 2014-11-30 DIAGNOSIS — I10 Essential (primary) hypertension: Secondary | ICD-10-CM | POA: Diagnosis not present

## 2014-11-30 DIAGNOSIS — R748 Abnormal levels of other serum enzymes: Secondary | ICD-10-CM | POA: Diagnosis not present

## 2014-11-30 DIAGNOSIS — Z8742 Personal history of other diseases of the female genital tract: Secondary | ICD-10-CM | POA: Diagnosis not present

## 2014-11-30 DIAGNOSIS — E1165 Type 2 diabetes mellitus with hyperglycemia: Secondary | ICD-10-CM | POA: Insufficient documentation

## 2014-11-30 DIAGNOSIS — R110 Nausea: Secondary | ICD-10-CM | POA: Diagnosis not present

## 2014-11-30 DIAGNOSIS — E785 Hyperlipidemia, unspecified: Secondary | ICD-10-CM | POA: Diagnosis not present

## 2014-11-30 DIAGNOSIS — Z794 Long term (current) use of insulin: Secondary | ICD-10-CM | POA: Insufficient documentation

## 2014-11-30 DIAGNOSIS — Z79899 Other long term (current) drug therapy: Secondary | ICD-10-CM | POA: Insufficient documentation

## 2014-11-30 DIAGNOSIS — E119 Type 2 diabetes mellitus without complications: Secondary | ICD-10-CM

## 2014-11-30 DIAGNOSIS — C50412 Malignant neoplasm of upper-outer quadrant of left female breast: Secondary | ICD-10-CM

## 2014-11-30 LAB — CBC
HCT: 39.4 % (ref 36.0–46.0)
Hemoglobin: 14.2 g/dL (ref 12.0–15.0)
MCH: 29.3 pg (ref 26.0–34.0)
MCHC: 36 g/dL (ref 30.0–36.0)
MCV: 81.2 fL (ref 78.0–100.0)
Platelets: 228 10*3/uL (ref 150–400)
RBC: 4.85 MIL/uL (ref 3.87–5.11)
RDW: 12.9 % (ref 11.5–15.5)
WBC: 6.3 10*3/uL (ref 4.0–10.5)

## 2014-11-30 LAB — CBG MONITORING, ED
Glucose-Capillary: 443 mg/dL — ABNORMAL HIGH (ref 65–99)
Glucose-Capillary: 245 mg/dL — ABNORMAL HIGH (ref 65–99)

## 2014-11-30 LAB — COMPREHENSIVE METABOLIC PANEL
ALK PHOS: 165 U/L — AB (ref 38–126)
ALT: 22 U/L (ref 14–54)
AST: 19 U/L (ref 15–41)
Albumin: 3.6 g/dL (ref 3.5–5.0)
Anion gap: 12 (ref 5–15)
BUN: 10 mg/dL (ref 6–20)
CHLORIDE: 101 mmol/L (ref 101–111)
CO2: 23 mmol/L (ref 22–32)
CREATININE: 1.05 mg/dL — AB (ref 0.44–1.00)
Calcium: 9.7 mg/dL (ref 8.9–10.3)
GFR calc Af Amer: >60 mL/min (ref >60)
Glucose, Bld: 517 mg/dL — ABNORMAL HIGH (ref 65–99)
Potassium: 4 mmol/L (ref 3.5–5.1)
Sodium: 136 mmol/L (ref 135–145)
Total Bilirubin: 1 mg/dL (ref 0.3–1.2)
Total Protein: 7 g/dL (ref 6.5–8.1)

## 2014-11-30 LAB — URINE MICROSCOPIC-ADD ON

## 2014-11-30 LAB — LIPASE, BLOOD: LIPASE: 21 U/L (ref 11–51)

## 2014-11-30 LAB — URINALYSIS, ROUTINE W REFLEX MICROSCOPIC
Bilirubin Urine: NEGATIVE
Hgb urine dipstick: NEGATIVE
KETONES UR: 40 mg/dL — AB
LEUKOCYTES UA: NEGATIVE
NITRITE: NEGATIVE
Protein, ur: NEGATIVE mg/dL
SPECIFIC GRAVITY, URINE: 1.043 — AB (ref 1.005–1.030)
Urobilinogen, UA: 1 mg/dL (ref 0.0–1.0)
pH: 5.5 (ref 5.0–8.0)

## 2014-11-30 MED ORDER — DICYCLOMINE HCL 20 MG PO TABS: 20.0000 mg | ORAL_TABLET | Freq: Three times a day (TID) | ORAL | Status: DC

## 2014-11-30 MED ORDER — MORPHINE SULFATE (PF) 4 MG/ML IV SOLN
4.0000 mg | Freq: Once | INTRAVENOUS | Status: AC
  Administered 2014-11-30: 4 mg via INTRAVENOUS
  Filled 2014-11-30: qty 1

## 2014-11-30 MED ORDER — SODIUM CHLORIDE 0.9 % IV BOLUS (SEPSIS)
1000.0000 mL | Freq: Once | INTRAVENOUS | Status: AC
  Administered 2014-11-30: 1000 mL via INTRAVENOUS

## 2014-11-30 MED ORDER — LOPERAMIDE HCL 2 MG PO CAPS: 2.0000 mg | ORAL_CAPSULE | Freq: Four times a day (QID) | ORAL | Status: DC | PRN

## 2014-11-30 MED ORDER — ONDANSETRON 4 MG PO TBDP: 4.0000 mg | ORAL_TABLET | Freq: Three times a day (TID) | ORAL | Status: DC | PRN

## 2014-11-30 MED ORDER — ONDANSETRON HCL 4 MG/2ML IJ SOLN
4.0000 mg | Freq: Once | INTRAMUSCULAR | Status: AC
Start: 2014-11-30 — End: 2014-11-30
  Administered 2014-11-30: 4 mg via INTRAVENOUS
  Filled 2014-11-30: qty 2

## 2014-11-30 MED ORDER — INSULIN ASPART 100 UNIT/ML ~~LOC~~ SOLN
10.0000 [IU] | Freq: Once | SUBCUTANEOUS | Status: AC
  Administered 2014-11-30: 10 [IU] via INTRAVENOUS
  Filled 2014-11-30: qty 1

## 2014-11-30 NOTE — Addendum Note (Signed)
Addended by: Jonelle Sports K on: 11/30/2014 11:11 AM   Modules accepted: Orders, Medications

## 2014-11-30 NOTE — Progress Notes (Signed)
Patient Care Team: Leone Brand, MD as PCP - General Calvert Cantor, MD (Ophthalmology)  DIAGNOSIS: No matching staging information was found for the patient.  SUMMARY OF ONCOLOGIC HISTORY:   Cancer of left breast ([place])   01/19/2011 Initial Diagnosis Cancer of left breast   01/27/2011 Procedure Genetic testing was negative for BRCA1 and BRCA2 gene mutations   02/21/2011 Surgery Left modified radical mastectomy with axillary lymph node dissection T2 N2 M0 ER 0%, PR 0%, HER-2 negative ratio 1.41, multifocal grade 3 largest 2.4 cm other foci 1.2, 1, 1, 0.2 cm with high-grade DCIS, and VI, 7/14 lymph nodes positive   03/22/2011 - 07/17/2011 Chemotherapy NSABP B 49 clinical trial randomized to 6 cycles of Taxotere and Cytoxan   08/29/2011 - 10/10/2011 Radiation Therapy Adjuvant radiation therapy   02/12/2014 Imaging CT chest abdomen pelvis and bone scan were normal the test was done to evaluate elevated alkaline phosphatase    CHIEF COMPLIANT: Follow-up of breast cancer  INTERVAL HISTORY: [name] is a 44 year old with above-mentioned history of type 1 diabetes and left breast cancer treated with mastectomy. She was in the emergency room with nausea and abdominal pain and was discharged home. She had elevated blood sugars in the 500s. She tells me that she has not been watching her sugars. She has not been checking her keeping what she eats. She is now starting to do better on these settings. She went to [place] and enjoyed her trip.  REVIEW OF SYSTEMS:   Constitutional: Denies fevers, chills or abnormal weight loss Eyes: Denies blurriness of vision Ears, nose, mouth, throat, and face: Denies mucositis or sore throat Respiratory: Denies cough, dyspnea or wheezes Cardiovascular: Denies palpitation, chest discomfort or lower extremity swelling Gastrointestinal:  Denies nausea, heartburn or change in bowel habits Skin: Denies abnormal skin rashes Lymphatics: Denies new lymphadenopathy or  easy bruising Neurological:Denies numbness, tingling or new weaknesses Behavioral/Psych: Mood is stable, no new changes  Breast:  denies any pain or lumps or nodules inright breast or left chest wall or axilla All other systems were reviewed with the patient and are negative.  I have reviewed the past medical history, past surgical history, social history and family history with the patient and they are unchanged from previous note.  ALLERGIES:  is allergic to hydrocodone-acetaminophen; insulin glargine; and nph iletin i.  MEDICATIONS:  Current Outpatient Prescriptions  Medication Sig Dispense Refill  . acetaminophen (TYLENOL) 500 MG tablet Take 1 tablet (500 mg total) by mouth every 6 (six) hours as needed for pain. 30 tablet 0  . atorvastatin (LIPITOR) 40 MG tablet Take 1 tablet (40 mg total) by mouth daily. 30 tablet 3  . cetirizine (ZYRTEC) 10 MG tablet Take 1 tablet (10 mg total) by mouth daily. (Patient taking differently: Take 10 mg by mouth daily as needed for allergies. ) 30 tablet 11  . Chromium Picolinate 500 MCG CAPS Take 500 mcg by mouth daily.    Marland Kitchen dicyclomine (BENTYL) 20 MG tablet Take 1 tablet (20 mg total) by mouth 3 (three) times daily before meals. As needed for abdominal cramping 20 tablet 0  . ergocalciferol (VITAMIN D2) 50000 UNITS capsule Take 1 capsule (50,000 Units total) by mouth once a week. 12 capsule 3  . glucose blood (ONE TOUCH ULTRA TEST) test strip Check blood sugar 3 times a day. 100 each 11  . Green Coffee Bean 400 MG CAPS Take 400 mg by mouth 3 (three) times daily.    . insulin aspart (  NOVOLOG) 100 UNIT/ML injection USE MAX 100 UNITS PER DAY WITH PUMP 40 mL 3  . Insulin Human (INSULIN PUMP) 100 unit/ml SOLN Inject 1 each into the skin continuous. Novolog insulin    . lisinopril (PRINIVIL,ZESTRIL) 10 MG tablet TAKE ONE TABLET BY MOUTH IN THE MORNING FOR  BLOOD  PRESSURE 90 tablet 3  . loperamide (IMODIUM) 2 MG capsule Take 1 capsule (2 mg total) by mouth 4  (four) times daily as needed for diarrhea or loose stools. 20 capsule 0  . ondansetron (ZOFRAN ODT) 4 MG disintegrating tablet Take 1 tablet (4 mg total) by mouth every 8 (eight) hours as needed for nausea or vomiting. 20 tablet 0  . ranitidine (ZANTAC) 150 MG tablet Take 1 tablet (150 mg total) by mouth 2 (two) times daily. (Patient taking differently: Take 150 mg by mouth 2 (two) times daily as needed for heartburn. ) 60 tablet 2  . venlafaxine (EFFEXOR) 37.5 MG tablet TAKE ONE TABLET BY MOUTH TWICE DAILY 60 tablet 1   No current facility-administered medications for this visit.    PHYSICAL EXAMINATION: ECOG PERFORMANCE STATUS: 1 - Symptomatic but completely ambulatory  Filed Vitals:   11/30/14 0856  BP: 112/73  Pulse: 80  Temp: 98.1 F (36.7 C)  Resp: 18   Filed Weights   11/30/14 0856  Weight: 203 lb 4.8 oz (92.216 kg)    GENERAL:alert, no distress and comfortable SKIN: skin color, texture, turgor are normal, no rashes or significant lesions EYES: normal, Conjunctiva are pink and non-injected, sclera clear OROPHARYNX:no exudate, no erythema and lips, buccal mucosa, and tongue normal  NECK: supple, thyroid normal size, non-tender, without nodularity LYMPH:  no palpable lymphadenopathy in the cervical, axillary or inguinal LUNGS: clear to auscultation and percussion with normal breathing effort HEART: regular rate & rhythm and no murmurs and no lower extremity edema ABDOMEN:abdomen soft, non-tender and normal bowel sounds Musculoskeletal:no cyanosis of digits and no clubbing  NEURO: alert & oriented x 3 with fluent speech, no focal motor/sensory deficits BREAST: No palpable masses or nodules inright breast or axilla. Left chest wall and axilla were palpated and there were normal with evidence of previous mastectomy(exam performed in the presence of a chaperone)  LABORATORY DATA:  I have reviewed the data as listed   Chemistry      Component Value Date/Time   NA 136  11/30/2014 0505   NA 142 11/23/2014 0815   K 4.0 11/30/2014 0505   K 3.7 11/23/2014 0815   CL 101 11/30/2014 0505   CL 107 07/15/2012 1021   CO2 23 11/30/2014 0505   CO2 24 11/23/2014 0815   BUN 10 11/30/2014 0505   BUN 11.3 11/23/2014 0815   CREATININE 1.05* 11/30/2014 0505   CREATININE 0.9 11/23/2014 0815      Component Value Date/Time   CALCIUM 9.7 11/30/2014 0505   CALCIUM 9.3 11/23/2014 0815   ALKPHOS 165* 11/30/2014 0505   ALKPHOS 160* 11/23/2014 0815   AST 19 11/30/2014 0505   AST 25 11/23/2014 0815   ALT 22 11/30/2014 0505   ALT 30 11/23/2014 0815   BILITOT 1.0 11/30/2014 0505   BILITOT 0.71 11/23/2014 0815       Lab Results  Component Value Date   WBC 6.3 11/30/2014   HGB 14.2 11/30/2014   HCT 39.4 11/30/2014   MCV 81.2 11/30/2014   PLT 228 11/30/2014   NEUTROABS 2.2 11/23/2014    ASSESSMENT & PLAN:  Cancer of left breast Stage II B (T2 N2  MX) invasive ductal carcinoma of the left breast patient is status post mastectomy for multifocal disease and no positive disease largest tumor measured 2.4 cm. Tumor was ER negative PR negative HER-2/neu negative with a Ki-67 of 78%. Status post adjuvant chemotherapy on NSABP B 49 TC 6 cycles followed by adjuvant radiation and currently on observation  Breast cancer surveillance: 1. Mammogram right breast done December 2015 is normal 2. Breast exam10/31/2016 is normal 3. Elevated alkaline phosphatase: CT chest abdomen and pelvis and a bone scan were normal 02/16/2014. I believe it is related to severe vitamin D deficiency.  (Patient is a type I diabetic and is awaiting her hemoglobin A1c to decrease below 8 so that she can undergo breast reconstruction. She is going through school for hospitality management.)  Recent ER visit for nausea and abdominal pain: Sugars were in the 500s. She reported that she has not been watching her sugars does not check her blood sugars and has not been careful with her diet. I discussed with  her that it is dangerous certainly can lead to diabetic ketoacidosis and can even kill her. I'm more worried about her type 1 diabetes than breast cancer.  Return to clinic in 1 year for follow-up   No orders of the defined types were placed in this encounter.   The patient has a good understanding of the overall plan. she agrees with it. she will call with any problems that may develop before the next visit here.   Rulon Eisenmenger, MD 11/30/2014

## 2014-11-30 NOTE — Telephone Encounter (Signed)
Appointments made and avs printed for patient °

## 2014-11-30 NOTE — ED Provider Notes (Signed)
TIME SEEN: 5:45 AM  CHIEF COMPLAINT: Abdominal pain, nausea, diarrhea  HPI: Pt is a 44 y.o. female with history of insulin-dependent diabetes with an insulin pump, hyperlipidemia, previous history of breast cancer who presents emergency department with diffuse crampy generalized abdominal pain, nausea and diarrhea that started this morning at 3:30 AM and woke her from sleep. No associated vomiting. No dysuria, hematuria, vaginal bleeding or discharge. Has had a C-section but no other abdominal surgery. Denies any fever.  ROS: See HPI Constitutional: no fever  Eyes: no drainage  ENT: no runny nose   Cardiovascular:  no chest pain  Resp: no SOB  GI: no vomiting GU: no dysuria Integumentary: no rash  Allergy: no hives  Musculoskeletal: no leg swelling  Neurological: no slurred speech ROS otherwise negative  PAST MEDICAL HISTORY/PAST SURGICAL HISTORY:  Past Medical History  Diagnosis Date  . Breast mass in female     fibrocystic changes  . Hyperlipidemia   . Nausea   . Hypertension   . Nausea & vomiting     for approx. 2 weeks  . Cataracts, bilateral   . Diabetes mellitus     insulin dependant - uses pump, Dr. Dwyane Dee  . Breast cancer ([place])     T2N2 tiple negative left breast ca   . S/P chemotherapy, time since 4-12 weeks   . History of chemotherapy     taxotere/cytoxan with neulasta support  . Lymphedema of arm     left upper extremity  . Blood clot in vein     nonocclusive clot right subclavian vein, on Xarelto for 6 months  . Allergy   . Breast cancer ([place])   . Radiation 08/21/11-10/05/11    Left chestwll/Supraclav./left PAB/left scar    MEDICATIONS:  Prior to Admission medications   Medication Sig Start Date End Date Taking? Authorizing Provider  acetaminophen (TYLENOL) 500 MG tablet Take 1 tablet (500 mg total) by mouth every 6 (six) hours as needed for pain. 08/19/12  Yes Leone Brand, MD  atorvastatin (LIPITOR) 40 MG tablet Take 1 tablet (40 mg total) by mouth  daily. 10/29/14  Yes Leone Brand, MD  cetirizine (ZYRTEC) 10 MG tablet Take 1 tablet (10 mg total) by mouth daily. Patient taking differently: Take 10 mg by mouth daily as needed for allergies.  08/30/12  Yes Kandis Nab, MD  Chromium Picolinate 500 MCG CAPS Take 500 mcg by mouth daily.   Yes Historical Provider, MD  ergocalciferol (VITAMIN D2) 50000 UNITS capsule Take 1 capsule (50,000 Units total) by mouth once a week. 02/27/14  Yes Nicholas Lose, MD  glucose blood (ONE TOUCH ULTRA TEST) test strip Check blood sugar 3 times a day. 04/17/14  Yes Lupita Dawn, MD  Nyoka Cowden Coffee Bean 400 MG CAPS Take 400 mg by mouth 3 (three) times daily.   Yes Historical Provider, MD  insulin aspart (NOVOLOG) 100 UNIT/ML injection USE MAX 100 UNITS PER DAY WITH PUMP 11/09/14  Yes Leone Brand, MD  Insulin Human (INSULIN PUMP) 100 unit/ml SOLN Inject 1 each into the skin continuous. Novolog insulin   Yes Historical Provider, MD  lisinopril (PRINIVIL,ZESTRIL) 10 MG tablet TAKE ONE TABLET BY MOUTH IN THE MORNING FOR  BLOOD  PRESSURE 04/17/14  Yes Leone Brand, MD  ranitidine (ZANTAC) 150 MG tablet Take 1 tablet (150 mg total) by mouth 2 (two) times daily. Patient taking differently: Take 150 mg by mouth 2 (two) times daily as needed for heartburn.  08/30/12  Yes  Kandis Nab, MD  venlafaxine (EFFEXOR) 37.5 MG tablet TAKE ONE TABLET BY MOUTH TWICE DAILY 02/05/14  Yes Nicholas Lose, MD    ALLERGIES:  Allergies  Allergen Reactions  . Hydrocodone-Acetaminophen Itching and Swelling  . Insulin Glargine Itching, Nausea And Vomiting and Swelling  . Nph Iletin I [Insulin Isophane, Mixed Nph] Itching, Nausea And Vomiting and Swelling    SOCIAL HISTORY:  Social History  Substance Use Topics  . Smoking status: Never Smoker   . Smokeless tobacco: Never Used  . Alcohol Use: No    FAMILY HISTORY: Family History  Problem Relation Age of Onset  . Hypertension    . Diabetes    . Deep vein thrombosis    . Kidney  disease    . Cancer Maternal Aunt     breast  . Cancer Maternal Uncle 50     right eye removed s/p cancer  . Cancer Maternal Grandfather     prostate ca    EXAM: BP 101/60 mmHg  Pulse 84  Temp(Src) 98.6 F (37 C) (Oral)  Resp 16  Ht 5\' 5"  (1.651 m)  Wt 210 lb (95.255 kg)  BMI 34.95 kg/m2  SpO2 100% CONSTITUTIONAL: Alert and oriented and responds appropriately to questions. Well-appearing; well-nourished HEAD: Normocephalic EYES: Conjunctivae clear, PERRL ENT: normal nose; no rhinorrhea; moist mucous membranes; pharynx without lesions noted NECK: Supple, no meningismus, no LAD  CARD: RRR; S1 and S2 appreciated; no murmurs, no clicks, no rubs, no gallops RESP: Normal chest excursion without splinting or tachypnea; breath sounds clear and equal bilaterally; no wheezes, no rhonchi, no rales, no hypoxia or respiratory distress, speaking full sentences ABD/GI: Normal bowel sounds; non-distended; soft, mildly tender to palpation throughout the abdomen, no rebound, no guarding, no peritoneal signs, no significant tenderness at McBurney's point, negative Murphy sign, patient has insulin pump with needle in the epigastric region without surrounding erythema, warmth or drainage BACK:  The back appears normal and is non-tender to palpation, there is no CVA tenderness EXT: Normal ROM in all joints; non-tender to palpation; no edema; normal capillary refill; no cyanosis, no calf tenderness or swelling    SKIN: Normal color for age and race; warm NEURO: Moves all extremities equally, sensation to light touch intact diffusely, cranial nerves II through XII intact PSYCH: The patient's mood and manner are appropriate. Grooming and personal hygiene are appropriate.  MEDICAL DECISION MAKING: Patient with diffuse abdominal pain. Her abdominal exam however is very benign. Doubt appendicitis, cholecystitis, pancreatitis, colitis, small bowel obstruction. Patient's labs ordered in triage are unremarkable  other than a blood glucose of 517. Bicarbonate is 23, anion gap 12. She is not in DKA.Marland Kitchen Alkaline phosphatase is elevated but otherwise LFTs, lipase normal. Urine shows no sign of infection. We'll give IV fluids, pain and nausea medicine, insulin and recheck blood glucose.  ED PROGRESS: Blood glucose has improved after IV fluids and insulin. Blood glucose is now 245. I feel she is safe to be discharged home. We'll discharge with prescription for Zofran, Imodium and Bentyl to use as needed. I do not feel there is any life-threatening condition present. Discussed all results, exam findings with patient. I feel the patient is safe to be discharged home without further emergent workup. Discussed usual and customary return precautions. Patient and family (if present) verbalize understanding and are comfortable with this plan.  Patient will follow-up with their primary care provider. If they do not have a primary care provider, information for follow-up has been provided to them.  All questions have been answered.      [place], DO 11/30/14 [id number]

## 2014-11-30 NOTE — Discharge Instructions (Signed)
Abdominal Pain, Adult °Many things can cause abdominal pain. Usually, abdominal pain is not caused by a disease and will improve without treatment. It can often be observed and treated at home. Your health care provider will do a physical exam and possibly order blood tests and X-rays to help determine the seriousness of your pain. However, in many cases, more time must pass before a clear cause of the pain can be found. Before that point, your health care provider may not know if you need more testing or further treatment. °HOME CARE INSTRUCTIONS °Monitor your abdominal pain for any changes. The following actions may help to alleviate any discomfort you are experiencing: °· Only take over-the-counter or prescription medicines as directed by your health care provider. °· Do not take laxatives unless directed to do so by your health care provider. °· Try a clear liquid diet (broth, tea, or water) as directed by your health care provider. Slowly move to a bland diet as tolerated. °SEEK MEDICAL CARE IF: °· You have unexplained abdominal pain. °· You have abdominal pain associated with nausea or diarrhea. °· You have pain when you urinate or have a bowel movement. °· You experience abdominal pain that wakes you in the night. °· You have abdominal pain that is worsened or improved by eating food. °· You have abdominal pain that is worsened with eating fatty foods. °· You have a fever. °SEEK IMMEDIATE MEDICAL CARE IF: °· Your pain does not go away within 2 hours. °· You keep throwing up (vomiting). °· Your pain is felt only in portions of the abdomen, such as the right side or the left lower portion of the abdomen. °· You pass bloody or black tarry stools. °MAKE SURE YOU: °· Understand these instructions. °· Will watch your condition. °· Will get help right away if you are not doing well or get worse. °  °This information is not intended to replace advice given to you by your health care provider. Make sure you discuss  any questions you have with your health care provider. °  °Document Released: 10/26/2004 Document Revised: 10/07/2014 Document Reviewed: 09/25/2012 °Elsevier Interactive Patient Education ©2016 Else[employer] Inc[employer] ° °Hyperglycemia °Hyperglycemia occurs when the glucose (sugar) in your blood is too high. Hyperglycemia can happen for many reasons, but it most often happens to people who do not know they have diabetes or are not managing their diabetes properly.  °CAUSES  °Whether you have diabetes or not, there are other causes of hyperglycemia. Hyperglycemia can occur when you have diabetes, but it can also occur in other situations that you might not be as aware of, such as: °Diabetes °· If you have diabetes and are having problems controlling your blood glucose, hyperglycemia could occur because of some of the following reasons: °¨ Not following your meal plan. °¨ Not taking your diabetes medications or not taking it properly. °¨ Exercising less or doing less activity than you normally do. °¨ Being sick. °Pre-diabetes °· This cannot be ignored. Before people develop Type 2 diabetes, they almost always have "pre-diabetes." This is when your blood glucose levels are higher than normal, but not yet high enough to be diagnosed as diabetes. Research has shown that some long-term damage to the body, especially the heart and circulatory system, may already be occurring during pre-diabetes. If you take action to manage your blood glucose when you have pre-diabetes, you may delay or prevent Type 2 diabetes from developing. °Stress °· If you have diabetes, you may be "diet"   controlled or on oral medications or insulin to control your diabetes. However, you may find that your blood glucose is higher than usual in the hospital whether you have diabetes or not. This is often referred to as "stress hyperglycemia." Stress can elevate your blood glucose. This happens because of hormones put out by the body during times of stress. If  stress has been the cause of your high blood glucose, it can be followed regularly by your caregiver. That way he/she can make sure your hyperglycemia does not continue to get worse or progress to diabetes. °Steroids °· Steroids are medications that act on the infection fighting system (immune system) to block inflammation or infection. One side effect can be a rise in blood glucose. Most people can produce enough extra insulin to allow for this rise, but for those who cannot, steroids make blood glucose levels go even higher. It is not unusual for steroid treatments to "uncover" diabetes that is developing. It is not always possible to determine if the hyperglycemia will go away after the steroids are stopped. A special blood test called an A1c is sometimes done to determine if your blood glucose was elevated before the steroids were started. °SYMPTOMS °· Thirsty. °· Frequent urination. °· Dry mouth. °· Blurred vision. °· Tired or fatigue. °· Weakness. °· Sleepy. °· Tingling in feet or leg. °DIAGNOSIS  °Diagnosis is made by monitoring blood glucose in one or all of the following ways: °· A1c test. This is a chemical found in your blood. °· Fingerstick blood glucose monitoring. °· Laboratory results. °TREATMENT  °First, knowing the cause of the hyperglycemia is important before the hyperglycemia can be treated. Treatment may include, but is not be limited to: °· Education. °· Change or adjustment in medications. °· Change or adjustment in meal plan. °· Treatment for an illness, infection, etc. °· More frequent blood glucose monitoring. °· Change in exercise plan. °· Decreasing or stopping steroids. °· Lifestyle changes. °HOME CARE INSTRUCTIONS  °· Test your blood glucose as directed. °· Exercise regularly. Your caregiver will give you instructions about exercise. Pre-diabetes or diabetes which comes on with stress is helped by exercising. °· Eat wholesome, balanced meals. Eat often and at regular, fixed times. Your  caregiver or nutritionist will give you a meal plan to guide your sugar intake. °· Being at an ideal weight is important. If needed, losing as little as 10 to 15 pounds may help improve blood glucose levels. °SEEK MEDICAL CARE IF:  °· You have questions about medicine, activity, or diet. °· You continue to have symptoms (problems such as increased thirst, urination, or weight gain). °SEEK IMMEDIATE MEDICAL CARE IF:  °· You are vomiting or have diarrhea. °· Your breath smells fruity. °· You are breathing faster or slower. °· You are very sleepy or incoherent. °· You have numbness, tingling, or pain in your feet or hands. °· You have chest pain. °· Your symptoms get worse even though you have been following your caregiver's orders. °· If you have any other questions or concerns. °  °This information is not intended to replace advice given to you by your health care provider. Make sure you discuss any questions you have with your health care provider. °  °Document Released: 07/12/2000 Document Revised: 04/10/2011 Document Reviewed: 09/22/2014 °Elsevier Interactive Patient Education ©2016 Elsevier Inc. ° °

## 2014-11-30 NOTE — ED Notes (Signed)
C/o generalized abd pain with nausea and diarrhea since 3:30am.

## 2014-12-24 DIAGNOSIS — E785 Hyperlipidemia, unspecified: Secondary | ICD-10-CM | POA: Diagnosis not present

## 2014-12-24 DIAGNOSIS — Z794 Long term (current) use of insulin: Secondary | ICD-10-CM | POA: Diagnosis not present

## 2014-12-24 DIAGNOSIS — F329 Major depressive disorder, single episode, unspecified: Secondary | ICD-10-CM | POA: Diagnosis not present

## 2014-12-24 DIAGNOSIS — E876 Hypokalemia: Secondary | ICD-10-CM | POA: Diagnosis present

## 2014-12-24 DIAGNOSIS — Z9641 Presence of insulin pump (external) (internal): Secondary | ICD-10-CM | POA: Diagnosis present

## 2014-12-24 DIAGNOSIS — I10 Essential (primary) hypertension: Secondary | ICD-10-CM | POA: Diagnosis not present

## 2014-12-24 DIAGNOSIS — E1310 Other specified diabetes mellitus with ketoacidosis without coma: Secondary | ICD-10-CM | POA: Diagnosis not present

## 2014-12-24 DIAGNOSIS — E1010 Type 1 diabetes mellitus with ketoacidosis without coma: Secondary | ICD-10-CM | POA: Diagnosis not present

## 2014-12-28 ENCOUNTER — Ambulatory Visit: Payer: Commercial Managed Care - PPO | Attending: Audiology | Admitting: Audiology

## 2014-12-28 DIAGNOSIS — H9011 Conductive hearing loss, unilateral, right ear, with unrestricted hearing on the contralateral side: Secondary | ICD-10-CM | POA: Diagnosis present

## 2014-12-28 DIAGNOSIS — R94120 Abnormal auditory function study: Secondary | ICD-10-CM | POA: Insufficient documentation

## 2014-12-28 DIAGNOSIS — H833X3 Noise effects on inner ear, bilateral: Secondary | ICD-10-CM | POA: Insufficient documentation

## 2014-12-28 DIAGNOSIS — H9325 Central auditory processing disorder: Secondary | ICD-10-CM | POA: Insufficient documentation

## 2014-12-28 DIAGNOSIS — H93292 Other abnormal auditory perceptions, left ear: Secondary | ICD-10-CM | POA: Diagnosis present

## 2014-12-28 DIAGNOSIS — H9191 Unspecified hearing loss, right ear: Secondary | ICD-10-CM | POA: Diagnosis present

## 2014-12-28 NOTE — Procedures (Signed)
[facility]  [employer] [employer], [place] [address]  [phone]   Audiological Evaluation  Patient Name: [name]   Status: Outpatient   DOB: 11-14-1970    Diagnosis: Right ear hearing loss MRN: [id number] Date:  12/28/2014     Referent: Tawanna Sat, MD  History: [name] was seen for an audiological evaluation. She states that "for the past two years she has had trouble with her right ear -  she has heard "sound of sea shells and a popping sound, sounds are muffled, there is a sharp pain with loud noise".  Significant is that [name] was "discharged from the hospital yesterday with diabetic keto-acidosis".  She states that she "feels great today". [name] reports having "neuropathy in hands and feet" that was made worse after she completed "chemotherapy for breast cancer treatment'.   [name] states that she "sometimes feels off-balance*.    [name] is currently attending "[employer]" and sometimes misses what is said.  By history, this may have been going on for some time because Khushbu's mother took her for a hearing evaluation in middle school because of hearing loss concerns. Medication: Norolog, Lisinopril, Lipitor.   Evaluation: Conventional pure tone audiometry from 250Hz  - 8000Hz  with using insert earphones.  Hearing Thresholds: Right ear:  Thresholds of 30 dBHL from 125Hz  - 250Hz ; 20 dBHL from 500Hz  - 750Hz ; and 15 dBHL from 1000Hz  - 8000Hz . There is a conductive component from 125Hz  - 500Hz .  Left ear:    Thresholds of 5-15 dBHL from 125Hz  - 8000Hz  Reliability is good Speech reception levels (repeating words near threshold) using recorded spondee word lists:  Right ear: 20 dBHL.  Left ear:  15 dBHL Word recognition (at comfortably loud volumes) using recorded word lists at 55 dBHL (50 dBHL contralateral masking), in quiet.  Right ear: 100%.  Left ear:   96% Word recognition in minimal background noise:  +5 dBHL   Right ear: 84%                              Left ear:  64%  Tympanometry (middle ear function) with present  ipsilateral acoustic reflexes at 1000Hz  bilaterally- additional testing was not completed because the loudness was "painful".  Right ear: Slightly shallow tympanic membrane compliance with normal pressure and volume  (Type As).  Left ear: Normal middle ear pressure, volume and compliance (Type A). Distortion Product Otoacoustic Emissions (DPOAE's), a test of inner ear function was completed from 2000Hz  - 10,000Hz  bilaterally:  Responses were symmetrical and were present from 2000Hz  - 2500Hz  supporting good outer hair cell function in the cochlea but were absent/abnormal in the high frequencies from 3000Hz  - 10,000Hz  bilaterally.   CONCLUSION:      [name] has a mild conductive low frequency hearing loss and shallow tympanic membrane mobility on the right side which may be related to the "popping" and "sea shell sound" on the right side which needs further evaluation by an ENT.  In addition, [name] will need close monitoring of her hearing every 6-12 months because the sound sensitivity and absent high frequency inner ear function results suggest a developing sensorineural component and close monitoring is needed to rule out a progressive hearing loss.  Hearing monitoring is especially important since [name] has "neuropathy", diabetes and a history of chemotherapy.   Hearing thresholds are within normal limits on the left side and for  the right ear high frequencies. Word recognition is excellent in the right ear and excellent in the left ear in quiet at conversational speech levels bilaterally. In minimal background noise, word recognition remains good in the right ear and drops to poor in the left ear. Left sided auditory weakness is a classic finding associated with Central Auditory Processing Disorder.  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   Since  [name] has poor word recognition with competing messages, missing a significant amount of information in most listening situations is expected such as in the classroom - when papers, book bags or physical movement or even with sitting near the hum of computers or overhead projectors. [name] needs to sit away from possible noise sources and near the teacher for optimal signal to noise, to improve the chance of correctly hearing.   Since processing delays are associated with CAPD, extended test times with the avoidance of timed examinations is needed.  It is also important that [name] is allowed to take examinations in a quiet area.     RECOMMENDATIONS: 1.  Further evaluation by an ENT because of the right ear low frequency conductive hearing loss, tinnitus, popping sounds and sound sensitivity.  2.  Closely monitor hearing with a repeat hearing evaluation here or at the ENT in 3-6 months to rule out a progressive hearing loss.  After that monitoring every 6-12 months is recommended.  3.Classroom modification to provide an appropriate education will be needed to include:  [name] has poor word recognition in background noise and may miss information in the classroom.  Using technology such as recording classes or using a smart pen may help, but strategic classroom placement for optimal hearing and recording will also be needed. Strategic placement should be away from noise sources, such as hall or street noise, ventilation fans or overhead projector noise etc.   Allow extended test times for in class and standardized examinations.   Allow [name] to take examinations in a quiet area, free from auditory distractions.   Allow access to new information prior to it being presented in class.  Providing notes, powerpoint slides or overhead projector sheets will  be of significant benefit.  4.  Use hearing protection during noisy activities.  Musician's plugs, are available from [employer] for music  related hearing protection because there is no distortion.  Other hearing protection, such as sponge plugs (available at pharmacies) or earmuffs (available at [employer] or [employer]) are useful for noisy activities and venues.  5.  Strategies that help improve hearing include: A) Face the speaker directly. Optimal is having the speakers face well - lit.  Unless amplified, being within 3-6 feet of the speaker will enhance word recognition. B) Avoid having the speaker back-lit as this will minimize the ability to use cues from lip-reading, facial expression and gestures. C)  Word recognition is poorer in background noise. For optimal word recognition, turn off the TV, radio or noisy fan when engaging in conversation. In a restaurant, try to sit away from noise sources and close to the primary speaker.  D)  Ask for topic clarification from time to time in order to remain in the conversation.  Most people don't mind repeating or clarifying a point when asked.  If needed, explain the difficulty hearing in background noise or hearing loss.   Deborah L. Heide Spark, [place], [place] 12/28/2014

## 2014-12-28 NOTE — Procedures (Signed)
Outpatient [facility]iology and [facility]  [place], [place] [address]  [phone]   Audiological Evaluation  Patient Name: [name]   Status: Outpatient   DOB: [birth year]    Diagnosis: Right ear hearing loss MRN: [id number] Date:  12/28/2014     Referent: Tawanna Sat, MD  History: [name] was seen for an audiological evaluation. She states that "for the past two years she has had trouble with her right ear -  she has heard "sound of sea shells and a popping sound, sounds are muffled, there is a sharp pain with loud noise".  Significant is that [employer] was "discharged from the hospital yesterday with diabetic keto-acidosis".  She states that she "feels great today". Kirstie reports having "neuropathy in hands and feet" that was made worse after she completed "chemotherapy for breast cancer treatment'.   Sherricka states that she "sometimes feels off-balance*.    Makiyah S Berroa is currently attending "[employer]" and sometimes misses what is said.  By history, this may have been going on for some time because Yudit's mother took her for a hearing evaluation in middle school because of hearing loss concerns. Medication: Norolog, Lisinopril, Lipitor.   Evaluation: Conventional pure tone audiometry from 250Hz  - 8000Hz  with using insert earphones.  Hearing Thresholds: Right ear:  Thresholds of 30 dBHL from 125Hz  - 250Hz ; 20 dBHL from 500Hz  - 750Hz ; and 15 dBHL from 1000Hz  - 8000Hz . There is a conductive component from 125Hz  - 500Hz .  Left ear:    Thresholds of 5-15 dBHL from 125Hz  - 8000Hz  Reliability is good Speech reception levels (repeating words near threshold) using recorded spondee word lists:  Right ear: 20 dBHL.  Left ear:  15 dBHL Word recognition (at comfortably loud volumes) using recorded word lists at 55 dBHL (50 dBHL contralateral masking), in quiet.  Right ear: 100%.  Left ear:   96% Word recognition in minimal background noise:  +5 dBHL   Right ear: 84%                              Left ear:  64%  Tympanometry (middle ear function) with present  ipsilateral acoustic reflexes at 1000Hz  bilaterally- additional testing was not completed because the loudness was "painful".  Right ear: Slightly shallow tympanic membrane compliance with normal pressure and volume  (Type As).  Left ear: Normal middle ear pressure, volume and compliance (Type A). Distortion Product Otoacoustic Emissions (DPOAE's), a test of inner ear function was completed from 2000Hz  - 10,000Hz  bilaterally:  Responses were symmetrical and were present from 2000Hz  - 2500Hz  supporting good outer hair cell function in the cochlea but were absent/abnormal in the high frequencies from 3000Hz  - 10,000Hz  bilaterally.   Uncomfortable Loudness Testing was performed using speech noise.  Anju reported that noise levels of 90dBHL on the right and 95 dBHL on the left "hurt".  By history that is supported by testing, Ann-Marie has lower than expected uncomfortable loudness levels or sound sensitivity.  Because of the abnormal inner ear function testing recruitment is of concern and close monitoring of hearing to rule out a progressive hearing loss is recommended.   Competing Sentences (CS) involved a different sentences being presented to each ear at different volumes. The instructions are to repeat the softer volume sentences. Posterior temporal issues will show poorer performance in the ear contralateral to the lobe involved.  Esmerelda scored 95% in the right ear  and 95% in the left ear.  The test results are significantly abnormal on each side. The results are consistent with Central Auditory Processing Disorder (CAPD) .  Dichotic Digits (DD) presents different two digits to each ear. All four digits are to be repeated. Poor performance suggests that cerebellar and/or brainstem may be involved. Tayten scored 95% in the right ear and 75% in the left ear. The test results indicate that [facility] scored  abnormal on the left side. The results are consistent with Central Auditory Processing Disorder (CAPD)   CONCLUSION:      Dorine S Belongia has a mild conductive low frequency hearing loss and shallow tympanic membrane mobility on the right side which may be related to the "popping" and "sea shell sound" on the right side which needs further evaluation by an ENT.  In addition, Angeliki will need close monitoring of her hearing every 6-12 months because the sound sensitivity and absent high frequency inner ear function results suggest a developing sensorineural component and close monitoring is needed to rule out a progressive hearing loss.  Hearing monitoring is especially important since Keyuna has "neuropathy", diabetes and a history of chemotherapy.   Hearing thresholds are within normal limits on the left side and for the right ear high frequencies. Word recognition is excellent in the right ear and excellent in the left ear in quiet at conversational speech levels bilaterally. In minimal background noise, word recognition remains good in the right ear and drops to poor in the left ear. Left sided auditory weakness is a classic finding associated with Central Auditory Processing Disorder.  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   Since Leota has poor word recognition with competing messages, missing a significant amount of information in most listening situations is expected such as in the classroom - when papers, book bags or physical movement or even with sitting near the hum of computers or overhead projectors. Amelie needs to sit away from possible noise sources and near the teacher for optimal signal to noise, to improve the chance of correctly hearing.   Since processing delays are associated with CAPD, extended test times with the avoidance of timed examinations is needed.  It is also important that [employer] is allowed to take examinations in a quiet area.      RECOMMENDATIONS: 1.  Further evaluation by an ENT because of the right ear low frequency conductive hearing loss, tinnitus, popping sounds and sound sensitivity.  2.  Closely monitor hearing with a repeat hearing evaluation here or at the ENT in 3-6 months to rule out a progressive hearing loss.  After that monitoring every 6-12 months is recommended.  3.Classroom modification to provide an appropriate education will be needed to include:  Laquia has poor word recognition in background noise and may miss information in the classroom.  Using technology such as recording classes or using a smart pen may help, but strategic classroom placement for optimal hearing and recording will also be needed. Strategic placement should be away from noise sources, such as hall or street noise, ventilation fans or overhead projector noise etc.   Allow extended test times for in class and standardized examinations.   Allow Anarosa to take examinations in a quiet area, free from auditory distractions.   Allow access to new information prior to it being presented in class.  Providing notes, powerpoint slides or overhead projector sheets will  be of significant benefit.  4.  Use hearing protection  during noisy activities.  Musician's plugs, are available from [employer] for music related hearing protection because there is no distortion.  Other hearing protection, such as sponge plugs (available at pharmacies) or earmuffs (available at [employer] or [employer]) are useful for noisy activities and venues.  5.  Strategies that help improve hearing include: A) Face the speaker directly. Optimal is having the speakers face well - lit.  Unless amplified, being within 3-6 feet of the speaker will enhance word recognition. B) Avoid having the speaker back-lit as this will minimize the ability to use cues from lip-reading, facial expression and gestures. C)  Word recognition is poorer in background noise. For optimal  word recognition, turn off the TV, radio or noisy fan when engaging in conversation. In a restaurant, try to sit away from noise sources and close to the primary speaker.  D)  Ask for topic clarification from time to time in order to remain in the conversation.  Most people don't mind repeating or clarifying a point when asked.  If needed, explain the difficulty hearing in background noise or hearing loss.   Aaiden Depoy L. Heide Spark, [place], [place] 12/28/2014

## 2014-12-31 ENCOUNTER — Ambulatory Visit (INDEPENDENT_AMBULATORY_CARE_PROVIDER_SITE_OTHER): Payer: Commercial Managed Care - PPO | Admitting: Family Medicine

## 2014-12-31 ENCOUNTER — Encounter: Payer: Self-pay | Admitting: Family Medicine

## 2014-12-31 VITALS — BP 122/82 | HR 80 | Temp 98.0°F | Ht 65.0 in | Wt 201.4 lb

## 2014-12-31 DIAGNOSIS — IMO0002 Reserved for concepts with insufficient information to code with codable children: Secondary | ICD-10-CM

## 2014-12-31 DIAGNOSIS — E108 Type 1 diabetes mellitus with unspecified complications: Secondary | ICD-10-CM | POA: Diagnosis not present

## 2014-12-31 DIAGNOSIS — Z114 Encounter for screening for human immunodeficiency virus [HIV]: Secondary | ICD-10-CM | POA: Diagnosis not present

## 2014-12-31 DIAGNOSIS — E1065 Type 1 diabetes mellitus with hyperglycemia: Secondary | ICD-10-CM

## 2014-12-31 DIAGNOSIS — H9191 Unspecified hearing loss, right ear: Secondary | ICD-10-CM | POA: Diagnosis not present

## 2014-12-31 LAB — BASIC METABOLIC PANEL WITH GFR
BUN: 10 mg/dL (ref 7–25)
CALCIUM: 8.8 mg/dL (ref 8.6–10.2)
CHLORIDE: 101 mmol/L (ref 98–110)
CO2: 28 mmol/L (ref 20–31)
CREATININE: 0.62 mg/dL (ref 0.50–1.10)
GFR, Est African American: >89 mL/min (ref >=60)
GFR, Est Non African American: >89 mL/min (ref >=60)
Glucose, Bld: 227 mg/dL — ABNORMAL HIGH (ref 65–99)
Potassium: 4 mmol/L (ref 3.5–5.3)
SODIUM: 137 mmol/L (ref 135–146)

## 2014-12-31 MED ORDER — INSULIN DETEMIR 100 UNIT/ML ~~LOC~~ SOLN: 15.0000 [IU] | Freq: Two times a day (BID) | SUBCUTANEOUS | Status: DC

## 2014-12-31 NOTE — Progress Notes (Signed)
   Subjective:    Patient ID: [name], female    DOB: [birth year], 44 y.o.   MRN: [id number]  HPI  CC: hospital follow up   # Type 1 diabetes:  Admitted to hospital for DKA in [place], [place] (2am Thanksgiving morning, released on Sunday)  Diet: says she cut out sodas, sweets. Drinking lemon water. Eating frozen grapes for snacks.  Using pump: basal rate varies 1.8 to 2.8 u/hr. Bolus lately 7-10 units.  CBG since leaving hospital mostly in 200s, high of 340  Has endocrine appointment in March ROS: no polyuria/polydipsia, no chest pain, no shortness of breath   # Hearing loss on the right  Seen by audiologist, told she had conductive hearing loss and a few other things she can't remember (?fluid), was told to see ENT  Hearing loss most likely due to noise exposure at choir concerts (she stood with right ear next to loudspeaker)  Social Hx: never smoker  Review of Systems   See HPI for ROS.   Past medical history, surgical, family, and social history reviewed and updated in the EMR as appropriate. Objective:  BP 122/82 mmHg  Pulse 80  Temp(Src) 98 F (36.7 C) (Oral)  Ht 5\' 5"  (1.651 m)  Wt 201 lb 7 oz (91.371 kg)  BMI 33.52 kg/m2 Vitals and nursing note reviewed  General: NAD CV: RRR, normal s1s2, no murmurs, rubs or gallop. 2+ radial pulses bilaterally. Resp: clear to auscultation bilaterally, normal effort Extremities: no LE edema Neuro: alert and oriented Psych: normal thought content and speech  Assessment & Plan:  Type I diabetes mellitus with complication, uncontrolled ([place]) Recent DKA admission. On insulin pump, seems to be She does already have an appointment with a new endocrinologist. Discussed some dietary changes she can continue to make (identified high glycemic index foods she has been eating). BMP was checked and was normal except for blood glucose. Follow up with me in 1 month.  Change in hearing of right ear Seen by audiology whom recommended  ENT referral. I cannot see this note yet in epic. Patient would like to go see ENT at least for initial evaluation, so referral was sent.

## 2015-01-01 LAB — HIV ANTIBODY (ROUTINE TESTING W REFLEX): HIV: NONREACTIVE

## 2015-01-01 NOTE — Assessment & Plan Note (Signed)
Recent DKA admission. On insulin pump, seems to be She does already have an appointment with a new endocrinologist. Discussed some dietary changes she can continue to make (identified high glycemic index foods she has been eating). BMP was checked and was normal except for blood glucose. Follow up with me in 1 month.

## 2015-01-01 NOTE — Assessment & Plan Note (Signed)
Seen by audiology whom recommended ENT referral. I cannot see this note yet in epic. Patient would like to go see ENT at least for initial evaluation, so referral was sent.

## 2015-01-04 ENCOUNTER — Encounter: Payer: Self-pay | Admitting: Family Medicine

## 2015-01-04 NOTE — Telephone Encounter (Signed)
Will forward to PCP for review of wanting to know what lab results were? Anaissa Macfadden, CMA.

## 2015-01-07 ENCOUNTER — Telehealth: Payer: Self-pay | Admitting: Family Medicine

## 2015-01-07 DIAGNOSIS — E1065 Type 1 diabetes mellitus with hyperglycemia: Secondary | ICD-10-CM

## 2015-01-07 DIAGNOSIS — E108 Type 1 diabetes mellitus with unspecified complications: Principal | ICD-10-CM

## 2015-01-07 DIAGNOSIS — IMO0002 Reserved for concepts with insufficient information to code with codable children: Secondary | ICD-10-CM

## 2015-01-07 MED ORDER — ANIMAS VIBE INSULIN PUMP KIT: 1.0000 | PACK | Status: DC

## 2015-01-07 NOTE — Telephone Encounter (Signed)
Spoke with patient and she is needing supplies for her animas insulin pump. She states that she is "in-between" endocrinologist and waiting for an appt with Dr. Buddy Duty.  States pcp offered to manage her diabetes until she could get her appt.  Her customer number/acct number for [employer] is [phone].  She has already set her acct and ordered her supplies but needs a signature from [employer] certified provider.  Advised patient that we can fax over a generic script with this signature for her supplies.  Fax to [phone].  Informed patient that I would call her once this has been done and then she would need to follow up with them in a few days to make sure that they received the information and nothing else is needed from [place].

## 2015-01-07 NOTE — Telephone Encounter (Signed)
Pt calling to speak with a member of the clinical staff. She states that she is having trouble getting her medical supplies, as her provider is not [employer] enrolled. She states that she has been without these supplies for months. Wasn't aware of issue until she called to ask what was taking so long. I gave her a couple of faculty names to try. Please contact pt at earliest convenience if there is anyway that we could expedite process. Sadie Reynolds, ASA

## 2015-02-03 ENCOUNTER — Other Ambulatory Visit: Payer: Self-pay

## 2015-02-03 ENCOUNTER — Other Ambulatory Visit: Payer: Self-pay | Admitting: Hematology and Oncology

## 2015-02-03 DIAGNOSIS — Z1231 Encounter for screening mammogram for malignant neoplasm of breast: Secondary | ICD-10-CM

## 2015-02-16 ENCOUNTER — Ambulatory Visit
Admission: RE | Admit: 2015-02-16 | Discharge: 2015-02-16 | Disposition: A | Discharge status: Home / Self Care | Payer: Commercial Managed Care - PPO | Source: Ambulatory Visit

## 2015-02-16 DIAGNOSIS — Z1231 Encounter for screening mammogram for malignant neoplasm of breast: Secondary | ICD-10-CM

## 2015-03-06 ENCOUNTER — Other Ambulatory Visit: Payer: Self-pay | Admitting: Hematology and Oncology

## 2015-03-08 ENCOUNTER — Other Ambulatory Visit: Payer: Self-pay | Admitting: *Deleted

## 2015-03-08 DIAGNOSIS — C50912 Malignant neoplasm of unspecified site of left female breast: Secondary | ICD-10-CM

## 2015-03-08 MED ORDER — ERGOCALCIFEROL 1.25 MG (50000 UT) PO CAPS: 50000.0000 [IU] | ORAL_CAPSULE | ORAL | Status: DC

## 2015-03-31 ENCOUNTER — Inpatient Hospital Stay (HOSPITAL_COMMUNITY)
Admission: EM | Admit: 2015-03-31 | Discharge: 2015-04-02 | DRG: 638 | Disposition: A | Discharge status: Home / Self Care | Payer: Commercial Managed Care - PPO | Attending: Family Medicine | Admitting: Family Medicine

## 2015-03-31 ENCOUNTER — Encounter (HOSPITAL_COMMUNITY): Payer: Self-pay | Admitting: *Deleted

## 2015-03-31 ENCOUNTER — Emergency Department (HOSPITAL_COMMUNITY): Payer: Commercial Managed Care - PPO

## 2015-03-31 DIAGNOSIS — E049 Nontoxic goiter, unspecified: Secondary | ICD-10-CM | POA: Diagnosis present

## 2015-03-31 DIAGNOSIS — R05 Cough: Secondary | ICD-10-CM | POA: Diagnosis not present

## 2015-03-31 DIAGNOSIS — E785 Hyperlipidemia, unspecified: Secondary | ICD-10-CM | POA: Diagnosis present

## 2015-03-31 DIAGNOSIS — I10 Essential (primary) hypertension: Secondary | ICD-10-CM | POA: Diagnosis present

## 2015-03-31 DIAGNOSIS — E860 Dehydration: Secondary | ICD-10-CM | POA: Diagnosis present

## 2015-03-31 DIAGNOSIS — E1010 Type 1 diabetes mellitus with ketoacidosis without coma: Secondary | ICD-10-CM | POA: Diagnosis not present

## 2015-03-31 DIAGNOSIS — Z794 Long term (current) use of insulin: Secondary | ICD-10-CM

## 2015-03-31 DIAGNOSIS — Z86718 Personal history of other venous thrombosis and embolism: Secondary | ICD-10-CM

## 2015-03-31 DIAGNOSIS — F329 Major depressive disorder, single episode, unspecified: Secondary | ICD-10-CM | POA: Diagnosis present

## 2015-03-31 DIAGNOSIS — Z9641 Presence of insulin pump (external) (internal): Secondary | ICD-10-CM | POA: Diagnosis present

## 2015-03-31 DIAGNOSIS — Z853 Personal history of malignant neoplasm of breast: Secondary | ICD-10-CM

## 2015-03-31 DIAGNOSIS — R1030 Lower abdominal pain, unspecified: Secondary | ICD-10-CM | POA: Diagnosis not present

## 2015-03-31 DIAGNOSIS — N179 Acute kidney failure, unspecified: Secondary | ICD-10-CM | POA: Insufficient documentation

## 2015-03-31 DIAGNOSIS — R0602 Shortness of breath: Secondary | ICD-10-CM | POA: Diagnosis not present

## 2015-03-31 LAB — BASIC METABOLIC PANEL
ANION GAP: 22 — AB (ref 5–15)
ANION GAP: 14 (ref 5–15)
ANION GAP: 15 (ref 5–15)
BUN: 20 mg/dL (ref 6–20)
BUN: 17 mg/dL (ref 6–20)
BUN: 14 mg/dL (ref 6–20)
CALCIUM: 9 mg/dL (ref 8.9–10.3)
CHLORIDE: 96 mmol/L — AB (ref 101–111)
CO2: 17 mmol/L — AB (ref 22–32)
CO2: 18 mmol/L — ABNORMAL LOW (ref 22–32)
CO2: 20 mmol/L — ABNORMAL LOW (ref 22–32)
Calcium: 10.3 mg/dL (ref 8.9–10.3)
Calcium: 9.4 mg/dL (ref 8.9–10.3)
Chloride: 105 mmol/L (ref 101–111)
Chloride: 104 mmol/L (ref 101–111)
Creatinine, Ser: 1.26 mg/dL — ABNORMAL HIGH (ref 0.44–1.00)
Creatinine, Ser: 1 mg/dL (ref 0.44–1.00)
Creatinine, Ser: 0.84 mg/dL (ref 0.44–1.00)
GFR calc Af Amer: >60 mL/min (ref >60)
GFR calc non Af Amer: 50 mL/min — ABNORMAL LOW (ref >60)
GFR, EST AFRICAN AMERICAN: 58 mL/min — AB (ref >60)
Glucose, Bld: 565 mg/dL (ref 65–99)
Glucose, Bld: 164 mg/dL — ABNORMAL HIGH (ref 65–99)
Glucose, Bld: 142 mg/dL — ABNORMAL HIGH (ref 65–99)
POTASSIUM: 5 mmol/L (ref 3.5–5.1)
POTASSIUM: 4.3 mmol/L (ref 3.5–5.1)
POTASSIUM: 4.3 mmol/L (ref 3.5–5.1)
SODIUM: 135 mmol/L (ref 135–145)
SODIUM: 137 mmol/L (ref 135–145)
SODIUM: 139 mmol/L (ref 135–145)

## 2015-03-31 LAB — GLUCOSE, CAPILLARY
GLUCOSE-CAPILLARY: 311 mg/dL — AB (ref 65–99)
GLUCOSE-CAPILLARY: 201 mg/dL — AB (ref 65–99)
GLUCOSE-CAPILLARY: 214 mg/dL — AB (ref 65–99)
GLUCOSE-CAPILLARY: 109 mg/dL — AB (ref 65–99)
GLUCOSE-CAPILLARY: 96 mg/dL (ref 65–99)
GLUCOSE-CAPILLARY: 91 mg/dL (ref 65–99)
GLUCOSE-CAPILLARY: 122 mg/dL — AB (ref 65–99)
Glucose-Capillary: 280 mg/dL — ABNORMAL HIGH (ref 65–99)
Glucose-Capillary: 167 mg/dL — ABNORMAL HIGH (ref 65–99)
Glucose-Capillary: 154 mg/dL — ABNORMAL HIGH (ref 65–99)

## 2015-03-31 LAB — URINALYSIS, ROUTINE W REFLEX MICROSCOPIC
Bilirubin Urine: NEGATIVE
Hgb urine dipstick: NEGATIVE
Ketones, ur: >80 mg/dL — AB
LEUKOCYTES UA: NEGATIVE
Nitrite: NEGATIVE
PROTEIN: NEGATIVE mg/dL
Specific Gravity, Urine: 1.03 (ref 1.005–1.030)
pH: 5 (ref 5.0–8.0)

## 2015-03-31 LAB — MRSA PCR SCREENING: MRSA BY PCR: NEGATIVE

## 2015-03-31 LAB — CBC
HEMATOCRIT: 39.5 % (ref 36.0–46.0)
HEMOGLOBIN: 13.9 g/dL (ref 12.0–15.0)
MCH: 29.4 pg (ref 26.0–34.0)
MCHC: 35.2 g/dL (ref 30.0–36.0)
MCV: 83.5 fL (ref 78.0–100.0)
Platelets: 264 10*3/uL (ref 150–400)
RBC: 4.73 MIL/uL (ref 3.87–5.11)
RDW: 12.8 % (ref 11.5–15.5)
WBC: 6.9 10*3/uL (ref 4.0–10.5)

## 2015-03-31 LAB — HEPATIC FUNCTION PANEL
ALBUMIN: 4.2 g/dL (ref 3.5–5.0)
ALK PHOS: 210 U/L — AB (ref 38–126)
ALT: 41 U/L (ref 14–54)
AST: 21 U/L (ref 15–41)
Bilirubin, Direct: 0.3 mg/dL (ref 0.1–0.5)
Indirect Bilirubin: 1.5 mg/dL — ABNORMAL HIGH (ref 0.3–0.9)
TOTAL PROTEIN: 7.8 g/dL (ref 6.5–8.1)
Total Bilirubin: 1.8 mg/dL — ABNORMAL HIGH (ref 0.3–1.2)

## 2015-03-31 LAB — LIPASE, BLOOD: LIPASE: 18 U/L (ref 11–51)

## 2015-03-31 LAB — I-STAT TROPONIN, ED: Troponin i, poc: 0 ng/mL (ref 0.00–0.08)

## 2015-03-31 LAB — URINE MICROSCOPIC-ADD ON
RBC / HPF: NONE SEEN RBC/hpf (ref 0–5)
WBC UA: NONE SEEN WBC/hpf (ref 0–5)

## 2015-03-31 LAB — POC URINE PREG, ED: PREG TEST UR: NEGATIVE

## 2015-03-31 LAB — CBG MONITORING, ED
GLUCOSE-CAPILLARY: 351 mg/dL — AB (ref 65–99)
Glucose-Capillary: 501 mg/dL — ABNORMAL HIGH (ref 65–99)

## 2015-03-31 LAB — TSH: TSH: 0.988 u[IU]/mL (ref 0.350–4.500)

## 2015-03-31 MED ORDER — SODIUM CHLORIDE 0.9 % IV SOLN
INTRAVENOUS | Status: DC
  Administered 2015-03-31: 4.4 [IU]/h via INTRAVENOUS
  Filled 2015-03-31: qty 2.5

## 2015-03-31 MED ORDER — POTASSIUM CHLORIDE 10 MEQ/100ML IV SOLN
10.0000 meq | INTRAVENOUS | Status: AC
  Administered 2015-03-31 (×2): 10 meq via INTRAVENOUS
  Filled 2015-03-31 (×2): qty 100

## 2015-03-31 MED ORDER — LISINOPRIL 10 MG PO TABS
10.0000 mg | ORAL_TABLET | Freq: Every day | ORAL | Status: DC
  Administered 2015-03-31: 10 mg via ORAL
  Filled 2015-03-31: qty 1

## 2015-03-31 MED ORDER — VENLAFAXINE HCL 37.5 MG PO TABS
37.5000 mg | ORAL_TABLET | Freq: Two times a day (BID) | ORAL | Status: DC
  Administered 2015-03-31 – 2015-04-02 (×5): 37.5 mg via ORAL
  Filled 2015-03-31 (×6): qty 1

## 2015-03-31 MED ORDER — ONDANSETRON HCL 4 MG/2ML IJ SOLN
4.0000 mg | Freq: Once | INTRAMUSCULAR | Status: AC
  Administered 2015-03-31: 4 mg via INTRAVENOUS
  Filled 2015-03-31: qty 2

## 2015-03-31 MED ORDER — HYDRALAZINE HCL 20 MG/ML IJ SOLN: 5.0000 mg | INTRAMUSCULAR | Status: DC | PRN

## 2015-03-31 MED ORDER — SODIUM CHLORIDE 0.9 % IV BOLUS (SEPSIS)
2000.0000 mL | Freq: Once | INTRAVENOUS | Status: AC
  Administered 2015-03-31: 2000 mL via INTRAVENOUS

## 2015-03-31 MED ORDER — SODIUM CHLORIDE 0.9 % IV SOLN
INTRAVENOUS | Status: DC
  Administered 2015-03-31: 0.6 [IU]/h via INTRAVENOUS
  Administered 2015-03-31: 0.9 [IU]/h via INTRAVENOUS
  Filled 2015-03-31: qty 2.5

## 2015-03-31 MED ORDER — SODIUM CHLORIDE 0.9 % IV SOLN: INTRAVENOUS | Status: DC

## 2015-03-31 MED ORDER — MORPHINE SULFATE (PF) 4 MG/ML IV SOLN
4.0000 mg | Freq: Once | INTRAVENOUS | Status: AC
Start: 2015-03-31 — End: 2015-03-31
  Administered 2015-03-31: 4 mg via INTRAVENOUS
  Filled 2015-03-31: qty 1

## 2015-03-31 MED ORDER — SODIUM CHLORIDE 0.9 % IV SOLN
INTRAVENOUS | Status: AC
  Administered 2015-03-31: 14:00:00 via INTRAVENOUS

## 2015-03-31 MED ORDER — ENOXAPARIN SODIUM 40 MG/0.4ML ~~LOC~~ SOLN
40.0000 mg | SUBCUTANEOUS | Status: DC
  Administered 2015-03-31 – 2015-04-01 (×2): 40 mg via SUBCUTANEOUS
  Filled 2015-03-31 (×3): qty 0.4

## 2015-03-31 MED ORDER — IOHEXOL 300 MG/ML  SOLN: 100.0000 mL | Freq: Once | INTRAMUSCULAR | Status: DC | PRN

## 2015-03-31 MED ORDER — DEXTROSE-NACL 5-0.45 % IV SOLN
INTRAVENOUS | Status: DC
  Administered 2015-03-31 – 2015-04-01 (×3): via INTRAVENOUS

## 2015-03-31 NOTE — ED Provider Notes (Signed)
CSN: [id number]     Arrival date & time 03/31/15  0388 History   First MD Initiated Contact with Patient 03/31/15 0945     Chief Complaint  Patient presents with  . Shortness of Breath  . Abdominal Pain     (Consider location/radiation/quality/duration/timing/severity/associated sxs/prior Treatment) The history is provided by the patient.  [name] is a 45 y.o. female hx of type 1 diabetes on insulin pump, breast cancer in remission, HL, here with nausea, abdominal pain, shortness of breath. Patient states that she had shortness of breath and chest pain since yesterday. Also has epigastric pain as well. She hasn't nausea but no vomiting. She states that this is very typical when she gets into DKA. Denies any fevers at home. Patient took her blood sugar and it was 489 this morning. Patient adjusted her insulin pump but still feeling nauseated.    Past Medical History  Diagnosis Date  . Breast mass in female     fibrocystic changes  . Hyperlipidemia   . Nausea   . Hypertension   . Nausea & vomiting     for approx. 2 weeks  . Cataracts, bilateral   . Diabetes mellitus     insulin dependant - uses pump, Dr. Dwyane Dee  . Breast cancer ([place])     T2N2 tiple negative left breast ca   . S/P chemotherapy, time since 4-12 weeks   . History of chemotherapy     taxotere/cytoxan with neulasta support  . Lymphedema of arm     left upper extremity  . Blood clot in vein     nonocclusive clot right subclavian vein, on Xarelto for 6 months  . Allergy   . Breast cancer ([place])   . Radiation 08/21/11-10/05/11    Left chestwll/Supraclav./left PAB/left scar   Past Surgical History  Procedure Laterality Date  . Bladder tacking  01/2009  . Shoulder surgery  04/26/10    left  . Tubal ligation  12/1999  . Left mastectomy  02/21/2011  . Portacath placement  02/21/2011    Procedure: INSERTION PORT-A-CATH;  Surgeon: Adin Hector, MD;  Location: [facility];  Service: General;  Laterality: N/A;  .  Mastectomy modified radical  02/21/11    left,invasive  multifocal grade III ductal ca,high grade dcis,lymph/vascular invasion invasion (7/14) nodes positive mets  . Port-a-cath removal  10/13/2011    Procedure: MINOR REMOVAL PORT-A-CATH;  Surgeon: Adin Hector, MD;  Location: [facility];  Service: General;  Laterality: Right;  PORT-A CATH REMOVAL  . Mass excision  02/19/2012    Procedure: MINOR EXCISION OF MASS;  Surgeon: Adin Hector, MD;  Location: [facility];  Service: General;  Laterality: Left;  . Abdominal hysterectomy  2002    still has ovaries    Family History  Problem Relation Age of Onset  . Hypertension    . Diabetes    . Deep vein thrombosis    . Kidney disease    . Cancer Maternal Aunt     breast  . Cancer Maternal Uncle 50     right eye removed s/p cancer  . Cancer Maternal Grandfather     prostate ca   Social History  Substance Use Topics  . Smoking status: Never Smoker   . Smokeless tobacco: Never Used  . Alcohol Use: No   OB History    Obstetric Comments   G4P3, menarche age 87, and menopause at time of hysterectomy; no hx of hormone replacement therapy  Review of Systems  Respiratory: Positive for shortness of breath.   Cardiovascular: Positive for chest pain.  Gastrointestinal: Positive for nausea and abdominal pain.  All other systems reviewed and are negative.     Allergies  Hydrocodone-acetaminophen; Insulin glargine; and Nph iletin i  Home Medications   Prior to Admission medications   Medication Sig Start Date End Date Taking? Authorizing Provider  acetaminophen (TYLENOL) 500 MG tablet Take 1 tablet (500 mg total) by mouth every 6 (six) hours as needed for pain. 08/19/12   Leone Brand, MD  atorvastatin (LIPITOR) 40 MG tablet Take 1 tablet (40 mg total) by mouth daily. 10/29/14   Leone Brand, MD  cetirizine (ZYRTEC) 10 MG tablet Take 1 tablet (10 mg total) by mouth daily. Patient taking differently:  Take 10 mg by mouth daily as needed for allergies.  08/30/12   Kandis Nab, MD  Chromium Picolinate 500 MCG CAPS Take 500 mcg by mouth daily.    Historical Provider, MD  dicyclomine (BENTYL) 20 MG tablet Take 1 tablet (20 mg total) by mouth 3 (three) times daily before meals. As needed for abdominal cramping 11/30/14   Kristen N Ward, DO  ergocalciferol (VITAMIN D2) 50000 units capsule Take 1 capsule (50,000 Units total) by mouth once a week. 03/08/15   Nicholas Lose, MD  Nyoka Cowden Coffee Bean 400 MG CAPS Take 400 mg by mouth 3 (three) times daily.    Historical Provider, MD  insulin aspart (NOVOLOG) 100 UNIT/ML injection USE MAX 100 UNITS PER DAY WITH PUMP 11/09/14   Leone Brand, MD  insulin detemir (LEVEMIR) 100 UNIT/ML injection Inject 0.15 mLs (15 Units total) into the skin 2 (two) times daily. 12/31/14   Leone Brand, MD  Insulin Human (INSULIN PUMP) 100 unit/ml SOLN Inject 1 each into the skin continuous. Novolog insulin    Historical Provider, MD  Insulin Infusion Pump (ANIMAS VIBE INSULIN PUMP) KIT 1 kit by Subdermal route continuous. Supplies as ordered by account [id number]. ICD10 E10.8, E10.65 01/07/15   Lind Covert, MD  lisinopril (PRINIVIL,ZESTRIL) 10 MG tablet TAKE ONE TABLET BY MOUTH IN THE MORNING FOR  BLOOD  PRESSURE 04/17/14   Leone Brand, MD  loperamide (IMODIUM) 2 MG capsule Take 1 capsule (2 mg total) by mouth 4 (four) times daily as needed for diarrhea or loose stools. 11/30/14   Kristen N Ward, DO  ondansetron (ZOFRAN ODT) 4 MG disintegrating tablet Take 1 tablet (4 mg total) by mouth every 8 (eight) hours as needed for nausea or vomiting. 11/30/14   Kristen N Ward, DO  ranitidine (ZANTAC) 150 MG tablet Take 1 tablet (150 mg total) by mouth 2 (two) times daily. Patient taking differently: Take 150 mg by mouth 2 (two) times daily as needed for heartburn.  08/30/12   Kandis Nab, MD  venlafaxine (EFFEXOR) 37.5 MG tablet TAKE ONE TABLET BY MOUTH TWICE DAILY 02/05/14    Nicholas Lose, MD   BP 121/74 mmHg  Pulse 100  Temp(Src) 98.2 F (36.8 C) (Oral)  Resp 16  SpO2 98% Physical Exam  Constitutional: She is oriented to person, place, and time.  Chronically ill, dehydrated   HENT:  Head: Normocephalic.  MM dry   Eyes: Conjunctivae are normal. Pupils are equal, round, and reactive to light.  Neck: Normal range of motion. Neck supple.  Cardiovascular: Regular rhythm and normal heart sounds.   Slightly tachycardic   Pulmonary/Chest: Effort normal and breath sounds normal. No respiratory distress. She has  no wheezes. She has no rales.  Abdominal: Soft. Bowel sounds are normal.  Mild epigastric tenderness, no rebound. Insulin pump site with no cellulitis or tenderness   Musculoskeletal: Normal range of motion. She exhibits no edema or tenderness.  Neurological: She is alert and oriented to person, place, and time.  Skin: Skin is warm and dry.  Psychiatric: She has a normal mood and affect. Her behavior is normal. Judgment and thought content normal.  Nursing note and vitals reviewed.   ED Course  Procedures (including critical care time)  CRITICAL CARE Performed by: Darl Householder, Ishmeal Rorie   Total critical care time: 30 minutes  Critical care time was exclusive of separately billable procedures and treating other patients.  Critical care was necessary to treat or prevent imminent or life-threatening deterioration.  Critical care was time spent personally by me on the following activities: development of treatment plan with patient and/or surrogate as well as nursing, discussions with consultants, evaluation of patient's response to treatment, examination of patient, obtaining history from patient or surrogate, ordering and performing treatments and interventions, ordering and review of laboratory studies, ordering and review of radiographic studies, pulse oximetry and re-evaluation of patient's condition.   Labs Review Labs Reviewed  BASIC METABOLIC PANEL -  Abnormal; Notable for the following:    Chloride 96 (*)    CO2 17 (*)    Glucose, Bld 565 (*)    Creatinine, Ser 1.26 (*)    GFR calc non Af Amer 50 (*)    GFR calc Af Amer 58 (*)    Anion gap 22 (*)    All other components within normal limits  HEPATIC FUNCTION PANEL - Abnormal; Notable for the following:    Alkaline Phosphatase 210 (*)    Total Bilirubin 1.8 (*)    Indirect Bilirubin 1.5 (*)    All other components within normal limits  URINALYSIS, ROUTINE W REFLEX MICROSCOPIC (NOT AT [facility]) - Abnormal; Notable for the following:    Glucose, UA >1000 (*)    Ketones, ur >80 (*)    All other components within normal limits  URINE MICROSCOPIC-ADD ON - Abnormal; Notable for the following:    Squamous Epithelial / LPF 0-5 (*)    Bacteria, UA RARE (*)    All other components within normal limits  CBG MONITORING, ED - Abnormal; Notable for the following:    Glucose-Capillary 501 (*)    All other components within normal limits  CBG MONITORING, ED - Abnormal; Notable for the following:    Glucose-Capillary 351 (*)    All other components within normal limits  CBC  LIPASE, BLOOD  PREGNANCY, URINE  I-STAT TROPOININ, ED  POC URINE PREG, ED    Imaging Review Dg Chest 2 View  03/31/2015  CLINICAL DATA:  Shortness of breath, cough EXAM: CHEST  2 VIEW COMPARISON:  04/25/2013 FINDINGS: Prior left mastectomy and left axillary nodal dissection. Heart and mediastinal contours are within normal limits. No focal opacities or effusions. No acute bony abnormality. IMPRESSION: No active cardiopulmonary disease. Electronically Signed   By: Rolm Baptise M.D.   On: 03/31/2015 09:33   Ct Abdomen Pelvis W Contrast  03/31/2015  CLINICAL DATA:  Nausea, lower abdominal pain. EXAM: CT ABDOMEN AND PELVIS WITH CONTRAST TECHNIQUE: Multidetector CT imaging of the abdomen and pelvis was performed using the standard protocol following bolus administration of intravenous contrast. CONTRAST:  75 cc Omnipaque 300 IV  COMPARISON:  02/16/2014 FINDINGS: Lower chest: Changes of left mastectomy partially imaged. Lung bases  are clear. No effusions. Heart is normal size. Hepatobiliary: No focal hepatic abnormality. Gallbladder unremarkable. Pancreas: Unremarkable Spleen: Unremarkable Adrenals/Urinary Tract: Unremarkable.  No hydronephrosis or stones. Stomach/Bowel: Appendix is normal. Stomach, large and small bowel unremarkable. Vascular/Lymphatic: No evidence of aneurysm or adenopathy. Other: Uterus, adnexae a unremarkable.  No free fluid, free air. Musculoskeletal: No acute bony abnormality or focal bone lesion. IMPRESSION: No acute findings in the abdomen or pelvis. Electronically Signed   By: Rolm Baptise M.D.   On: 03/31/2015 12:16   I have personally reviewed and evaluated these images and lab results as part of my medical decision-making.   EKG Interpretation   Date/Time:  Wednesday March 31 2015 09:03:55 EST Ventricular Rate:  116 PR Interval:  130 QRS Duration: 76 QT Interval:  318 QTC Calculation: 442 R Axis:   26 Text Interpretation:  Sinus tachycardia Right atrial enlargement  Borderline ECG tachycardia new since previous  Confirmed by Keandrea Tapley  MD, Shafter Jupin  ([id number]) on 03/31/2015 9:45:33 AM      MDM   Final diagnoses:  None   Karyl S Halley is a 45 y.o. female here with nausea, shortness of breath, abdominal pain. States that nausea and shortness of breath typical of her DKA. However, abdominal pain was unusual. Patient was admitted around Thanksgiving for DKA. She does have hx of blood clot in subclavian from a port and finished xarelto. She no longer has a port and has no leg pain or swelling. I think the tachycardia and shortness of breath likely from DKA and not PE. Will get labs, CT ab/pel, CXR. Will hydrate and start insulin drip.   12:22 PM UA + ketones. Bicarb 17 and AG 22 with glucose 500. Patient in DKA. Insulin drip started. CT ab/pel unremarkable. Tachycardia improved with 2 L NS bolus. Will  admit to family practice for DKA.     Wandra Arthurs, MD 03/31/15 [phone]

## 2015-03-31 NOTE — ED Notes (Signed)
Pt removed insulin pump per order from Dr. Darl Householder

## 2015-03-31 NOTE — ED Notes (Signed)
Pt reports onset yesterday of high cbg. Onset today of sob, denies productive cough. Airway intact at triage.

## 2015-03-31 NOTE — Progress Notes (Signed)
Notified phlebotomy that pt's bmp is due and pt is NOT a nurse draw.  Stated, "will come draw"

## 2015-03-31 NOTE — H&P (Signed)
FMTS Attending Admission Note: Annabell Sabal MD Personal pager:  [phone] FPTS Service Pager:  [phone]  45 year old female with type 1 diabetes admitted for DKA. She has noted increasing CBGs at home. She is treated with an insulin pump. She was dipping to bolus herself at home yesterday but her meter continued register high. She began have some malaise she came to the emergency department this morning.  Plan to be in DKA and therefore family practice teaching services called for admission. On my exam this afternoon she is lying in bed, pleasant, conversant, oriented. Heart minimally tachycardic but regular rhythm. Lungs clear. Abdomen minimally tender to palpation epigastrium. Extremities without lower extremity edema. No focal neuro deficits. Distal pulses are intact bilaterally.  Last time she was in DKA was Thanksgiving but before that has been several years.  Impression/plan: 1. DKA: -Agree with insulin drip. Agree with IV fluids. -Bmet every 2 hours as per resident note. -Hold food until gap closes. - Unclear etiology. She has no fevers, chills, rhinorrhea, cough, evidence of skin infection. No chest pain.  #2. Hypertension: -Hold lisinopril secondary to a cat and fact she is dehydrated.  #3. A CAD: -Secondary to dehydration most likely. -Continue fluids.  I will sign resident note when complete.   Alveda Reasons, MD 03/31/2015 4:46 PM

## 2015-03-31 NOTE — Progress Notes (Signed)
Utilization Review Completed.Donne Anon T3/01/2015

## 2015-03-31 NOTE — Progress Notes (Signed)
Pt arrived to rm 2h24 at this time.  No s/s of any acute distress.  Denies pain.

## 2015-03-31 NOTE — H&P (Signed)
[facility] Admission History and Physical Service Pager: [phone]  Patient name: [name] Medical record number: [id number] Date of birth: May 27, 1970 Age: 45 y.o. Gender: female  Primary Care Provider: Tawanna Sat, MD Consultants: none Code Status: FULL  Chief Complaint: elevated glucose, nausea, abdominal pain   Assessment and Plan: [name] is a 45 y.o. female presenting with feeling poorly and elevated blood glucose. PMH is significant for T1DM, HTN, hx breast cancer with left breast mastectomy, HLD  DKA with T1DM: Likely due to a change in insulin requirement as patient has not been seen by endo and has not had any changes in her pump settings. No signs or symptoms of infection: afebrile with a normal WBC. EKG wnl and troponin negative.  CBG 565. Bicarb 17. AG 22. UA: >80 ketones, >1000 glucose.  - admit to teaching service, stepdown unit, attending Dr. Mingo Amber  - CBGs q 1 hr, BMP q 2 hrs until anion gap closed x 2.  - NS IVF _0 /hr per protocol, then D5NS @ 15m/hr when CBG <250 - Insulin drip  - Last A1c 11.0 in 11/09/14, will repeat A1c - consider consulting endocrinology while inpatient to assist with new pump setting   Asymmetric, enlarged thyroid:  Last TSH was 0.85 - Will get a TSH.  HTN: Elevated BP with SBP in 140s-150s. Home meds: Lisinopril  - received Lisinopril x 1 today on admission due to elevated BP, will hold for now due to AKI - Hydralazine PRN   AKI: Cr 1.26 (Baseline 0.6-0.9) Likely due to dehydration.  - IVF with DKA protocol   Depression: -continue Effexor   FEN/GI: NPO per DKA protocol.  Prophylaxis: Lovenox Sub Q  Disposition: admit to stepdown for management of DKA  History of Present Illness:  Terri RIPLEYis a 45y.o. female presenting with feeling poorly over the last 1-2 days with elevated blood sugars (one reading was "high" otherwise 400-500s)  She notes that she started feeling nauseated  yesterday. When her CBGs were elevated she started giving herself insulin boluses and increasing her water intake which temporarily improved her CBGs.   She started having SOB that began suddenly this morning that lasted until she got here. Her heart was beating fast during this time as well. She had generalized abdominal pain that started this morning. She took her insulin this morning. She normally boluses 4-5 units with each meal, now she's been doing 9-10 units with each meal. She started this insulin pump about 3 days ago but feels it is working because it was taking down her blood sugars. She hasn't been to her endocrinologist in sometime- she's transferring and will see Dr. JMinette Brine She last saw an endocrinologist in 08/2013 per chart review. She has not changed any settings on her pump since she was seen by endocrinology. Her current basal pump settings: Midnight = 1.9, 4 AM = 2.3-2.4,. 9 AM = 2.3,. 12 noon = 2.1 , 4 PM = 2.8 and 10 PM = 1.8  She denies recent illness. No cough, rhinorrhea, diarrhea, constipation, or fevers. Denies chest pain.   Last [place] in [place] in [place] at that time her basal insulin was not changed.  Patient received  2L bolus in ED. Ct abdomen obtained due to abdominal pain, which was unremarkable.   Review Of Systems: Per HPI Otherwise the remainder of the systems were negative.  Patient Active Problem List   Diagnosis Date Noted  . DKA, type 1 (  [place]) 03/31/2015  . Vitamin D deficiency disease 11/23/2014  . Change in hearing of right ear 11/09/2014  . Acute upper respiratory infections of unspecified site 04/25/2013  . Essential hypertension 12/04/2012  . Cough 09/01/2012  . Other and unspecified ovarian cyst 08/12/2012  . Pap smear for cervical cancer screening 08/12/2012  . Diabetic retinopathy ([place]) 07/12/2012  . Jaw pain 06/26/2012  . Mass of left side of neck 02/06/2012  . Breast carcinoma metastatic, skin 05/08/2011  . Cancer of left  breast ([place]) 01/19/2011  . Patellofemoral syndrome, Right 03/10/2010  . THORACIC SOMATIC DYSFUNCTION 02/15/2010  . SOMATIC DYSFUNCTION, RIB CAGE 02/15/2010  . ROTATOR CUFF INJURY, LEFT SHOULDER 11/09/2009  . HOT FLASHES 06/14/2007  . Type I diabetes mellitus with complication, uncontrolled ([place]) 11/16/2006  . HYPERLIPIDEMIA 11/16/2006  . DEPRESSION, MAJOR 11/16/2006    Past Medical History: Past Medical History  Diagnosis Date  . Breast mass in female     fibrocystic changes  . Hyperlipidemia   . Nausea   . Hypertension   . Nausea & vomiting     for approx. 2 weeks  . Cataracts, bilateral   . Diabetes mellitus     insulin dependant - uses pump, Dr. Dwyane Dee  . Breast cancer ([place])     T2N2 tiple negative left breast ca   . S/P chemotherapy, time since 4-12 weeks   . History of chemotherapy     taxotere/cytoxan with neulasta support  . Lymphedema of arm     left upper extremity  . Blood clot in vein     nonocclusive clot right subclavian vein, on Xarelto for 6 months  . Allergy   . Breast cancer ([place])   . Radiation 08/21/11-10/05/11    Left chestwll/Supraclav./left PAB/left scar    Past Surgical History: Past Surgical History  Procedure Laterality Date  . Bladder tacking  01/2009  . Shoulder surgery  04/26/10    left  . Tubal ligation  12/1999  . Left mastectomy  02/21/2011  . Portacath placement  02/21/2011    Procedure: INSERTION PORT-A-CATH;  Surgeon: Adin Hector, MD;  Location: [facility];  Service: General;  Laterality: N/A;  . Mastectomy modified radical  02/21/11    left,invasive  multifocal grade III ductal ca,high grade dcis,lymph/vascular invasion invasion (7/14) nodes positive mets  . Port-a-cath removal  10/13/2011    Procedure: MINOR REMOVAL PORT-A-CATH;  Surgeon: Adin Hector, MD;  Location: [facility];  Service: General;  Laterality: Right;  PORT-A CATH REMOVAL  . Mass excision  02/19/2012    Procedure: MINOR EXCISION OF MASS;  Surgeon:  Adin Hector, MD;  Location: [facility];  Service: General;  Laterality: Left;  . Abdominal hysterectomy  2002    still has ovaries     Social History: Social History  Substance Use Topics  . Smoking status: Never Smoker   . Smokeless tobacco: Never Used  . Alcohol Use: No   Please also refer to relevant sections of EMR. Married. Attends church   Family History: Family History  Problem Relation Age of Onset  . Hypertension    . Diabetes    . Deep vein thrombosis    . Kidney disease    . Cancer Maternal Aunt     breast  . Cancer Maternal Uncle 50     right eye removed s/p cancer  . Cancer Maternal Grandfather     prostate ca    Allergies and Medications: Allergies  Allergen Reactions  .  Hydrocodone-Acetaminophen Itching and Swelling  . Insulin Glargine Itching, Nausea And Vomiting and Swelling  . Nph Iletin I [Insulin Isophane, Mixed Nph] Itching, Nausea And Vomiting and Swelling   No current facility-administered medications on file prior to encounter.   Current Outpatient Prescriptions on File Prior to Encounter  Medication Sig Dispense Refill  . acetaminophen (TYLENOL) 500 MG tablet Take 1 tablet (500 mg total) by mouth every 6 (six) hours as needed for pain. 30 tablet 0  . atorvastatin (LIPITOR) 40 MG tablet Take 1 tablet (40 mg total) by mouth daily. 30 tablet 3  . cetirizine (ZYRTEC) 10 MG tablet Take 1 tablet (10 mg total) by mouth daily. (Patient taking differently: Take 10 mg by mouth daily as needed for allergies. ) 30 tablet 11  . Chromium Picolinate 500 MCG CAPS Take 500 mcg by mouth daily.    Marland Kitchen dicyclomine (BENTYL) 20 MG tablet Take 1 tablet (20 mg total) by mouth 3 (three) times daily before meals. As needed for abdominal cramping 20 tablet 0  . ergocalciferol (VITAMIN D2) 50000 units capsule Take 1 capsule (50,000 Units total) by mouth once a week. 12 capsule 3  . Green Coffee Bean 400 MG CAPS Take 400 mg by mouth 3 (three) times daily.     . insulin aspart (NOVOLOG) 100 UNIT/ML injection USE MAX 100 UNITS PER DAY WITH PUMP 40 mL 3  . insulin detemir (LEVEMIR) 100 UNIT/ML injection Inject 0.15 mLs (15 Units total) into the skin 2 (two) times daily. 10 mL 0  . Insulin Human (INSULIN PUMP) 100 unit/ml SOLN Inject 1 each into the skin continuous. Novolog insulin    . Insulin Infusion Pump (ANIMAS VIBE INSULIN PUMP) KIT 1 kit by Subdermal route continuous. Supplies as ordered by account [id number]. ICD10 E10.8, E10.65 1 kit 0  . lisinopril (PRINIVIL,ZESTRIL) 10 MG tablet TAKE ONE TABLET BY MOUTH IN THE MORNING FOR  BLOOD  PRESSURE 90 tablet 3  . loperamide (IMODIUM) 2 MG capsule Take 1 capsule (2 mg total) by mouth 4 (four) times daily as needed for diarrhea or loose stools. 20 capsule 0  . ondansetron (ZOFRAN ODT) 4 MG disintegrating tablet Take 1 tablet (4 mg total) by mouth every 8 (eight) hours as needed for nausea or vomiting. 20 tablet 0  . ranitidine (ZANTAC) 150 MG tablet Take 1 tablet (150 mg total) by mouth 2 (two) times daily. (Patient taking differently: Take 150 mg by mouth 2 (two) times daily as needed for heartburn. ) 60 tablet 2  . venlafaxine (EFFEXOR) 37.5 MG tablet TAKE ONE TABLET BY MOUTH TWICE DAILY 60 tablet 1    Objective: BP 121/74 mmHg  Pulse 100  Temp(Src) 98.2 F (36.8 C) (Oral)  Resp 16  SpO2 98% Exam: General: NAD, resting comfortably Eyes: PERRL, EOMI ENTM: mildly dry MM Neck: asymmetrical enlarged thyroid on palpation  Cardiovascular: RRR, 1-9/4 systolic murmur heard best left upper sternal border.  Respiratory: CTAB, normal effort Abdomen: soft, mild tenderness in upper abdomen, no guarding or rebound MSK: able to move all extremities  Skin: warm and dry Neuro: CN 2-12 grossly intact, strength 5/5 in upper and lower extremities  Psych: Mood and affect euthymic, normal rate and volume of speech  Labs and Imaging: CBC BMET   Recent Labs Lab 03/31/15 0907  WBC 6.9  HGB 13.9  HCT 39.5   PLT 264    Recent Labs Lab 03/31/15 0907  NA 135  K 5.0  CL 96*  CO2 17*  BUN  20  CREATININE 1.26*  GLUCOSE 565*  CALCIUM 10.3    EKG: HR 116. sinus tachy, right atrial enlargement  Troponin: 0 CT abd: no acute findings  Smiley Houseman, MD 03/31/2015, 12:39 PM PGY-1, [place] Intern pager: [phone], text pages welcome  Upper Level Addendum:  I have seen and evaluated this patient along with Dr. Dallas Schimke and reviewed the above note, making necessary revisions in purple.   Archie Patten, MD [place] Resident, PGY-2

## 2015-03-31 NOTE — Progress Notes (Signed)
Pt's labs still not drawn.  Notified Phlebotomy again that it is important to get these labs asap d/t pt being on DKA protocol.

## 2015-04-01 DIAGNOSIS — N179 Acute kidney failure, unspecified: Secondary | ICD-10-CM

## 2015-04-01 DIAGNOSIS — I10 Essential (primary) hypertension: Secondary | ICD-10-CM

## 2015-04-01 DIAGNOSIS — E1010 Type 1 diabetes mellitus with ketoacidosis without coma: Principal | ICD-10-CM

## 2015-04-01 LAB — BASIC METABOLIC PANEL
ANION GAP: 12 (ref 5–15)
ANION GAP: 12 (ref 5–15)
ANION GAP: 10 (ref 5–15)
ANION GAP: 11 (ref 5–15)
Anion gap: 12 (ref 5–15)
BUN: 12 mg/dL (ref 6–20)
BUN: 9 mg/dL (ref 6–20)
BUN: 8 mg/dL (ref 6–20)
BUN: 8 mg/dL (ref 6–20)
BUN: 8 mg/dL (ref 6–20)
CALCIUM: 8.9 mg/dL (ref 8.9–10.3)
CALCIUM: 9.1 mg/dL (ref 8.9–10.3)
CALCIUM: 9.1 mg/dL (ref 8.9–10.3)
CALCIUM: 9.3 mg/dL (ref 8.9–10.3)
CO2: 20 mmol/L — ABNORMAL LOW (ref 22–32)
CO2: 21 mmol/L — AB (ref 22–32)
CO2: 19 mmol/L — AB (ref 22–32)
CO2: 22 mmol/L (ref 22–32)
CO2: 23 mmol/L (ref 22–32)
CREATININE: 0.86 mg/dL (ref 0.44–1.00)
CREATININE: 0.69 mg/dL (ref 0.44–1.00)
CREATININE: 0.72 mg/dL (ref 0.44–1.00)
CREATININE: 0.73 mg/dL (ref 0.44–1.00)
Calcium: 9.1 mg/dL (ref 8.9–10.3)
Chloride: 103 mmol/L (ref 101–111)
Chloride: 106 mmol/L (ref 101–111)
Chloride: 104 mmol/L (ref 101–111)
Chloride: 105 mmol/L (ref 101–111)
Chloride: 104 mmol/L (ref 101–111)
Creatinine, Ser: 0.81 mg/dL (ref 0.44–1.00)
GFR calc Af Amer: >60 mL/min (ref >60)
GFR calc Af Amer: >60 mL/min (ref >60)
GFR calc Af Amer: >60 mL/min (ref >60)
GLUCOSE: 230 mg/dL — AB (ref 65–99)
GLUCOSE: 100 mg/dL — AB (ref 65–99)
GLUCOSE: 222 mg/dL — AB (ref 65–99)
GLUCOSE: 262 mg/dL — AB (ref 65–99)
Glucose, Bld: 211 mg/dL — ABNORMAL HIGH (ref 65–99)
POTASSIUM: 4.1 mmol/L (ref 3.5–5.1)
Potassium: 3.5 mmol/L (ref 3.5–5.1)
Potassium: 3.9 mmol/L (ref 3.5–5.1)
Potassium: 4 mmol/L (ref 3.5–5.1)
Potassium: 3.9 mmol/L (ref 3.5–5.1)
SODIUM: 135 mmol/L (ref 135–145)
Sodium: 139 mmol/L (ref 135–145)
Sodium: 135 mmol/L (ref 135–145)
Sodium: 137 mmol/L (ref 135–145)
Sodium: 138 mmol/L (ref 135–145)

## 2015-04-01 LAB — CBC
HCT: 35.6 % — ABNORMAL LOW (ref 36.0–46.0)
Hemoglobin: 12.4 g/dL (ref 12.0–15.0)
MCH: 28.5 pg (ref 26.0–34.0)
MCHC: 34.8 g/dL (ref 30.0–36.0)
MCV: 81.8 fL (ref 78.0–100.0)
PLATELETS: 245 10*3/uL (ref 150–400)
RBC: 4.35 MIL/uL (ref 3.87–5.11)
RDW: 12.9 % (ref 11.5–15.5)
WBC: 6.7 10*3/uL (ref 4.0–10.5)

## 2015-04-01 LAB — GLUCOSE, CAPILLARY
GLUCOSE-CAPILLARY: 113 mg/dL — AB (ref 65–99)
GLUCOSE-CAPILLARY: 146 mg/dL — AB (ref 65–99)
GLUCOSE-CAPILLARY: 178 mg/dL — AB (ref 65–99)
GLUCOSE-CAPILLARY: 184 mg/dL — AB (ref 65–99)
GLUCOSE-CAPILLARY: 203 mg/dL — AB (ref 65–99)
GLUCOSE-CAPILLARY: 205 mg/dL — AB (ref 65–99)
GLUCOSE-CAPILLARY: 240 mg/dL — AB (ref 65–99)
Glucose-Capillary: 214 mg/dL — ABNORMAL HIGH (ref 65–99)
Glucose-Capillary: 228 mg/dL — ABNORMAL HIGH (ref 65–99)
Glucose-Capillary: 118 mg/dL — ABNORMAL HIGH (ref 65–99)
Glucose-Capillary: 141 mg/dL — ABNORMAL HIGH (ref 65–99)
Glucose-Capillary: 103 mg/dL — ABNORMAL HIGH (ref 65–99)
Glucose-Capillary: 184 mg/dL — ABNORMAL HIGH (ref 65–99)
Glucose-Capillary: 219 mg/dL — ABNORMAL HIGH (ref 65–99)

## 2015-04-01 LAB — HEMOGLOBIN A1C
Hgb A1c MFr Bld: 10.7 % — ABNORMAL HIGH (ref 4.8–5.6)
MEAN PLASMA GLUCOSE: 260 mg/dL

## 2015-04-01 MED ORDER — LISINOPRIL 10 MG PO TABS
10.0000 mg | ORAL_TABLET | Freq: Every day | ORAL | Status: DC
Start: 2015-04-01 — End: 2015-04-02
  Administered 2015-04-01 – 2015-04-02 (×2): 10 mg via ORAL
  Filled 2015-04-01 (×2): qty 1

## 2015-04-01 MED ORDER — INSULIN ASPART 100 UNIT/ML ~~LOC~~ SOLN
1000.0000 [IU] | Freq: Once | SUBCUTANEOUS | Status: AC
  Administered 2015-04-01: 1000 [IU] via SUBCUTANEOUS
  Filled 2015-04-01: qty 10

## 2015-04-01 MED ORDER — GLUCAGON HCL RDNA (DIAGNOSTIC) 1 MG IJ SOLR: 1.0000 mg | Freq: Once | INTRAMUSCULAR | Status: DC | PRN

## 2015-04-01 MED ORDER — INSULIN PUMP
Freq: Three times a day (TID) | SUBCUTANEOUS | Status: DC
  Administered 2015-04-01: 18:00:00 via SUBCUTANEOUS
  Administered 2015-04-01: 2.7 via SUBCUTANEOUS
  Administered 2015-04-02 (×3): via SUBCUTANEOUS
  Filled 2015-04-01: qty 1

## 2015-04-01 NOTE — Progress Notes (Signed)
Inpatient Diabetes Program Recommendations  AACE/ADA: New Consensus Statement on Inpatient Glycemic Control (2015)  Target Ranges:  Prepandial:   less than 140 mg/dL      Peak postprandial:   less than 180 mg/dL (1-2 hours)      Critically ill patients:  140 - 180 mg/dL  Results for [name], [name] (MRN [id number]) as of 04/01/2015 08:33  Ref. Range 04/01/2015 00:48 04/01/2015 01:51 04/01/2015 03:05 04/01/2015 04:02 04/01/2015 04:59 04/01/2015 05:46 04/01/2015 06:50  Glucose-Capillary Latest Ref Range: 65-99 mg/dL 240 (H) 228 (H) 184 (H) 141 (H) 118 (H) 103 (H) 113 (H)  Results for [name], [name] (MRN [id number]) as of 04/01/2015 08:33  Ref. Range 03/31/2015 10:32 03/31/2015 12:14  Glucose-Capillary Latest Ref Range: 65-99 mg/dL 501 (H) 351 (H)   Review of Glycemic Control  Diabetes history: DM1 Outpatient Diabetes medications: Animas insulin pump Current orders for Inpatient glycemic control: GlucoStabilizer per DKA order set  Inpatient Diabetes Program Recommendations: Insulin - IV drip/GlucoStabilizer: According to labs and glucose trends, acidosis is now cleared. Recommend patient be allowed to transition from IV insulin drip back to insulin pump.  Note: Spoke with patient regarding diabetes and home regimen for diabetes management.  Patient states that she has been on an insulin pump for 13 years for diabetes management. Patient uses an Animas insulin pump with Novolog insulin as an outpatient. Patient has her insulin pump at bedside but it is not connected. Patient states that she would prefer to use her insulin pump. Patient has extra insulin pump supplies with her here at the hospital.   Current insulin pump settings are as follows:  Basal insulin  12A 1.9 units/hour 4A 2.3 units/hour 6A 2.4 units/hour 9A 2.3 units/hour 12P 2.1 units/hour 5P 2.8units/hour 10P 1.8 units/hour Total daily basal insulin: 54.40 units/24 hours  Carb Coverage 1:15 1 unit for every 15 grams of  carbohydrates  Insulin Sensitivity 1:30 1 unit drops blood glucose 30 mg/dl  In talking with the patient she states that her blood glucose is "up and down" and her last A1C was11%.  Patient admits that she checks her glucose about 2 times per day. Patient does not currently have an Endocrinologist but she is scheduled to see Dr. Buddy Duty in mid March to establish care with him. Encouraged patient to check her glucose at least 4 times per day (before meals and at bedtime) so that Dr. Buddy Duty will have more information to use to make insulin pump adjustments. Patient verbalized understanding of information discussed and states that she does not have any further questions related to diabetes at this time.  NURSING: Once MD orders the insulin pump order set, please allow patient to restart her insulin pump. Patient will need to insert a new infusion set and she will need to be given Novolog insulin (from hospital supply) to fill a new insulin pump reservoir. The insulin drip should be overlapped with insulin pump by 1 hour and then the insulin drip can be stopped. Once insulin pump order set is ordered please print off the Patient insulin pump contract and flow sheet. The insulin pump contract should be signed by the patient and then placed in the chart. The patient insulin pump flow sheet will be completed by the patient at the bedside and the RN caring for the patient will use the patient's flow sheet to document in the [facility]. RN will need to complete the Nursing Insulin Pump Flowsheet at least once a shift. Patient will need to keep extra insulin  pump supplies at the bedside at all times.   Thanks, Barnie Alderman, RN, MSN, CDE Diabetes Coordinator Inpatient Diabetes Program [phone] (Team Pager from [place] to [place]) [phone] ([place] office) [phone] [facility] office) [phone] [facility] office)

## 2015-04-01 NOTE — Progress Notes (Signed)
Diabetes Educator here to see patient and Dr. Mingo Amber was called to inform him of changes

## 2015-04-01 NOTE — Progress Notes (Signed)
Family Medicine Teaching Service Daily Progress Note Intern Pager: [phone]  Patient name: [name] Medical record number: [id number] Date of birth: May 21, 1970 Age: 45 y.o. Gender: female  Primary Care Provider: Tawanna Sat, MD Consultants: None Code Status: Full  Pt Overview and Major Events to Date:  Admitted 03/31/15 for DKA  Assessment and Plan: [name] is a 45 y.o. female presenting with feeling poorly and elevated blood glucose. PMH is significant for T1DM, HTN, hx breast cancer with left breast mastectomy, HLD  DKA with T1DM: Likely due to a change in insulin requirement as patient has not been seen by endo and has not had any changes in her pump settings. No signs or symptoms of infection: afebrile with a normal WBC. EKG wnl and troponin negative. CBG 565. Bicarb 17. AG 22. UA: >80 ketones, >1000 glucose.  - Gap closed x 2 overnight, bicarb 21 - CBGs qACHS and 2 am once transitioned  - Restart insulin pump, discontinue insulin drip after 1 hour of starting pump - BMP this afternoon, then dailiy - Last A1c 11.0 in 11/09/14, repeat A1c 10.7 - Consulted diabetes coordination to assist with pump  - Completed insulin pump contract with patient - Contact Endocrinologist Dr. Cindra Eves office to try to move up patient's appointment this month  Asymmetric, enlarged thyroid: Last TSH was 0.85 - TSH WNL at 0.988  HTN: Elevated BP with SBP in high 140s. Home meds: Lisinopril  - received Lisinopril x 1 on admission due to elevated BP, held for AKI (now resolved) - Restart home lisinopril 10 mg - Hydralazine PRN   AKI: Improved to 0.69. SCr 1.26 on admission (Baseline 0.6-0.9) Likely due to dehydration.  - IVF with DKA protocol, stop when tolerating PO   Depression: -continue Effexor   FEN/GI: Carb modified diet once transitioned to insulin pump  Prophylaxis: Lovenox Sub Q  Disposition: Transfer to the floor once transitioned to insulin pump   Subjective:   Patient feeling well this morning with good appetite. Has not vomited since hospitalized.   Objective: Temp:  [98.1 F (36.7 C)-98.6 F (37 C)] 98.5 F (36.9 C) (03/02 0328) Pulse Rate:  [73-113] 76 (03/02 0700) Resp:  [14-22] 22 (03/01 1315) BP: (100-157)/(60-134) 138/95 mmHg (03/02 0700) SpO2:  [95 %-100 %] 98 % (03/02 0700) Weight:  [203 lb (92.08 kg)] 203 lb (92.08 kg) (03/01 1345) Physical Exam: General: Well-appearing female, sitting at edge of bed eating breakfast Cardiovascular: RRR, S1, S2, no m/r/g Chest: Lungs CTAB, no increased WOB, left masectomy Abdomen: +BS, S, NTND Extremities: WWP, moves all spontaneously Neuro: AOx3  Laboratory:  Recent Labs Lab 03/31/15 0907 04/01/15 0223  WBC 6.9 6.7  HGB 13.9 12.4  HCT 39.5 35.6*  PLT 264 245    Recent Labs Lab 03/31/15 0907 03/31/15 1830 03/31/15 2205 04/01/15 0222  NA 135 137 139 135  K 5.0 4.3 4.3 4.1  CL 96* 105 104 103  CO2 17* 18* 20* 20*  BUN 20 17 14 12   CREATININE 1.26* 1.00 0.84 0.86  CALCIUM 10.3 9.4 9.0 9.1  PROT 7.8  --   --   --   BILITOT 1.8*  --   --   --   ALKPHOS 210*  --   --   --   ALT 41  --   --   --   AST 21  --   --   --   GLUCOSE 565* 164* 142* 230*    Beckham Capistran Corinda Gubler, MD 04/01/2015,  7:23 AM PGY-1, [place] Intern pager: [phone], text pages welcome

## 2015-04-01 NOTE — Progress Notes (Signed)
FMTS Attending Daily Note:  Terri Sabal MD   S:  Patient doing well.  Sitting up in bed, ready for breakfast.    Exam:  Gen: Alert and awake. NAD.  Well oriented, pleasant. Heart: RRR Lungs: Clear throughout.  Abd: Soft/NT, benign  Imp/Plan: 1. DKA: - resolved. Looks much better both subjectively and objectively - anion gap closed. We will restart her insulin pump at home dosing.   - As eating, can stop her fluids.   - Ok to tx to floor - As she was on her home dosage of insulin and still went into DKA, will need to watch at least overnight to ensure complete resolution of DKA.  - Will attempt to get into endocrinology more quickly.  In between specialists at this point.   2.  AKI; - resolved.   3. Dispo: - if continues improving and CBGs hold, can likely DC home tomorrow  Alveda Reasons, MD 04/01/2015 9:44 AM

## 2015-04-01 NOTE — Progress Notes (Signed)
Patient voices no c/o just waiting for bed assignment

## 2015-04-02 LAB — GLUCOSE, CAPILLARY
GLUCOSE-CAPILLARY: 62 mg/dL — AB (ref 65–99)
GLUCOSE-CAPILLARY: 116 mg/dL — AB (ref 65–99)
GLUCOSE-CAPILLARY: 169 mg/dL — AB (ref 65–99)
Glucose-Capillary: 85 mg/dL (ref 65–99)
Glucose-Capillary: 58 mg/dL — ABNORMAL LOW (ref 65–99)
Glucose-Capillary: 121 mg/dL — ABNORMAL HIGH (ref 65–99)
Glucose-Capillary: 153 mg/dL — ABNORMAL HIGH (ref 65–99)
Glucose-Capillary: 109 mg/dL — ABNORMAL HIGH (ref 65–99)

## 2015-04-02 LAB — BASIC METABOLIC PANEL
ANION GAP: 9 (ref 5–15)
BUN: 7 mg/dL (ref 6–20)
CHLORIDE: 107 mmol/L (ref 101–111)
CO2: 25 mmol/L (ref 22–32)
CREATININE: 0.69 mg/dL (ref 0.44–1.00)
Calcium: 9.1 mg/dL (ref 8.9–10.3)
GFR calc non Af Amer: >60 mL/min (ref >60)
Glucose, Bld: 63 mg/dL — ABNORMAL LOW (ref 65–99)
POTASSIUM: 3.5 mmol/L (ref 3.5–5.1)
SODIUM: 141 mmol/L (ref 135–145)

## 2015-04-02 MED ORDER — ONETOUCH ULTRASOFT LANCETS MISC: Status: DC

## 2015-04-02 MED ORDER — INSULIN DETEMIR 100 UNIT/ML ~~LOC~~ SOLN: 15.0000 [IU] | Freq: Two times a day (BID) | SUBCUTANEOUS | Status: DC

## 2015-04-02 NOTE — Discharge Summary (Signed)
[facility] Discharge Summary  Patient name: [name] Medical record number: [id number] Date of birth: July 14, 1970 Age: 45 y.o. Gender: female Date of Admission: 03/31/2015  Date of Discharge: 04/02/2015 Admitting Physician: Alveda Reasons, MD  Primary Care Provider: Tawanna Sat, MD Consultants: Diabetes Coordinator  Indication for Hospitalization: DKA  Discharge Diagnoses/Problem List:  Active Problems:   DKA, type 1 ([place])   AKI (acute kidney injury) [facility])  Disposition: Home  Discharge Condition: Improved  Discharge Exam:  Blood pressure 129/77, pulse 82, temperature 98.6 F (37 C), temperature source Oral, resp. rate 18, height 5\' 5"  (1.651 m), weight 202 lb (91.627 kg), SpO2 98 %. General: Well-appearing female, resting in bed HEENT: NCAT, MMM Cardiovascular: RRR, S1, S2, no m/r/g Chest: Lungs CTAB, no increased WOB, left masectomy Abdomen: +BS, S, NTND Extremities: WWP, moves all spontaneously Neuro: AOx3, no focal deficits  Brief Hospital Course:  [name] is a 45 y.o. female who presented with feeling poorly, emesis, and elevated blood glucose. PMH is significant for T1DM, HTN, hx breast cancer with left breast mastectomy, HLD, and depression. In the ED, she was found to be in DKA with a CBG of 565, bicarb 17, AG 22, and UA with >80 ketones and >1000 glucose. Etiology unknown but likely was due to pump failure/need to adjust pump settings. She regularly changes her pump site every 2-3 days but has not followed with an endocrinologist since 08/2013. She was afebrile, had no leukocytosis, and had a negative troponin with normal EKG. Her hbg A1c was 10.7, down from her last measure of 11.0 on 11/09/14. She was admitted, and DKA resolved with insulin drip and IVFs. She was transitioned to her home insulin pump the morning after admission, 04/01/15. She had hypoglycemia to 58 the morning of 04/02/15, so her morning basal insulin rates were  decreased by 10% by the patient with assistance of the diabetes coordinator. She had no emesis during hospitalization and had regained her appetite by time of discharge. She has follow-up with [facility] within 2 weeks.    On admission, her basal pump settings were: Midnight = 1.9, 4 AM = 2.3-2.4,. 9 AM = 2.3,. 12 noon = 2.1 , 4 PM = 2.8 and 10 PM = 1.8. Upon discharge, midnight = 1.7 units/hr, 4 AM = 2.1 units/hr, 6 AM = 2.2 units/hr.   Hospitalization was also complicated by an AKI with SCr 1.26 on admission, which improved to 0.69 by time of discharge (baseline). She had some blood pressures above 140/90 when lisinopril was held for AKI. Her thyroid was also felt to be enlarged on exam, but TSH was WNL at 0.988.    Issues for Follow Up:  1. Review blood sugar log with patient. Make sure she has all needed supplies. Lancets and back-up insulin were ordered at discharge.  2. Blood pressure back on home lisinopril.    Significant Procedures: None  Significant Labs and Imaging:   Recent Labs Lab 03/31/15 0907 04/01/15 0223  WBC 6.9 6.7  HGB 13.9 12.4  HCT 39.5 35.6*  PLT 264 245    Recent Labs Lab 03/31/15 0907  04/01/15 0615 04/01/15 1037 04/01/15 1428 04/01/15 2257 04/02/15 0500  NA 135  < > 139 135 137 138 141  K 5.0  < > 3.5 3.9 4.0 3.9 3.5  CL 96*  < > 106 104 105 104 107  CO2 17*  < > 21* 19* 22 23 25   GLUCOSE 565*  < >  100* 222* 262* 211* 63*  [name] 20  < > 9 8 8 8 7   CREATININE 1.26*  < > 0.69 0.72 0.73 0.81 0.69  CALCIUM 10.3  < > 8.9 9.1 9.1 9.3 9.1  ALKPHOS 210*  --   --   --   --   --   --   AST 21  --   --   --   --   --   --   ALT 41  --   --   --   --   --   --   ALBUMIN 4.2  --   --   --   --   --   --   < > = values in this interval not displayed.   Results/Tests Pending at Time of Discharge: None  Discharge Medications:    Medication List    TAKE these medications        Chromium Picolinate 500 MCG Caps  Take 500 mcg by mouth once a  week.     ergocalciferol 50000 units capsule  Commonly known as:  VITAMIN D2  Take 1 capsule (50,000 Units total) by mouth once a week.     ibuprofen 200 MG tablet  Commonly known as:  ADVIL,MOTRIN  Take 400 mg by mouth every 6 (six) hours as needed for moderate pain.     insulin aspart 100 UNIT/ML injection  Commonly known as:  NOVOLOG  USE MAX 100 UNITS PER DAY WITH PUMP     insulin detemir 100 UNIT/ML injection  Commonly known as:  LEVEMIR  Inject 0.15 mLs (15 Units total) into the skin 2 (two) times daily. BACKUP FOR PUMP ONLY     lisinopril 10 MG tablet  Commonly known as:  PRINIVIL,ZESTRIL  TAKE ONE TABLET BY MOUTH IN THE MORNING FOR  BLOOD  PRESSURE     onetouch ultrasoft lancets  Use as instructed     venlafaxine 37.5 MG tablet  Commonly known as:  EFFEXOR  TAKE ONE TABLET BY MOUTH TWICE DAILY        Discharge Instructions: Please refer to Patient Instructions section of EMR for full details.  Patient was counseled important signs and symptoms that should prompt return to medical care, changes in medications, dietary instructions, activity restrictions, and follow up appointments.   Follow-Up Appointments: Follow-up Information    Follow up with Marina Goodell, MD. Go on 04/06/2015.   Specialty:  Family Medicine   Why:  10:00 am for hospital follow-up   Contact information:   [place] [address] [phone]       Rogue Bussing, MD 04/04/2015, 6:06 PM PGY-1, [place]

## 2015-04-02 NOTE — Progress Notes (Signed)
Family Medicine Teaching Service Daily Progress Note Intern Pager: [phone]  Patient name: [name] Medical record number: [id number] Date of birth: July 05, 1970 Age: 45 y.o. Gender: female  Primary Care Provider: Tawanna Sat, MD Consultants: None Code Status: Full  Pt Overview and Major Events to Date:  Admitted 03/31/15 for DKA  Assessment and Plan: [name] is a 45 y.o. female presenting with feeling poorly and elevated blood glucose. PMH is significant for T1DM, HTN, hx breast cancer with left breast mastectomy, HLD  DKA with T1DM: Likely due to a change in insulin requirement vs pump failure as patient has not been seen by endo and has not had any changes in her pump settings. No signs or symptoms of infection: afebrile with a normal WBC. EKG wnl and troponin negative. CBG 565. Bicarb 17. AG 22. UA: >80 ketones, >1000 glucose. DKA resolved by correction with 2-bag method. She was transitioned to her home insulin pump yesterday, 04/01/15. Had hypoglycemia to 58 this a.m. after eating a full dinner and nighttime snack.  - CBGs qACHS and 2 am  - Restarted insulin pump - BMP daily - Last A1c 11.0 in 11/09/14, repeat A1c 10.7 - Consulted diabetes coordination to assist with pump --> provided recommendations for decreasing a.m. insulin by 10% to avoid morning hypoglycemia  - Completed insulin pump contract with patient - Contacted Endocrinologist Dr. Cindra Eves office to try to move up patient's appointment this month; can only move her to top of cancellation list  Asymmetric, enlarged thyroid: Last TSH was 0.85 - TSH WNL at 0.988  HTN: Elevated BP, 147/97 overnight. Home meds: Lisinopril  - received Lisinopril x 1 on admission due to elevated BP, held for AKI (now resolved) - Continue home lisinopril 10 mg - Hydralazine PRN   AKI: Resolved. SCr improved to 0.69. SCr 1.26 on admission (Baseline 0.6-0.9) Likely due to dehydration.   Depression: -continue Effexor   FEN/GI:  Carb modified diet  Prophylaxis: Lovenox Sub Q  Disposition: Likely discharge today, pending changing pump settings   Subjective:  Patient was aware of feeling of hypoglycemic this a.m. Has no complaints and interested in going home.   Objective: Temp:  [97.8 F (36.6 C)-98.6 F (37 C)] 98.5 F (36.9 C) (03/03 0541) Pulse Rate:  [77-86] 79 (03/03 0541) Resp:  [18-21] 21 (03/03 0541) BP: (128-147)/(74-97) 128/74 mmHg (03/03 0541) SpO2:  [97 %-99 %] 99 % (03/03 0541) Weight:  [202 lb (91.627 kg)] 202 lb (91.627 kg) (03/02 2056) Physical Exam: General: Well-appearing female, resting in bed HEENT: NCAT, MMM Cardiovascular: RRR, S1, S2, no m/r/g Chest: Lungs CTAB, no increased WOB, left masectomy Abdomen: +BS, S, NTND Extremities: WWP, moves all spontaneously Neuro: AOx3, no focal deficits  Laboratory:  Recent Labs Lab 03/31/15 0907 04/01/15 0223  WBC 6.9 6.7  HGB 13.9 12.4  HCT 39.5 35.6*  PLT 264 245    Recent Labs Lab 03/31/15 0907  04/01/15 1428 04/01/15 2257 04/02/15 0500  NA 135  < > 137 138 141  K 5.0  < > 4.0 3.9 3.5  CL 96*  < > 105 104 107  CO2 17*  < > 22 23 25   BUN 20  < > 8 8 7   CREATININE 1.26*  < > 0.73 0.81 0.69  CALCIUM 10.3  < > 9.1 9.3 9.1  PROT 7.8  --   --   --   --   BILITOT 1.8*  --   --   --   --  ALKPHOS 210*  --   --   --   --   ALT 41  --   --   --   --   AST 21  --   --   --   --   GLUCOSE 565*  < > 262* 211* 63*  < > = values in this interval not displayed.  Rogue Bussing, MD 04/02/2015, 7:29 AM PGY-1, [place] Intern pager: [phone], text pages welcome

## 2015-04-02 NOTE — Discharge Instructions (Signed)
[name],   You were admitted for diabetic ketoacidosis. Your sugars corrected and were able to be controlled once your pump was restarted. Your a.m. insulin was reduced to help avoid morning hypoglycemia. Please keep a log of your blood sugars to help your endocrinologist and primary care doctor follow how your sugars have been running.  Future Appointments Date Time Provider [place]  04/06/2015 10:00 AM Veatrice Bourbon, MD [facility]  04/22/2015 9:15 AM Anne Shutter, RD [place] [facility]  11/30/2015 10:00 AM Nicholas Lose, MD [facility] None

## 2015-04-02 NOTE — Progress Notes (Addendum)
Inpatient Diabetes Program Recommendations  AACE/ADA: New Consensus Statement on Inpatient Glycemic Control (2015)  Target Ranges:  Prepandial:   less than 140 mg/dL      Peak postprandial:   less than 180 mg/dL (1-2 hours)      Critically ill patients:  140 - 180 mg/dL   Review of Glycemic Control  Current orders for Inpatient glycemic control: Insulin pump  Inpatient Diabetes Program Recommendations: Insulin pump: Noted fasting glucose 58 mg/dl this morning. Patient will need insulin pump setting adjustments. However she is not able to see Dr. Buddy Duty until mid March. Inpatient Diabetes team is not insulin pump certified to make any changes with insulin pump settings. However, in most cases if patient is experiencing hypoglycemia MD would reduce basal rates by at least 10% during time of hypoglycemia. Therefore, MD may want to decrease the following basal rates as follows and leave the other basal rates as currently set in pump:  Basal insulin  12A1.9 units/hour <decrease to  1.7 units/hour 4A2.3 units/hour <decrease to  2.1 units/hour 6A2.4 units/hour <decrease to  2.2 units/hour  [place] [phone] to see if an appointment could be made for patient to see Harvie Bridge, RD, CDE (as she is insulin pump certified and assist patient's with making insulin pump changes under the care of an Endocrinologist). Talked with [facility] secretary and Harvie Bridge, RD, CDE does not have an opening until March 23 @ 9:15 so she has been scheduled this appointment date and time for now. Patient already has an initial appointment with Dr. Buddy Duty scheduled for mid March but she really needs insulin pump adjustments now. According to MD note for today, Dr. Cindra Eves office has been contacted to see if patient could be seen earlier but unfortunately they are not able to move her appointment up but MD note indicates that patient will be at the top of Dr. Cindra Eves  cancellation list. Therefore, attending MD may want to decrease morning basal rates as noted above.   04/02/15@13 :38-Talked with Harvie Bridge, RD, CDE at [facility] and she is not able to see patient until March 23 as already scheduled. Talked with Dr. Ola Spurr about reducing morning basal rates as noted above. Dr. Ola Spurr agrees with recommendations and placed order to assist patient with changing basal rates.   Watched as patient made the following changes to her basal rates: 12A1.7 units/hour 4A 2.1 units/hour 6A 2.2 units/hour Verified basal rate changes with patient and another diabetes coordinator Adah Perl, RN, BC-ADM).   Patient is already scheduled to see Dr. Buddy Duty on March 17 and informed patient that she has been scheduled an appointment with Harvie Bridge, RD, CDE at [facility] for March 23 and patient stated that she has already seen that on her MyChart. Patient appreciative of assistance and has no further questions at this time related to diabetes.  Thanks, Barnie Alderman, RN, MSN, CDE Diabetes Coordinator Inpatient Diabetes Program [phone] (Team Pager from [place] to [place]) [phone] ([facility] office) [phone] [facility] office) [phone] [facility] office)

## 2015-04-02 NOTE — Progress Notes (Signed)
[name] to be D/C'd Home per MD order.  Discussed prescriptions and follow up appointments with the patient. Prescriptions given to patient, medication list explained in detail. Pt verbalized understanding.    Medication List    TAKE these medications        Chromium Picolinate 500 MCG Caps  Take 500 mcg by mouth once a week.     ergocalciferol 50000 units capsule  Commonly known as:  VITAMIN D2  Take 1 capsule (50,000 Units total) by mouth once a week.     ibuprofen 200 MG tablet  Commonly known as:  ADVIL,MOTRIN  Take 400 mg by mouth every 6 (six) hours as needed for moderate pain.     insulin aspart 100 UNIT/ML injection  Commonly known as:  NOVOLOG  USE MAX 100 UNITS PER DAY WITH PUMP     insulin detemir 100 UNIT/ML injection  Commonly known as:  LEVEMIR  Inject 0.15 mLs (15 Units total) into the skin 2 (two) times daily. BACKUP FOR PUMP ONLY     lisinopril 10 MG tablet  Commonly known as:  PRINIVIL,ZESTRIL  TAKE ONE TABLET BY MOUTH IN THE MORNING FOR  BLOOD  PRESSURE     onetouch ultrasoft lancets  Use as instructed     venlafaxine 37.5 MG tablet  Commonly known as:  EFFEXOR  TAKE ONE TABLET BY MOUTH TWICE DAILY        Filed Vitals:   04/02/15 1203 04/02/15 1602  BP: 148/84 129/77  Pulse: 83 82  Temp: 98.7 F (37.1 C) 98.6 F (37 C)  Resp: 17 18    Skin clean, dry and intact without evidence of skin break down, no evidence of skin tears noted. IV catheter discontinued intact. Site without signs and symptoms of complications. Dressing and pressure applied. Pt denies pain at this time. No complaints noted.  An After Visit Summary was printed and given to the patient. Patient escorted via [place], and D/C home via private auto.  Carole Civil RN [facility] Phone [phone]

## 2015-04-06 ENCOUNTER — Ambulatory Visit (INDEPENDENT_AMBULATORY_CARE_PROVIDER_SITE_OTHER): Payer: Commercial Managed Care - PPO | Admitting: Student

## 2015-04-06 ENCOUNTER — Encounter: Payer: Self-pay | Admitting: Student

## 2015-04-06 VITALS — BP 120/78 | HR 61 | Temp 98.7°F | Ht 65.0 in | Wt 204.0 lb

## 2015-04-06 DIAGNOSIS — E1065 Type 1 diabetes mellitus with hyperglycemia: Secondary | ICD-10-CM | POA: Diagnosis not present

## 2015-04-06 DIAGNOSIS — E108 Type 1 diabetes mellitus with unspecified complications: Secondary | ICD-10-CM | POA: Diagnosis not present

## 2015-04-06 DIAGNOSIS — IMO0002 Reserved for concepts with insufficient information to code with codable children: Secondary | ICD-10-CM

## 2015-04-06 LAB — GLUCOSE, CAPILLARY: GLUCOSE-CAPILLARY: 173 mg/dL — AB (ref 65–99)

## 2015-04-06 MED ORDER — GLUCOSE BLOOD VI STRP: ORAL_STRIP | Status: DC

## 2015-04-06 NOTE — Patient Instructions (Signed)
Follow-up with PCP in 2 months Please schedule an ophthalmology appointment as soon as you can Please record blood sugars to take to endocrinology appointment on 3/17 You have questions or concerns please call the office at [phone]

## 2015-04-06 NOTE — Progress Notes (Signed)
   Subjective:    Patient ID: [name], female    DOB: 15-Apr-1970, 45 y.o.   MRN: [id number]   CC: Hospital follow-up for DKA  HPI: 45 year old female with history of type 1 diabetes presenting for hospital follow-up  DKA - Patient reports she was never given prescription for test strips and has not been able to test her blood sugars at home - POC glucose in the office 173 -Patient reports she's got an upcoming endocrinology appointment on 3/17 and the like to continue diabetes management with them - She reports she has felt well since being discharged from hospital - Otherwise patient denies chest pain, shortness of breath  Review of Systems ROS Per history of present illness otherwise she denies recent fevers, illnesses, nausea, vomiting, diarrhea, headache, changes in vision Past Medical, Surgical, Social, and Family History Reviewed & Updated per EMR.   Objective:  BP 120/78 mmHg  Pulse 61  Temp(Src) 98.7 F (37.1 C) (Oral)  Ht 5\' 5"  (1.651 m)  Wt 204 lb (92.534 kg)  BMI 33.95 kg/m2  SpO2 96% Vitals and nursing note reviewed  General: NAD Cardiac: RRR, normal heart sounds,  Respiratory: CTAB, normal effort Abdomen: soft, nontender, nondistended,  Bowel sounds present, insulin pump in place at right lower abdomen Skin: warm and dry, no rashes noted Neuro: alert and oriented, no focal deficits   Assessment & Plan:    Type I diabetes mellitus with complication, uncontrolled ([place]) DKA resolved. Plans to continue to follow with endocrinology for diabetes care Prescription for test strips given today and patient was encouraged to keep a blood sugar log. - Follow-up endocrinology appointment.     Alyssa A. Lincoln Brigham MD, [place] [facility] Resident PGY-2 Pager [phone]

## 2015-04-06 NOTE — Assessment & Plan Note (Addendum)
DKA resolved. Plans to continue to follow with endocrinology for diabetes care Prescription for test strips given today and patient was encouraged to keep a blood sugar log. - Follow-up endocrinology appointment.

## 2015-04-08 ENCOUNTER — Telehealth: Payer: Self-pay | Admitting: Student

## 2015-04-08 ENCOUNTER — Telehealth: Payer: Self-pay | Admitting: *Deleted

## 2015-04-08 DIAGNOSIS — E108 Type 1 diabetes mellitus with unspecified complications: Principal | ICD-10-CM

## 2015-04-08 DIAGNOSIS — E1065 Type 1 diabetes mellitus with hyperglycemia: Secondary | ICD-10-CM

## 2015-04-08 DIAGNOSIS — IMO0002 Reserved for concepts with insufficient information to code with codable children: Secondary | ICD-10-CM

## 2015-04-08 MED ORDER — GLUCOSE BLOOD VI STRP: ORAL_STRIP | Status: DC

## 2015-04-08 NOTE — Telephone Encounter (Signed)
Test strips reordered 

## 2015-04-08 NOTE — Telephone Encounter (Signed)
Patient walked into clinic today stating she can not get her test strips from the pharmacy.  [facility] was called the pharmacist stated that the diagnosis code and frequency of testing needed to be on the prescription.  Insurance will not accept USE AS DIRECTED for the directions.  It has to stated number times testing.  Please send in new Rx for test strips.  Derl Barrow, RN

## 2015-04-09 ENCOUNTER — Other Ambulatory Visit: Payer: Self-pay | Admitting: *Deleted

## 2015-04-09 DIAGNOSIS — E108 Type 1 diabetes mellitus with unspecified complications: Principal | ICD-10-CM

## 2015-04-09 DIAGNOSIS — E1065 Type 1 diabetes mellitus with hyperglycemia: Secondary | ICD-10-CM

## 2015-04-09 DIAGNOSIS — IMO0002 Reserved for concepts with insufficient information to code with codable children: Secondary | ICD-10-CM

## 2015-04-09 MED ORDER — GLUCOSE BLOOD VI STRP: ORAL_STRIP | Status: DC

## 2015-04-12 ENCOUNTER — Other Ambulatory Visit: Payer: Self-pay | Admitting: Family Medicine

## 2015-04-14 ENCOUNTER — Other Ambulatory Visit: Payer: Self-pay | Admitting: Hematology and Oncology

## 2015-04-14 DIAGNOSIS — C50412 Malignant neoplasm of upper-outer quadrant of left female breast: Secondary | ICD-10-CM

## 2015-04-16 DIAGNOSIS — Z794 Long term (current) use of insulin: Secondary | ICD-10-CM | POA: Diagnosis not present

## 2015-04-16 DIAGNOSIS — E1065 Type 1 diabetes mellitus with hyperglycemia: Secondary | ICD-10-CM | POA: Diagnosis not present

## 2015-04-19 DIAGNOSIS — E109 Type 1 diabetes mellitus without complications: Secondary | ICD-10-CM | POA: Diagnosis not present

## 2015-04-19 DIAGNOSIS — Z794 Long term (current) use of insulin: Secondary | ICD-10-CM | POA: Diagnosis not present

## 2015-04-22 ENCOUNTER — Ambulatory Visit: Payer: Commercial Managed Care - PPO | Admitting: *Deleted

## 2015-04-29 DIAGNOSIS — Z794 Long term (current) use of insulin: Secondary | ICD-10-CM | POA: Diagnosis not present

## 2015-04-29 DIAGNOSIS — E109 Type 1 diabetes mellitus without complications: Secondary | ICD-10-CM | POA: Diagnosis not present

## 2015-05-21 ENCOUNTER — Encounter: Payer: Self-pay | Admitting: *Deleted

## 2015-05-21 ENCOUNTER — Encounter: Payer: Commercial Managed Care - PPO | Attending: Family Medicine | Admitting: *Deleted

## 2015-05-21 VITALS — Ht 65.0 in | Wt 201.4 lb

## 2015-05-21 DIAGNOSIS — E108 Type 1 diabetes mellitus with unspecified complications: Secondary | ICD-10-CM

## 2015-05-21 DIAGNOSIS — E1010 Type 1 diabetes mellitus with ketoacidosis without coma: Secondary | ICD-10-CM | POA: Diagnosis not present

## 2015-05-21 DIAGNOSIS — E1065 Type 1 diabetes mellitus with hyperglycemia: Secondary | ICD-10-CM

## 2015-05-21 DIAGNOSIS — IMO0002 Reserved for concepts with insufficient information to code with codable children: Secondary | ICD-10-CM

## 2015-05-28 ENCOUNTER — Other Ambulatory Visit: Payer: Self-pay | Admitting: Internal Medicine

## 2015-05-28 DIAGNOSIS — E1065 Type 1 diabetes mellitus with hyperglycemia: Secondary | ICD-10-CM | POA: Diagnosis not present

## 2015-05-28 DIAGNOSIS — E041 Nontoxic single thyroid nodule: Secondary | ICD-10-CM

## 2015-05-28 DIAGNOSIS — Z794 Long term (current) use of insulin: Secondary | ICD-10-CM | POA: Diagnosis not present

## 2015-05-28 DIAGNOSIS — R946 Abnormal results of thyroid function studies: Secondary | ICD-10-CM | POA: Diagnosis not present

## 2015-06-04 NOTE — Patient Instructions (Signed)
We talked today about the many stresses in your life, both physical and emotional We separated some of them out so you can evaluate which ones you can do something about and which ones you can't You have ordered the book "Diabetes Burnout" from the ADA as suggested by Dr. Kandee Keen have agreed to try and test your BG as often as you can so you can give your body insulin to correct BG's more often which will improve how you feel and your A1c We will discuss other options to help your control once you have had a chance to meet with a therapist, to help your deal with your emotions You feel your Carb Counting is an 8 out of 10, it just gets old trying to do it all the time. We can discuss simpler ways to count carbs by food group instead of grams.

## 2015-06-04 NOTE — Progress Notes (Signed)
Diabetes Self-Management Education  Visit Type: First/Initial  Appt. Start Time: 0900 Appt. End Time: 1030  06/04/2015  [name], identified by name and date of birth, is a 45 y.o. female with a diagnosis of Diabetes: Type 1.   ASSESSMENT  Height 5\' 5"  (1.651 m), weight 201 lb 6.4 oz (91.354 kg). Body mass index is 33.51 kg/(m^2).    Individualized Plan for Diabetes Self-Management Training:   Learning Objective:  Patient will have a greater understanding of diabetes self-management. Patient education plan is to attend individual and/or group sessions per assessed needs and concerns.   Plan:   Patient Instructions  We talked today about the many stresses in your life, both physical and emotional We separated some of them out so you can evaluate which ones you can do something about and which ones you can't You have ordered the book "Diabetes Burnout" from the ADA as suggested by Dr. Kandee Keen have agreed to try and test your BG as often as you can so you can give your body insulin to correct BG's more often which will improve how you feel and your A1c We will discuss other options to help your control once you have had a chance to meet with a therapist, to help your deal with your emotions You feel your Carb Counting is an 8 out of 10, it just gets old trying to do it all the time. We can discuss simpler ways to count carbs by food group instead of grams.   Expected Outcomes:  Demonstrated interest in learning. Expect positive outcomes  Education material provided: Support group flyer and Carbohydrate counting sheet, therapist recommendation to [place], APRN, MSN, CS  If problems or questions, patient to contact team via:  Phone and Email  Future DSME appointment: 4-6 wks

## 2015-06-07 ENCOUNTER — Ambulatory Visit
Admission: RE | Admit: 2015-06-07 | Discharge: 2015-06-07 | Disposition: A | Discharge status: Home / Self Care | Payer: Commercial Managed Care - PPO | Source: Ambulatory Visit | Attending: Internal Medicine | Admitting: Internal Medicine

## 2015-06-07 DIAGNOSIS — E041 Nontoxic single thyroid nodule: Secondary | ICD-10-CM

## 2015-06-17 DIAGNOSIS — M2241 Chondromalacia patellae, right knee: Secondary | ICD-10-CM | POA: Diagnosis not present

## 2015-06-18 DIAGNOSIS — M25561 Pain in right knee: Secondary | ICD-10-CM | POA: Diagnosis not present

## 2015-06-18 DIAGNOSIS — R531 Weakness: Secondary | ICD-10-CM | POA: Diagnosis not present

## 2015-06-18 DIAGNOSIS — M222X1 Patellofemoral disorders, right knee: Secondary | ICD-10-CM | POA: Diagnosis not present

## 2015-06-20 ENCOUNTER — Encounter (HOSPITAL_COMMUNITY): Payer: Self-pay

## 2015-06-20 ENCOUNTER — Inpatient Hospital Stay (HOSPITAL_COMMUNITY)
Admission: EM | Admit: 2015-06-20 | Discharge: 2015-06-22 | DRG: 638 | Disposition: A | Discharge status: Home / Self Care | Payer: Commercial Managed Care - PPO | Attending: Family Medicine | Admitting: Family Medicine

## 2015-06-20 DIAGNOSIS — I10 Essential (primary) hypertension: Secondary | ICD-10-CM | POA: Diagnosis present

## 2015-06-20 DIAGNOSIS — F329 Major depressive disorder, single episode, unspecified: Secondary | ICD-10-CM | POA: Diagnosis not present

## 2015-06-20 DIAGNOSIS — H269 Unspecified cataract: Secondary | ICD-10-CM | POA: Diagnosis present

## 2015-06-20 DIAGNOSIS — Z8249 Family history of ischemic heart disease and other diseases of the circulatory system: Secondary | ICD-10-CM

## 2015-06-20 DIAGNOSIS — Z90710 Acquired absence of both cervix and uterus: Secondary | ICD-10-CM

## 2015-06-20 DIAGNOSIS — E1110 Type 2 diabetes mellitus with ketoacidosis without coma: Secondary | ICD-10-CM | POA: Diagnosis present

## 2015-06-20 DIAGNOSIS — E1010 Type 1 diabetes mellitus with ketoacidosis without coma: Secondary | ICD-10-CM | POA: Diagnosis not present

## 2015-06-20 DIAGNOSIS — E785 Hyperlipidemia, unspecified: Secondary | ICD-10-CM | POA: Diagnosis present

## 2015-06-20 DIAGNOSIS — N179 Acute kidney failure, unspecified: Secondary | ICD-10-CM | POA: Diagnosis present

## 2015-06-20 DIAGNOSIS — Z9221 Personal history of antineoplastic chemotherapy: Secondary | ICD-10-CM

## 2015-06-20 DIAGNOSIS — Z794 Long term (current) use of insulin: Secondary | ICD-10-CM | POA: Diagnosis not present

## 2015-06-20 DIAGNOSIS — I890 Lymphedema, not elsewhere classified: Secondary | ICD-10-CM | POA: Diagnosis present

## 2015-06-20 DIAGNOSIS — R739 Hyperglycemia, unspecified: Secondary | ICD-10-CM | POA: Diagnosis not present

## 2015-06-20 DIAGNOSIS — Z79899 Other long term (current) drug therapy: Secondary | ICD-10-CM

## 2015-06-20 DIAGNOSIS — Z833 Family history of diabetes mellitus: Secondary | ICD-10-CM

## 2015-06-20 DIAGNOSIS — IMO0002 Reserved for concepts with insufficient information to code with codable children: Secondary | ICD-10-CM | POA: Diagnosis present

## 2015-06-20 DIAGNOSIS — Z853 Personal history of malignant neoplasm of breast: Secondary | ICD-10-CM

## 2015-06-20 DIAGNOSIS — Z6834 Body mass index (BMI) 34.0-34.9, adult: Secondary | ICD-10-CM

## 2015-06-20 DIAGNOSIS — E1065 Type 1 diabetes mellitus with hyperglycemia: Secondary | ICD-10-CM

## 2015-06-20 DIAGNOSIS — E108 Type 1 diabetes mellitus with unspecified complications: Secondary | ICD-10-CM

## 2015-06-20 DIAGNOSIS — E10319 Type 1 diabetes mellitus with unspecified diabetic retinopathy without macular edema: Secondary | ICD-10-CM | POA: Diagnosis present

## 2015-06-20 DIAGNOSIS — E860 Dehydration: Secondary | ICD-10-CM | POA: Diagnosis present

## 2015-06-20 DIAGNOSIS — E875 Hyperkalemia: Secondary | ICD-10-CM | POA: Diagnosis present

## 2015-06-20 DIAGNOSIS — Z9641 Presence of insulin pump (external) (internal): Secondary | ICD-10-CM | POA: Diagnosis present

## 2015-06-20 DIAGNOSIS — Z923 Personal history of irradiation: Secondary | ICD-10-CM

## 2015-06-20 DIAGNOSIS — Z9012 Acquired absence of left breast and nipple: Secondary | ICD-10-CM

## 2015-06-20 LAB — I-STAT VENOUS BLOOD GAS, ED
ACID-BASE DEFICIT: 9 mmol/L — AB (ref 0.0–2.0)
Bicarbonate: 16 mEq/L — ABNORMAL LOW (ref 20.0–24.0)
O2 SAT: 78 %
PO2 VEN: 46 mmHg — AB (ref 31.0–45.0)
TCO2: 17 mmol/L (ref 0–100)
pCO2, Ven: 31.9 mmHg — ABNORMAL LOW (ref 45.0–50.0)
pH, Ven: 7.309 — ABNORMAL HIGH (ref 7.250–7.300)

## 2015-06-20 LAB — URINALYSIS, ROUTINE W REFLEX MICROSCOPIC
BILIRUBIN URINE: NEGATIVE
Hgb urine dipstick: NEGATIVE
Leukocytes, UA: NEGATIVE
Nitrite: NEGATIVE
PH: 5.5 (ref 5.0–8.0)
Protein, ur: NEGATIVE mg/dL
Specific Gravity, Urine: 1.033 — ABNORMAL HIGH (ref 1.005–1.030)

## 2015-06-20 LAB — BASIC METABOLIC PANEL
ANION GAP: 19 — AB (ref 5–15)
Anion gap: 13 (ref 5–15)
Anion gap: 11 (ref 5–15)
BUN: 17 mg/dL (ref 6–20)
BUN: 14 mg/dL (ref 6–20)
BUN: 11 mg/dL (ref 6–20)
CHLORIDE: 105 mmol/L (ref 101–111)
CO2: 15 mmol/L — AB (ref 22–32)
CO2: 16 mmol/L — ABNORMAL LOW (ref 22–32)
CO2: 18 mmol/L — ABNORMAL LOW (ref 22–32)
CREATININE: 0.74 mg/dL (ref 0.44–1.00)
Calcium: 9.7 mg/dL (ref 8.9–10.3)
Calcium: 8.4 mg/dL — ABNORMAL LOW (ref 8.9–10.3)
Calcium: 8.2 mg/dL — ABNORMAL LOW (ref 8.9–10.3)
Chloride: 99 mmol/L — ABNORMAL LOW (ref 101–111)
Chloride: 107 mmol/L (ref 101–111)
Creatinine, Ser: 1.12 mg/dL — ABNORMAL HIGH (ref 0.44–1.00)
Creatinine, Ser: 1.07 mg/dL — ABNORMAL HIGH (ref 0.44–1.00)
GFR calc Af Amer: >60 mL/min (ref >60)
GFR calc Af Amer: >60 mL/min (ref >60)
GFR calc Af Amer: >60 mL/min (ref >60)
GFR calc non Af Amer: 58 mL/min — ABNORMAL LOW (ref >60)
GFR calc non Af Amer: >60 mL/min (ref >60)
GLUCOSE: 392 mg/dL — AB (ref 65–99)
Glucose, Bld: 294 mg/dL — ABNORMAL HIGH (ref 65–99)
Glucose, Bld: 206 mg/dL — ABNORMAL HIGH (ref 65–99)
POTASSIUM: 5.3 mmol/L — AB (ref 3.5–5.1)
POTASSIUM: 4.6 mmol/L (ref 3.5–5.1)
POTASSIUM: 3.7 mmol/L (ref 3.5–5.1)
SODIUM: 134 mmol/L — AB (ref 135–145)
SODIUM: 136 mmol/L (ref 135–145)
Sodium: 133 mmol/L — ABNORMAL LOW (ref 135–145)

## 2015-06-20 LAB — GLUCOSE, CAPILLARY
GLUCOSE-CAPILLARY: 242 mg/dL — AB (ref 65–99)
GLUCOSE-CAPILLARY: 139 mg/dL — AB (ref 65–99)
GLUCOSE-CAPILLARY: 129 mg/dL — AB (ref 65–99)
GLUCOSE-CAPILLARY: 146 mg/dL — AB (ref 65–99)
Glucose-Capillary: 222 mg/dL — ABNORMAL HIGH (ref 65–99)
Glucose-Capillary: 193 mg/dL — ABNORMAL HIGH (ref 65–99)
Glucose-Capillary: 176 mg/dL — ABNORMAL HIGH (ref 65–99)
Glucose-Capillary: 119 mg/dL — ABNORMAL HIGH (ref 65–99)

## 2015-06-20 LAB — CBC WITH DIFFERENTIAL/PLATELET
Basophils Absolute: 0 10*3/uL (ref 0.0–0.1)
Basophils Relative: 0 %
Eosinophils Absolute: 0 10*3/uL (ref 0.0–0.7)
Eosinophils Relative: 0 %
HEMATOCRIT: 41.5 % (ref 36.0–46.0)
Hemoglobin: 14.6 g/dL (ref 12.0–15.0)
LYMPHS PCT: 22 %
Lymphs Abs: 2.1 10*3/uL (ref 0.7–4.0)
MCH: 28.7 pg (ref 26.0–34.0)
MCHC: 35.2 g/dL (ref 30.0–36.0)
MCV: 81.7 fL (ref 78.0–100.0)
MONO ABS: 0.3 10*3/uL (ref 0.1–1.0)
MONOS PCT: 3 %
NEUTROS ABS: 7.2 10*3/uL (ref 1.7–7.7)
Neutrophils Relative %: 75 %
Platelets: 295 10*3/uL (ref 150–400)
RBC: 5.08 MIL/uL (ref 3.87–5.11)
RDW: 12.6 % (ref 11.5–15.5)
WBC: 9.6 10*3/uL (ref 4.0–10.5)

## 2015-06-20 LAB — URINE MICROSCOPIC-ADD ON

## 2015-06-20 LAB — CBG MONITORING, ED
GLUCOSE-CAPILLARY: 284 mg/dL — AB (ref 65–99)
Glucose-Capillary: 331 mg/dL — ABNORMAL HIGH (ref 65–99)

## 2015-06-20 LAB — MRSA PCR SCREENING: MRSA by PCR: NEGATIVE

## 2015-06-20 MED ORDER — SODIUM CHLORIDE 0.9 % IV SOLN
INTRAVENOUS | Status: AC
  Administered 2015-06-20: 23:00:00 via INTRAVENOUS

## 2015-06-20 MED ORDER — SODIUM CHLORIDE 0.9% FLUSH
3.0000 mL | Freq: Two times a day (BID) | INTRAVENOUS | Status: DC
  Administered 2015-06-20 – 2015-06-22 (×4): 3 mL via INTRAVENOUS

## 2015-06-20 MED ORDER — DIPHENHYDRAMINE HCL 50 MG/ML IJ SOLN
25.0000 mg | Freq: Once | INTRAMUSCULAR | Status: AC
  Administered 2015-06-20: 25 mg via INTRAVENOUS
  Filled 2015-06-20: qty 1

## 2015-06-20 MED ORDER — SODIUM CHLORIDE 0.9 % IV BOLUS (SEPSIS)
1000.0000 mL | Freq: Once | INTRAVENOUS | Status: AC
  Administered 2015-06-20: 1000 mL via INTRAVENOUS

## 2015-06-20 MED ORDER — SODIUM CHLORIDE 0.9 % IV SOLN
INTRAVENOUS | Status: DC
  Administered 2015-06-20: 2.2 [IU]/h via INTRAVENOUS
  Filled 2015-06-20: qty 2.5

## 2015-06-20 MED ORDER — ATORVASTATIN CALCIUM 10 MG PO TABS
10.0000 mg | ORAL_TABLET | Freq: Every day | ORAL | Status: DC
  Administered 2015-06-21 – 2015-06-22 (×2): 10 mg via ORAL
  Filled 2015-06-20 (×2): qty 1

## 2015-06-20 MED ORDER — VENLAFAXINE HCL 37.5 MG PO TABS
37.5000 mg | ORAL_TABLET | Freq: Two times a day (BID) | ORAL | Status: DC
  Administered 2015-06-20 – 2015-06-22 (×4): 37.5 mg via ORAL
  Filled 2015-06-20 (×5): qty 1

## 2015-06-20 MED ORDER — HEPARIN SODIUM (PORCINE) 5000 UNIT/ML IJ SOLN
5000.0000 [IU] | Freq: Three times a day (TID) | INTRAMUSCULAR | Status: DC
  Administered 2015-06-20 – 2015-06-22 (×6): 5000 [IU] via SUBCUTANEOUS
  Filled 2015-06-20 (×6): qty 1

## 2015-06-20 MED ORDER — DEXTROSE 50 % IV SOLN: 25.0000 mL | INTRAVENOUS | Status: DC | PRN

## 2015-06-20 MED ORDER — PNEUMOCOCCAL VAC POLYVALENT 25 MCG/0.5ML IJ INJ
0.5000 mL | INJECTION | INTRAMUSCULAR | Status: AC
  Administered 2015-06-21: 0.5 mL via INTRAMUSCULAR
  Filled 2015-06-20: qty 0.5

## 2015-06-20 MED ORDER — DEXTROSE-NACL 5-0.45 % IV SOLN
INTRAVENOUS | Status: DC
  Administered 2015-06-20: 15:00:00 via INTRAVENOUS

## 2015-06-20 MED ORDER — INSULIN REGULAR BOLUS VIA INFUSION
0.0000 [IU] | Freq: Three times a day (TID) | INTRAVENOUS | Status: DC
  Filled 2015-06-20: qty 10

## 2015-06-20 MED ORDER — SODIUM CHLORIDE 0.9 % IV SOLN
INTRAVENOUS | Status: DC
  Filled 2015-06-20: qty 2.5

## 2015-06-20 MED ORDER — METOCLOPRAMIDE HCL 5 MG/ML IJ SOLN
10.0000 mg | Freq: Once | INTRAMUSCULAR | Status: AC
  Administered 2015-06-20: 10 mg via INTRAVENOUS
  Filled 2015-06-20: qty 2

## 2015-06-20 MED ORDER — DEXTROSE-NACL 5-0.45 % IV SOLN
INTRAVENOUS | Status: DC
  Administered 2015-06-20 (×2): via INTRAVENOUS

## 2015-06-20 NOTE — ED Provider Notes (Signed)
CSN: [id number]     Arrival date & time 06/20/15  1119 History   First MD Initiated Contact with Patient 06/20/15 1207     Chief Complaint  Patient presents with  . Hyperglycemia     (Consider location/radiation/quality/duration/timing/severity/associated sxs/prior Treatment) HPI  45 year old female presents with hyperglycemia. Noticed yesterday her glucose was in the 500s. Today has been in the 300s. She cannot give an exact number what her glucose usually is but states that it is usually high. She currently has an insulin pump and then gives herself sliding scale insulin. Today patient has been feeling a frontal headache and some nausea. She typically gets these symptoms whenever she is in DKA which she has been in multiple times. No vomiting. No fevers or neck pain. No diarrhea. She overall feels weak and fatigued but no focal weakness.  Past Medical History  Diagnosis Date  . Breast mass in female     fibrocystic changes  . Hyperlipidemia   . Nausea   . Hypertension   . Nausea & vomiting     for approx. 2 weeks  . Cataracts, bilateral   . Diabetes mellitus     insulin dependant - uses pump, Dr. Dwyane Dee  . Breast cancer ([place])     T2N2 tiple negative left breast ca   . S/P chemotherapy, time since 4-12 weeks   . History of chemotherapy     taxotere/cytoxan with neulasta support  . Lymphedema of arm     left upper extremity  . Blood clot in vein     nonocclusive clot right subclavian vein, on Xarelto for 6 months  . Allergy   . Breast cancer ([place])   . Radiation 08/21/11-10/05/11    Left chestwll/Supraclav./left PAB/left scar   Past Surgical History  Procedure Laterality Date  . Bladder tacking  01/2009  . Shoulder surgery  04/26/10    left  . Tubal ligation  12/1999  . Left mastectomy  02/21/2011  . Portacath placement  02/21/2011    Procedure: INSERTION PORT-A-CATH;  Surgeon: Adin Hector, MD;  Location: [place];  Service: General;  Laterality: N/A;  . Mastectomy modified  radical  02/21/11    left,invasive  multifocal grade III ductal ca,high grade dcis,lymph/vascular invasion invasion (7/14) nodes positive mets  . Port-a-cath removal  10/13/2011    Procedure: MINOR REMOVAL PORT-A-CATH;  Surgeon: Adin Hector, MD;  Location: [place];  Service: General;  Laterality: Right;  PORT-A CATH REMOVAL  . Mass excision  02/19/2012    Procedure: MINOR EXCISION OF MASS;  Surgeon: Adin Hector, MD;  Location: [place];  Service: General;  Laterality: Left;  . Abdominal hysterectomy  2002    still has ovaries    Family History  Problem Relation Age of Onset  . Hypertension    . Diabetes    . Deep vein thrombosis    . Kidney disease    . Cancer Maternal Aunt     breast  . Cancer Maternal Uncle 50     right eye removed s/p cancer  . Cancer Maternal Grandfather     prostate ca   Social History  Substance Use Topics  . Smoking status: Never Smoker   . Smokeless tobacco: Never Used  . Alcohol Use: No   OB History    Obstetric Comments   G4P3, menarche age 2, and menopause at time of hysterectomy; no hx of hormone replacement therapy     Review of Systems  Constitutional: Positive for fatigue. Negative for fever.  Gastrointestinal: Positive for nausea. Negative for vomiting and abdominal pain.  Neurological: Positive for weakness and headaches. Negative for numbness.  All other systems reviewed and are negative.     Allergies  Hydrocodone-acetaminophen; Insulin glargine; and Nph iletin i  Home Medications   Prior to Admission medications   Medication Sig Start Date End Date Taking? Authorizing Provider  atorvastatin (LIPITOR) 10 MG tablet Take 10 mg by mouth daily. 06/01/15  Yes Historical Provider, MD  ergocalciferol (VITAMIN D2) 50000 units capsule Take 1 capsule (50,000 Units total) by mouth once a week. Patient taking differently: Take 50,000 Units by mouth every Sunday.  03/08/15  Yes Nicholas Lose, MD   ibuprofen (ADVIL,MOTRIN) 200 MG tablet Take 400 mg by mouth every 6 (six) hours as needed for moderate pain.    Yes Historical Provider, MD  insulin aspart (NOVOLOG) 100 UNIT/ML injection USE MAX 100 UNITS PER DAY WITH PUMP 11/09/14  Yes Leone Brand, MD  lisinopril (PRINIVIL,ZESTRIL) 10 MG tablet TAKE ONE TABLET BY MOUTH IN THE MORNING FOR  BLOOD  PRESSURE 04/17/14  Yes Leone Brand, MD  venlafaxine (EFFEXOR) 37.5 MG tablet TAKE ONE TABLET BY MOUTH TWICE DAILY 04/14/15  Yes Nicholas Lose, MD  glucose blood test strip Please use OneTouch Ultra test strips. Test Blood Sugar Three Times A Day.  Dx code: E10.8. 04/09/15   Dickie La, MD  insulin detemir (LEVEMIR) 100 UNIT/ML injection Inject 0.15 mLs (15 Units total) into the skin 2 (two) times daily. BACKUP FOR PUMP ONLY Patient not taking: Reported on 06/04/2015 04/02/15   Rogue Bussing, MD  Lancets [facility] ULTRASOFT) lancets Use as instructed 04/02/15   Rogue Bussing, MD   BP 118/58 mmHg  Pulse 83  Temp(Src) 98.1 F (36.7 C) (Oral)  Resp 16  Ht 5\' 5"  (1.651 m)  Wt 207 lb (93.895 kg)  BMI 34.45 kg/m2  SpO2 100% Physical Exam  Constitutional: She is oriented to person, place, and time. She appears well-developed and well-nourished.  HENT:  Head: Normocephalic and atraumatic.  Right Ear: External ear normal.  Left Ear: External ear normal.  Nose: Nose normal.  Eyes: EOM are normal. Pupils are equal, round, and reactive to light. Right eye exhibits no discharge. Left eye exhibits no discharge.  Neck: Neck supple.  Cardiovascular: Normal rate, regular rhythm and normal heart sounds.   Pulmonary/Chest: Effort normal and breath sounds normal.  Abdominal: Soft. She exhibits no distension. There is no tenderness.  Neurological: She is alert and oriented to person, place, and time.  CN II-12 grossly intact. 5/5 strength in all 4 extremities. Normal gross sensation.  Skin: Skin is warm and dry.  Nursing note and vitals  reviewed.   ED Course  Procedures (including critical care time) Labs Review Labs Reviewed  BASIC METABOLIC PANEL - Abnormal; Notable for the following:    Sodium 133 (*)    Potassium 5.3 (*)    Chloride 99 (*)    CO2 15 (*)    Glucose, Bld 392 (*)    Creatinine, Ser 1.12 (*)    GFR calc non Af Amer 58 (*)    Anion gap 19 (*)    All other components within normal limits  URINALYSIS, ROUTINE W REFLEX MICROSCOPIC (NOT AT [facility]) - Abnormal; Notable for the following:    Specific Gravity, Urine 1.033 (*)    Glucose, UA >1000 (*)    Ketones, ur >80 (*)  All other components within normal limits  URINE MICROSCOPIC-ADD ON - Abnormal; Notable for the following:    Squamous Epithelial / LPF 0-5 (*)    Bacteria, UA RARE (*)    All other components within normal limits  CBG MONITORING, ED - Abnormal; Notable for the following:    Glucose-Capillary 331 (*)    All other components within normal limits  I-STAT VENOUS BLOOD GAS, ED - Abnormal; Notable for the following:    pH, Ven 7.309 (*)    pCO2, Ven 31.9 (*)    pO2, Ven 46.0 (*)    Bicarbonate 16.0 (*)    Acid-base deficit 9.0 (*)    All other components within normal limits  CBC WITH DIFFERENTIAL/PLATELET    Imaging Review No results found. I have personally reviewed and evaluated these images and lab results as part of my medical decision-making.   EKG Interpretation   Date/Time:  Sunday Jun 20 2015 11:49:35 EDT Ventricular Rate:  87 PR Interval:  122 QRS Duration: 83 QT Interval:  356 QTC Calculation: 428 R Axis:   33 Text Interpretation:  Sinus rhythm Right atrial enlargement Consider right  ventricular hypertrophy Baseline wander in lead(s) V6 no significant  change since Mar 2017 Confirmed by Regenia Skeeter MD, Monica Zahler [phone]) on 06/20/2015  11:52:41 AM      MDM   Final diagnoses:  Diabetic ketoacidosis without coma associated with type 1 diabetes mellitus ([place])  AKI (acute kidney injury) ([place])    Patient's  presentation is c/w DKA. She is overall well appearing. HA is much better after treatment. HA seems like it is related to DKA as she gets this every time she's in DKA. Given IV fluids and then insulin started. Due to insulin drip and pH will admit patient to step down in [facility] service.     Sherwood Gambler, MD 06/20/15 [phone]

## 2015-06-20 NOTE — ED Notes (Signed)
Patient here with 2 days of blood sugar running very high. Has insulin pump and yesterday BS greater than 500 and today high 300's. Denies any illness. Reports fatigue and aching with headache and nausea

## 2015-06-20 NOTE — H&P (Signed)
[facility] Admission History and Physical Service Pager: [phone]  Patient name: [name] Medical record number: [id number] Date of birth: 05/29/1970 Age: 45 y.o. Gender: female  Primary Care Provider: Tawanna Sat, MD Consultants: none Code Status: FULL (discussed on admission)  Chief Complaint: nausea, vomiting, malaise  Assessment and Plan: [name] is a 45 y.o. female presenting with DKA . PMH is significant for DM1 uncontrolled w/ retinopathy, HTN, HLD, depression  # DKA/ hyperglycemia in a Type 1 diabetic:  VSS.  Mentating normally.  BG 392 on admission. (of note, patient reports she had bolused herself with insulin prior to arrival).  A1c 03/2015: 10.7. K 5.3.  AG 19.  VBG pH 7.309, pCO2 31.9, pO2 46.0, Bicarb 16.  UA: >1000 glucose, >80 ketones, spec grav 1.033.  S/p 2 L NS bolus in ED. Patient uses an insulin pump (novolog/ levemir).  No recent illness or obvious inciting event for hyperglycemia at this time.  - Admit to inpatient, SDU, under Dr Ree Kida - VS per floor protocol - Cardiac monitoring until K normalized - BG q1, BMP q3.  Space once transitioned to sub-q insulin - insulin gtt, transition to sub-q once AG closed x2 - IVFs, transition to D5 1/2NS once BG<250 - Hold insulin pump at this time.  Discussed care with my attending.  Recommends to resume insulin pump once AG closed x2 in lieu of bridging with Levemir.  - Call to Dr Buddy Duty in am (endocrinology) for instructions re: insulin pump at discharge - Zofran prn nausea  # AKI w/ hyperkalemia: Cr 1.12 (baseline 0.69-0.8), K 5.3, likely from dehydration associated with hyperglycemic state - IVFs as above - BMPs as above  # HTN: BP normal.  On lisinopril 10mg  qam - Hold for now in setting of AKI - restart in am if able  # HLD: On Lipitor 10mg  - Continue lipitor  # Depression: on Effexor 37.5mg  BID - continue effexor  FEN/GI: NPO for now, then HH/carb mod diet, IVFs as  above Prophylaxis: heparin sub-q  Disposition: SDU for glucose stabilization  History of Present Illness:  [name] is a 45 y.o. female presenting with DKA  Patient notes that she has been feeling poorly over the last day or so.  She reports nausea/ vomiting/ headache.  She reports polyuria and polydipsia.  She denies recent illness preceding symptoms.  No cough, congestion, fevers, chills, diarrhea, N/V prior to today.  She reports compliance with insulin pump.  No recent increase in carbohydrate consumption.  She sees Dr Buddy Duty for endocrinology.  Last appt within last month or so.  She uses Novolog at a basal rate of 1.7 and has a 1:11 carb ratio.  She uses Levemir as a backup and notes allergy to Lantus.  Review Of Systems: Per HPI with the following additions: none Otherwise the remainder of the systems were negative.  Patient Active Problem List   Diagnosis Date Noted  . DKA (diabetic ketoacidoses) ([place]) 06/20/2015  . AKI (acute kidney injury) ([place])   . Vitamin D deficiency disease 11/23/2014  . Change in hearing of right ear 11/09/2014  . Acute upper respiratory infections of unspecified site 04/25/2013  . Essential hypertension 12/04/2012  . Cough 09/01/2012  . Other and unspecified ovarian cyst 08/12/2012  . Pap smear for cervical cancer screening 08/12/2012  . Diabetic retinopathy ([place]) 07/12/2012  . Jaw pain 06/26/2012  . Mass of left side of neck 02/06/2012  . Breast carcinoma metastatic, skin 05/08/2011  .  Cancer of left breast ([place]) 01/19/2011  . Patellofemoral syndrome, Right 03/10/2010  . THORACIC SOMATIC DYSFUNCTION 02/15/2010  . SOMATIC DYSFUNCTION, RIB CAGE 02/15/2010  . ROTATOR CUFF INJURY, LEFT SHOULDER 11/09/2009  . HOT FLASHES 06/14/2007  . Type I diabetes mellitus with complication, uncontrolled ([place]) 11/16/2006  . HYPERLIPIDEMIA 11/16/2006  . DEPRESSION, MAJOR 11/16/2006    Past Medical History: Past Medical History  Diagnosis Date  . Breast mass  in female     fibrocystic changes  . Hyperlipidemia   . Nausea   . Hypertension   . Nausea & vomiting     for approx. 2 weeks  . Cataracts, bilateral   . Diabetes mellitus     insulin dependant - uses pump, Dr. Dwyane Dee  . Breast cancer ([place])     T2N2 tiple negative left breast ca   . S/P chemotherapy, time since 4-12 weeks   . History of chemotherapy     taxotere/cytoxan with neulasta support  . Lymphedema of arm     left upper extremity  . Blood clot in vein     nonocclusive clot right subclavian vein, on Xarelto for 6 months  . Allergy   . Breast cancer ([place])   . Radiation 08/21/11-10/05/11    Left chestwll/Supraclav./left PAB/left scar    Past Surgical History: Past Surgical History  Procedure Laterality Date  . Bladder tacking  01/2009  . Shoulder surgery  04/26/10    left  . Tubal ligation  12/1999  . Left mastectomy  02/21/2011  . Portacath placement  02/21/2011    Procedure: INSERTION PORT-A-CATH;  Surgeon: Adin Hector, MD;  Location: [facility];  Service: General;  Laterality: N/A;  . Mastectomy modified radical  02/21/11    left,invasive  multifocal grade III ductal ca,high grade dcis,lymph/vascular invasion invasion (7/14) nodes positive mets  . Port-a-cath removal  10/13/2011    Procedure: MINOR REMOVAL PORT-A-CATH;  Surgeon: Adin Hector, MD;  Location: [facility];  Service: General;  Laterality: Right;  PORT-A CATH REMOVAL  . Mass excision  02/19/2012    Procedure: MINOR EXCISION OF MASS;  Surgeon: Adin Hector, MD;  Location: [facility];  Service: General;  Laterality: Left;  . Abdominal hysterectomy  2002    still has ovaries     Social History: Social History  Substance Use Topics  . Smoking status: Never Smoker   . Smokeless tobacco: Never Used  . Alcohol Use: No   Additional social history: no ETOH, no tob, no drugs  Please also refer to relevant sections of EMR.  Family History: Family History  Problem Relation Age  of Onset  . Hypertension    . Diabetes    . Deep vein thrombosis    . Kidney disease    . Cancer Maternal Aunt     breast  . Cancer Maternal Uncle 50     right eye removed s/p cancer  . Cancer Maternal Grandfather     prostate ca    Allergies and Medications: Allergies  Allergen Reactions  . Hydrocodone-Acetaminophen Itching and Swelling  . Insulin Glargine Itching, Nausea And Vomiting and Swelling  . Nph Iletin I [Insulin Isophane, Mixed Nph] Itching, Nausea And Vomiting and Swelling   No current facility-administered medications on file prior to encounter.   Current Outpatient Prescriptions on File Prior to Encounter  Medication Sig Dispense Refill  . ergocalciferol (VITAMIN D2) 50000 units capsule Take 1 capsule (50,000 Units total) by mouth once a week. (Patient  taking differently: Take 50,000 Units by mouth every Sunday. ) 12 capsule 3  . ibuprofen (ADVIL,MOTRIN) 200 MG tablet Take 400 mg by mouth every 6 (six) hours as needed for moderate pain.     Marland Kitchen insulin aspart (NOVOLOG) 100 UNIT/ML injection USE MAX 100 UNITS PER DAY WITH PUMP 40 mL 3  . lisinopril (PRINIVIL,ZESTRIL) 10 MG tablet TAKE ONE TABLET BY MOUTH IN THE MORNING FOR  BLOOD  PRESSURE 90 tablet 3  . venlafaxine (EFFEXOR) 37.5 MG tablet TAKE ONE TABLET BY MOUTH TWICE DAILY 60 tablet 1  . glucose blood test strip Please use OneTouch Ultra test strips. Test Blood Sugar Three Times A Day.  Dx code: E10.8. 100 each 12  . insulin detemir (LEVEMIR) 100 UNIT/ML injection Inject 0.15 mLs (15 Units total) into the skin 2 (two) times daily. BACKUP FOR PUMP ONLY (Patient not taking: Reported on 06/04/2015) 10 mL 0  . Lancets (ONETOUCH ULTRASOFT) lancets Use as instructed 100 each 12    Objective: BP 118/58 mmHg  Pulse 83  Temp(Src) 98.1 F (36.7 C) (Oral)  Resp 16  Ht 5\' 5"  (1.651 m)  Wt 207 lb (93.895 kg)  BMI 34.45 kg/m2  SpO2 100% Exam: General: awake, alert, NAD, sitting up at bedside Eyes: EOMI, PERRL, sclera  white ENTM: o/p clear, MM mildly dry Neck: supple Cardiovascular: RRR, no murmurs Respiratory: CTAB, normal WOB on room air Abdomen: obese, soft, NT/ND, +BS MSK: WWP, no edema Skin: no rashes Neuro: no focal deficits, follows commands Psych: speech normal, mood stable  Labs and Imaging: CBC BMET   Recent Labs Lab 06/20/15 1148  WBC 9.6  HGB 14.6  HCT 41.5  PLT 295    Recent Labs Lab 06/20/15 1148  NA 133*  K 5.3*  CL 99*  CO2 15*  BUN 17  CREATININE 1.12*  GLUCOSE 392*  CALCIUM 9.7     CBG (last 3)   Recent Labs  06/20/15 1131 06/20/15 1412  GLUCAP 331* 284*    No results found.   Janora Norlander, DO 06/20/2015, 2:09 PM PGY-2, [place] Intern pager: [phone], text pages welcome

## 2015-06-20 NOTE — ED Notes (Signed)
Pt ambulatory to restroom

## 2015-06-20 NOTE — Progress Notes (Signed)
Patient restarted her insulin pump at her home rate of 1.7 units per hour.

## 2015-06-20 NOTE — Progress Notes (Signed)
**  Interval Note**  Anion gap closed x2.  Discussed with Gabe, RN, that we will restart her insulin pump at home rate of 1.7.  Continue insulin gtt for next 2 hours then can discontinue.  Continue q1 BG x2 more and q3 BMP x1 more then will space out to q4 BG and q6 BMP.  May start diet after insulin gtt discontinued in 2 hours.  Carb modified/HH diet.    BMP Latest Ref Rng 06/20/2015 06/20/2015 06/20/2015  Glucose 65 - 99 mg/dL 206(H) 294(H) 392(H)  BUN 6 - 20 mg/dL 11 14 17   Creatinine 0.44 - 1.00 mg/dL 0.74 1.07(H) 1.12(H)  Sodium 135 - 145 mmol/L 136 134(L) 133(L)  Potassium 3.5 - 5.1 mmol/L 3.7 4.6 5.3(H)  Chloride 101 - 111 mmol/L 107 105 99(L)  CO2 22 - 32 mmol/L 18(L) 16(L) 15(L)  Calcium 8.9 - 10.3 mg/dL 8.2(L) 8.4(L) 9.7    CBG (last 3)   Recent Labs  06/20/15 1532 06/20/15 1627 06/20/15 1738  GLUCAP 242* 222* 193*    Terri Soderquist M. Lajuana Ripple, DO PGY-2, [place]

## 2015-06-21 LAB — BASIC METABOLIC PANEL
ANION GAP: 11 (ref 5–15)
Anion gap: 12 (ref 5–15)
Anion gap: 11 (ref 5–15)
BUN: 9 mg/dL (ref 6–20)
BUN: 9 mg/dL (ref 6–20)
BUN: 11 mg/dL (ref 6–20)
CHLORIDE: 107 mmol/L (ref 101–111)
CHLORIDE: 105 mmol/L (ref 101–111)
CHLORIDE: 103 mmol/L (ref 101–111)
CO2: 17 mmol/L — ABNORMAL LOW (ref 22–32)
CO2: 18 mmol/L — AB (ref 22–32)
CO2: 19 mmol/L — ABNORMAL LOW (ref 22–32)
CREATININE: 0.83 mg/dL (ref 0.44–1.00)
CREATININE: 0.99 mg/dL (ref 0.44–1.00)
Calcium: 8.6 mg/dL — ABNORMAL LOW (ref 8.9–10.3)
Calcium: 8.3 mg/dL — ABNORMAL LOW (ref 8.9–10.3)
Calcium: 9.1 mg/dL (ref 8.9–10.3)
Creatinine, Ser: 0.75 mg/dL (ref 0.44–1.00)
GFR calc Af Amer: >60 mL/min (ref >60)
GFR calc Af Amer: >60 mL/min (ref >60)
GFR calc non Af Amer: >60 mL/min (ref >60)
GLUCOSE: 306 mg/dL — AB (ref 65–99)
Glucose, Bld: 140 mg/dL — ABNORMAL HIGH (ref 65–99)
Glucose, Bld: 225 mg/dL — ABNORMAL HIGH (ref 65–99)
POTASSIUM: 3.7 mmol/L (ref 3.5–5.1)
POTASSIUM: 4.1 mmol/L (ref 3.5–5.1)
POTASSIUM: 4.1 mmol/L (ref 3.5–5.1)
SODIUM: 135 mmol/L (ref 135–145)
SODIUM: 133 mmol/L — AB (ref 135–145)
Sodium: 135 mmol/L (ref 135–145)

## 2015-06-21 LAB — CBC
HCT: 35.4 % — ABNORMAL LOW (ref 36.0–46.0)
HEMOGLOBIN: 12.4 g/dL (ref 12.0–15.0)
MCH: 28.2 pg (ref 26.0–34.0)
MCHC: 35 g/dL (ref 30.0–36.0)
MCV: 80.5 fL (ref 78.0–100.0)
Platelets: 218 10*3/uL (ref 150–400)
RBC: 4.4 MIL/uL (ref 3.87–5.11)
RDW: 12.4 % (ref 11.5–15.5)
WBC: 6.7 10*3/uL (ref 4.0–10.5)

## 2015-06-21 LAB — GLUCOSE, CAPILLARY
GLUCOSE-CAPILLARY: 285 mg/dL — AB (ref 65–99)
GLUCOSE-CAPILLARY: 339 mg/dL — AB (ref 65–99)
GLUCOSE-CAPILLARY: 288 mg/dL — AB (ref 65–99)
Glucose-Capillary: 142 mg/dL — ABNORMAL HIGH (ref 65–99)
Glucose-Capillary: 234 mg/dL — ABNORMAL HIGH (ref 65–99)
Glucose-Capillary: 353 mg/dL — ABNORMAL HIGH (ref 65–99)
Glucose-Capillary: 324 mg/dL — ABNORMAL HIGH (ref 65–99)
Glucose-Capillary: 282 mg/dL — ABNORMAL HIGH (ref 65–99)
Glucose-Capillary: 246 mg/dL — ABNORMAL HIGH (ref 65–99)

## 2015-06-21 MED ORDER — LISINOPRIL 10 MG PO TABS
10.0000 mg | ORAL_TABLET | Freq: Every day | ORAL | Status: DC
  Administered 2015-06-21 – 2015-06-22 (×2): 10 mg via ORAL
  Filled 2015-06-21 (×2): qty 1

## 2015-06-21 NOTE — Progress Notes (Signed)
Pt stated reservoir was empty.  Pt given 2 cc novolog insulin to refill reservoir.  Pt refilled reservoir in front of RN then reattached system.

## 2015-06-21 NOTE — Progress Notes (Signed)
Family Medicine Teaching Service Daily Progress Note Intern Pager: [phone]  Patient name: [name] Medical record number: [id number] Date of birth: 1970/06/26 Age: 45 y.o. Gender: female  Primary Care Provider: Tawanna Sat, MD Consultants:  None Code Status:  Full  Pt Overview and Major Events to Date:   Assessment and Plan:  MISCHELLE STOCKSTILL is a 45 y.o. female presenting with DKA . PMH is significant for DM1 uncontrolled w/ retinopathy, HTN, HLD, depression  # Resolved DKA/ hyperglycemia in a Type 1 diabetic: VSS. Mentating normally. BG 392 on admission. (of note, patient reports she had bolused herself with insulin prior to arrival). A1c 03/2015: 10.7. K 5.3. AG 19. VBG pH 7.309, pCO2 31.9, pO2 46.0, Bicarb 16. UA: >1000 glucose, >80 ketones, spec grav 1.033. S/p 2 L NS bolus in ED. Patient uses an insulin pump (novolog/ levemir). No recent illness or obvious inciting event for hyperglycemia at this time.  - CBG 142, 234, 285 - AG closed x 4, K 4.1 - VS per floor protocol - restarted insulin pump, told patient to bolus this am  - Call to Dr Buddy Duty in am (endocrinology) for instructions re: insulin pump at discharge - Zofran prn nausea  # Resolved AKI w/ hyperkalemia: Cr 1.12>>>0.83  (baseline 0.69-0.8), likely from dehydration - BMPs as above  # HTN: BP normal. On lisinopril 10mg  qam - continue lisinopril 10 mg   # HLD: On Lipitor 10mg  - Continue lipitor  # Depression: on Effexor 37.5mg  BID - continue effexor  FEN/GI: carb mod diet Prophylaxis: heparin sub-q  Disposition: Transitioned to Med-surg, home today  Subjective:  Patient is doing well. She states she has been managing her insulin without any issues and is not sure why she was in DKA. However, later in the conservations, she said she is not the best with her diabetes. She has been a bit burned out.   Objective: Temp:  [97.9 F (36.6 C)-98.5 F (36.9 C)] 98.4 F (36.9 C) (05/22 0309) Pulse  Rate:  [73-100] 84 (05/21 2000) Resp:  [14-19] 18 (05/22 0309) BP: (103-139)/(50-95) 116/66 mmHg (05/22 0309) SpO2:  [97 %-100 %] 98 % (05/22 0309) Weight:  [195 lb 8.8 oz (88.7 kg)-207 lb (93.895 kg)] 195 lb 8.8 oz (88.7 kg) (05/21 1458) Physical Exam: General: Lying in bed, no issues, NAD  Cardiovascular: RRR, no murmurs  Respiratory: CTAB, no wheezes, no rhonchi  Abdomen: BS+, no ttp, no distention  Extremities: no lower extremity edema.   Laboratory:  Recent Labs Lab 06/20/15 1148 06/21/15 0305  WBC 9.6 6.7  HGB 14.6 12.4  HCT 41.5 35.4*  PLT 295 218    Recent Labs Lab 06/20/15 1455 06/20/15 1822 06/20/15 2320  NA 134* 136 135  K 4.6 3.7 3.7  CL 105 107 107  CO2 16* 18* 17*  BUN 14 11 9   CREATININE 1.07* 0.74 0.75  CALCIUM 8.4* 8.2* 8.6*  GLUCOSE 294* 206* 140*    Imaging/Diagnostic Tests: No results found.  Asiyah Cletis Media, MD 06/21/2015, 7:12 AM PGY-1, [place] Intern pager: [phone], text pages Terri

## 2015-06-21 NOTE — Progress Notes (Signed)
Inpatient Diabetes Program Recommendations  AACE/ADA: New Consensus Statement on Inpatient Glycemic Control (2015)  Target Ranges:  Prepandial:   less than 140 mg/dL      Peak postprandial:   less than 180 mg/dL (1-2 hours)      Critically ill patients:  140 - 180 mg/dL   Review of Glycemic Control  Diabetes history: DM1 Outpatient Diabetes medications: Insulin pump (see settings below) Current orders for Inpatient glycemic control: Insulin Pump  [place]: 1.7 units/hour Bolus: CHO ratio 1:11, CF 30   Results for ROSINA, CRESSLER (MRN [id number]) as of 06/21/2015 16:22  Ref. Range 06/21/2015 03:08 06/21/2015 07:27 06/21/2015 11:34 06/21/2015 12:42 06/21/2015 15:03  Glucose-Capillary Latest Ref Range: 65-99 mg/dL 234 (H) 285 (H) 353 (H) 339 (H) 324 (H)  Results for AMBERLYN, MARTINEZGARCIA (MRN [id number]) as of 06/21/2015 16:22  Ref. Range 06/21/2015 03:05  Sodium Latest Ref Range: 135-145 mmol/L 135  Potassium Latest Ref Range: 3.5-5.1 mmol/L 4.1  Chloride Latest Ref Range: 101-111 mmol/L 105  CO2 Latest Ref Range: 22-32 mmol/L 18 (L)  BUN Latest Ref Range: 6-20 mg/dL 9  Creatinine Latest Ref Range: 0.44-1.00 mg/dL 0.83  Calcium Latest Ref Range: 8.9-10.3 mg/dL 8.3 (L)  EGFR (Non-African Amer.) Latest Ref Range: >60 mL/min >60  EGFR (African American) Latest Ref Range: >60 mL/min >60  Glucose Latest Ref Range: 65-99 mg/dL 225 (H)  Anion gap Latest Ref Range: 5-15  12  Results for MANASA, SPEASE (MRN [id number]) as of 06/21/2015 16:22  Ref. Range 03/31/2015 18:30  Hemoglobin A1C Latest Ref Range: 4.8-5.6 % 10.7 (H)   BMET pending at present.  Inpatient Diabetes Program Recommendations:    D/C insulin pump per Insulin Pump Order Set. Begin Levemir 15 units bid. Add Novolog sensitive tidwc + hs Add Novolog 6 units tidwc for meal coverage insulin.  This Diabetes Coordinator asked pt to call [facility] pump co to do a safety check since blood sugars are not coming down. When I went back in room to  look at pump, it was disconnected and pt states she had forgotten to connect after the safety check. This morning, pt ran out of insulin in the reservoir and said "it wasn't empty for that long." When pt asked why she thinks she has been in DKA, she said she just didn't know. RN states pump was not connected when she went in room around 12:30 pm. ? Whether pump is working or whether pump is being manipulated by pt. Pt does admit to a lot of stress right now and states "I'm depressed."  Do not think pt should have insulin pump on at this time.  Will follow. Thank you. Lorenda Peck, RD, LDN, CDE Inpatient Diabetes Coordinator [phone]

## 2015-06-21 NOTE — Progress Notes (Signed)
RN asked patient to show insulin pump tubing site.  When pt pulled gown away from site, RN noted that tubing not connected to patient.  Patient stated she had trouble connecting tubing to site.  Tubing now intact.  Will continue to monitor pt closely.

## 2015-06-21 NOTE — Discharge Instructions (Signed)
You were admitted with elevated blood sugars and you were put on an insulin drip. You improved with insulin and your insulin pump was started this morning. If your blood sugar get up the 400s at home please call your PCP or your endocrinologist for advice. If become n/v, increase urination, abdominal pain, consider coming back to the ED to be evaluated.

## 2015-06-21 NOTE — Discharge Summary (Signed)
[facility] Discharge Summary  Patient name: [name] Medical record number: [id number] Date of birth: 06/18/1970 Age: 45 y.o. Gender: female Date of Admission: 06/20/2015  Date of Discharge: 06/21/2015 Admitting Physician: Lupita Dawn, MD  Primary Care Provider: Tawanna Sat, MD Consultants: None   Indication for Hospitalization:  DKA, hyperglycemia   Discharge Diagnoses/Problem List:  Patient Active Problem List   Diagnosis Date Noted  . DKA (diabetic ketoacidoses) ([place]) 06/20/2015  . AKI (acute kidney injury) ([place])   . Vitamin D deficiency disease 11/23/2014  . Change in hearing of right ear 11/09/2014  . Acute upper respiratory infections of unspecified site 04/25/2013  . Essential hypertension 12/04/2012  . Cough 09/01/2012  . Other and unspecified ovarian cyst 08/12/2012  . Pap smear for cervical cancer screening 08/12/2012  . Diabetic retinopathy ([place]) 07/12/2012  . Jaw pain 06/26/2012  . Mass of left side of neck 02/06/2012  . Breast carcinoma metastatic, skin 05/08/2011  . Cancer of left breast ([place]) 01/19/2011  . Patellofemoral syndrome, Right 03/10/2010  . THORACIC SOMATIC DYSFUNCTION 02/15/2010  . SOMATIC DYSFUNCTION, RIB CAGE 02/15/2010  . ROTATOR CUFF INJURY, LEFT SHOULDER 11/09/2009  . HOT FLASHES 06/14/2007  . Type I diabetes mellitus with complication, uncontrolled ([place]) 11/16/2006  . HYPERLIPIDEMIA 11/16/2006  . DEPRESSION, MAJOR 11/16/2006    Disposition: Home   Discharge Condition: stable   Discharge Exam:  General: Lying in bed, no issues, NAD  Cardiovascular: RRR, no murmurs  Respiratory: CTAB, no wheezes, no rhonchi  Abdomen: BS+, no ttp, no distention  Extremities: no lower extremity edema.   Brief Hospital Course:  [name] is 45 y.o. with type 1 diabetes presenting with hyperglycemia. On admission, patient's  K 5.3, AG 19, VBG pH 7.309, pCO2 31.9, pO2 46.0, Bicarb 16, UA: >1000 glucose, >80  ketones. CBG 392 on admission, however per patient had given herself a bolus of insulin prior to arrival. On admission, patient was started on insulin drip, AG closed x 4 and patient was transitioned back to insulin pump. While on insulin drip, patient's blood sugars improved very quickly. Touched base with Dr. Buddy Duty endocrinologist, who states patient has hx of non-compliance, not giving herself meal time boluses for a couple of days in row on pretty consistent basis. However, patient denied this to me on admission, slowly admitting over time to not being "the best diabetic patient" and saying she felt burnout regarding her diabetes.   After being placed on the insulin pump, patient blood sugars returned to elevated numbers in the 200- 300s. Diabetic coordinator found patient to have taken the pump off twice. Diabetic coordinator then noted that patient had run out of insulin in the pump without replacing it. There was some concern that patient was manipulating pump and when asked if this was cause patient stated that she did have a lot of stress and had been feeling depressed. Once insulin pump was correctly in place blood sugars improved to below the 200s. Patient was discharge with close endocrinology follow up and well as her PCP.   Issues for Follow Up:  1. Consider behavioral medicine consult, questionable whether patient was possibly manipulating insulin pump to not give herself insulin during this admission. She states she is depressed and currently has a lot stress ongoing in her life  2. Follow up with endocrinology, touch base to make sure she has been following as needed.   Significant Procedures: None   Significant Labs and Imaging:   Recent  Labs Lab 06/20/15 1148 06/21/15 0305  WBC 9.6 6.7  HGB 14.6 12.4  HCT 41.5 35.4*  PLT 295 218    Recent Labs Lab 06/20/15 1455 06/20/15 1822 06/20/15 2320 06/21/15 0305 06/21/15 1549  NA 134* 136 135 135 133*  K 4.6 3.7 3.7 4.1 4.1  CL  105 107 107 105 103  CO2 16* 18* 17* 18* 19*  GLUCOSE 294* 206* 140* 225* 306*  BUN 14 11 9 9 11   CREATININE 1.07* 0.74 0.75 0.83 0.99  CALCIUM 8.4* 8.2* 8.6* 8.3* 9.1     Results/Tests Pending at Time of Discharge: None   Discharge Medications:    Medication List    STOP taking these medications        ibuprofen 200 MG tablet  Commonly known as:  ADVIL,MOTRIN      TAKE these medications        atorvastatin 10 MG tablet  Commonly known as:  LIPITOR  Take 10 mg by mouth daily.     ergocalciferol 50000 units capsule  Commonly known as:  VITAMIN D2  Take 1 capsule (50,000 Units total) by mouth once a week.     glucose blood test strip  Please use OneTouch Ultra test strips. Test Blood Sugar Three Times A Day.  Dx code: E10.8.     insulin aspart 100 UNIT/ML injection  Commonly known as:  NOVOLOG  USE MAX 100 UNITS PER DAY WITH PUMP     insulin detemir 100 UNIT/ML injection  Commonly known as:  LEVEMIR  Inject 0.15 mLs (15 Units total) into the skin 2 (two) times daily. BACKUP FOR PUMP ONLY     lisinopril 10 MG tablet  Commonly known as:  PRINIVIL,ZESTRIL  TAKE ONE TABLET BY MOUTH IN THE MORNING FOR  BLOOD  PRESSURE     onetouch ultrasoft lancets  Use as instructed     venlafaxine 37.5 MG tablet  Commonly known as:  EFFEXOR  TAKE ONE TABLET BY MOUTH TWICE DAILY        Discharge Instructions: Please refer to Patient Instructions section of EMR for full details.  Patient was counseled important signs and symptoms that should prompt return to medical care, changes in medications, dietary instructions, activity restrictions, and follow up appointments.   Follow-Up Appointments: Follow-up Information    Follow up with KERR,JEFFREY, MD. Go on 06/24/2015.   Specialty:  Endocrinology   Why:  Hospital follow-up @ 8 AM    Contact information:   [address] [place] [address] [phone]       Follow up with Tawanna Sat, MD. Go on 07/06/2015.    Specialty:  Family Medicine   Why:  Hospital follow-up @ 9:45    Contact information:   [place] [address] [phone]       Tonette Bihari, MD 06/22/2015, 4:56 PM PGY-1, [place]

## 2015-06-21 NOTE — Progress Notes (Signed)
Dr. Emmaline Life notified of cbg trending up.  Pt has changed insulin pump site this morning and pump appears to be functioning properly.  Pt has bolused self based on cbg results in presence of RN.  Will continue to monitor pt closely.

## 2015-06-22 LAB — GLUCOSE, CAPILLARY
Glucose-Capillary: 101 mg/dL — ABNORMAL HIGH (ref 65–99)
Glucose-Capillary: 173 mg/dL — ABNORMAL HIGH (ref 65–99)
Glucose-Capillary: 203 mg/dL — ABNORMAL HIGH (ref 65–99)
Glucose-Capillary: 221 mg/dL — ABNORMAL HIGH (ref 65–99)
Glucose-Capillary: 94 mg/dL (ref 65–99)

## 2015-06-22 MED ORDER — INSULIN PUMP
SUBCUTANEOUS | Status: DC
  Administered 2015-06-22 (×2): via SUBCUTANEOUS
  Filled 2015-06-22: qty 1

## 2015-06-23 DIAGNOSIS — R946 Abnormal results of thyroid function studies: Secondary | ICD-10-CM | POA: Diagnosis not present

## 2015-06-23 DIAGNOSIS — E1065 Type 1 diabetes mellitus with hyperglycemia: Secondary | ICD-10-CM | POA: Diagnosis not present

## 2015-06-23 DIAGNOSIS — Z794 Long term (current) use of insulin: Secondary | ICD-10-CM | POA: Diagnosis not present

## 2015-06-24 DIAGNOSIS — M222X1 Patellofemoral disorders, right knee: Secondary | ICD-10-CM | POA: Diagnosis not present

## 2015-06-24 DIAGNOSIS — M25561 Pain in right knee: Secondary | ICD-10-CM | POA: Diagnosis not present

## 2015-06-24 DIAGNOSIS — R531 Weakness: Secondary | ICD-10-CM | POA: Diagnosis not present

## 2015-07-05 ENCOUNTER — Encounter: Payer: Self-pay | Admitting: Family Medicine

## 2015-07-05 ENCOUNTER — Ambulatory Visit (INDEPENDENT_AMBULATORY_CARE_PROVIDER_SITE_OTHER): Payer: Commercial Managed Care - PPO | Admitting: Family Medicine

## 2015-07-05 ENCOUNTER — Other Ambulatory Visit: Payer: Self-pay | Admitting: Family Medicine

## 2015-07-05 VITALS — BP 133/87 | HR 77 | Temp 98.3°F | Ht 65.0 in | Wt 205.2 lb

## 2015-07-05 DIAGNOSIS — E108 Type 1 diabetes mellitus with unspecified complications: Secondary | ICD-10-CM | POA: Diagnosis not present

## 2015-07-05 DIAGNOSIS — IMO0002 Reserved for concepts with insufficient information to code with codable children: Secondary | ICD-10-CM

## 2015-07-05 DIAGNOSIS — E1065 Type 1 diabetes mellitus with hyperglycemia: Secondary | ICD-10-CM

## 2015-07-05 LAB — POCT GLYCOSYLATED HEMOGLOBIN (HGB A1C): HEMOGLOBIN A1C: 10.7

## 2015-07-05 NOTE — Progress Notes (Signed)
   Subjective:    Patient ID: [name], female    DOB: [birth year], 45 y.o.   MRN: [id number]  HPI  CC: follow up for DKA  # Hospital follow up for DKA / T1DM:  Has seen Dr. Buddy Duty since leaving hospital, says she has also been emailing him and making adjustments outside of office visits  Reports doing "okay" with her pump... Has not been doing 4 blood sugar checks a day except for the last few days, but usually checks 2-3 times  Has had several CBG in 400 range, which she does not relate to increase sugar/carb at her meals ROS: no polyuria or polydipsia, no light headedness  Social Hx: never smoker  Review of Systems   See HPI for ROS.   Past medical history, surgical, family, and social history reviewed and updated in the EMR as appropriate. Objective:  BP 133/87 mmHg  Pulse 77  Temp(Src) 98.3 F (36.8 C) (Oral)  Ht 5\' 5"  (1.651 m)  Wt 205 lb 3.2 oz (93.078 kg)  BMI 34.15 kg/m2 Vitals and nursing note reviewed  General: no apparent distress  CV: normal rate, regular rhythm, no murmurs, rubs or gallop appreciated Resp: clear to auscultation bilaterally, normal effort  Assessment & Plan:  Type I diabetes mellitus with complication, uncontrolled ([place]) Hospitalized recently for non-adherence with her insulin pump. She has been seeing an endocrinologist for this, and has followed up since discharge. She is reporting better adherence over past week and she will continue to follow with Dr. Buddy Duty for this. Follow up 3 months    Return in about 3 months (around 10/05/2015).

## 2015-07-13 NOTE — Assessment & Plan Note (Addendum)
Hospitalized recently for non-adherence with her insulin pump. She has been seeing an endocrinologist for this, and has followed up since discharge. She is reporting better adherence over past week and she will continue to follow with Dr. Buddy Duty for this. Follow up 3 months

## 2015-11-30 ENCOUNTER — Ambulatory Visit: Payer: Commercial Managed Care - PPO | Admitting: Hematology and Oncology

## 2015-12-20 NOTE — Assessment & Plan Note (Signed)
Stage II B (T2 N2 MX) invasive ductal carcinoma of the left breast patient is status post mastectomy for multifocal disease and no positive disease largest tumor measured 2.4 cm. Tumor was ER negative PR negative HER-2/neu negative with a Ki-67 of 78%. Status post adjuvant chemotherapy on NSABP B 49 TC 6 cycles followed by adjuvant radiation and currently on observation  Breast cancer surveillance: 1. Mammogram right breast done December 2015 is normal 2. Breast exam10/31/2016 is normal 3. Elevated alkaline phosphatase: CT chest abdomen and pelvis and a bone scan were normal 02/16/2014. I believe it is related to severe vitamin D deficiency.  (Patient is a type I diabetic and is awaiting her hemoglobin A1c to decrease below 8 so that she can undergo breast reconstruction. She is going through school for hospitality management.)   Return to clinic in 1 year for follow-up

## 2015-12-24 ENCOUNTER — Ambulatory Visit (HOSPITAL_BASED_OUTPATIENT_CLINIC_OR_DEPARTMENT_OTHER): Payer: Commercial Managed Care - PPO | Admitting: Hematology and Oncology

## 2015-12-24 ENCOUNTER — Encounter: Payer: Self-pay | Admitting: Hematology and Oncology

## 2015-12-24 DIAGNOSIS — R748 Abnormal levels of other serum enzymes: Secondary | ICD-10-CM | POA: Diagnosis not present

## 2015-12-24 DIAGNOSIS — Z171 Estrogen receptor negative status [ER-]: Principal | ICD-10-CM

## 2015-12-24 DIAGNOSIS — C50412 Malignant neoplasm of upper-outer quadrant of left female breast: Secondary | ICD-10-CM

## 2015-12-24 DIAGNOSIS — Z853 Personal history of malignant neoplasm of breast: Secondary | ICD-10-CM

## 2015-12-24 DIAGNOSIS — E119 Type 2 diabetes mellitus without complications: Secondary | ICD-10-CM

## 2015-12-24 NOTE — Progress Notes (Signed)
Patient Care Team: Steve Rattler, DO as PCP - General Calvert Cantor, MD (Ophthalmology)  DIAGNOSIS:  Encounter Diagnosis  Name Primary?  . Malignant neoplasm of upper-outer quadrant of left breast in female, estrogen receptor negative ([place])     SUMMARY OF ONCOLOGIC HISTORY:   Cancer of left breast ([place])   01/19/2011 Initial Diagnosis    Cancer of left breast      01/27/2011 Procedure    Genetic testing was negative for BRCA1 and BRCA2 gene mutations      02/21/2011 Surgery    Left modified radical mastectomy with axillary lymph node dissection T2 N2 M0 ER 0%, PR 0%, HER-2 negative ratio 1.41, multifocal grade 3 largest 2.4 cm other foci 1.2, 1, 1, 0.2 cm with high-grade DCIS, and VI, 7/14 lymph nodes positive      03/22/2011 - 07/17/2011 Chemotherapy    NSABP B 49 clinical trial randomized to 6 cycles of Taxotere and Cytoxan      08/29/2011 - 10/10/2011 Radiation Therapy    Adjuvant radiation therapy      02/12/2014 Imaging    CT chest abdomen pelvis and bone scan were normal the test was done to evaluate elevated alkaline phosphatase       CHIEF COMPLIANT: Follow-up of triple negative breast cancer diagnosed in 2013  INTERVAL HISTORY: [name] is a 45 year old with above-mentioned history of left breast cancer treated with mastectomy and axillary lymph node dissection which showed 7 positive lymph nodes. She then received adjuvant chemotherapy with Taxotere and Cytoxan for 6 cycles followed by radiation and is currently on surveillance. She still has not gotten breast reconstruction because her hemoglobin A1c has not come down.  REVIEW OF SYSTEMS:   Constitutional: Denies fevers, chills or abnormal weight loss Eyes: Denies blurriness of vision Ears, nose, mouth, throat, and face: Denies mucositis or sore throat Respiratory: Denies cough, dyspnea or wheezes Cardiovascular: Denies palpitation, chest discomfort Gastrointestinal:  Denies nausea, heartburn or change  in bowel habits Skin: Denies abnormal skin rashes Lymphatics: Denies new lymphadenopathy or easy bruising Neurological:Denies numbness, tingling or new weaknesses Behavioral/Psych: Mood is stable, no new changes  Extremities: No lower extremity edema Breast:  denies any pain or lumps or nodules in either breasts All other systems were reviewed with the patient and are negative.  I have reviewed the past medical history, past surgical history, social history and family history with the patient and they are unchanged from previous note.  ALLERGIES:  is allergic to hydrocodone-acetaminophen; insulin glargine; and nph iletin i [insulin isophane, mixed nph].  MEDICATIONS:  Current Outpatient Prescriptions  Medication Sig Dispense Refill  . atorvastatin (LIPITOR) 10 MG tablet Take 10 mg by mouth daily.    . ergocalciferol (VITAMIN D2) 50000 units capsule Take 1 capsule (50,000 Units total) by mouth once a week. (Patient taking differently: Take 50,000 Units by mouth every Sunday. ) 12 capsule 3  . glucose blood test strip Please use OneTouch Ultra test strips. Test Blood Sugar Three Times A Day.  Dx code: E10.8. 100 each 12  . insulin detemir (LEVEMIR) 100 UNIT/ML injection Inject 0.15 mLs (15 Units total) into the skin 2 (two) times daily. BACKUP FOR PUMP ONLY 10 mL 0  . Lancets (ONETOUCH ULTRASOFT) lancets Use as instructed 100 each 12  . lisinopril (PRINIVIL,ZESTRIL) 10 MG tablet TAKE ONE TABLET BY MOUTH IN THE MORNING FOR  BLOOD  PRESSURE 90 tablet 3  . NOVOLOG 100 UNIT/ML injection USE MAX OF 100 UNITS PER DAY  WITH PUMP 40 mL 3  . venlafaxine (EFFEXOR) 37.5 MG tablet TAKE ONE TABLET BY MOUTH TWICE DAILY 60 tablet 1   No current facility-administered medications for this visit.     PHYSICAL EXAMINATION: ECOG PERFORMANCE STATUS: 0 - Asymptomatic  Vitals:   12/24/15 0959  BP: 119/67  Pulse: 73  Resp: 18  Temp: 98.4 F (36.9 C)   Filed Weights   12/24/15 0959  Weight: 200 lb 14.4  oz (91.1 kg)    GENERAL:alert, no distress and comfortable SKIN: skin color, texture, turgor are normal, no rashes or significant lesions EYES: normal, Conjunctiva are pink and non-injected, sclera clear OROPHARYNX:no exudate, no erythema and lips, buccal mucosa, and tongue normal  NECK: supple, thyroid normal size, non-tender, without nodularity LYMPH:  no palpable lymphadenopathy in the cervical, axillary or inguinal LUNGS: clear to auscultation and percussion with normal breathing effort HEART: regular rate & rhythm and no murmurs and no lower extremity edema ABDOMEN:abdomen soft, non-tender and normal bowel sounds MUSCULOSKELETAL:no cyanosis of digits and no clubbing  NEURO: alert & oriented x 3 with fluent speech, no focal motor/sensory deficits EXTREMITIES: No lower extremity edema BREAST: Left mastectomy no palpable lumps nodules. Right breast exam is normal.. (exam performed in the presence of a chaperone)  LABORATORY DATA:  I have reviewed the data as listed   Chemistry      Component Value Date/Time   NA 133 (L) 06/21/2015 1549   NA 142 11/23/2014 0815   K 4.1 06/21/2015 1549   K 3.7 11/23/2014 0815   CL 103 06/21/2015 1549   CL 107 07/15/2012 1021   CO2 19 (L) 06/21/2015 1549   CO2 24 11/23/2014 0815   BUN 11 06/21/2015 1549   BUN 11.3 11/23/2014 0815   CREATININE 0.99 06/21/2015 1549   CREATININE 0.62 12/31/2014 1149   CREATININE 0.9 11/23/2014 0815      Component Value Date/Time   CALCIUM 9.1 06/21/2015 1549   CALCIUM 9.3 11/23/2014 0815   ALKPHOS 210 (H) 03/31/2015 0907   ALKPHOS 160 (H) 11/23/2014 0815   AST 21 03/31/2015 0907   AST 25 11/23/2014 0815   ALT 41 03/31/2015 0907   ALT 30 11/23/2014 0815   BILITOT 1.8 (H) 03/31/2015 0907   BILITOT 0.71 11/23/2014 0815       Lab Results  Component Value Date   WBC 6.7 06/21/2015   HGB 12.4 06/21/2015   HCT 35.4 (L) 06/21/2015   MCV 80.5 06/21/2015   PLT 218 06/21/2015   NEUTROABS 7.2 06/20/2015     ASSESSMENT & PLAN:  Cancer of left breast Stage II B (T2 N2 MX) invasive ductal carcinoma of the left breast patient is status post mastectomy for multifocal disease and no positive disease largest tumor measured 2.4 cm. Tumor was ER negative PR negative HER-2/neu negative with a Ki-67 of 78%. Status post adjuvant chemotherapy on NSABP B 49 TC 6 cycles followed by adjuvant radiation and currently on observation  Breast cancer surveillance: 1. Mammogram right breast done December 2015 is normal 2. Breast exam10/31/2016 is normal 3. Elevated alkaline phosphatase: CT chest abdomen and pelvis and a bone scan were normal 02/16/2014. I believe it is related to severe vitamin D deficiency.  (Patient is a type I diabetic and is awaiting her hemoglobin A1c to decrease below 8 so that she can undergo breast reconstruction. She is going through school for hospitality management.) I discussed with her that there is no role of blood work since blood work does not  reveal any thing about breast cancer.  Return to clinic in 1 year for follow-up with survivorship clinic   No orders of the defined types were placed in this encounter.  The patient has a good understanding of the overall plan. she agrees with it. she will call with any problems that may develop before the next visit here.   Rulon Eisenmenger, MD 12/24/15

## 2016-01-18 ENCOUNTER — Other Ambulatory Visit: Payer: Self-pay | Admitting: Hematology and Oncology

## 2016-01-18 DIAGNOSIS — Z1231 Encounter for screening mammogram for malignant neoplasm of breast: Secondary | ICD-10-CM

## 2016-01-18 DIAGNOSIS — Z9012 Acquired absence of left breast and nipple: Secondary | ICD-10-CM

## 2016-01-27 ENCOUNTER — Telehealth: Payer: Self-pay | Admitting: *Deleted

## 2016-01-27 ENCOUNTER — Other Ambulatory Visit: Payer: Self-pay | Admitting: Family Medicine

## 2016-01-27 NOTE — Telephone Encounter (Signed)
LM ok per DPR asking her to call to clinic and schedule an appointment to discuss diabetes with pcp. Minami Arriaga,CMA

## 2016-01-27 NOTE — Telephone Encounter (Signed)
-----   Message from Steve Rattler, DO sent at 01/27/2016  4:06 PM EST ----- Regarding: needs appt  [name] needs to be seen to check on her DM, can you call and make her an appointment? Will refill novolog today.

## 2016-02-14 DIAGNOSIS — R5383 Other fatigue: Secondary | ICD-10-CM | POA: Diagnosis not present

## 2016-02-14 DIAGNOSIS — J069 Acute upper respiratory infection, unspecified: Secondary | ICD-10-CM | POA: Diagnosis not present

## 2016-02-14 DIAGNOSIS — E7800 Pure hypercholesterolemia, unspecified: Secondary | ICD-10-CM | POA: Diagnosis not present

## 2016-02-14 DIAGNOSIS — E1042 Type 1 diabetes mellitus with diabetic polyneuropathy: Secondary | ICD-10-CM | POA: Diagnosis not present

## 2016-02-14 DIAGNOSIS — N951 Menopausal and female climacteric states: Secondary | ICD-10-CM | POA: Diagnosis not present

## 2016-02-14 DIAGNOSIS — I10 Essential (primary) hypertension: Secondary | ICD-10-CM | POA: Diagnosis not present

## 2016-02-17 ENCOUNTER — Ambulatory Visit
Admission: RE | Admit: 2016-02-17 | Discharge: 2016-02-17 | Disposition: A | Discharge status: Home / Self Care | Payer: Commercial Managed Care - PPO | Source: Ambulatory Visit | Attending: Hematology and Oncology | Admitting: Hematology and Oncology

## 2016-02-17 ENCOUNTER — Ambulatory Visit: Payer: Commercial Managed Care - PPO

## 2016-02-17 DIAGNOSIS — Z1231 Encounter for screening mammogram for malignant neoplasm of breast: Secondary | ICD-10-CM | POA: Diagnosis not present

## 2016-02-17 DIAGNOSIS — Z9012 Acquired absence of left breast and nipple: Secondary | ICD-10-CM

## 2016-03-15 DIAGNOSIS — E065 Other chronic thyroiditis: Secondary | ICD-10-CM | POA: Diagnosis not present

## 2016-03-15 DIAGNOSIS — Z794 Long term (current) use of insulin: Secondary | ICD-10-CM | POA: Diagnosis not present

## 2016-03-15 DIAGNOSIS — E1065 Type 1 diabetes mellitus with hyperglycemia: Secondary | ICD-10-CM | POA: Diagnosis not present

## 2016-03-23 ENCOUNTER — Observation Stay (HOSPITAL_COMMUNITY)
Admission: EM | Admit: 2016-03-23 | Discharge: 2016-03-23 | Disposition: A | Discharge status: Home / Self Care | Payer: Commercial Managed Care - PPO | Attending: Family Medicine | Admitting: Family Medicine

## 2016-03-23 ENCOUNTER — Encounter (HOSPITAL_COMMUNITY): Payer: Self-pay | Admitting: Emergency Medicine

## 2016-03-23 ENCOUNTER — Inpatient Hospital Stay (HOSPITAL_COMMUNITY): Payer: Commercial Managed Care - PPO

## 2016-03-23 DIAGNOSIS — Z90710 Acquired absence of both cervix and uterus: Secondary | ICD-10-CM | POA: Insufficient documentation

## 2016-03-23 DIAGNOSIS — I10 Essential (primary) hypertension: Secondary | ICD-10-CM | POA: Diagnosis not present

## 2016-03-23 DIAGNOSIS — R05 Cough: Secondary | ICD-10-CM | POA: Insufficient documentation

## 2016-03-23 DIAGNOSIS — Z803 Family history of malignant neoplasm of breast: Secondary | ICD-10-CM | POA: Diagnosis not present

## 2016-03-23 DIAGNOSIS — Z794 Long term (current) use of insulin: Secondary | ICD-10-CM | POA: Insufficient documentation

## 2016-03-23 DIAGNOSIS — Z808 Family history of malignant neoplasm of other organs or systems: Secondary | ICD-10-CM | POA: Diagnosis not present

## 2016-03-23 DIAGNOSIS — Z923 Personal history of irradiation: Secondary | ICD-10-CM | POA: Diagnosis not present

## 2016-03-23 DIAGNOSIS — Z79899 Other long term (current) drug therapy: Secondary | ICD-10-CM | POA: Diagnosis not present

## 2016-03-23 DIAGNOSIS — Z885 Allergy status to narcotic agent status: Secondary | ICD-10-CM | POA: Insufficient documentation

## 2016-03-23 DIAGNOSIS — Z820 Family history of epilepsy and other diseases of the nervous system: Secondary | ICD-10-CM | POA: Insufficient documentation

## 2016-03-23 DIAGNOSIS — R110 Nausea: Secondary | ICD-10-CM | POA: Diagnosis not present

## 2016-03-23 DIAGNOSIS — Z8042 Family history of malignant neoplasm of prostate: Secondary | ICD-10-CM | POA: Diagnosis not present

## 2016-03-23 DIAGNOSIS — Z853 Personal history of malignant neoplasm of breast: Secondary | ICD-10-CM | POA: Diagnosis not present

## 2016-03-23 DIAGNOSIS — E1010 Type 1 diabetes mellitus with ketoacidosis without coma: Principal | ICD-10-CM | POA: Insufficient documentation

## 2016-03-23 DIAGNOSIS — Z888 Allergy status to other drugs, medicaments and biological substances status: Secondary | ICD-10-CM | POA: Insufficient documentation

## 2016-03-23 DIAGNOSIS — R059 Cough, unspecified: Secondary | ICD-10-CM

## 2016-03-23 DIAGNOSIS — E785 Hyperlipidemia, unspecified: Secondary | ICD-10-CM | POA: Insufficient documentation

## 2016-03-23 DIAGNOSIS — Z9641 Presence of insulin pump (external) (internal): Secondary | ICD-10-CM | POA: Insufficient documentation

## 2016-03-23 DIAGNOSIS — F329 Major depressive disorder, single episode, unspecified: Secondary | ICD-10-CM | POA: Diagnosis not present

## 2016-03-23 DIAGNOSIS — Z833 Family history of diabetes mellitus: Secondary | ICD-10-CM | POA: Insufficient documentation

## 2016-03-23 DIAGNOSIS — N179 Acute kidney failure, unspecified: Secondary | ICD-10-CM | POA: Diagnosis present

## 2016-03-23 DIAGNOSIS — Z9012 Acquired absence of left breast and nipple: Secondary | ICD-10-CM | POA: Insufficient documentation

## 2016-03-23 DIAGNOSIS — E1110 Type 2 diabetes mellitus with ketoacidosis without coma: Secondary | ICD-10-CM | POA: Diagnosis present

## 2016-03-23 DIAGNOSIS — Z8249 Family history of ischemic heart disease and other diseases of the circulatory system: Secondary | ICD-10-CM | POA: Insufficient documentation

## 2016-03-23 DIAGNOSIS — Z9221 Personal history of antineoplastic chemotherapy: Secondary | ICD-10-CM | POA: Insufficient documentation

## 2016-03-23 LAB — CBG MONITORING, ED
GLUCOSE-CAPILLARY: 189 mg/dL — AB (ref 65–99)
GLUCOSE-CAPILLARY: 226 mg/dL — AB (ref 65–99)
GLUCOSE-CAPILLARY: 224 mg/dL — AB (ref 65–99)
Glucose-Capillary: 460 mg/dL — ABNORMAL HIGH (ref 65–99)
Glucose-Capillary: 350 mg/dL — ABNORMAL HIGH (ref 65–99)
Glucose-Capillary: 307 mg/dL — ABNORMAL HIGH (ref 65–99)
Glucose-Capillary: 224 mg/dL — ABNORMAL HIGH (ref 65–99)
Glucose-Capillary: 160 mg/dL — ABNORMAL HIGH (ref 65–99)
Glucose-Capillary: 120 mg/dL — ABNORMAL HIGH (ref 65–99)
Glucose-Capillary: 115 mg/dL — ABNORMAL HIGH (ref 65–99)
Glucose-Capillary: 111 mg/dL — ABNORMAL HIGH (ref 65–99)

## 2016-03-23 LAB — BASIC METABOLIC PANEL
ANION GAP: 17 — AB (ref 5–15)
Anion gap: 11 (ref 5–15)
Anion gap: 6 (ref 5–15)
Anion gap: 8 (ref 5–15)
BUN: 19 mg/dL (ref 6–20)
BUN: 15 mg/dL (ref 6–20)
BUN: 12 mg/dL (ref 6–20)
BUN: 11 mg/dL (ref 6–20)
CHLORIDE: 105 mmol/L (ref 101–111)
CHLORIDE: 111 mmol/L (ref 101–111)
CHLORIDE: 108 mmol/L (ref 101–111)
CO2: 17 mmol/L — AB (ref 22–32)
CO2: 19 mmol/L — ABNORMAL LOW (ref 22–32)
CO2: 22 mmol/L (ref 22–32)
CO2: 23 mmol/L (ref 22–32)
CREATININE: 0.94 mg/dL (ref 0.44–1.00)
CREATININE: 0.74 mg/dL (ref 0.44–1.00)
CREATININE: 0.76 mg/dL (ref 0.44–1.00)
Calcium: 9.8 mg/dL (ref 8.9–10.3)
Calcium: 9 mg/dL (ref 8.9–10.3)
Calcium: 9 mg/dL (ref 8.9–10.3)
Calcium: 9 mg/dL (ref 8.9–10.3)
Chloride: 97 mmol/L — ABNORMAL LOW (ref 101–111)
Creatinine, Ser: 1.17 mg/dL — ABNORMAL HIGH (ref 0.44–1.00)
GFR calc Af Amer: >60 mL/min (ref >60)
GFR calc Af Amer: >60 mL/min (ref >60)
GFR calc Af Amer: >60 mL/min (ref >60)
GFR calc non Af Amer: 55 mL/min — ABNORMAL LOW (ref >60)
GFR calc non Af Amer: >60 mL/min (ref >60)
GFR calc non Af Amer: >60 mL/min (ref >60)
GFR calc non Af Amer: >60 mL/min (ref >60)
Glucose, Bld: 539 mg/dL (ref 65–99)
Glucose, Bld: 246 mg/dL — ABNORMAL HIGH (ref 65–99)
Glucose, Bld: 138 mg/dL — ABNORMAL HIGH (ref 65–99)
Glucose, Bld: 235 mg/dL — ABNORMAL HIGH (ref 65–99)
POTASSIUM: 4.3 mmol/L (ref 3.5–5.1)
Potassium: 4 mmol/L (ref 3.5–5.1)
Potassium: 4.3 mmol/L (ref 3.5–5.1)
Potassium: 4.5 mmol/L (ref 3.5–5.1)
SODIUM: 135 mmol/L (ref 135–145)
SODIUM: 139 mmol/L (ref 135–145)
Sodium: 131 mmol/L — ABNORMAL LOW (ref 135–145)
Sodium: 139 mmol/L (ref 135–145)

## 2016-03-23 LAB — CBC
HCT: 39.9 % (ref 36.0–46.0)
HEMOGLOBIN: 13.8 g/dL (ref 12.0–15.0)
MCH: 28.7 pg (ref 26.0–34.0)
MCHC: 34.6 g/dL (ref 30.0–36.0)
MCV: 83 fL (ref 78.0–100.0)
Platelets: 270 10*3/uL (ref 150–400)
RBC: 4.81 MIL/uL (ref 3.87–5.11)
RDW: 12.5 % (ref 11.5–15.5)
WBC: 7.7 10*3/uL (ref 4.0–10.5)

## 2016-03-23 LAB — URINALYSIS, ROUTINE W REFLEX MICROSCOPIC
Bacteria, UA: NONE SEEN
Bilirubin Urine: NEGATIVE
Hgb urine dipstick: NEGATIVE
KETONES UR: 80 mg/dL — AB
LEUKOCYTES UA: NEGATIVE
Nitrite: NEGATIVE
PROTEIN: NEGATIVE mg/dL
Specific Gravity, Urine: 1.023 (ref 1.005–1.030)
pH: 5 (ref 5.0–8.0)

## 2016-03-23 LAB — HIV ANTIBODY (ROUTINE TESTING W REFLEX): HIV Screen 4th Generation wRfx: NONREACTIVE

## 2016-03-23 MED ORDER — "INSULIN SYRINGE 30G X 5/16"" 0.3 ML MISC": 0.1500 mL | Freq: Once | 0 refills | Status: AC

## 2016-03-23 MED ORDER — INSULIN PUMP
Freq: Once | SUBCUTANEOUS | Status: DC
  Filled 2016-03-23: qty 1

## 2016-03-23 MED ORDER — SODIUM CHLORIDE 0.9 % IV BOLUS (SEPSIS)
1000.0000 mL | Freq: Once | INTRAVENOUS | Status: AC
  Administered 2016-03-23: 1000 mL via INTRAVENOUS

## 2016-03-23 MED ORDER — SODIUM CHLORIDE 0.9 % IV SOLN
INTRAVENOUS | Status: DC
  Administered 2016-03-23: 2.9 [IU]/h via INTRAVENOUS
  Administered 2016-03-23: 4.9 [IU]/h via INTRAVENOUS
  Filled 2016-03-23: qty 2.5

## 2016-03-23 MED ORDER — SODIUM CHLORIDE 0.9 % IV SOLN: INTRAVENOUS | Status: DC

## 2016-03-23 MED ORDER — INSULIN DETEMIR 100 UNIT/ML ~~LOC~~ SOLN: 15.0000 [IU] | Freq: Every day | SUBCUTANEOUS | 11 refills | Status: DC

## 2016-03-23 MED ORDER — ENOXAPARIN SODIUM 40 MG/0.4ML ~~LOC~~ SOLN
40.0000 mg | SUBCUTANEOUS | Status: DC
  Filled 2016-03-23: qty 0.4

## 2016-03-23 MED ORDER — INSULIN ASPART 100 UNIT/ML ~~LOC~~ SOLN
10.0000 [IU] | Freq: Once | SUBCUTANEOUS | Status: AC
  Administered 2016-03-23: 10 [IU] via INTRAVENOUS
  Filled 2016-03-23: qty 1

## 2016-03-23 MED ORDER — LISINOPRIL 10 MG PO TABS: 10.0000 mg | ORAL_TABLET | Freq: Every day | ORAL | Status: DC

## 2016-03-23 MED ORDER — ONDANSETRON HCL 4 MG/2ML IJ SOLN: 4.0000 mg | Freq: Four times a day (QID) | INTRAMUSCULAR | Status: DC | PRN

## 2016-03-23 MED ORDER — DEXTROSE-NACL 5-0.45 % IV SOLN
INTRAVENOUS | Status: DC
  Administered 2016-03-23: 08:00:00 via INTRAVENOUS

## 2016-03-23 MED ORDER — SODIUM CHLORIDE 0.9 % IV BOLUS (SEPSIS): 500.0000 mL | Freq: Once | INTRAVENOUS | Status: DC

## 2016-03-23 MED ORDER — INSULIN DETEMIR 100 UNIT/ML ~~LOC~~ SOLN: 15.0000 [IU] | Freq: Two times a day (BID) | SUBCUTANEOUS | 0 refills | Status: DC

## 2016-03-23 MED ORDER — POTASSIUM CHLORIDE CRYS ER 20 MEQ PO TBCR
40.0000 meq | EXTENDED_RELEASE_TABLET | Freq: Once | ORAL | Status: AC
  Administered 2016-03-23: 40 meq via ORAL
  Filled 2016-03-23: qty 2

## 2016-03-23 MED ORDER — ONDANSETRON HCL 4 MG/2ML IJ SOLN
4.0000 mg | Freq: Once | INTRAMUSCULAR | Status: AC
  Administered 2016-03-23: 4 mg via INTRAVENOUS
  Filled 2016-03-23: qty 2

## 2016-03-23 MED ORDER — INSULIN DETEMIR 100 UNIT/ML ~~LOC~~ SOLN: 15.0000 [IU] | Freq: Every day | SUBCUTANEOUS | Status: DC

## 2016-03-23 MED ORDER — HYDRALAZINE HCL 20 MG/ML IJ SOLN: 5.0000 mg | INTRAMUSCULAR | Status: DC | PRN

## 2016-03-23 MED ORDER — VENLAFAXINE HCL ER 37.5 MG PO CP24
37.5000 mg | ORAL_CAPSULE | Freq: Every day | ORAL | Status: DC
  Administered 2016-03-23: 37.5 mg via ORAL
  Filled 2016-03-23 (×2): qty 1

## 2016-03-23 MED ORDER — ATORVASTATIN CALCIUM 10 MG PO TABS
10.0000 mg | ORAL_TABLET | Freq: Every day | ORAL | Status: DC
  Filled 2016-03-23: qty 1

## 2016-03-23 MED ORDER — SODIUM CHLORIDE 0.9 % IV SOLN
INTRAVENOUS | Status: DC
  Administered 2016-03-23: 3.3 [IU]/h via INTRAVENOUS
  Filled 2016-03-23: qty 2.5

## 2016-03-23 NOTE — ED Notes (Signed)
Karen Black, NP at bedside 

## 2016-03-23 NOTE — H&P (Signed)
History and Physical    [name] [id number] DOB: 04-Sep-1970 DOA: 03/23/2016  PCP: Sadie Haber Patient coming from: home  Chief Complaint: hyperglycemia/nause  HPI: [name] is a very pleasant 46 y.o. female with medical history significant for HTN, breast cancer, diabetes type 1 on insulin pump hyperglycemia and nausea. Initial evaluation reveals DKA serum glucose of 539, an ion gap 17 bicarbonate 17.  Information is obtained from the patient. She reports that her blood sugar was gradually creeping up in spite of her wearing her insulin pump. She reports compliance with her meal boluses. She denies any recent pump changes tubing changes changes in suppliers. By way of problem solving she changes the tubing changed the site with no improvement. She reports only when she changed the insulin that her blood sugar start to come down. She came to the emergency department for fear she had not gotten control of her blood sugars in time to avoid DKA. Associated symptoms include nausea without vomiting. She denies headache dizziness syncope or near-syncope. She denies chest pain palpitation shortness of breath lower extremity edema or orthopnea. She denies dominant pain diarrhea constipation dysuria hematuria frequency or urgency. She reports a mild cough nonproductive. States he recently transitioned to [facility]. At the time of admission she reports feeling "much better" with nausea resolved   ED Course: The emergency department she's afebrile hemodynamically stable and not hypoxic. She is given 2 L of normal saline bolus and insulin drip is initiated. Is also provided with Zofran for nausea  Review of Systems: As per HPI otherwise 10 point review of systems negative.   Ambulatory Status: Relates independently independent with ADLs  Past Medical History:  Diagnosis Date  . Allergy   . Blood clot in vein    nonocclusive clot right subclavian vein, on Xarelto for 6 months  .  Breast cancer ([place])    T2N2 tiple negative left breast ca   . Breast cancer ([place])   . Breast mass in female    fibrocystic changes  . Cataracts, bilateral   . Diabetes mellitus    insulin dependant - uses pump, Dr. Dwyane Dee  . History of chemotherapy    taxotere/cytoxan with neulasta support  . Hyperlipidemia   . Hypertension   . Lymphedema of arm    left upper extremity  . Nausea   . Nausea & vomiting    for approx. 2 weeks  . Radiation 08/21/11-10/05/11   Left chestwll/Supraclav./left PAB/left scar  . S/P chemotherapy, time since 4-12 weeks     Past Surgical History:  Procedure Laterality Date  . ABDOMINAL HYSTERECTOMY  2002   still has ovaries   . bladder tacking  01/2009  . left mastectomy  02/21/2011  . MASS EXCISION  02/19/2012   Procedure: MINOR EXCISION OF MASS;  Surgeon: Adin Hector, MD;  Location: [facility];  Service: General;  Laterality: Left;  Marland Kitchen MASTECTOMY MODIFIED RADICAL  02/21/11   left,invasive  multifocal grade III ductal ca,high grade dcis,lymph/vascular invasion invasion (7/14) nodes positive mets  . PORT-A-CATH REMOVAL  10/13/2011   Procedure: MINOR REMOVAL PORT-A-CATH;  Surgeon: Adin Hector, MD;  Location: [facility];  Service: General;  Laterality: Right;  PORT-A CATH REMOVAL  . PORTACATH PLACEMENT  02/21/2011   Procedure: INSERTION PORT-A-CATH;  Surgeon: Adin Hector, MD;  Location: [facility];  Service: General;  Laterality: N/A;  . SHOULDER SURGERY  04/26/10   left  . TUBAL LIGATION  12/1999  Social History   Social History  . Marital status: Married    Spouse name: N/A  . Number of children: N/A  . Years of education: N/A   Occupational History  . Not on file.   Social History Main Topics  . Smoking status: Never Smoker  . Smokeless tobacco: Never Used  . Alcohol use No  . Drug use: No  . Sexual activity: Yes     Comment: menses age 1, 27st preganancy age 83, G28p3   Other Topics Concern  . Not on  file   Social History Narrative  . No narrative on file   She lives at home with her husband. 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 Hydrocodone-Acetaminophen Itching and Swelling  . Insulin Glargine Itching, Nausea And Vomiting and Swelling  . Nph Iletin I [Insulin Isophane, Mixed Nph] Itching, Nausea And Vomiting and Swelling  . Lantus [Insulin Glargine] Nausea And Vomiting and Swelling    Family History  Problem Relation Age of Onset  . Cancer Maternal Aunt     breast  . Cancer Maternal Uncle 50     right eye removed s/p cancer  . Hypertension    . Diabetes    . Deep vein thrombosis    . Kidney disease    . Cancer Maternal Grandfather     prostate ca    Prior to Admission medications   Medication Sig Start Date End Date Taking? Authorizing Provider  atorvastatin (LIPITOR) 10 MG tablet Take 10 mg by mouth daily. 06/01/15  Yes Historical Provider, MD  ergocalciferol (VITAMIN D2) 50000 units capsule Take 1 capsule (50,000 Units total) by mouth once a week. Patient taking differently: Take 50,000 Units by mouth every Sunday.  03/08/15  Yes Nicholas Lose, MD  insulin detemir (LEVEMIR) 100 UNIT/ML injection Inject 0.15 mLs (15 Units total) into the skin 2 (two) times daily. BACKUP FOR PUMP ONLY 04/02/15  Yes Hillary Corinda Gubler, MD  Prenatal Vit-Fe Fumarate-FA (PRENATAL PO) Take 1 tablet by mouth daily.   Yes Historical Provider, MD  venlafaxine XR (EFFEXOR-XR) 37.5 MG 24 hr capsule Take 37.5 mg by mouth daily with breakfast.   Yes Historical Provider, MD  lisinopril (PRINIVIL,ZESTRIL) 10 MG tablet TAKE ONE TABLET BY MOUTH IN THE MORNING FOR  BLOOD  PRESSURE 04/17/14   Leone Brand, MD  NOVOLOG 100 UNIT/ML injection USE MAX OF 100 UNITS PER DAY WITH PUMP 01/27/16   Steve Rattler, DO    Physical Exam: Vitals:   03/23/16 0615 03/23/16 0616 03/23/16 0630 03/23/16 0700  BP: (!) 107/51 (!) 107/51 111/55 113/87  Pulse:  91 95 77 94  Resp: 19 22 15 17   Temp:      TempSrc:      SpO2: 96% 99% 99% 99%  Weight:      Height:         General:  Appears calm and comfortable, smiling in no acute distress Eyes:  PERRL, EOMI, normal lids, iris ENT:  grossly normal hearing, lips & tongue, because membranes of her mouth are pink slightly dry Neck:  no LAD, masses or thyromegaly Cardiovascular:  RRR, no m/r/g. No LE edema. Pedal pulses present and palpable Respiratory:  CTA bilaterally, no w/r/r. Normal respiratory effort. Abdomen:  soft, ntnd, positive bowel sounds but very sluggish. No guarding or rebounding Skin:  no rash or induration seen on limited exam Musculoskeletal:  grossly normal tone BUE/BLE, good ROM,  no bony abnormality Psychiatric:  grossly normal mood and affect, speech fluent and appropriate, AOx3 Neurologic:  CN 2-12 grossly intact, moves all extremities in coordinated fashion, sensation intact  Labs on Admission: I have personally reviewed following labs and imaging studies  CBC:  Recent Labs Lab 03/23/16 0421  WBC 7.7  HGB 13.8  HCT 39.9  MCV 83.0  PLT [id number]   Basic Metabolic Panel:  Recent Labs Lab 03/23/16 0421  NA 131*  K 4.3  CL 97*  CO2 17*  GLUCOSE 539*  BUN 19  CREATININE 1.17*  CALCIUM 9.8   GFR: Estimated Creatinine Clearance: 65.5 mL/min (by C-G formula based on SCr of 1.17 mg/dL (H)). Liver Function Tests: No results for input(s): AST, ALT, ALKPHOS, BILITOT, PROT, ALBUMIN in the last 168 hours. No results for input(s): LIPASE, AMYLASE in the last 168 hours. No results for input(s): AMMONIA in the last 168 hours. Coagulation Profile: No results for input(s): INR, PROTIME in the last 168 hours. Cardiac Enzymes: No results for input(s): CKTOTAL, CKMB, CKMBINDEX, TROPONINI in the last 168 hours. BNP (last 3 results) No results for input(s): PROBNP in the last 8760 hours. HbA1C: No results for input(s): HGBA1C in the last 72 hours. CBG:  Recent Labs Lab  03/23/16 0419 03/23/16 0538 03/23/16 0635  GLUCAP 460* 350* 307*   Lipid Profile: No results for input(s): CHOL, HDL, LDLCALC, TRIG, CHOLHDL, LDLDIRECT in the last 72 hours. Thyroid Function Tests: No results for input(s): TSH, T4TOTAL, FREET4, T3FREE, THYROIDAB in the last 72 hours. Anemia Panel: No results for input(s): VITAMINB12, FOLATE, FERRITIN, TIBC, IRON, RETICCTPCT in the last 72 hours. Urine analysis:    Component Value Date/Time   COLORURINE STRAW (A) 03/23/2016 0449   APPEARANCEUR CLEAR 03/23/2016 0449   LABSPEC 1.023 03/23/2016 0449   PHURINE 5.0 03/23/2016 0449   GLUCOSEU >=500 (A) 03/23/2016 0449   GLUCOSEU 500 (A) 02/04/2013 0854   HGBUR NEGATIVE 03/23/2016 0449   HGBUR negative 07/27/2008 0916   BILIRUBINUR NEGATIVE 03/23/2016 0449   KETONESUR 80 (A) 03/23/2016 0449   PROTEINUR NEGATIVE 03/23/2016 0449   UROBILINOGEN 1.0 11/30/2014 0457   NITRITE NEGATIVE 03/23/2016 0449   LEUKOCYTESUR NEGATIVE 03/23/2016 0449    Creatinine Clearance: Estimated Creatinine Clearance: 65.5 mL/min (by C-G formula based on SCr of 1.17 mg/dL (H)).  Sepsis Labs: @LABRCNTIP (procalcitonin:4,lacticidven:4) )No results found for this or any previous visit (from the past 240 hour(s)).   Radiological Exams on Admission: No results found.  EKG:   Assessment/Plan Principal Problem:   DKA (diabetic ketoacidoses) (HCC) Active Problems:   Major depressive disorder, single episode   Essential hypertension   AKI (acute kidney injury) ([place])    #1. DKA/hyperglycemia in a type I diabetic. Likely related to malfunction and or user error. No signs of infection. Serum glucose 538 an ion gap 17 bicarbonate 17 urine with keytones and glucose. Chart review indicates hemoglobin A1c 8 months ago 10.7. Of note patient has a history of hospitalizations for same with no obvious illness or inciting event. Notes indicate some concern for ongoing noncompliance and/or manipulation of calm. -Admit to  step down -Continue insulin drip -Continue vigorous IV fluids -Transition fluids to D5 half-normal saline once BG less than 250 per protocol -bmet every 4 hours per protocol -Potassium supplementation per protocol. On admission potassium level within the limits of normal -Transition to subcutaneous insulin once gap closed 3 -Hold insulin pump for now. Plan to resume once an ion gap closed versus protocols Levemir -HH carb modified  diet as tolerated -Diabetes coordinator consult  2. Acute kidney injury. Likely related to above. Mild. -IV fluids as noted above -Monitor urine output -Recheck in the morning  #3. Hypertension. Blood pressure on the high end of normal. Home medications include lisinopril. -Hold lisinopril for now secondary to #2 -plan restart Lisinopril in the morning -prn hydralazine  #4. Hyperlipidemia. -Continue home meds  #5. Depression. -Continue home meds -previous hospitalizations note some concern for depression correlated with tendency to non-compliance with Diabetes. -consider [facility] OP consult?  DVT prophylaxis: lovenox Code Status: full  Family Communication: husband at bedside  Disposition Plan: home hopefully 24 hours  Consults called: diabetes coordinator  Admission status: inpatient    Radene Gunning MD [facility]  If 7PM-7AM, please contact night-coverage [email or web address] Password [facility]  03/23/2016, 7:31 AM

## 2016-03-23 NOTE — ED Provider Notes (Signed)
TIME SEEN: 4:30 AM  CHIEF COMPLAINT: Hyperglycemia, nausea  HPI: Patient is a 46 year old female with history of hypertension, hyperlipidemia, previous history of left-sided breast cancer, insulin-dependent diabetes with insulin pump who presents emergency department hyperglycemia. She states that she was having problems getting her insulin pump to work tonight and kept stating that it had an "occlusion". States she readjusted the pump, changed position of placement of the needle in her abdomen, changed the tubing and then ultimately changed the insulin that she was using and then the pump began to work again. She did bolus herself multiple times throughout the day but states her blood sugar was reading "high" at home just PTA. She has been feeling nauseous and feels like she may be on the verge of DKA. No vomiting. No fevers, cough, diarrhea, abdominal pain.  ROS: See HPI Constitutional: no fever  Eyes: no drainage  ENT: no runny nose   Cardiovascular:  no chest pain  Resp: no SOB  GI: no vomiting GU: no dysuria Integumentary: no rash  Allergy: no hives  Musculoskeletal: no leg swelling  Neurological: no slurred speech ROS otherwise negative  PAST MEDICAL HISTORY/PAST SURGICAL HISTORY:  Past Medical History:  Diagnosis Date  . Allergy   . Blood clot in vein    nonocclusive clot right subclavian vein, on Xarelto for 6 months  . Breast cancer ([place])    T2N2 tiple negative left breast ca   . Breast cancer ([place])   . Breast mass in female    fibrocystic changes  . Cataracts, bilateral   . Diabetes mellitus    insulin dependant - uses pump, Dr. Dwyane Dee  . History of chemotherapy    taxotere/cytoxan with neulasta support  . Hyperlipidemia   . Hypertension   . Lymphedema of arm    left upper extremity  . Nausea   . Nausea & vomiting    for approx. 2 weeks  . Radiation 08/21/11-10/05/11   Left chestwll/Supraclav./left PAB/left scar  . S/P chemotherapy, time since 4-12 weeks      MEDICATIONS:  Prior to Admission medications   Medication Sig Start Date End Date Taking? Authorizing Provider  atorvastatin (LIPITOR) 10 MG tablet Take 10 mg by mouth daily. 06/01/15   Historical Provider, MD  ergocalciferol (VITAMIN D2) 50000 units capsule Take 1 capsule (50,000 Units total) by mouth once a week. Patient taking differently: Take 50,000 Units by mouth every Sunday.  03/08/15   Nicholas Lose, MD  glucose blood test strip Please use OneTouch Ultra test strips. Test Blood Sugar Three Times A Day.  Dx code: E10.8. 04/09/15   Dickie La, MD  insulin detemir (LEVEMIR) 100 UNIT/ML injection Inject 0.15 mLs (15 Units total) into the skin 2 (two) times daily. BACKUP FOR PUMP ONLY 04/02/15   Rogue Bussing, MD  Lancets [facility] ULTRASOFT) lancets Use as instructed 04/02/15   Rogue Bussing, MD  lisinopril (PRINIVIL,ZESTRIL) 10 MG tablet TAKE ONE TABLET BY MOUTH IN THE MORNING FOR  BLOOD  PRESSURE 04/17/14   Leone Brand, MD  NOVOLOG 100 UNIT/ML injection USE MAX OF 100 UNITS PER DAY WITH PUMP 01/27/16   Steve Rattler, DO  venlafaxine (EFFEXOR) 37.5 MG tablet TAKE ONE TABLET BY MOUTH TWICE DAILY 04/14/15   Nicholas Lose, MD    ALLERGIES:  Allergies  Allergen Reactions  . Hydrocodone-Acetaminophen Itching and Swelling  . Insulin Glargine Itching, Nausea And Vomiting and Swelling  . Nph Iletin I [Insulin Isophane, Mixed Nph] Itching, Nausea And Vomiting  and Swelling    SOCIAL HISTORY:  Social History  Substance Use Topics  . Smoking status: Never Smoker  . Smokeless tobacco: Never Used  . Alcohol use No    FAMILY HISTORY: Family History  Problem Relation Age of Onset  . Cancer Maternal Aunt     breast  . Cancer Maternal Uncle 50     right eye removed s/p cancer  . Hypertension    . Diabetes    . Deep vein thrombosis    . Kidney disease    . Cancer Maternal Grandfather     prostate ca    EXAM: BP 143/82 (BP Location: Right Arm)   Pulse 92   Temp  98.1 F (36.7 C) (Oral)   Resp 22   Ht 5\' 5"  (1.651 m)   Wt 196 lb 8 oz (89.1 kg)   SpO2 98%   BMI 32.70 kg/m  CONSTITUTIONAL: Alert and oriented and responds appropriately to questions. Well-appearing; well-nourished HEAD: Normocephalic EYES: Conjunctivae clear, PERRL, EOMI ENT: normal nose; no rhinorrhea; moist mucous membranes NECK: Supple, no meningismus, no nuchal rigidity, no LAD  CARD: RRR; S1 and S2 appreciated; no murmurs, no clicks, no rubs, no gallops RESP: Normal chest excursion without splinting or tachypnea; breath sounds clear and equal bilaterally; no wheezes, no rhonchi, no rales, no hypoxia or respiratory distress, speaking full sentences ABD/GI: Normal bowel sounds; non-distended; soft, non-tender, no rebound, no guarding, no peritoneal signs, no hepatosplenomegaly; insulin pump to the lower abdomen BACK:  The back appears normal and is non-tender to palpation, there is no CVA tenderness EXT: Normal ROM in all joints; non-tender to palpation; no edema; normal capillary refill; no cyanosis, no calf tenderness or swelling    SKIN: Normal color for age and race; warm; no rash NEURO: Moves all extremities equally, sensation to light touch intact diffusely, cranial nerves II through XII intact, normal speech PSYCH: The patient's mood and manner are appropriate. Grooming and personal hygiene are appropriate.  MEDICAL DECISION MAKING: Patient here with hyperglycemia. Having some nausea and states she feels that she could be "on the verge of DKA". Very well-appearing otherwise and hemodynamically stable. We'll give IV fluids, IV insulin. We'll obtain labs, urinalysis.  ED PROGRESS: 5:20 AM  Patient's labs show bicarbonate of 17, anion gap of 17 and large ketones in her urine. Given patient is in DKA, will start insulin drip and admit. Patient reports she was previously of family medicine patient but just establish care with [facility] [place] [facility].   5:50 AM  D/w Dr. Olevia Bowens with  hospitalist service. He will place admission orders. Patient will be seen by oncoming daytime hospitalist team. She is stable. Patient and husband updated with plan.  I reviewed all nursing notes, vitals, pertinent old records, EKGs, labs, imaging (as available).   CRITICAL CARE Performed by: Nyra Jabs   Total critical care time: 45 minutes  Critical care time was exclusive of separately billable procedures and treating other patients.  Critical care was necessary to treat or prevent imminent or life-threatening deterioration.  Critical care was time spent personally by me on the following activities: development of treatment plan with patient and/or surrogate as well as nursing, discussions with consultants, evaluation of patient's response to treatment, examination of patient, obtaining history from patient or surrogate, ordering and performing treatments and interventions, ordering and review of laboratory studies, ordering and review of radiographic studies, pulse oximetry and re-evaluation of patient's condition.      [place], DO  03/23/16 0552  

## 2016-03-23 NOTE — Progress Notes (Signed)
Was paged by the staff RN with a question about when to stop insulin drip.  Told staff RN that CBGs need to be within range for at least 4 hours (140-180 mg/dl) Needs to get orders for Phase 2 DKA order set from physician when patient ready to transition.Patient takes Levemir 15 units BID at home. Will need orders for Levemir 15 units 2 hours before stopping drip and then Novolog SENSITIVE correction scale every 4 hours if NPO and then TID & HS when drip is stopped.  Will continue to monitor blood sugars while in the hospital. Harvel Ricks RN BSN CDE

## 2016-03-23 NOTE — Progress Notes (Signed)
Inpatient Diabetes Program Recommendations  AACE/ADA: New Consensus Statement on Inpatient Glycemic Control (2015)  Target Ranges:  Prepandial:   less than 140 mg/dL      Peak postprandial:   less than 180 mg/dL (1-2 hours)      Critically ill patients:  140 - 180 mg/dL   Lab Results  Component Value Date   GLUCAP 111 (H) 03/23/2016   HGBA1C 10.7 07/05/2015    Review of Glycemic Control  Diabetes history: DM1 Outpatient Diabetes medications: Insulin An[employer] pump Current orders for Inpatient glycemic control: IV insulin drip  Inpatient Diabetes Program Recommendations:  Spoke with patient and husband @ bedside. Patient states she changed her insulin pump site @ 3 am this am and CBGs did not decrease. Patient then changed out her insulin from a new vial and CBGs started to decrease. Patient states her insulin vial was < 61 days old and was kept in the refrigerator. Animas pump @ bedside and rechecked insulin rates: per pump (states sees Dr. Buddy Duty) 12 am   1.4 4 am     1.4 6 am      1.7  Total 35.6 units/24 hrs of basal insulin Carbohydrate ratio 1:7 Insulin sensitivity 1:30  Discussed recommendations with Dyanne Carrel NP. Patient's husband going to bring new set for patient to insert. Plans for patient to be discharged home. Requested patient to keep followup appts with Dr. Buddy Duty, date new insulin vials and only use insulin up to 28 days post vial opening.  Thank you, Nani Gasser. Dione Petron, RN, MSN, CDE Inpatient Glycemic Control Team Team Pager [phone] (8am-5pm) 03/23/2016 2:42 PM

## 2016-03-23 NOTE — Discharge Summary (Signed)
Physician Discharge Summary  [name] [id number] DOB: 16-Oct-1970 DOA: 03/23/2016  PCP: Steve Rattler, DO  Admit date: 03/23/2016 Discharge date: 03/23/2016  Time spent: 65 minutes  Recommendations for Outpatient Follow-up:  1. Follow up with PCP 1-2 weeks for evaluation of insulin pump   Discharge Diagnoses:  Principal Problem:   DKA (diabetic ketoacidoses) ([place]) Active Problems:   Major depressive disorder, single episode   Essential hypertension   AKI (acute kidney injury) ([place])   Discharge Condition: stable  Diet recommendation: carb modified  Filed Weights   03/23/16 0418  Weight: 89.1 kg (196 lb 8 oz)    History of present illness:   [name] is a very pleasant 46 y.o. female with medical history significant for HTN, breast cancer, diabetes type 1 on insulin pump hyperglycemia and nausea. Initial evaluation reveals DKA serum glucose of 539, an ion gap 17 bicarbonate 17.  Information is obtained from the patient. She reports that her blood sugar was gradually creeping up in spite of her wearing her insulin pump. She reports compliance with her meal boluses. She denies any recent pump changes tubing changes changes in suppliers. By way of problem solving she changes the tubing changed the site with no improvement. She reports only when she changed the insulin that her blood sugar start to come down. She came to the emergency department for fear she had not gotten control of her blood sugars in time to avoid DKA. Associated symptoms include nausea without vomiting. She denies headache dizziness syncope or near-syncope. She denies chest pain palpitation shortness of breath lower extremity edema or orthopnea. She denies dominant pain diarrhea constipation dysuria hematuria frequency or urgency. She reports a mild cough nonproductive. States he recently transitioned to [facility] primary care. At the time of admission she reports feeling "much better" with nausea  resolved  Hospital Course:  1. DKA/hyperglycemia in a type I diabetic. Likely related to pump malfunction and/or user error. No signs of infection. Serum glucose 538 an ion gap 17 bicarbonate 17 urine with keytones and glucose. Chart review indicates hemoglobin A1c 8 months ago 10.7. Of note patient has a history of hospitalizations for same with no obvious illness or inciting event. Notes indicate some concern for ongoing noncompliance and/or manipulation of calm. -provided with insulin gtt and vigorous IV fluids. Hourly CBG's trended down quickly. Gap trended down to 11 and then 6.  -after 4 hours of CBG's less than 180 and AG closed x2. Insulin pump resumed with levemir per home regimen -provided with food -insulin gtt discontinued 1 hour after pump resumed -CBG's stable at discharge  2. Acute kidney injury. Likely related to above. Mild. -IV fluids as noted above -resolved at discharge  #3. Hypertension. Blood pressure on the high end of normal. Home medications include lisinopril. -Hold lisinopril initially -plan restart Lisinopril 03/24/16 -BP controlled at discharge  #4. Hyperlipidemia. -Continue home meds  #5. Depression. -Continue home meds -previous hospitalizations note some concern for depression correlated with tendency to non-compliance with Diabetes. -consider [place] OP consult?   Procedures:  none  Consultations:  Diabetes coordinator  Discharge Exam: Vitals:   03/23/16 1015 03/23/16 1100  BP: 112/74 96/60  Pulse: 76 73  Resp: 16 12  Temp:      General: well nourished alert oriented Cardiovascular: RRR no MGR no LE edema Respiratory: normal effort BS clear bilaterally  Discharge Instructions    Current Discharge Medication List    CONTINUE these medications which have NOT CHANGED  Details  atorvastatin (LIPITOR) 10 MG tablet Take 10 mg by mouth daily.    ergocalciferol (VITAMIN D2) 50000 units capsule Take 1 capsule (50,000 Units total) by  mouth once a week. Qty: 12 capsule, Refills: 3   Associated Diagnoses: Malignant neoplasm of left female breast, unspecified site of breast    insulin detemir (LEVEMIR) 100 UNIT/ML injection Inject 0.15 mLs (15 Units total) into the skin 2 (two) times daily. BACKUP FOR PUMP ONLY Qty: 10 mL, Refills: 0    Prenatal Vit-Fe Fumarate-FA (PRENATAL PO) Take 1 tablet by mouth daily.    venlafaxine XR (EFFEXOR-XR) 37.5 MG 24 hr capsule Take 37.5 mg by mouth daily with breakfast.    lisinopril (PRINIVIL,ZESTRIL) 10 MG tablet TAKE ONE TABLET BY MOUTH IN THE MORNING FOR  BLOOD  PRESSURE Qty: 90 tablet, Refills: 3   Associated Diagnoses: Essential hypertension    NOVOLOG 100 UNIT/ML injection USE MAX OF 100 UNITS PER DAY WITH PUMP Qty: 10 mL, Refills: 12       Allergies  Allergen Reactions  . Hydrocodone-Acetaminophen Itching and Swelling  . Insulin Glargine Itching, Nausea And Vomiting and Swelling  . Nph Iletin I [Insulin Isophane, Mixed Nph] Itching, Nausea And Vomiting and Swelling  . Lantus [Insulin Glargine] Nausea And Vomiting and Swelling      The results of significant diagnostics from this hospitalization (including imaging, microbiology, ancillary and laboratory) are listed below for reference.    Significant Diagnostic Studies: Dg Chest Port 1 View  Result Date: 03/23/2016 CLINICAL DATA:  Cough.  History of breast carcinoma EXAM: PORTABLE CHEST 1 VIEW COMPARISON:  March 31, 2015 FINDINGS: There is no edema or consolidation. The heart size and pulmonary vascularity are normal. No adenopathy. There is upper thoracic levoscoliosis. No blastic or lytic bone lesions. Patient is status post left mastectomy with surgical clips in the left axillary region. IMPRESSION: No edema or consolidation. Electronically Signed   By: Lowella Grip III M.D.   On: 03/23/2016 07:40    Microbiology: No results found for this or any previous visit (from the past 240 hour(s)).   Labs: Basic  Metabolic Panel:  Recent Labs Lab 03/23/16 0421 03/23/16 0744 03/23/16 1120  NA 131* 135 139  K 4.3 4.0 4.3  CL 97* 105 111  CO2 17* 19* 22  GLUCOSE 539* 246* 138*  BUN 19 15 12   CREATININE 1.17* 0.94 0.74  CALCIUM 9.8 9.0 9.0   Liver Function Tests: No results for input(s): AST, ALT, ALKPHOS, BILITOT, PROT, ALBUMIN in the last 168 hours. No results for input(s): LIPASE, AMYLASE in the last 168 hours. No results for input(s): AMMONIA in the last 168 hours. CBC:  Recent Labs Lab 03/23/16 0421  WBC 7.7  HGB 13.8  HCT 39.9  MCV 83.0  PLT 270   Cardiac Enzymes: No results for input(s): CKTOTAL, CKMB, CKMBINDEX, TROPONINI in the last 168 hours. BNP: BNP (last 3 results) No results for input(s): BNP in the last 8760 hours.  ProBNP (last 3 results) No results for input(s): PROBNP in the last 8760 hours.  CBG:  Recent Labs Lab 03/23/16 0915 03/23/16 1021 03/23/16 1139 03/23/16 1249 03/23/16 1357  GLUCAP 189* 160* 120* 115* 111*       Signed:  Radene Gunning MD.  [facility] 03/23/2016, 2:00 PM

## 2016-03-23 NOTE — Discharge Instructions (Signed)
You were seen at the hospital due to developing DKA from high glucose and not enough insulin. There is concern that your insulin pump is not functioning. For the next 3 days or so please only use injectable NovoLog for meal coverage and Levemir 15 units every morning. You may then attempt resuming your insulin pump. Please note that if your sugar levels increased after starting the pump then your pump is malfunctioning. Please return to the hospital a few develop any worsening symptoms.

## 2016-03-23 NOTE — Care Management Note (Signed)
Case Management Note  Patient Details  Name: [name] MRN: [id number] Date of Birth: 1970/11/25  Subjective/Objective:                From home with spouse. /45 y.o. female with medical history significant for HTN, breast cancer, diabetes type 1 on insulin pump hyperglycemia and nausea. Initial evaluation reveals DKA serum glucose of 539, an ion gap 17 bicarbonate 17.  Action/Plan: Admit status INPATIENT (DKA/hyperglycemia in a type I diabetic); anticipate discharge [place].   Expected Discharge Date:   (UNSURE)               Expected Discharge Plan:  Home/Self Care  In-House Referral:     Discharge planning Services  CM Consult  Post Acute Care Choice:    Choice offered to:     DME Arranged:    DME Agency:     HH Arranged:    HH Agency:     Status of Service:  In process, will continue to follow  If discussed at Long Length of Stay Meetings, dates discussed:    Additional Comments:  Fuller Mandril, RN 03/23/2016, 9:41 AM

## 2016-03-23 NOTE — ED Triage Notes (Signed)
Pt is type one diabetic, reports that her insulin pump has been giving her troubles since 4PM yesterday, states it kept saying "occluded." States she was unable to fix it until just a little bit ago. Checked her sugar shortly before arrival, states that her meter wasn't able to read it based on being too high. Reports nausea at this time. Uses novolog in pump.

## 2016-03-23 NOTE — ED Notes (Addendum)
Discussed insulin regimen with patient, pt able to comprehend and repeat instructions. Pt will checks B.S. At home. Pt requesting rx for more insulin needles. MD paged.

## 2016-03-23 NOTE — ED Notes (Signed)
Pt removed insulin pump at this time.

## 2016-03-23 NOTE — ED Notes (Signed)
Tray ordered.

## 2016-03-24 LAB — HEMOGLOBIN A1C
Hgb A1c MFr Bld: 11.8 % — ABNORMAL HIGH (ref 4.8–5.6)
MEAN PLASMA GLUCOSE: 292

## 2016-03-28 ENCOUNTER — Encounter: Payer: Commercial Managed Care - PPO | Attending: Internal Medicine | Admitting: *Deleted

## 2016-03-28 DIAGNOSIS — E1010 Type 1 diabetes mellitus with ketoacidosis without coma: Secondary | ICD-10-CM | POA: Diagnosis not present

## 2016-03-28 DIAGNOSIS — Z713 Dietary counseling and surveillance: Secondary | ICD-10-CM | POA: Diagnosis not present

## 2016-03-28 NOTE — Patient Instructions (Signed)
Plan:  We have reviewed DKA causes, treatment and prevention today.    Consider keeping a syringe in your meter case so you can give an injection if potential for ketones   Consider a Rx for [employer] that you can also keep in your meter case so you can rule out ketones quickly wherever you are.  Consider calling the [employer] Help line to trouble shoot your pump and see why you were getting so many occlusion alarms last week and over this weekend.   Discuss with Dr. Buddy Duty the possibility of staying on Levemir for now and using pump to bolus for meals and corrections until pump accuracy is confirmed. Levemir will give you coverage and should keep you out of DKA and this will cut back on number of injections per day.   Also ask Dr. Buddy Duty how to titrate the Levemir doses until your FBG come down to target ranges.

## 2016-03-30 NOTE — Progress Notes (Signed)
Diabetes Self-Management Education  Visit Type:    Appt. Start Time: 1400 Appt. End Time: 1500  03/30/2016  [name], identified by name and date of birth, is a 46 y.o. female with a diagnosis of Diabetes:  . She was in DKA last Thursday after having several Occlusion Alarms on her Animas pump. She states she changed out her tubing, then changed out her site with no affect on controlling her BG. She states she was told to come off of her pump when leaving the ED and to take injections for the next few days. She states Dr. Buddy Duty instructed her to take 15 units Levemir twice a day and Novolog injections based on pump ICR and ISF. She states she has done that along with leaving her pump on over the weekend and BG started improving. She took her pump off last night, took Levemir per above and BG this AM was over 400 mg/dl. Note: her TDD of basal insulin per pump settings was 54 units/day. She is unsure if she wants to stay on pump for now or go to injections.   ASSESSMENT  Height 5\' 5"  (1.651 m), weight 200 lb 4.8 oz (90.9 kg). Body mass index is 33.33 kg/m.  Individualized Plan for Diabetes Self-Management Training:   Learning Objective:  Patient will have a greater understanding of diabetes self-management. Patient education plan is to attend individual and/or group sessions per assessed needs and concerns.  We called Roxanne Ripple with [employer] to check on possiblility of moving to [employer] 630G insulin pump with [employer] no longer in business. Patient is still within her Warranty period but not sure if current pump is working properly or if it can even be replaced. Roxanne explained importance of calling the Help LIne to trouble shoot the pump and to document the problem. Patient plans to call after this appointment.   I reviewed DKA, causes, treatment, and prevention  I reviewed insulin action of Levemir and that with the 24 hour coverage, she is at low risk for ketones even if pump  doesn't deliver full bolus amount. We also discussed that she needs to increase her Levemir dose as Dr. Buddy Duty was not aware that she was getting her basal rate along with the Levemir he was prescribing and that without that basal rate, her BG's are much too high. I encouraged her to be in touch with him regarding her BG's and true insulin dosing.    Plan:   Patient Instructions  Plan:  We have reviewed DKA causes, treatment and prevention today.   Consider keeping a syringe in your meter case so you can give an injection if potential for ketones  Consider a Rx for [employer] that you can also keep in your meter case so you can rule out ketones quickly wherever you are.  Consider calling the [employer] Help line to trouble shoot your pump and see why you were getting so many occlusion alarms last week and over this weekend.   Discuss with Dr. Buddy Duty the possibility of staying on Levemir for now and using pump to bolus for meals and corrections until pump accuracy is confirmed. Levemir will give you coverage and should keep you out of DKA and this will cut back on number of injections per day.   Also ask Dr. Buddy Duty how to titrate the Levemir doses until your FBG come down to target ranges.  Expected Outcomes:     Education material provided: Insulin Action handout, DKA handout  If problems or questions, patient to contact team via:  Phone and Email  Future DSME appointment:  1 month

## 2016-04-21 DIAGNOSIS — E065 Other chronic thyroiditis: Secondary | ICD-10-CM | POA: Diagnosis not present

## 2016-04-21 DIAGNOSIS — E1065 Type 1 diabetes mellitus with hyperglycemia: Secondary | ICD-10-CM | POA: Diagnosis not present

## 2016-04-21 DIAGNOSIS — Z794 Long term (current) use of insulin: Secondary | ICD-10-CM | POA: Diagnosis not present

## 2016-05-03 ENCOUNTER — Encounter: Payer: Commercial Managed Care - PPO | Attending: Internal Medicine | Admitting: *Deleted

## 2016-05-03 DIAGNOSIS — IMO0002 Reserved for concepts with insufficient information to code with codable children: Secondary | ICD-10-CM

## 2016-05-03 DIAGNOSIS — Z713 Dietary counseling and surveillance: Secondary | ICD-10-CM | POA: Insufficient documentation

## 2016-05-03 DIAGNOSIS — E119 Type 2 diabetes mellitus without complications: Secondary | ICD-10-CM | POA: Diagnosis not present

## 2016-05-03 DIAGNOSIS — E1065 Type 1 diabetes mellitus with hyperglycemia: Secondary | ICD-10-CM

## 2016-05-03 DIAGNOSIS — E108 Type 1 diabetes mellitus with unspecified complications: Secondary | ICD-10-CM

## 2016-05-03 NOTE — Progress Notes (Signed)
Diabetes Self-Management Education  Visit Type:    Appt. Start Time: 1400 Appt. End Time: 1430  05/03/2016  Ms. Shavonna Ang, identified by name and date of birth, is a 46 y.o. female with a diagnosis of Diabetes: She attended the Type 12 Support Group last month and learned more about the [place] [employer] CGM and was instructed how to explain to the pharmacist the steps to bring up her insurance coverage for this new product. She was able to initiate use of Libre that night and had communicated with me a couple of days later how much she liked it.  Today, she was very emotional about how much it has improved her quality of life. She described as convenient, accurate, easy to use and no more finger sticks. She is checking her BG more often and giving correction doses more often too. She reports she has gone back on her Animas pump now.  She discussed the 12 hour warm up time and asked if she could use the meter while in the warm up mode. I looked up the information on the website and informed her that she can.  ASSESSMENT  There were no vitals taken for this visit. There is no height or weight on file to calculate BMI.  Individualized Plan for Diabetes Self-Management Training:   Learning Objective:  Patient will have a greater understanding of diabetes self-management. Patient education plan is to attend individual and/or group sessions per assessed needs and concerns.  Patient Instructions  Plan:   Continue using the [facility] for your BG information  Continue correcting high BG as often as possible.  Continue getting ongoing education with Type 1 Support Group as needed.   Expected Outcomes:     Education material provided: Insulin Action handout, DKA handout  If problems or questions, patient to contact team via:  Phone and Email  Future DSME appointment:   PRN

## 2016-05-03 NOTE — Patient Instructions (Signed)
Plan:  Continue using the [facility] for your BG information  Continue correcting high BG as often as possible.  Continue getting ongoing education with Type 1 Support Group as needed.

## 2016-06-02 DIAGNOSIS — C50919 Malignant neoplasm of unspecified site of unspecified female breast: Secondary | ICD-10-CM | POA: Diagnosis not present

## 2016-06-02 DIAGNOSIS — E1065 Type 1 diabetes mellitus with hyperglycemia: Secondary | ICD-10-CM | POA: Diagnosis not present

## 2016-08-15 DIAGNOSIS — F322 Major depressive disorder, single episode, severe without psychotic features: Secondary | ICD-10-CM | POA: Diagnosis not present

## 2016-08-15 DIAGNOSIS — I10 Essential (primary) hypertension: Secondary | ICD-10-CM | POA: Diagnosis not present

## 2016-08-15 DIAGNOSIS — Z794 Long term (current) use of insulin: Secondary | ICD-10-CM | POA: Diagnosis not present

## 2016-08-15 DIAGNOSIS — E1042 Type 1 diabetes mellitus with diabetic polyneuropathy: Secondary | ICD-10-CM | POA: Diagnosis not present

## 2016-08-15 DIAGNOSIS — E7800 Pure hypercholesterolemia, unspecified: Secondary | ICD-10-CM | POA: Diagnosis not present

## 2016-08-16 DIAGNOSIS — E065 Other chronic thyroiditis: Secondary | ICD-10-CM | POA: Diagnosis not present

## 2016-08-16 DIAGNOSIS — E1065 Type 1 diabetes mellitus with hyperglycemia: Secondary | ICD-10-CM | POA: Diagnosis not present

## 2016-08-16 DIAGNOSIS — Z794 Long term (current) use of insulin: Secondary | ICD-10-CM | POA: Diagnosis not present

## 2016-08-17 ENCOUNTER — Other Ambulatory Visit: Payer: Self-pay | Admitting: *Deleted

## 2016-08-22 DIAGNOSIS — I959 Hypotension, unspecified: Secondary | ICD-10-CM | POA: Diagnosis not present

## 2016-09-12 DIAGNOSIS — F322 Major depressive disorder, single episode, severe without psychotic features: Secondary | ICD-10-CM | POA: Diagnosis not present

## 2016-09-12 DIAGNOSIS — I10 Essential (primary) hypertension: Secondary | ICD-10-CM | POA: Diagnosis not present

## 2016-09-12 DIAGNOSIS — E7800 Pure hypercholesterolemia, unspecified: Secondary | ICD-10-CM | POA: Diagnosis not present

## 2016-09-21 ENCOUNTER — Observation Stay (HOSPITAL_COMMUNITY)
Admission: EM | Admit: 2016-09-21 | Discharge: 2016-09-23 | Disposition: A | Discharge status: Home / Self Care | Payer: Commercial Managed Care - PPO | Attending: Internal Medicine | Admitting: Internal Medicine

## 2016-09-21 ENCOUNTER — Emergency Department (HOSPITAL_COMMUNITY): Payer: Commercial Managed Care - PPO

## 2016-09-21 ENCOUNTER — Encounter (HOSPITAL_COMMUNITY): Payer: Self-pay | Admitting: Emergency Medicine

## 2016-09-21 DIAGNOSIS — E860 Dehydration: Secondary | ICD-10-CM | POA: Diagnosis not present

## 2016-09-21 DIAGNOSIS — Z853 Personal history of malignant neoplasm of breast: Secondary | ICD-10-CM | POA: Diagnosis not present

## 2016-09-21 DIAGNOSIS — I10 Essential (primary) hypertension: Secondary | ICD-10-CM | POA: Diagnosis present

## 2016-09-21 DIAGNOSIS — Z794 Long term (current) use of insulin: Secondary | ICD-10-CM | POA: Insufficient documentation

## 2016-09-21 DIAGNOSIS — E1110 Type 2 diabetes mellitus with ketoacidosis without coma: Secondary | ICD-10-CM | POA: Diagnosis present

## 2016-09-21 DIAGNOSIS — E1165 Type 2 diabetes mellitus with hyperglycemia: Secondary | ICD-10-CM | POA: Diagnosis not present

## 2016-09-21 DIAGNOSIS — Z79899 Other long term (current) drug therapy: Secondary | ICD-10-CM | POA: Diagnosis not present

## 2016-09-21 DIAGNOSIS — Z885 Allergy status to narcotic agent status: Secondary | ICD-10-CM | POA: Insufficient documentation

## 2016-09-21 DIAGNOSIS — N179 Acute kidney failure, unspecified: Secondary | ICD-10-CM | POA: Diagnosis present

## 2016-09-21 DIAGNOSIS — Z9641 Presence of insulin pump (external) (internal): Secondary | ICD-10-CM | POA: Diagnosis not present

## 2016-09-21 DIAGNOSIS — E1010 Type 1 diabetes mellitus with ketoacidosis without coma: Principal | ICD-10-CM | POA: Insufficient documentation

## 2016-09-21 DIAGNOSIS — R739 Hyperglycemia, unspecified: Secondary | ICD-10-CM | POA: Diagnosis present

## 2016-09-21 DIAGNOSIS — R0602 Shortness of breath: Secondary | ICD-10-CM | POA: Diagnosis not present

## 2016-09-21 DIAGNOSIS — R7309 Other abnormal glucose: Secondary | ICD-10-CM | POA: Diagnosis not present

## 2016-09-21 LAB — GLUCOSE, CAPILLARY
GLUCOSE-CAPILLARY: 103 mg/dL — AB (ref 65–99)
GLUCOSE-CAPILLARY: 110 mg/dL — AB (ref 65–99)
GLUCOSE-CAPILLARY: 146 mg/dL — AB (ref 65–99)
GLUCOSE-CAPILLARY: 164 mg/dL — AB (ref 65–99)
GLUCOSE-CAPILLARY: 175 mg/dL — AB (ref 65–99)
GLUCOSE-CAPILLARY: 193 mg/dL — AB (ref 65–99)
GLUCOSE-CAPILLARY: 212 mg/dL — AB (ref 65–99)
GLUCOSE-CAPILLARY: 275 mg/dL — AB (ref 65–99)
GLUCOSE-CAPILLARY: 340 mg/dL — AB (ref 65–99)
Glucose-Capillary: 213 mg/dL — ABNORMAL HIGH (ref 65–99)
Glucose-Capillary: 122 mg/dL — ABNORMAL HIGH (ref 65–99)
Glucose-Capillary: 125 mg/dL — ABNORMAL HIGH (ref 65–99)
Glucose-Capillary: 133 mg/dL — ABNORMAL HIGH (ref 65–99)

## 2016-09-21 LAB — CBC
HCT: 36.6 % (ref 36.0–46.0)
Hemoglobin: 13.4 g/dL (ref 12.0–15.0)
MCH: 29.1 pg (ref 26.0–34.0)
MCHC: 36.6 g/dL — AB (ref 30.0–36.0)
MCV: 79.4 fL (ref 78.0–100.0)
PLATELETS: 266 10*3/uL (ref 150–400)
RBC: 4.61 MIL/uL (ref 3.87–5.11)
RDW: 12.4 % (ref 11.5–15.5)
WBC: 9 10*3/uL (ref 4.0–10.5)

## 2016-09-21 LAB — BLOOD GAS, VENOUS
Acid-base deficit: 10.4 mmol/L — ABNORMAL HIGH (ref 0.0–2.0)
Bicarbonate: 16.9 mmol/L — ABNORMAL LOW (ref 20.0–28.0)
O2 Content: 47.2 L/min
O2 Saturation: 47.2 %
PCO2 VEN: 44.1 mmHg (ref 44.0–60.0)
PH VEN: 7.209 — AB (ref 7.250–7.430)
Patient temperature: 37

## 2016-09-21 LAB — BASIC METABOLIC PANEL
ANION GAP: 17 — AB (ref 5–15)
Anion gap: 11 (ref 5–15)
Anion gap: 9 (ref 5–15)
BUN: 27 mg/dL — ABNORMAL HIGH (ref 6–20)
BUN: 24 mg/dL — ABNORMAL HIGH (ref 6–20)
BUN: 16 mg/dL (ref 6–20)
CALCIUM: 8.8 mg/dL — AB (ref 8.9–10.3)
CHLORIDE: 100 mmol/L — AB (ref 101–111)
CO2: 15 mmol/L — ABNORMAL LOW (ref 22–32)
CO2: 18 mmol/L — AB (ref 22–32)
CO2: 19 mmol/L — ABNORMAL LOW (ref 22–32)
CREATININE: 1.27 mg/dL — AB (ref 0.44–1.00)
CREATININE: 0.99 mg/dL (ref 0.44–1.00)
Calcium: 9.4 mg/dL (ref 8.9–10.3)
Calcium: 8.6 mg/dL — ABNORMAL LOW (ref 8.9–10.3)
Chloride: 108 mmol/L (ref 101–111)
Chloride: 106 mmol/L (ref 101–111)
Creatinine, Ser: 0.74 mg/dL (ref 0.44–1.00)
GFR calc Af Amer: >60 mL/min (ref >60)
GFR calc non Af Amer: 49 mL/min — ABNORMAL LOW (ref >60)
GFR calc non Af Amer: >60 mL/min (ref >60)
GFR, EST AFRICAN AMERICAN: 57 mL/min — AB (ref >60)
GLUCOSE: 211 mg/dL — AB (ref 65–99)
Glucose, Bld: 457 mg/dL — ABNORMAL HIGH (ref 65–99)
Glucose, Bld: 187 mg/dL — ABNORMAL HIGH (ref 65–99)
Potassium: 5.2 mmol/L — ABNORMAL HIGH (ref 3.5–5.1)
Potassium: 4.4 mmol/L (ref 3.5–5.1)
Potassium: 3.9 mmol/L (ref 3.5–5.1)
SODIUM: 132 mmol/L — AB (ref 135–145)
Sodium: 137 mmol/L (ref 135–145)
Sodium: 134 mmol/L — ABNORMAL LOW (ref 135–145)

## 2016-09-21 LAB — URINALYSIS, ROUTINE W REFLEX MICROSCOPIC
Bacteria, UA: NONE SEEN
Bilirubin Urine: NEGATIVE
Hgb urine dipstick: NEGATIVE
Ketones, ur: 80 mg/dL — AB
LEUKOCYTES UA: NEGATIVE
Nitrite: NEGATIVE
PH: 5 (ref 5.0–8.0)
Protein, ur: NEGATIVE mg/dL
SPECIFIC GRAVITY, URINE: 1.021 (ref 1.005–1.030)

## 2016-09-21 LAB — CBG MONITORING, ED
Glucose-Capillary: 481 mg/dL — ABNORMAL HIGH (ref 65–99)
Glucose-Capillary: 397 mg/dL — ABNORMAL HIGH (ref 65–99)

## 2016-09-21 LAB — MRSA PCR SCREENING: MRSA by PCR: NEGATIVE

## 2016-09-21 MED ORDER — SODIUM CHLORIDE 0.9 % IV SOLN: INTRAVENOUS | Status: AC

## 2016-09-21 MED ORDER — PRENATAL MULTIVITAMIN CH
1.0000 | ORAL_TABLET | Freq: Every day | ORAL | Status: DC
  Administered 2016-09-21 – 2016-09-22 (×2): 1 via ORAL
  Filled 2016-09-21 (×3): qty 1

## 2016-09-21 MED ORDER — SODIUM CHLORIDE 0.9 % IV BOLUS (SEPSIS)
1000.0000 mL | Freq: Once | INTRAVENOUS | Status: AC
  Administered 2016-09-21: 1000 mL via INTRAVENOUS

## 2016-09-21 MED ORDER — SODIUM CHLORIDE 0.9 % IV SOLN
INTRAVENOUS | Status: DC
  Administered 2016-09-21: 4.2 [IU]/h via INTRAVENOUS
  Filled 2016-09-21: qty 1

## 2016-09-21 MED ORDER — ATORVASTATIN CALCIUM 10 MG PO TABS
10.0000 mg | ORAL_TABLET | Freq: Every day | ORAL | Status: DC
  Administered 2016-09-21 – 2016-09-23 (×3): 10 mg via ORAL
  Filled 2016-09-21 (×3): qty 1

## 2016-09-21 MED ORDER — ENOXAPARIN SODIUM 40 MG/0.4ML ~~LOC~~ SOLN
40.0000 mg | SUBCUTANEOUS | Status: DC
  Administered 2016-09-21 – 2016-09-22 (×2): 40 mg via SUBCUTANEOUS
  Filled 2016-09-21 (×2): qty 0.4

## 2016-09-21 MED ORDER — LACTATED RINGERS IV SOLN
INTRAVENOUS | Status: DC
  Administered 2016-09-21: 09:00:00 via INTRAVENOUS

## 2016-09-21 MED ORDER — SODIUM CHLORIDE 0.9 % IV SOLN
INTRAVENOUS | Status: DC
  Filled 2016-09-21: qty 1

## 2016-09-21 MED ORDER — PRENATAL 27-0.8 MG PO TABS
1.0000 | ORAL_TABLET | Freq: Every day | ORAL | Status: DC
  Filled 2016-09-21: qty 1

## 2016-09-21 MED ORDER — DEXTROSE-NACL 5-0.45 % IV SOLN: INTRAVENOUS | Status: DC

## 2016-09-21 MED ORDER — LISINOPRIL 10 MG PO TABS
10.0000 mg | ORAL_TABLET | Freq: Every day | ORAL | Status: DC
  Administered 2016-09-22 – 2016-09-23 (×2): 10 mg via ORAL
  Filled 2016-09-21 (×2): qty 1

## 2016-09-21 MED ORDER — VENLAFAXINE HCL ER 37.5 MG PO CP24
37.5000 mg | ORAL_CAPSULE | Freq: Every day | ORAL | Status: DC
  Administered 2016-09-22 – 2016-09-23 (×2): 37.5 mg via ORAL
  Filled 2016-09-21 (×2): qty 1

## 2016-09-21 MED ORDER — ONDANSETRON HCL 4 MG/2ML IJ SOLN: 4.0000 mg | Freq: Four times a day (QID) | INTRAMUSCULAR | Status: DC | PRN

## 2016-09-21 MED ORDER — POTASSIUM CHLORIDE CRYS ER 20 MEQ PO TBCR
20.0000 meq | EXTENDED_RELEASE_TABLET | Freq: Once | ORAL | Status: AC
  Administered 2016-09-21: 20 meq via ORAL
  Filled 2016-09-21: qty 1

## 2016-09-21 MED ORDER — DEXTROSE-NACL 5-0.45 % IV SOLN
INTRAVENOUS | Status: DC
  Administered 2016-09-21 – 2016-09-22 (×3): via INTRAVENOUS

## 2016-09-21 MED ORDER — SODIUM CHLORIDE 0.9 % IV SOLN
INTRAVENOUS | Status: DC
  Administered 2016-09-21: 11:00:00 via INTRAVENOUS

## 2016-09-21 MED ORDER — BUPROPION HCL ER (XL) 150 MG PO TB24
150.0000 mg | ORAL_TABLET | Freq: Every day | ORAL | Status: DC
  Administered 2016-09-21 – 2016-09-23 (×3): 150 mg via ORAL
  Filled 2016-09-21 (×3): qty 1

## 2016-09-21 NOTE — ED Notes (Signed)
Bed: WA15 Expected date:  Expected time:  Means of arrival:  Comments: EMS  

## 2016-09-21 NOTE — H&P (Signed)
History and Physical:    [name]   OFB:[id number] DOB: 1971/01/30 DOA: 09/21/2016  Referring MD/provider: Dr. Ellender Hose PCP: Gaynelle Arabian, MD   Patient coming from: Home  Chief Complaint: "I could tell it was in DKA".  History of Present Illness:   [name] is an 46 y.o. female with type 1 diabetes mellitus on insulin pump with frequent admissions for DKA most recently 6 months ago who is admitted for DKA.  Patient states she was in her usual state of moderately good health until yesterday when she changed her insulin vial out in her insulin pump. Patient states that it was a new vial that had been in the refrigerator. After she changed out the vial, she noted that she was feeling "unwell", patient is unable to say exactly what that is but she knows what it feels like. After dinner last night patient noted that she had polyuria and increased thirst and relies that her sugar was high. She said she was bolusing herself with her pump. Her sugar at 440 this morning was 344 and she said she was feeling better. However at 6:30 this morning patient started having nausea and vomiting and new that she was in DKA so presented to ED. Patient states she has not eaten anything since supper last night.  ED Course:  The patient was treated with aggressive fluid resuscitation and DKA protocol with glucose stabilizer.  ROS:   ROS patient denies fevers or chills. Denies cough or shortness of breath. Admits to slight headache which she states is common with her DKA. Denies abdominal pain or dysuria or back pain.  Past Medical History:   Past Medical History:  Diagnosis Date  . Allergy   . Blood clot in vein    nonocclusive clot right subclavian vein, on Xarelto for 6 months  . Breast cancer ([place])    T2N2 tiple negative left breast ca   . Breast cancer ([place])   . Breast mass in female    fibrocystic changes  . Cataracts, bilateral   . Diabetes mellitus    insulin dependant - uses  pump, Dr. Dwyane Dee  . History of chemotherapy    taxotere/cytoxan with neulasta support  . Hyperlipidemia   . Hypertension   . Lymphedema of arm    left upper extremity  . Nausea   . Nausea & vomiting    for approx. 2 weeks  . Radiation 08/21/11-10/05/11   Left chestwll/Supraclav./left PAB/left scar  . S/P chemotherapy, time since 4-12 weeks     Past Surgical History:   Past Surgical History:  Procedure Laterality Date  . ABDOMINAL HYSTERECTOMY  2002   still has ovaries   . bladder tacking  01/2009  . left mastectomy  02/21/2011  . MASS EXCISION  02/19/2012   Procedure: MINOR EXCISION OF MASS;  Surgeon: Adin Hector, MD;  Location: [place];  Service: General;  Laterality: Left;  Marland Kitchen MASTECTOMY MODIFIED RADICAL  02/21/11   left,invasive  multifocal grade III ductal ca,high grade dcis,lymph/vascular invasion invasion (7/14) nodes positive mets  . PORT-A-CATH REMOVAL  10/13/2011   Procedure: MINOR REMOVAL PORT-A-CATH;  Surgeon: Adin Hector, MD;  Location: [place];  Service: General;  Laterality: Right;  PORT-A CATH REMOVAL  . PORTACATH PLACEMENT  02/21/2011   Procedure: INSERTION PORT-A-CATH;  Surgeon: Adin Hector, MD;  Location: [place];  Service: General;  Laterality: N/A;  . SHOULDER SURGERY  04/26/10   left  .  TUBAL LIGATION  12/1999    Social History:   Social History   Social History  . Marital status: Married    Spouse name: N/A  . Number of children: N/A  . Years of education: N/A   Occupational History  . Not on file.   Social History Main Topics  . Smoking status: Never Smoker  . Smokeless tobacco: Never Used  . Alcohol use No  . Drug use: No  . Sexual activity: Yes     Comment: menses age 15, 23st preganancy age 37, G14p3   Other Topics Concern  . Not on file   Social History Narrative  . No narrative on file    Allergies   Hydrocodone-acetaminophen; Insulin glargine; Nph iletin i [insulin isophane, mixed nph];  and Lantus [insulin glargine]  Family history:   Family History  Problem Relation Age of Onset  . Cancer Maternal Aunt        breast  . Cancer Maternal Uncle 50        right eye removed s/p cancer  . Hypertension Unknown   . Diabetes Unknown   . Deep vein thrombosis Unknown   . Kidney disease Unknown   . Cancer Maternal Grandfather        prostate ca    Current Medications:   Prior to Admission medications   Medication Sig Start Date End Date Taking? Authorizing Provider  atorvastatin (LIPITOR) 10 MG tablet Take 10 mg by mouth daily. 06/01/15  Yes [provider]  buPROPion (WELLBUTRIN XL) 150 MG 24 hr tablet Take 150 mg by mouth daily.  09/12/16  Yes [provider]  insulin detemir (LEVEMIR) 100 UNIT/ML injection Inject 0.15 mLs (15 Units total) into the skin at bedtime. Patient taking differently: Inject 15 Units into the skin at bedtime as needed.  03/23/16  Yes Black, Lezlie Octave, NP  lisinopril (PRINIVIL,ZESTRIL) 10 MG tablet TAKE ONE TABLET BY MOUTH IN THE MORNING FOR  BLOOD  PRESSURE 04/17/14  Yes Leone Brand, MD  NOVOLOG 100 UNIT/ML injection USE MAX OF 100 UNITS PER DAY WITH PUMP 01/27/16  Yes Riccio, Angela C, DO  Prenatal Vit-Fe Fumarate-FA (PRENATAL PO) Take 1 tablet by mouth daily.   Yes [provider]  venlafaxine XR (EFFEXOR-XR) 37.5 MG 24 hr capsule Take 37.5 mg by mouth daily with breakfast.   Yes [provider]  ergocalciferol (VITAMIN D2) 50000 units capsule Take 1 capsule (50,000 Units total) by mouth once a week. Patient taking differently: Take 50,000 Units by mouth every Sunday.  03/08/15   Nicholas Lose, MD  insulin detemir (LEVEMIR) 100 UNIT/ML injection Inject 0.15 mLs (15 Units total) into the skin 2 (two) times daily. BACKUP FOR PUMP ONLY Patient not taking: Reported on 09/21/2016 03/23/16   Waldemar Dickens, MD    Physical Exam:   Vitals:   09/21/16 0735 09/21/16 0737 09/21/16 0832  BP: 119/73  120/72  Pulse: 96  89    Resp:   17  Temp:  97.9 F (36.6 C)   TempSrc:  Oral   SpO2: 95%  100%     Physical Exam: Blood pressure 120/72, pulse 89, temperature 97.9 F (36.6 C), temperature source Oral, resp. rate 17, SpO2 100 %. Gen: Tired and acutely ill-appearing female lying in bed with husband at bedside. Head: Normocephalic, atraumatic. Eyes:  Sclerae nonicteric. Conjunctiva not injected Mouth: Oropharynx reveals no lesions. Neck: Supple. Chest: Lungs are clear to auscultation with good air movement. No rales, rhonchi or wheezes.  CV: Heart sounds are regular with a 2/6 systolic murmur left sternal border without radiation to apex or axilla or carotids. Abdomen: Soft, nontender, nondistended with normal active bowel sounds.  Extremities: Extremities are without clubbing, or cyanosis. No edema.  Skin: Warm and dry. No rashes, lesions or wounds. Neuro: Alert and oriented times 3; grossly nonfocal.  Psych: Mood and affect normal.   Data Review:    Labs: Basic Metabolic Panel:  Recent Labs Lab 09/21/16 0743  NA 132*  K 5.2*  CL 100*  CO2 15*  GLUCOSE 457*  BUN 27*  CREATININE 1.27*  CALCIUM 9.4   Liver Function Tests: No results for input(s): AST, ALT, ALKPHOS, BILITOT, PROT, ALBUMIN in the last 168 hours. No results for input(s): LIPASE, AMYLASE in the last 168 hours. No results for input(s): AMMONIA in the last 168 hours. CBC:  Recent Labs Lab 09/21/16 0743  WBC 9.0  HGB 13.4  HCT 36.6  MCV 79.4  PLT 266   Cardiac Enzymes: No results for input(s): CKTOTAL, CKMB, CKMBINDEX, TROPONINI in the last 168 hours.  BNP (last 3 results) No results for input(s): PROBNP in the last 8760 hours. CBG:  Recent Labs Lab 09/21/16 0831  GLUCAP 481*    Urinalysis    Component Value Date/Time   COLORURINE STRAW (A) 03/23/2016 0449   APPEARANCEUR CLEAR 03/23/2016 0449   LABSPEC 1.023 03/23/2016 0449   PHURINE 5.0 03/23/2016 0449   GLUCOSEU >=500 (A) 03/23/2016 0449   GLUCOSEU  500 (A) 02/04/2013 0854   HGBUR NEGATIVE 03/23/2016 0449   HGBUR negative 07/27/2008 0916   BILIRUBINUR NEGATIVE 03/23/2016 0449   KETONESUR 80 (A) 03/23/2016 0449   PROTEINUR NEGATIVE 03/23/2016 0449   UROBILINOGEN 1.0 11/30/2014 0457   NITRITE NEGATIVE 03/23/2016 0449   LEUKOCYTESUR NEGATIVE 03/23/2016 0449      Radiographic Studies: Dg Chest 2 View  Result Date: 09/21/2016 CLINICAL DATA:  Shortness of breath.  Diabetic ketoacidosis EXAM: CHEST  2 VIEW COMPARISON:  March 23, 2016 FINDINGS: Lungs are clear. Heart size and pulmonary vascularity are normal. No adenopathy. There is upper thoracic levoscoliosis. Patient is status post left mastectomy with surgical clips over the left axillary region. No blastic or lytic bone lesions are evident. IMPRESSION: Lungs clear. No adenopathy. Postoperative changes from mastectomy on the left. Electronically Signed   By: Lowella Grip III M.D.   On: 09/21/2016 08:22    EKG: Independently reviewed. Normal sinus rhythm, normal intervals, normal axis. Probable p pulmonale. Tall R-wave in V2. No acute ST-T wave changes.   Assessment/Plan:   Principal Problem:   DKA (diabetic ketoacidoses) (HCC) Active Problems:   Essential hypertension   AKI (acute kidney injury) ([place])  DKA Will admit to stepdown unit with DKA protocol/glucose stabilizer. Continue aggressive fluid resuscitation. No evidence for infection or other clinical reason for DKA. Patient has had several previous admissions for DKA without precipitating factor. AKI Continue fluid resuscitation, hold lisinopril until hypovolemia is adequately treated.  HTN Will hold her lisinopril for today given acute renal failure likely secondary to decreased intravascular volume. Lisinopril ordered to restart tomorrow morning.  DEPRESSION Continue venlafaxine.   Other information:   DVT prophylaxis: Lovenox ordered. Code Status: Full code. Family Communication: Husband was at  bedside throughout encounter Disposition Plan: Home Consults called: None Admission status: Observation   The medical decision making on this patient was of high complexity and the patient is at high risk for clinical deterioration, therefore this is a level 3 visit.  Dewaine Oats Derek Jack [facility] Pager [phone] Cell: [phone]   If 7PM-7AM, please contact night-coverage [email or web address] Password [facility] 09/21/2016, 8:57 AM

## 2016-09-21 NOTE — ED Triage Notes (Signed)
Per EMS-CBG 354-was seen by Endo last week and A1C 11.0-patient has insulin pump and has been dosing self all night-changes where not made to meds last week when seen-N/V x1

## 2016-09-21 NOTE — ED Provider Notes (Signed)
[facility] DEPT Provider Note   CSN: [id number] Arrival date & time: 09/21/16  5366     History   Chief Complaint Chief Complaint  Patient presents with  . Hyperglycemia    HPI [name] is a 46 y.o. female.  HPI   46 year old female with past medical history as below including type 1 diabetes, hypertension, hyperlipidemia, who presents with nausea, vomiting, and hyperglycemia. Patient states that yesterday afternoon, she began to feel mildly nauseous and weak. She then began vomiting several times throughout the night. She had some transient shortness of breath as well but denies any current shortness of breath. She has had a history of diabetic ketoacidosis with similar symptoms in the past. She denies any known triggers. Her insulin pump has been functioning normally. She denies any pain or induration at her pump needle site. She changed her insulin yesterday with a fresh bile that was not exposed to the heat. She denies any recent illnesses. No fevers or chills. No urinary symptoms beyond polyuria.  Past Medical History:  Diagnosis Date  . Allergy   . Blood clot in vein    nonocclusive clot right subclavian vein, on Xarelto for 6 months  . Breast cancer ([place])    T2N2 tiple negative left breast ca   . Breast cancer ([place])   . Breast mass in female    fibrocystic changes  . Cataracts, bilateral   . Diabetes mellitus    insulin dependant - uses pump, Dr. Dwyane Dee  . History of chemotherapy    taxotere/cytoxan with neulasta support  . Hyperlipidemia   . Hypertension   . Lymphedema of arm    left upper extremity  . Nausea   . Nausea & vomiting    for approx. 2 weeks  . Radiation 08/21/11-10/05/11   Left chestwll/Supraclav./left PAB/left scar  . S/P chemotherapy, time since 4-12 weeks     Patient Active Problem List   Diagnosis Date Noted  . Type 1 diabetes mellitus with ketoacidosis without coma ([place])   . DKA (diabetic ketoacidoses) ([place]) 06/20/2015  . AKI  (acute kidney injury) ([place])   . Vitamin D deficiency disease 11/23/2014  . Change in hearing of right ear 11/09/2014  . Acute upper respiratory infections of unspecified site 04/25/2013  . Essential hypertension 12/04/2012  . Cough 09/01/2012  . Other and unspecified ovarian cyst 08/12/2012  . Pap smear for cervical cancer screening 08/12/2012  . Diabetic retinopathy ([place]) 07/12/2012  . Jaw pain 06/26/2012  . Mass of left side of neck 02/06/2012  . Breast carcinoma metastatic, skin 05/08/2011  . Cancer of left breast ([place]) 01/19/2011  . Patellofemoral syndrome, Right 03/10/2010  . THORACIC SOMATIC DYSFUNCTION 02/15/2010  . SOMATIC DYSFUNCTION, RIB CAGE 02/15/2010  . ROTATOR CUFF INJURY, LEFT SHOULDER 11/09/2009  . HOT FLASHES 06/14/2007  . Type I diabetes mellitus with complication, uncontrolled ([place]) 11/16/2006  . HYPERLIPIDEMIA 11/16/2006  . Major depressive disorder, single episode 11/16/2006    Past Surgical History:  Procedure Laterality Date  . ABDOMINAL HYSTERECTOMY  2002   still has ovaries   . bladder tacking  01/2009  . left mastectomy  02/21/2011  . MASS EXCISION  02/19/2012   Procedure: MINOR EXCISION OF MASS;  Surgeon: Adin Hector, MD;  Location: ;  Service: General;  Laterality: Left;  Marland Kitchen MASTECTOMY MODIFIED RADICAL  02/21/11   left,invasive  multifocal grade III ductal ca,high grade dcis,lymph/vascular invasion invasion (7/14) nodes positive mets  . PORT-A-CATH REMOVAL  10/13/2011  Procedure: MINOR REMOVAL PORT-A-CATH;  Surgeon: Adin Hector, MD;  Location: [facility];  Service: General;  Laterality: Right;  PORT-A CATH REMOVAL  . PORTACATH PLACEMENT  02/21/2011   Procedure: INSERTION PORT-A-CATH;  Surgeon: Adin Hector, MD;  Location: [facility];  Service: General;  Laterality: N/A;  . SHOULDER SURGERY  04/26/10   left  . TUBAL LIGATION  12/1999    OB History    Obstetric Comments   G4P3, menarche age 63, and  menopause at time of hysterectomy; no hx of hormone replacement therapy       Home Medications    Prior to Admission medications   Medication Sig Start Date End Date Taking? Authorizing Provider  atorvastatin (LIPITOR) 10 MG tablet Take 10 mg by mouth daily. 06/01/15  Yes [provider]  buPROPion (WELLBUTRIN XL) 150 MG 24 hr tablet Take 150 mg by mouth daily.  09/12/16  Yes [provider]  insulin detemir (LEVEMIR) 100 UNIT/ML injection Inject 0.15 mLs (15 Units total) into the skin at bedtime. Patient taking differently: Inject 15 Units into the skin at bedtime as needed.  03/23/16  Yes Black, Lezlie Octave, NP  lisinopril (PRINIVIL,ZESTRIL) 10 MG tablet TAKE ONE TABLET BY MOUTH IN THE MORNING FOR  BLOOD  PRESSURE 04/17/14  Yes Leone Brand, MD  NOVOLOG 100 UNIT/ML injection USE MAX OF 100 UNITS PER DAY WITH PUMP 01/27/16  Yes Riccio, Angela C, DO  Prenatal Vit-Fe Fumarate-FA (PRENATAL PO) Take 1 tablet by mouth daily.   Yes [provider]  venlafaxine XR (EFFEXOR-XR) 37.5 MG 24 hr capsule Take 37.5 mg by mouth daily with breakfast.   Yes [provider]  ergocalciferol (VITAMIN D2) 50000 units capsule Take 1 capsule (50,000 Units total) by mouth once a week. Patient taking differently: Take 50,000 Units by mouth every Sunday.  03/08/15   Nicholas Lose, MD  insulin detemir (LEVEMIR) 100 UNIT/ML injection Inject 0.15 mLs (15 Units total) into the skin 2 (two) times daily. BACKUP FOR PUMP ONLY Patient not taking: Reported on 09/21/2016 03/23/16   Waldemar Dickens, MD    Family History Family History  Problem Relation Age of Onset  . Cancer Maternal Aunt        breast  . Cancer Maternal Uncle 50        right eye removed s/p cancer  . Hypertension Unknown   . Diabetes Unknown   . Deep vein thrombosis Unknown   . Kidney disease Unknown   . Cancer Maternal Grandfather        prostate ca    Social History Social History  Substance Use Topics  . Smoking  status: Never Smoker  . Smokeless tobacco: Never Used  . Alcohol use No     Allergies   Hydrocodone-acetaminophen; Insulin glargine; Nph iletin i [insulin isophane, mixed nph]; and Lantus [insulin glargine]   Review of Systems Review of Systems  Constitutional: Positive for fatigue. Negative for chills and fever.  HENT: Negative for congestion, rhinorrhea and sore throat.   Eyes: Negative for visual disturbance.  Respiratory: Positive for shortness of breath. Negative for cough and wheezing.   Cardiovascular: Negative for chest pain and leg swelling.  Gastrointestinal: Positive for nausea and vomiting. Negative for abdominal pain and diarrhea.  Endocrine: Positive for polydipsia and polyuria.  Genitourinary: Negative for dysuria, flank pain, vaginal bleeding and vaginal discharge.  Musculoskeletal: Negative for neck pain.  Skin: Negative for rash.  Allergic/Immunologic: Negative for immunocompromised state.  Neurological:  Positive for weakness. Negative for syncope and headaches.  Hematological: Does not bruise/bleed easily.  All other systems reviewed and are negative.    Physical Exam Updated Vital Signs BP 120/72 (BP Location: Right Arm)   Pulse 89   Temp 97.9 F (36.6 C) (Oral)   Resp 17   SpO2 100%   Physical Exam  Constitutional: She is oriented to person, place, and time. She appears well-developed and well-nourished. No distress.  HENT:  Head: Normocephalic and atraumatic.  Eyes: Conjunctivae are normal.  Neck: Neck supple.  Cardiovascular: Normal rate, regular rhythm and normal heart sounds.  Exam reveals no friction rub.   No murmur heard. Pulmonary/Chest: Effort normal and breath sounds normal. No respiratory distress. She has no wheezes. She has no rales.  Abdominal: She exhibits no distension.  Musculoskeletal: She exhibits no edema.  Neurological: She is alert and oriented to person, place, and time. She exhibits normal muscle tone.  Skin: Skin is warm.  Capillary refill takes less than 2 seconds.  Psychiatric: She has a normal mood and affect.  Nursing note and vitals reviewed.    ED Treatments / Results  Labs (all labs ordered are listed, but only abnormal results are displayed) Labs Reviewed  BASIC METABOLIC PANEL - Abnormal; Notable for the following:       Result Value   Sodium 132 (*)    Potassium 5.2 (*)    Chloride 100 (*)    CO2 15 (*)    Glucose, Bld 457 (*)    BUN 27 (*)    Creatinine, Ser 1.27 (*)    GFR calc non Af Amer 49 (*)    GFR calc Af Amer 57 (*)    Anion gap 17 (*)    All other components within normal limits  CBC - Abnormal; Notable for the following:    MCHC 36.6 (*)    All other components within normal limits  BLOOD GAS, VENOUS - Abnormal; Notable for the following:    pH, Ven 7.209 (*)    Bicarbonate 16.9 (*)    Acid-base deficit 10.4 (*)    All other components within normal limits  CBG MONITORING, ED - Abnormal; Notable for the following:    Glucose-Capillary 481 (*)    All other components within normal limits  URINALYSIS, ROUTINE W REFLEX MICROSCOPIC  CBG MONITORING, ED  CBG MONITORING, ED    EKG  EKG Interpretation  Date/Time:  Thursday September 21 2016 08:10:18 EDT Ventricular Rate:  98 PR Interval:    QRS Duration: 78 QT Interval:  353 QTC Calculation: 451 R Axis:   42 Text Interpretation:  Sinus rhythm RAE, consider biatrial enlargement Abnormal R-wave progression, early transition Since last EKG, T waves peaked Confirmed by Duffy Bruce [phone]) on 09/21/2016 8:35:56 AM       Radiology Dg Chest 2 View  Result Date: 09/21/2016 CLINICAL DATA:  Shortness of breath.  Diabetic ketoacidosis EXAM: CHEST  2 VIEW COMPARISON:  March 23, 2016 FINDINGS: Lungs are clear. Heart size and pulmonary vascularity are normal. No adenopathy. There is upper thoracic levoscoliosis. Patient is status post left mastectomy with surgical clips over the left axillary region. No blastic or lytic bone  lesions are evident. IMPRESSION: Lungs clear. No adenopathy. Postoperative changes from mastectomy on the left. Electronically Signed   By: Lowella Grip III M.D.   On: 09/21/2016 08:22    Procedures .Critical Care Performed by: Duffy Bruce Authorized by: Duffy Bruce   Critical care provider statement:  Critical care time (minutes):  35   Critical care time was exclusive of:  Separately billable procedures and treating other patients and teaching time   Critical care was necessary to treat or prevent imminent or life-threatening deterioration of the following conditions:  Circulatory failure and endocrine crisis   Critical care was time spent personally by me on the following activities:  Development of treatment plan with patient or surrogate, discussions with consultants, evaluation of patient's response to treatment, examination of patient, obtaining history from patient or surrogate, ordering and performing treatments and interventions, ordering and review of laboratory studies, ordering and review of radiographic studies, pulse oximetry, re-evaluation of patient's condition and review of old charts   I assumed direction of critical care for this patient from another provider in my specialty: no      (including critical care time)  Medications Ordered in ED Medications  sodium chloride 0.9 % bolus 1,000 mL (1,000 mLs Intravenous New Bag/Given 09/21/16 0803)  insulin regular (NOVOLIN R,HUMULIN R) 100 Units in sodium chloride 0.9 % 100 mL (1 Units/mL) infusion (not administered)  dextrose 5 %-0.45 % sodium chloride infusion (not administered)  lactated ringers infusion (not administered)  ondansetron (ZOFRAN) injection 4 mg (not administered)     Initial Impression / Assessment and Plan / ED Course  I have reviewed the triage vital signs and the nursing notes.  Pertinent labs & imaging results that were available during my care of the patient were reviewed by me and  considered in my medical decision making (see chart for details).    46 year old female with type 1 diabetes her with nausea, vomiting, and hyperglycemia. Labwork shows pH 7.2 with bicarbonate of 15, anion Of 17, consistent with diabetic ketoacidosis. Blood sugar is 457. Patient also has likely prerenal a KI with creatinine of 1.27. She's been given fluids here and will start on insulin drip and admit to stepdown. She is otherwise mentating well. Chest x-ray is clear. EKG nonischemic.  Final Clinical Impressions(s) / ED Diagnoses   Final diagnoses:  Diabetic ketoacidosis without coma associated with type 1 diabetes mellitus ([place])  AKI (acute kidney injury) ([place])  Dehydration      Duffy Bruce, MD 09/21/16 [phone]

## 2016-09-21 NOTE — Progress Notes (Addendum)
Inpatient Diabetes Program Recommendations  AACE/ADA: New Consensus Statement on Inpatient Glycemic Control (2015)  Target Ranges:  Prepandial:   less than 140 mg/dL      Peak postprandial:   less than 180 mg/dL (1-2 hours)      Critically ill patients:  140 - 180 mg/dL   Results for [name], [name] (MRN [id number]) as of 09/21/2016 10:56  Ref. Range 09/21/2016 07:43  Sodium Latest Ref Range: 135 - 145 mmol/L 132 (L)  Potassium Latest Ref Range: 3.5 - 5.1 mmol/L 5.2 (H)  Chloride Latest Ref Range: 101 - 111 mmol/L 100 (L)  CO2 Latest Ref Range: 22 - 32 mmol/L 15 (L)  Glucose Latest Ref Range: 65 - 99 mg/dL 457 (H)  BUN Latest Ref Range: 6 - 20 mg/dL 27 (H)  Creatinine Latest Ref Range: 0.44 - 1.00 mg/dL 1.27 (H)  Calcium Latest Ref Range: 8.9 - 10.3 mg/dL 9.4  Anion gap Latest Ref Range: 5 - 15  17 (H)    Admit with: DKA  History: Type 1 DM  Home DM Meds: Insulin Pump  Current Insulin Orders: IV Insulin Drip      Spoke with patient today.  We inspected her insulin pump and I found that her reservoir was dry.  I questioned pt as to whether or not she thinks she may not have properly secured the reservoir connection to the insulin tubing.  Pt stated she didn't see how she could have made that mistake, however, upon my inspection of the reservoir, the reservoir was completely devoid of insulin.  Pt stated to me that she wonders if the insulin was bad, however, she told me the insulin was stored in the fridge, was an unopened vial, was in date, and was not discolored or cloudy in any way.  Encouraged pt to call the 1-800 telephone number to the pump manufacturer for a safety check.  Patient has extra pump supplies at bedside, but will need Novolog insulin for refill which we can provide.  Checked 2pm BMET which showed Anion gap of 11 and CO2 up to 18.  Per DKA Protocol guidelines, CO2 needs to be at least 20.  Since pt has no nausea, feels better, and the Anion Gap is WNL, we could  transition a bit early, however, best to wait until CO2 is at least 20.     MD- When you decide pt is ready to transition to SQ insulin, please allow pt to resume her insulin pump.  RN can call pharmacy to get a vial of Novolog for pump refill.    Please place orders for the Insulin Pump Order set TID ac + hs + 2am schedule.  Have pt resume her insulin pump when ready, overlap the insulin drip and the insulin pump by at least 1 hour, and then the RN can turn off the Insulin drip.     --Insulin Pump Settings--  Basal Rates: 1.7 units/hr  Total Basal Insulin per 24 hours period= 40.8 units  Carbohydrate Ratio: 1 unit for every 12 Grams of Carbohydrates  Correction/Sensitivity Factor: 1 unit for every 30 mg/dl above Target CBG  Target CBG: 120 mg/dl    --Will follow patient during hospitalization--  Wyn Quaker RN, MSN, CDE Diabetes Coordinator Inpatient Glycemic Control Team Team Pager: [phone] (8a-5p)

## 2016-09-22 DIAGNOSIS — E860 Dehydration: Secondary | ICD-10-CM

## 2016-09-22 DIAGNOSIS — N179 Acute kidney failure, unspecified: Secondary | ICD-10-CM | POA: Diagnosis not present

## 2016-09-22 DIAGNOSIS — I10 Essential (primary) hypertension: Secondary | ICD-10-CM | POA: Diagnosis not present

## 2016-09-22 DIAGNOSIS — E1010 Type 1 diabetes mellitus with ketoacidosis without coma: Secondary | ICD-10-CM | POA: Diagnosis not present

## 2016-09-22 LAB — GLUCOSE, CAPILLARY
GLUCOSE-CAPILLARY: 223 mg/dL — AB (ref 65–99)
GLUCOSE-CAPILLARY: 238 mg/dL — AB (ref 65–99)
GLUCOSE-CAPILLARY: 175 mg/dL — AB (ref 65–99)
GLUCOSE-CAPILLARY: 198 mg/dL — AB (ref 65–99)
GLUCOSE-CAPILLARY: 263 mg/dL — AB (ref 65–99)
Glucose-Capillary: 194 mg/dL — ABNORMAL HIGH (ref 65–99)
Glucose-Capillary: 166 mg/dL — ABNORMAL HIGH (ref 65–99)
Glucose-Capillary: 145 mg/dL — ABNORMAL HIGH (ref 65–99)
Glucose-Capillary: 152 mg/dL — ABNORMAL HIGH (ref 65–99)
Glucose-Capillary: 158 mg/dL — ABNORMAL HIGH (ref 65–99)
Glucose-Capillary: 159 mg/dL — ABNORMAL HIGH (ref 65–99)
Glucose-Capillary: 253 mg/dL — ABNORMAL HIGH (ref 65–99)
Glucose-Capillary: 292 mg/dL — ABNORMAL HIGH (ref 65–99)
Glucose-Capillary: 276 mg/dL — ABNORMAL HIGH (ref 65–99)

## 2016-09-22 LAB — BASIC METABOLIC PANEL
ANION GAP: 8 (ref 5–15)
Anion gap: 7 (ref 5–15)
Anion gap: 8 (ref 5–15)
BUN: 16 mg/dL (ref 6–20)
BUN: 13 mg/dL (ref 6–20)
BUN: 11 mg/dL (ref 6–20)
CALCIUM: 8.7 mg/dL — AB (ref 8.9–10.3)
CHLORIDE: 111 mmol/L (ref 101–111)
CO2: 19 mmol/L — AB (ref 22–32)
CO2: 20 mmol/L — ABNORMAL LOW (ref 22–32)
CO2: 21 mmol/L — ABNORMAL LOW (ref 22–32)
Calcium: 8.6 mg/dL — ABNORMAL LOW (ref 8.9–10.3)
Calcium: 8.5 mg/dL — ABNORMAL LOW (ref 8.9–10.3)
Chloride: 110 mmol/L (ref 101–111)
Chloride: 108 mmol/L (ref 101–111)
Creatinine, Ser: 0.82 mg/dL (ref 0.44–1.00)
Creatinine, Ser: 0.73 mg/dL (ref 0.44–1.00)
Creatinine, Ser: 0.68 mg/dL (ref 0.44–1.00)
GFR calc Af Amer: >60 mL/min (ref >60)
GFR calc Af Amer: >60 mL/min (ref >60)
GFR calc Af Amer: >60 mL/min (ref >60)
GFR calc non Af Amer: >60 mL/min (ref >60)
GFR calc non Af Amer: >60 mL/min (ref >60)
GFR calc non Af Amer: >60 mL/min (ref >60)
GLUCOSE: 234 mg/dL — AB (ref 65–99)
Glucose, Bld: 168 mg/dL — ABNORMAL HIGH (ref 65–99)
Glucose, Bld: 150 mg/dL — ABNORMAL HIGH (ref 65–99)
Potassium: 4.1 mmol/L (ref 3.5–5.1)
Potassium: 3.2 mmol/L — ABNORMAL LOW (ref 3.5–5.1)
Potassium: 3.4 mmol/L — ABNORMAL LOW (ref 3.5–5.1)
Sodium: 138 mmol/L (ref 135–145)
Sodium: 137 mmol/L (ref 135–145)
Sodium: 137 mmol/L (ref 135–145)

## 2016-09-22 MED ORDER — INSULIN PUMP
Freq: Three times a day (TID) | SUBCUTANEOUS | Status: DC
  Administered 2016-09-22: 3.35 via SUBCUTANEOUS
  Administered 2016-09-22: 18:00:00 via SUBCUTANEOUS
  Administered 2016-09-22: 5.75 via SUBCUTANEOUS
  Administered 2016-09-23: 1.4 via SUBCUTANEOUS
  Administered 2016-09-23: 08:00:00 via SUBCUTANEOUS
  Filled 2016-09-22: qty 1

## 2016-09-22 MED ORDER — INSULIN DETEMIR 100 UNIT/ML ~~LOC~~ SOLN
5.0000 [IU] | Freq: Every day | SUBCUTANEOUS | Status: DC
  Administered 2016-09-22 – 2016-09-23 (×2): 5 [IU] via SUBCUTANEOUS
  Filled 2016-09-22 (×2): qty 0.05

## 2016-09-22 MED ORDER — POTASSIUM CHLORIDE CRYS ER 20 MEQ PO TBCR
40.0000 meq | EXTENDED_RELEASE_TABLET | Freq: Once | ORAL | Status: AC
  Administered 2016-09-23: 40 meq via ORAL
  Filled 2016-09-22: qty 2

## 2016-09-22 NOTE — Care Management Note (Signed)
Case Management Note  Patient Details  Name: RALEY NOVICKI MRN: [id number] Date of Birth: [birth year]  Subjective/Objective:    hyperglycemia                Action/Plan: Date:  September 22, 2016 Chart reviewed for concurrent status and case management needs. Will continue to follow patient progress. Discharge Planning: following for needs Expected discharge date: [id number] Velva Harman, BSN, [place], [place]  Expected Discharge Date:   (unknown)               Expected Discharge Plan:  Home/Self Care  In-House Referral:     Discharge planning Services  CM Consult  Post Acute Care Choice:    Choice offered to:     DME Arranged:    DME Agency:     HH Arranged:    [place] Agency:     Status of Service:  In process, will continue to follow  If discussed at Long Length of Stay Meetings, dates discussed:    Additional Comments:  Leeroy Cha, RN 09/22/2016, 8:28 AM

## 2016-09-22 NOTE — Progress Notes (Signed)
PROGRESS NOTE  [name] HFW:[id number] DOB: [birth year] DOA: 09/21/2016 PCP: Gaynelle Arabian, MD   LOS: 0 days   Brief Narrative / Interim history: [name] is an 46 y.o. female with type 1 diabetes mellitus on insulin pump with frequent admissions for DKA most recently 6 months ago who is admitted for DKA.  Assessment & Plan: Principal Problem:   DKA (diabetic ketoacidoses) ([place]) Active Problems:   Essential hypertension   AKI (acute kidney injury) ([place])   DKA -apparently reservoir was empty, patient not sure what happened but it seems like patient went several hours without any insulin likely precipitating her DKA -gap closed, bicarb improved this morning, transition back to pump -extensive discussion with patient bedside on several occasions today, her A1C has been persistently elevated into 11s despite the pump and good apparent education and pump usage. ?dietary compliance  -CBGs in the 200s on pump, will add levemir 5 U tonight and monitor CBG in am. She probably lives in the 200s.  AKI -resolved with fluids  HTN -controlled, continue to hold ACEI due to AKI  Depression -continue home meds  DVT prophylaxis: lovenox Code Status: FUll  Family Communication: no family bedside Disposition Plan: transfer to [facility]  Consultants:   None  Procedures:   none  Antimicrobials:  none   Subjective: - no chest pain, shortness of breath, no abdominal pain, nausea or vomiting.   Objective: Vitals:   09/22/16 1200 09/22/16 1300 09/22/16 1306 09/22/16 1400  BP: (!) 149/74 (!) 160/94 (!) 145/67 (!) 165/102  Pulse: 74 89  93  Resp: 17 20  17   Temp:      TempSrc:      SpO2: 100% 100%  100%  Height:        Intake/Output Summary (Last 24 hours) at 09/22/16 1522 Last data filed at 09/22/16 1200  Gross per 24 hour  Intake          2286.77 ml  Output              400 ml  Net          1886.77 ml   There were no vitals filed for this  visit.  Examination:  Vitals:   09/22/16 1200 09/22/16 1300 09/22/16 1306 09/22/16 1400  BP: (!) 149/74 (!) 160/94 (!) 145/67 (!) 165/102  Pulse: 74 89  93  Resp: 17 20  17   Temp:      TempSrc:      SpO2: 100% 100%  100%  Height:        Constitutional: NAD Eyes:  lids and conjunctivae normal ENMT: Mucous membranes are moist. No oropharyngeal exudates Respiratory: clear to auscultation bilaterally, no wheezing, no crackles. Normal respiratory effort. No accessory muscle use.  Cardiovascular: Regular rate and rhythm, no murmurs / rubs / gallops. No LE edema. 2+ pedal pulses. Abdomen: no tenderness. Bowel sounds positive.  Skin: no rashes, lesions, ulcers. No induration Neurologic: CN 2-12 grossly intact. Strength 5/5 in all 4.  Psychiatric: Normal judgment and insight. Alert and oriented x 3. Normal mood.    Data Reviewed: I have independently reviewed following labs and imaging studies   CBC:  Recent Labs Lab 09/21/16 0743  WBC 9.0  HGB 13.4  HCT 36.6  MCV 79.4  PLT 027   Basic Metabolic Panel:  Recent Labs Lab 09/21/16 1418 09/21/16 2318 09/22/16 0110 09/22/16 0549 09/22/16 0906  NA 137 134* 138 137 137  K 4.4 3.9 4.1 3.2* 3.4*  CL  108 106 111 110 108  CO2 18* 19* 19* 20* 21*  GLUCOSE 211* 187* 234* 168* 150*  BUN 24* 16 16 13 11   CREATININE 0.99 0.74 0.82 0.73 0.68  CALCIUM 8.8* 8.6* 8.7* 8.6* 8.5*   GFR: CrCl cannot be calculated (Unknown ideal weight.). Liver Function Tests: No results for input(s): AST, ALT, ALKPHOS, BILITOT, PROT, ALBUMIN in the last 168 hours. No results for input(s): LIPASE, AMYLASE in the last 168 hours. No results for input(s): AMMONIA in the last 168 hours. Coagulation Profile: No results for input(s): INR, PROTIME in the last 168 hours. Cardiac Enzymes: No results for input(s): CKTOTAL, CKMB, CKMBINDEX, TROPONINI in the last 168 hours. BNP (last 3 results) No results for input(s): PROBNP in the last 8760  hours. HbA1C: No results for input(s): HGBA1C in the last 72 hours. CBG:  Recent Labs Lab 09/22/16 0917 09/22/16 1021 09/22/16 1124 09/22/16 1254 09/22/16 1506  GLUCAP 158* 159* 253* 292* 276*   Lipid Profile: No results for input(s): CHOL, HDL, LDLCALC, TRIG, CHOLHDL, LDLDIRECT in the last 72 hours. Thyroid Function Tests: No results for input(s): TSH, T4TOTAL, FREET4, T3FREE, THYROIDAB in the last 72 hours. Anemia Panel: No results for input(s): VITAMINB12, FOLATE, FERRITIN, TIBC, IRON, RETICCTPCT in the last 72 hours. Urine analysis:    Component Value Date/Time   COLORURINE STRAW (A) 09/21/2016 0803   APPEARANCEUR CLEAR 09/21/2016 0803   LABSPEC 1.021 09/21/2016 0803   PHURINE 5.0 09/21/2016 0803   GLUCOSEU >=500 (A) 09/21/2016 0803   GLUCOSEU 500 (A) 02/04/2013 0854   HGBUR NEGATIVE 09/21/2016 0803   HGBUR negative 07/27/2008 0916   BILIRUBINUR NEGATIVE 09/21/2016 0803   KETONESUR 80 (A) 09/21/2016 0803   PROTEINUR NEGATIVE 09/21/2016 0803   UROBILINOGEN 1.0 11/30/2014 0457   NITRITE NEGATIVE 09/21/2016 0803   LEUKOCYTESUR NEGATIVE 09/21/2016 0803   Sepsis Labs: Invalid input(s): PROCALCITONIN, LACTICIDVEN  Recent Results (from the past 240 hour(s))  MRSA PCR Screening     Status: None   Collection Time: 09/21/16 10:43 AM  Result Value Ref Range Status   MRSA by PCR NEGATIVE NEGATIVE Final    Comment:        The GeneXpert MRSA Assay (FDA approved for NASAL specimens only), is one component of a comprehensive MRSA colonization surveillance program. It is not intended to diagnose MRSA infection nor to guide or monitor treatment for MRSA infections.       Radiology Studies: Dg Chest 2 View  Result Date: 09/21/2016 CLINICAL DATA:  Shortness of breath.  Diabetic ketoacidosis EXAM: CHEST  2 VIEW COMPARISON:  March 23, 2016 FINDINGS: Lungs are clear. Heart size and pulmonary vascularity are normal. No adenopathy. There is upper thoracic levoscoliosis.  Patient is status post left mastectomy with surgical clips over the left axillary region. No blastic or lytic bone lesions are evident. IMPRESSION: Lungs clear. No adenopathy. Postoperative changes from mastectomy on the left. Electronically Signed   By: Lowella Grip III M.D.   On: 09/21/2016 08:22     Scheduled Meds: . atorvastatin  10 mg Oral Daily  . buPROPion  150 mg Oral Daily  . enoxaparin (LOVENOX) injection  40 mg Subcutaneous Q24H  . insulin detemir  5 Units Subcutaneous Daily  . insulin pump   Subcutaneous TID AC, HS, 0200  . lisinopril  10 mg Oral Daily  . potassium chloride  40 mEq Oral Once  . prenatal multivitamin  1 tablet Oral Q1200  . venlafaxine XR  37.5 mg Oral Q breakfast  Continuous Infusions: . sodium chloride Stopped (09/21/16 1249)  . dextrose 5 % and 0.45% NaCl 100 mL/hr at 09/22/16 1200  . insulin (NOVOLIN-R) infusion Stopped (09/22/16 1200)       Time spent: 35 minutes, more than 50% bedside in 3 separate visits discussing pump usage, dietary habits and home CBG checks    Marzetta Board, MD, PhD [facility] Pager [phone]  If 7PM-7AM, please contact night-coverage [email or web address] Password [email or web address] 09/22/2016, 3:22 PM

## 2016-09-22 NOTE — Progress Notes (Signed)
Results for [name], [name] (MRN [id number]) as of 09/22/2016 11:42  Ref. Range 09/22/2016 09:06  Sodium Latest Ref Range: 135 - 145 mmol/L 137  Potassium Latest Ref Range: 3.5 - 5.1 mmol/L 3.4 (L)  Chloride Latest Ref Range: 101 - 111 mmol/L 108  CO2 Latest Ref Range: 22 - 32 mmol/L 21 (L)  Glucose Latest Ref Range: 65 - 99 mg/dL 150 (H)  BUN Latest Ref Range: 6 - 20 mg/dL 11  Creatinine Latest Ref Range: 0.44 - 1.00 mg/dL 0.68  Calcium Latest Ref Range: 8.9 - 10.3 mg/dL 8.5 (L)  Anion gap Latest Ref Range: 5 - 15  8     Note Dr. Cruzita Lederer placed orders to have pt transition off IV Insulin drip to SQ Insulin with her Insulin Pump today.  Called RN to discuss.  Per RN, pt's husband had to go home to get extra insulin pump tubing.  Once he returns, pt will resume home insulin pump and RN can discontinue IV Insulin drip 1-2 hours after Insulin pump restarted.  Reviewed documentation needs of Insulin Pump with RN (CBGs with hospital meter, all insulin boluses given by patient with her pump, Insulin Pump assessment Qshift).  Patient A&O per RN and able to transition to pump independently.     --Will follow patient during hospitalization--  Wyn Quaker RN, MSN, CDE Diabetes Coordinator Inpatient Glycemic Control Team Team Pager: [phone] (8a-5p)

## 2016-09-23 DIAGNOSIS — E1010 Type 1 diabetes mellitus with ketoacidosis without coma: Secondary | ICD-10-CM | POA: Diagnosis not present

## 2016-09-23 DIAGNOSIS — I10 Essential (primary) hypertension: Secondary | ICD-10-CM | POA: Diagnosis not present

## 2016-09-23 DIAGNOSIS — E860 Dehydration: Secondary | ICD-10-CM | POA: Diagnosis not present

## 2016-09-23 DIAGNOSIS — N179 Acute kidney failure, unspecified: Secondary | ICD-10-CM | POA: Diagnosis not present

## 2016-09-23 LAB — GLUCOSE, CAPILLARY
Glucose-Capillary: 162 mg/dL — ABNORMAL HIGH (ref 65–99)
Glucose-Capillary: 112 mg/dL — ABNORMAL HIGH (ref 65–99)

## 2016-09-23 LAB — HEMOGLOBIN A1C
HEMOGLOBIN A1C: 9.5 % — AB (ref 4.8–5.6)
Mean Plasma Glucose: 225.95 mg/dL

## 2016-09-23 NOTE — Care Management Note (Signed)
Case Management Note  Patient Details  Name: [name] MRN: [id number] Date of Birth: Apr 05, 1970  Subjective/Objective:                 Patient with order to DC to home today. Chart reviewed. No Home Health or Equipment needs, no unacknowledged Case Management consults or medication needs identified at the time of this note. Plan for DC to home. If needs arise today prior to discharge, please call Carles Collet RN CM at [phone].    Action/Plan:   Expected Discharge Date:  09/23/16               Expected Discharge Plan:  Home/Self Care  In-House Referral:     Discharge planning Services  CM Consult  Post Acute Care Choice:    Choice offered to:     DME Arranged:    DME Agency:     HH Arranged:    HH Agency:     Status of Service:  Completed, signed off  If discussed at [employer] of Stay Meetings, dates discussed:    Additional Comments:  Carles Collet, RN 09/23/2016, 8:46 AM

## 2016-09-23 NOTE — Progress Notes (Signed)
Inpatient Diabetes Program Recommendations  AACE/ADA: New Consensus Statement on Inpatient Glycemic Control (2015)  Target Ranges:  Prepandial:   less than 140 mg/dL      Peak postprandial:   less than 180 mg/dL (1-2 hours)      Critically ill patients:  140 - 180 mg/dL   Results for Terri, [name] (MRN [id number]) as of 09/23/2016 08:29  Ref. Range 09/22/2016 15:06 09/22/2016 18:12 09/22/2016 21:35 09/23/2016 02:02 09/23/2016 08:05  Glucose-Capillary Latest Ref Range: 65 - 99 mg/dL 276 (H) 198 (H) 263 (H) 162 (H) 112 (H)   Review of Glycemic Control  Glucose trending at a more ideal level with the addition of Levemir 5 units. A1c obtained but pending, expecting to be elevated. Patient will need adjustments in her basal rates when discharged will need to follow up with Endo and pump trainer, Jeanie Sewer, RD.  Thanks,  Tama Headings RN, MSN, [facility] Inpatient Diabetes Coordinator Team Pager [phone] (8a-5p)

## 2016-09-23 NOTE — Discharge Instructions (Signed)
Follow with Dr. Buddy Duty in 10 days as scheduled  Please get a complete blood count and chemistry panel checked by your Primary MD at your next visit, and again as instructed by your Primary MD. Please get your medications reviewed and adjusted by your Primary MD.  Please request your Primary MD to go over all Hospital Tests and Procedure/Radiological results at the follow up, please get all Hospital records sent to your Prim MD by signing hospital release before you go home.  If you had Pneumonia of Lung problems at the Hospital: Please get a 2 view Chest X ray done in 6-8 weeks after hospital discharge or sooner if instructed by your Primary MD.  If you have Congestive Heart Failure: Please call your Cardiologist or Primary MD anytime you have any of the following symptoms:  1) 3 pound weight gain in 24 hours or 5 pounds in 1 week  2) shortness of breath, with or without a dry hacking cough  3) swelling in the hands, feet or stomach  4) if you have to sleep on extra pillows at night in order to breathe  Follow cardiac low salt diet and 1.5 lit/day fluid restriction.  If you have diabetes Accuchecks 4 times/day, Once in AM empty stomach and then before each meal. Log in all results and show them to your primary doctor at your next visit. If any glucose reading is under 80 or above 300 call your primary MD immediately.  If you have Seizure/Convulsions/Epilepsy: Please do not drive, operate heavy machinery, participate in activities at heights or participate in high speed sports until you have seen by Primary MD or a Neurologist and advised to do so again.  If you had Gastrointestinal Bleeding: Please ask your Primary MD to check a complete blood count within one week of discharge or at your next visit. Your endoscopic/colonoscopic biopsies that are pending at the time of discharge, will also need to followed by your Primary MD.  Get Medicines reviewed and adjusted. Please take all your  medications with you for your next visit with your Primary MD  Please request your Primary MD to go over all hospital tests and procedure/radiological results at the follow up, please ask your Primary MD to get all Hospital records sent to his/her office.  If you experience worsening of your admission symptoms, develop shortness of breath, life threatening emergency, suicidal or homicidal thoughts you must seek medical attention immediately by calling 911 or calling your MD immediately  if symptoms less severe.  You must read complete instructions/literature along with all the possible adverse reactions/side effects for all the Medicines you take and that have been prescribed to you. Take any new Medicines after you have completely understood and accpet all the possible adverse reactions/side effects.   Do not drive or operate heavy machinery when taking Pain medications.   Do not take more than prescribed Pain, Sleep and Anxiety Medications  Special Instructions: If you have smoked or chewed Tobacco  in the last 2 yrs please stop smoking, stop any regular Alcohol  and or any Recreational drug use.  Wear Seat belts while driving.  Please note You were cared for by a hospitalist during your hospital stay. If you have any questions about your discharge medications or the care you received while you were in the hospital after you are discharged, you can call the unit and asked to speak with the hospitalist on call if the hospitalist that took care of you is not available.  Once you are discharged, your primary care physician will handle any further medical issues. Please note that NO REFILLS for any discharge medications will be authorized once you are discharged, as it is imperative that you return to your primary care physician (or establish a relationship with a primary care physician if you do not have one) for your aftercare needs so that they can reassess your need for medications and monitor your  lab values.  You can reach the hospitalist office at phone [phone] or fax [phone]   If you do not have a primary care physician, you can call [phone] for a physician referral.  Activity: As tolerated with Full fall precautions use walker/cane & assistance as needed  Diet: carb modified  Disposition Home

## 2016-09-23 NOTE — Discharge Summary (Signed)
Physician Discharge Summary  [name] HTD:[id number] DOB: 15-Dec-1970 DOA: 09/21/2016  PCP: Gaynelle Arabian, MD  Admit date: 09/21/2016 Discharge date: 09/23/2016  Admitted From: Home Disposition: Home  Recommendations for Outpatient Follow-up:  1. Follow up with Dr Buddy Duty as scheduled in 10 days (patient tells me she has an appointment on 10/03/2016)  Home Health: None Equipment/Devices: None  Discharge Condition: Stable CODE STATUS: Full code  Diet recommendation: carb modified  HPI: Per Dr. Jamse Arn, [name] is an 46 y.o. female with type 1 diabetes mellitus on insulin pump with frequent admissions for DKA most recently 6 months ago who is admitted for DKA. Patient states she was in her usual state of moderately good health until yesterday when she changed her insulin vial out in her insulin pump. Patient states that it was a new vial that had been in the refrigerator. After she changed out the vial, she noted that she was feeling "unwell", patient is unable to say exactly what that is but she knows what it feels like. After dinner last night patient noted that she had polyuria and increased thirst and relies that her sugar was high. She said she was bolusing herself with her pump. Her sugar at 440 this morning was 344 and she said she was feeling better. However at 6:30 this morning patient started having nausea and vomiting and new that she was in DKA so presented to ED. Patient states she has not eaten anything since supper last night. ED Course:  The patient was treated with aggressive fluid resuscitation and DKA protocol with glucose stabilizer.  Hospital Course: Discharge Diagnoses:  Principal Problem:   DKA (diabetic ketoacidoses) ([place]) Active Problems:   Essential hypertension   AKI (acute kidney injury) ([place])   DKA in the setting of type 1 diabetes mellitus -patient was admitted to stepdown with diabetic ketoacidosis.  She was placed on insulin infusion,  aggressive fluid resuscitation.  Cause for her DKA was likely due to the fact that she did not get insulin as her reservoir was found to be empty diabetes educator on admission.  This is not clear how it happened.  Following insulin infusion and fluids, patient's acidosis resolved, her anion gap is closed and her symptoms have resolved.  She was transitioned back to her insulin pump while in the hospital with the assistance of diabetes educator, and she was monitored for an additional 24 hours.  Her CBGs have started to increase on pump, and 5 units of Levemir were added in addition to her basal rate. This seems to have improved her CBGs and fasting glucose the next morning was 112.  She has an appointment with her endocrinologist in 10 days, she was strongly advised to keep up this appointment and she may need further adjustments to her insulin pump settings.  She has a continuous CBG monitoring system and a refill is waiting to be picked up from the pharmacy.  Her A1c was checked and it was 9.5, improved from 11.8 6 months ago.  She was back to baseline, able tolerate a regular diet, CBGs were controlled and she was discharged home in stable condition. AKI -resolved with fluids HTN -controlled, continue home regimen Depression -continue home meds  Discharge Instructions   Allergies as of 09/23/2016      Reactions   Hydrocodone-acetaminophen Itching, Swelling   Insulin Glargine Itching, Nausea And Vomiting, Swelling   Nph Iletin I [insulin Isophane, Mixed Nph] Itching, Nausea And Vomiting, Swelling   Lantus [insulin  Glargine] Nausea And Vomiting, Swelling      Medication List    TAKE these medications   atorvastatin 10 MG tablet Commonly known as:  LIPITOR Take 10 mg by mouth daily.   buPROPion 150 MG 24 hr tablet Commonly known as:  WELLBUTRIN XL Take 150 mg by mouth daily.   ergocalciferol 50000 units capsule Commonly known as:  VITAMIN D2 Take 1 capsule (50,000 Units total) by mouth  once a week. What changed:  when to take this   insulin detemir 100 UNIT/ML injection Commonly known as:  LEVEMIR Inject 0.15 mLs (15 Units total) into the skin 2 (two) times daily. BACKUP FOR PUMP ONLY What changed:  Another medication with the same name was removed. Continue taking this medication, and follow the directions you see here.   lisinopril 10 MG tablet Commonly known as:  PRINIVIL,ZESTRIL TAKE ONE TABLET BY MOUTH IN THE MORNING FOR  BLOOD  PRESSURE   NOVOLOG 100 UNIT/ML injection Generic drug:  insulin aspart USE MAX OF 100 UNITS PER DAY WITH PUMP   PRENATAL PO Take 1 tablet by mouth daily.   venlafaxine XR 37.5 MG 24 hr capsule Commonly known as:  EFFEXOR-XR Take 37.5 mg by mouth daily with breakfast.      Follow-up Information    Terri Rend, MD Follow up in 10 day(s).   Specialty:  Endocrinology Contact information: [address] [place] [address] [phone]          Allergies  Allergen Reactions  . Hydrocodone-Acetaminophen Itching and Swelling  . Insulin Glargine Itching, Nausea And Vomiting and Swelling  . Nph Iletin I [Insulin Isophane, Mixed Nph] Itching, Nausea And Vomiting and Swelling  . Lantus [Insulin Glargine] Nausea And Vomiting and Swelling    Consultations:  None   Procedures/Studies:  Dg Chest 2 View  Result Date: 09/21/2016 CLINICAL DATA:  Shortness of breath.  Diabetic ketoacidosis EXAM: CHEST  2 VIEW COMPARISON:  March 23, 2016 FINDINGS: Lungs are clear. Heart size and pulmonary vascularity are normal. No adenopathy. There is upper thoracic levoscoliosis. Patient is status post left mastectomy with surgical clips over the left axillary region. No blastic or lytic bone lesions are evident. IMPRESSION: Lungs clear. No adenopathy. Postoperative changes from mastectomy on the left. Electronically Signed   By: Lowella Grip III M.D.   On: 09/21/2016 08:22      Subjective: - no chest pain, shortness  of breath, no abdominal pain, nausea or vomiting.   Discharge Exam: Vitals:   09/23/16 0207 09/23/16 0602  BP: 128/82 134/78  Pulse: 76 77  Resp: 16 16  Temp: 98.5 F (36.9 C) 98.1 F (36.7 C)  SpO2: 100% 99%   Vitals:   09/22/16 1600 09/22/16 2141 09/23/16 0207 09/23/16 0602  BP: 126/66 138/86 128/82 134/78  Pulse:  68 76 77  Resp: 19 16 16 16   Temp: 98.4 F (36.9 C) 98.4 F (36.9 C) 98.5 F (36.9 C) 98.1 F (36.7 C)  TempSrc: Oral Oral Oral Oral  SpO2:  99% 100% 99%  Height:        General: Pt is alert, awake, not in acute distress Cardiovascular: RRR, S1/S2 +, no rubs, no gallops Respiratory: CTA bilaterally, no wheezing, no rhonchi Abdominal: Soft, NT, ND, bowel sounds + Extremities: no edema, no cyanosis    The results of significant diagnostics from this hospitalization (including imaging, microbiology, ancillary and laboratory) are listed below for reference.     Microbiology: Recent Results (from the  past 240 hour(s))  MRSA PCR Screening     Status: None   Collection Time: 09/21/16 10:43 AM  Result Value Ref Range Status   MRSA by PCR NEGATIVE NEGATIVE Final    Comment:        The GeneXpert MRSA Assay (FDA approved for NASAL specimens only), is one component of a comprehensive MRSA colonization surveillance program. It is not intended to diagnose MRSA infection nor to guide or monitor treatment for MRSA infections.      Labs: BNP (last 3 results) No results for input(s): BNP in the last 8760 hours. Basic Metabolic Panel:  Recent Labs Lab 09/21/16 1418 09/21/16 2318 09/22/16 0110 09/22/16 0549 09/22/16 0906  NA 137 134* 138 137 137  K 4.4 3.9 4.1 3.2* 3.4*  CL 108 106 111 110 108  CO2 18* 19* 19* 20* 21*  GLUCOSE 211* 187* 234* 168* 150*  BUN 24* 16 16 13 11   CREATININE 0.99 0.74 0.82 0.73 0.68  CALCIUM 8.8* 8.6* 8.7* 8.6* 8.5*   Liver Function Tests: No results for input(s): AST, ALT, ALKPHOS, BILITOT, PROT, ALBUMIN in the last  168 hours. No results for input(s): LIPASE, AMYLASE in the last 168 hours. No results for input(s): AMMONIA in the last 168 hours. CBC:  Recent Labs Lab 09/21/16 0743  WBC 9.0  HGB 13.4  HCT 36.6  MCV 79.4  PLT 266   Cardiac Enzymes: No results for input(s): CKTOTAL, CKMB, CKMBINDEX, TROPONINI in the last 168 hours. BNP: Invalid input(s): POCBNP CBG:  Recent Labs Lab 09/22/16 1506 09/22/16 1812 09/22/16 2135 09/23/16 0202 09/23/16 0805  GLUCAP 276* 198* 263* 162* 112*   D-Dimer No results for input(s): DDIMER in the last 72 hours. Hgb A1c No results for input(s): HGBA1C in the last 72 hours. Lipid Profile No results for input(s): CHOL, HDL, LDLCALC, TRIG, CHOLHDL, LDLDIRECT in the last 72 hours. Thyroid function studies No results for input(s): TSH, T4TOTAL, T3FREE, THYROIDAB in the last 72 hours.  Invalid input(s): FREET3 Anemia work up No results for input(s): VITAMINB12, FOLATE, FERRITIN, TIBC, IRON, RETICCTPCT in the last 72 hours. Urinalysis    Component Value Date/Time   COLORURINE STRAW (A) 09/21/2016 0803   APPEARANCEUR CLEAR 09/21/2016 0803   LABSPEC 1.021 09/21/2016 0803   PHURINE 5.0 09/21/2016 0803   GLUCOSEU >=500 (A) 09/21/2016 0803   GLUCOSEU 500 (A) 02/04/2013 0854   HGBUR NEGATIVE 09/21/2016 0803   HGBUR negative 07/27/2008 0916   BILIRUBINUR NEGATIVE 09/21/2016 0803   KETONESUR 80 (A) 09/21/2016 0803   PROTEINUR NEGATIVE 09/21/2016 0803   UROBILINOGEN 1.0 11/30/2014 0457   NITRITE NEGATIVE 09/21/2016 0803   LEUKOCYTESUR NEGATIVE 09/21/2016 0803   Sepsis Labs Invalid input(s): PROCALCITONIN,  WBC,  LACTICIDVEN Microbiology Recent Results (from the past 240 hour(s))  MRSA PCR Screening     Status: None   Collection Time: 09/21/16 10:43 AM  Result Value Ref Range Status   MRSA by PCR NEGATIVE NEGATIVE Final    Comment:        The GeneXpert MRSA Assay (FDA approved for NASAL specimens only), is one component of a comprehensive MRSA  colonization surveillance program. It is not intended to diagnose MRSA infection nor to guide or monitor treatment for MRSA infections.      Time coordinating discharge: 35 minutes  SIGNED:  Marzetta Board, MD  [facility] 09/23/2016, 8:42 AM Pager [phone]  If 7PM-7AM, please contact night-coverage [email or web address] Password [email or web address]

## 2016-09-27 DIAGNOSIS — C50919 Malignant neoplasm of unspecified site of unspecified female breast: Secondary | ICD-10-CM | POA: Diagnosis not present

## 2016-09-27 DIAGNOSIS — E1065 Type 1 diabetes mellitus with hyperglycemia: Secondary | ICD-10-CM | POA: Diagnosis not present

## 2016-09-29 DIAGNOSIS — E065 Other chronic thyroiditis: Secondary | ICD-10-CM | POA: Diagnosis not present

## 2016-09-29 DIAGNOSIS — Z794 Long term (current) use of insulin: Secondary | ICD-10-CM | POA: Diagnosis not present

## 2016-09-29 DIAGNOSIS — E1065 Type 1 diabetes mellitus with hyperglycemia: Secondary | ICD-10-CM | POA: Diagnosis not present

## 2016-10-30 DIAGNOSIS — Z794 Long term (current) use of insulin: Secondary | ICD-10-CM | POA: Diagnosis not present

## 2016-10-30 DIAGNOSIS — E109 Type 1 diabetes mellitus without complications: Secondary | ICD-10-CM | POA: Diagnosis not present

## 2016-10-30 DIAGNOSIS — E1065 Type 1 diabetes mellitus with hyperglycemia: Secondary | ICD-10-CM | POA: Diagnosis not present

## 2016-11-01 DIAGNOSIS — E1065 Type 1 diabetes mellitus with hyperglycemia: Secondary | ICD-10-CM | POA: Diagnosis not present

## 2016-11-01 DIAGNOSIS — E065 Other chronic thyroiditis: Secondary | ICD-10-CM | POA: Diagnosis not present

## 2016-11-01 DIAGNOSIS — Z794 Long term (current) use of insulin: Secondary | ICD-10-CM | POA: Diagnosis not present

## 2016-12-14 DIAGNOSIS — E109 Type 1 diabetes mellitus without complications: Secondary | ICD-10-CM | POA: Diagnosis not present

## 2016-12-14 DIAGNOSIS — Z794 Long term (current) use of insulin: Secondary | ICD-10-CM | POA: Diagnosis not present

## 2016-12-14 DIAGNOSIS — E1065 Type 1 diabetes mellitus with hyperglycemia: Secondary | ICD-10-CM | POA: Diagnosis not present

## 2016-12-22 ENCOUNTER — Ambulatory Visit (HOSPITAL_BASED_OUTPATIENT_CLINIC_OR_DEPARTMENT_OTHER): Payer: Commercial Managed Care - PPO | Admitting: Adult Health

## 2016-12-22 ENCOUNTER — Other Ambulatory Visit: Payer: Self-pay | Admitting: Adult Health

## 2016-12-22 ENCOUNTER — Encounter: Payer: Commercial Managed Care - PPO | Admitting: Adult Health

## 2016-12-22 ENCOUNTER — Ambulatory Visit (HOSPITAL_COMMUNITY)
Admission: RE | Admit: 2016-12-22 | Discharge: 2016-12-22 | Disposition: A | Discharge status: Home / Self Care | Payer: Commercial Managed Care - PPO | Source: Ambulatory Visit | Attending: Adult Health | Admitting: Adult Health

## 2016-12-22 ENCOUNTER — Encounter: Payer: Self-pay | Admitting: Adult Health

## 2016-12-22 VITALS — BP 131/80 | HR 99 | Temp 98.7°F | Resp 20 | Ht 65.0 in | Wt 202.9 lb

## 2016-12-22 DIAGNOSIS — M1288 Other specific arthropathies, not elsewhere classified, other specified site: Secondary | ICD-10-CM | POA: Diagnosis not present

## 2016-12-22 DIAGNOSIS — Z171 Estrogen receptor negative status [ER-]: Principal | ICD-10-CM

## 2016-12-22 DIAGNOSIS — M47816 Spondylosis without myelopathy or radiculopathy, lumbar region: Secondary | ICD-10-CM | POA: Diagnosis not present

## 2016-12-22 DIAGNOSIS — C50412 Malignant neoplasm of upper-outer quadrant of left female breast: Secondary | ICD-10-CM

## 2016-12-22 DIAGNOSIS — M5430 Sciatica, unspecified side: Secondary | ICD-10-CM | POA: Diagnosis not present

## 2016-12-22 DIAGNOSIS — Z1239 Encounter for other screening for malignant neoplasm of breast: Secondary | ICD-10-CM

## 2016-12-22 DIAGNOSIS — Z853 Personal history of malignant neoplasm of breast: Secondary | ICD-10-CM

## 2016-12-22 DIAGNOSIS — M25551 Pain in right hip: Secondary | ICD-10-CM | POA: Diagnosis not present

## 2016-12-22 DIAGNOSIS — M1611 Unilateral primary osteoarthritis, right hip: Secondary | ICD-10-CM | POA: Diagnosis not present

## 2016-12-22 DIAGNOSIS — C50912 Malignant neoplasm of unspecified site of left female breast: Secondary | ICD-10-CM

## 2016-12-22 MED ORDER — ERGOCALCIFEROL 1.25 MG (50000 UT) PO CAPS: 50000.0000 [IU] | ORAL_CAPSULE | ORAL | 3 refills | Status: AC

## 2016-12-22 NOTE — Progress Notes (Signed)
CLINIC:  Survivorship   REASON FOR VISIT:  Routine follow-up for history of breast cancer.   BRIEF ONCOLOGIC HISTORY:    Cancer of left breast ([place])   01/19/2011 Initial Diagnosis    Cancer of left breast      01/27/2011 Procedure    Genetic testing was negative for BRCA1 and BRCA2 gene mutations      02/21/2011 Surgery    Left modified radical mastectomy with axillary lymph node dissection T2 N2 M0 ER 0%, PR 0%, HER-2 negative ratio 1.41, multifocal grade 3 largest 2.4 cm other foci 1.2, 1, 1, 0.2 cm with high-grade DCIS, and VI, 7/14 lymph nodes positive      03/22/2011 - 07/17/2011 Chemotherapy    NSABP B 49 clinical trial randomized to 6 cycles of Taxotere and Cytoxan      08/29/2011 - 10/10/2011 Radiation Therapy    Adjuvant radiation therapy      02/12/2014 Imaging    CT chest abdomen pelvis and bone scan were normal the test was done to evaluate elevated alkaline phosphatase        INTERVAL HISTORY:  Ms. Dea presents to the [facility] today for routine follow-up for her history of breast cancer.  Overall, she reports feeling quite well. She says that her right hip had been hurting for a few months.  She has not yet had it evaluated.  This is slowly worsening.  This hip pain started a few months ago.  She cannot recall a precipitating event.  The pain is experienced mostly with initial movements, such as standing up.  It is worse with sitting, and longer periods of immobility.      REVIEW OF SYSTEMS:  Review of Systems  Constitutional: Negative for appetite change, chills, fatigue, fever and unexpected weight change.  HENT:   Negative for hearing loss, lump/mass, sore throat and voice change.   Eyes: Negative for eye problems and icterus.  Respiratory: Negative for chest tightness, cough and shortness of breath.   Cardiovascular: Negative for chest pain, leg swelling and palpitations.  Gastrointestinal: Negative for abdominal distention, abdominal  pain, constipation, diarrhea, nausea and vomiting.  Endocrine: Negative for hot flashes.  Skin: Negative for rash.  Neurological: Negative for dizziness, extremity weakness and headaches.  Hematological: Negative for adenopathy. Does not bruise/bleed easily.  Psychiatric/Behavioral: Negative for depression. The patient is not nervous/anxious.    Breast: Denies any new nodularity, masses, tenderness, nipple changes, or nipple discharge.       PAST MEDICAL/SURGICAL HISTORY:  Past Medical History:  Diagnosis Date  . Allergy   . Blood clot in vein    nonocclusive clot right subclavian vein, on Xarelto for 6 months  . Breast cancer ([place])    T2N2 tiple negative left breast ca   . Breast cancer ([place])   . Breast mass in female    fibrocystic changes  . Cataracts, bilateral   . Diabetes mellitus    insulin dependant - uses pump, Dr. Dwyane Dee  . History of chemotherapy    taxotere/cytoxan with neulasta support  . Hyperlipidemia   . Hypertension   . Lymphedema of arm    left upper extremity  . Nausea   . Nausea & vomiting    for approx. 2 weeks  . Radiation 08/21/11-10/05/11   Left chestwll/Supraclav./left PAB/left scar  . S/P chemotherapy, time since 4-12 weeks    Past Surgical History:  Procedure Laterality Date  . ABDOMINAL HYSTERECTOMY  2002   still has ovaries   .  bladder tacking  01/2009  . left mastectomy  02/21/2011  . MASS EXCISION  02/19/2012   Procedure: MINOR EXCISION OF MASS;  Surgeon: Adin Hector, MD;  Location: [facility];  Service: General;  Laterality: Left;  Marland Kitchen MASTECTOMY MODIFIED RADICAL  02/21/11   left,invasive  multifocal grade III ductal ca,high grade dcis,lymph/vascular invasion invasion (7/14) nodes positive mets  . PORT-A-CATH REMOVAL  10/13/2011   Procedure: MINOR REMOVAL PORT-A-CATH;  Surgeon: Adin Hector, MD;  Location: [facility];  Service: General;  Laterality: Right;  PORT-A CATH REMOVAL  . PORTACATH PLACEMENT   02/21/2011   Procedure: INSERTION PORT-A-CATH;  Surgeon: Adin Hector, MD;  Location: [facility];  Service: General;  Laterality: N/A;  . SHOULDER SURGERY  04/26/10   left  . TUBAL LIGATION  12/1999     ALLERGIES:  Allergies  Allergen Reactions  . Hydrocodone-Acetaminophen Itching and Swelling  . Insulin Glargine Itching, Nausea And Vomiting and Swelling  . Nph Iletin I [Insulin Isophane, Mixed Nph] Itching, Nausea And Vomiting and Swelling  . Lantus [Insulin Glargine] Nausea And Vomiting and Swelling     CURRENT MEDICATIONS:  Outpatient Encounter Medications as of 12/22/2016  Medication Sig Note  . atorvastatin (LIPITOR) 10 MG tablet Take 10 mg by mouth daily.   Marland Kitchen buPROPion (WELLBUTRIN XL) 150 MG 24 hr tablet Take 150 mg by mouth daily.    . ergocalciferol (VITAMIN D2) 50000 units capsule Take 1 capsule (50,000 Units total) by mouth once a week. (Patient taking differently: Take 50,000 Units by mouth every Sunday. )   . insulin detemir (LEVEMIR) 100 UNIT/ML injection Inject 0.15 mLs (15 Units total) into the skin 2 (two) times daily. BACKUP FOR PUMP ONLY (Patient not taking: Reported on 09/21/2016)   . lisinopril (PRINIVIL,ZESTRIL) 10 MG tablet TAKE ONE TABLET BY MOUTH IN THE MORNING FOR  BLOOD  PRESSURE   . NOVOLOG 100 UNIT/ML injection USE MAX OF 100 UNITS PER DAY WITH PUMP 09/21/2016: Continuous  . Prenatal Vit-Fe Fumarate-FA (PRENATAL PO) Take 1 tablet by mouth daily.   Marland Kitchen venlafaxine XR (EFFEXOR-XR) 37.5 MG 24 hr capsule Take 37.5 mg by mouth daily with breakfast.    No facility-administered encounter medications on file as of 12/22/2016.      ONCOLOGIC FAMILY HISTORY:  Family History  Problem Relation Age of Onset  . Cancer Maternal Aunt        breast  . Cancer Maternal Uncle 50        right eye removed s/p cancer  . Hypertension Unknown   . Diabetes Unknown   . Deep vein thrombosis Unknown   . Kidney disease Unknown   . Cancer Maternal Grandfather        prostate ca     GENETIC COUNSELING/TESTING: Eligible for updated testing  SOCIAL HISTORY:  RAEGEN TARPLEY is married and lives with her husband and three children in [place], [place].  Her children are 9, 18 and 17.   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  She denies any current or history of tobacco, alcohol, or illicit drug use.     PHYSICAL EXAMINATION:  Vital Signs: Vitals:   12/22/16 1348  BP: 131/80  Pulse: 99  Resp: 20  Temp: 98.7 F (37.1 C)  SpO2: 99%   Filed Weights   12/22/16 1348  Weight: 202 lb 14.4 oz (92 kg)   General: Well-nourished, well-appearing female in no acute distress. Accompanied  by her husband today.   HEENT: Head is normocephalic.  Pupils equal and reactive to light. Conjunctivae clear without exudate.  Sclerae anicteric. Oral mucosa is pink, moist.  Oropharynx is pink without lesions or erythema.  Lymph: No cervical, supraclavicular, or infraclavicular lymphadenopathy noted on palpation.  Cardiovascular: Regular rate and rhythm.Marland Kitchen Respiratory: Clear to auscultation bilaterally. Chest expansion symmetric; breathing non-labored.  Breast Exam:  -Left breast: breast is surgically absent, no nodularity, thickening or sign of recurrence.  -Right breast: No appreciable masses on palpation. No skin redness, thickening, or peau d'orange appearance; no nipple retraction or nipple discharge;  -Axilla: No axillary adenopathy bilaterally.  GI: Abdomen soft and round; non-tender, non-distended. Bowel sounds normoactive. No hepatosplenomegaly.   GU: Deferred.  Neuro: No focal deficits. Steady gait.  Psych: Mood and affect normal and appropriate for situation.  MSK: No focal spinal tenderness to palpation, full range of motion in bilateral upper extremities Extremities: No edema. Skin: Warm and dry.  LABORATORY DATA:  None for this visit   DIAGNOSTIC IMAGING:  Most recent mammogram:     ASSESSMENT AND PLAN:   Ms.. Hesch is a pleasant 46 y.o. female with history of Stage IIIA left breast invasive ductal carcinoma, ER-/PR-/HER2-, diagnosed in 12/2010, treated with mastectomy, adjuvant chemotherapy, and adjuvant radiation therapy.  She [facility] the [facility] for surveillance and routine follow-up.   1. History of breast cancer:  Ms. Danish is currently clinically and radiographically without evidence of disease or recurrence of breast cancer. She will be due for mammogram in 01/2017; orders placed today.  She is also eligible for genetic re-testing as she was less than 14 years old with a  Triple negative breast cancer.  I will send the referral to genetics.  I encouraged her to call me with any questions or concerns before her next visit at the cancer center, and I would be happy to see her sooner, if needed.    2. Right hip pain: Likely musculoskeletal.  I ordered plain films of the hip and lumbar spine to fully evaluate.  Recommended aleve bid x 1 week and if not improved to f/u with PCP (assuming xrays are normal).  3. Cancer screening:  Due to Ms. Cuccia's history and her age, she should receive screening for skin cancers, colon cancer, and gynecologic cancers. She was encouraged to follow-up with her PCP for appropriate cancer screenings.   4. Health maintenance and wellness promotion: Ms. Kindig was encouraged to consume 5-7 servings of fruits and vegetables per day. She was also encouraged to engage in moderate to vigorous exercise for 30 minutes per day most days of the week. She was instructed to limit her alcohol consumption and continue to abstain from tobacco use.      Dispo:  -Return to cancer center in one year for LTS follow up -Genetic retesting -Mammogram in 01/2017   A total of (30) minutes of face-to-face time was spent with this patient with greater than 50% of that time in counseling and care-coordination.   Gardenia Phlegm, NP Survivorship Program [place] [phone]   Note: PRIMARY CARE PROVIDER Gaynelle Arabian, [place] [phone]

## 2016-12-25 ENCOUNTER — Telehealth: Payer: Self-pay | Admitting: Adult Health

## 2016-12-25 NOTE — Telephone Encounter (Signed)
Spoke with patient regarding appts added per 11/20 sch msg.  °

## 2016-12-26 ENCOUNTER — Telehealth: Payer: Self-pay

## 2016-12-26 NOTE — Telephone Encounter (Signed)
-----   Message from Gardenia Phlegm, NP sent at 12/26/2016  3:51 PM EST ----- Normal.  Degeneration noted.  No cancer ----- Message ----- From: Juliane Poot, LPN Sent: [phone]  12:08 PM To: Gardenia Phlegm, NP  Patient called today wanting xray results. Can you help? Please?

## 2016-12-26 NOTE — Telephone Encounter (Signed)
Left vm for patient to callback for results 

## 2016-12-27 ENCOUNTER — Telehealth: Payer: Self-pay

## 2016-12-27 DIAGNOSIS — E1065 Type 1 diabetes mellitus with hyperglycemia: Secondary | ICD-10-CM | POA: Diagnosis not present

## 2016-12-27 DIAGNOSIS — C50919 Malignant neoplasm of unspecified site of unspecified female breast: Secondary | ICD-10-CM | POA: Diagnosis not present

## 2016-12-27 DIAGNOSIS — Z794 Long term (current) use of insulin: Secondary | ICD-10-CM | POA: Diagnosis not present

## 2016-12-27 NOTE — Telephone Encounter (Signed)
Patient returned call from LPN For results from xray.  Results given.  Patient voiced understanding.  No concerns from patient at this time.

## 2017-01-03 ENCOUNTER — Telehealth: Payer: Self-pay | Admitting: Adult Health

## 2017-01-03 NOTE — Telephone Encounter (Signed)
Left message for patient re appt. Schedule mailed

## 2017-01-12 DIAGNOSIS — E065 Other chronic thyroiditis: Secondary | ICD-10-CM | POA: Diagnosis not present

## 2017-01-12 DIAGNOSIS — Z23 Encounter for immunization: Secondary | ICD-10-CM | POA: Diagnosis not present

## 2017-01-12 DIAGNOSIS — Z794 Long term (current) use of insulin: Secondary | ICD-10-CM | POA: Diagnosis not present

## 2017-01-12 DIAGNOSIS — E1065 Type 1 diabetes mellitus with hyperglycemia: Secondary | ICD-10-CM | POA: Diagnosis not present

## 2017-01-18 DIAGNOSIS — E109 Type 1 diabetes mellitus without complications: Secondary | ICD-10-CM | POA: Diagnosis not present

## 2017-01-18 DIAGNOSIS — E1065 Type 1 diabetes mellitus with hyperglycemia: Secondary | ICD-10-CM | POA: Diagnosis not present

## 2017-01-18 DIAGNOSIS — Z794 Long term (current) use of insulin: Secondary | ICD-10-CM | POA: Diagnosis not present

## 2017-01-22 ENCOUNTER — Other Ambulatory Visit: Payer: Self-pay | Admitting: Adult Health

## 2017-01-22 DIAGNOSIS — Z853 Personal history of malignant neoplasm of breast: Secondary | ICD-10-CM

## 2017-01-22 DIAGNOSIS — Z1231 Encounter for screening mammogram for malignant neoplasm of breast: Secondary | ICD-10-CM

## 2017-01-25 DIAGNOSIS — C50912 Malignant neoplasm of unspecified site of left female breast: Secondary | ICD-10-CM | POA: Diagnosis not present

## 2017-02-06 ENCOUNTER — Encounter: Payer: Self-pay | Admitting: Genetic Counselor

## 2017-02-07 ENCOUNTER — Encounter: Payer: Self-pay | Admitting: Genetic Counselor

## 2017-02-07 ENCOUNTER — Inpatient Hospital Stay: Payer: Commercial Managed Care - PPO | Attending: Genetic Counselor | Admitting: Genetic Counselor

## 2017-02-07 ENCOUNTER — Inpatient Hospital Stay: Payer: Commercial Managed Care - PPO

## 2017-02-07 DIAGNOSIS — Z803 Family history of malignant neoplasm of breast: Secondary | ICD-10-CM

## 2017-02-07 DIAGNOSIS — C50412 Malignant neoplasm of upper-outer quadrant of left female breast: Secondary | ICD-10-CM | POA: Diagnosis not present

## 2017-02-07 DIAGNOSIS — Z8042 Family history of malignant neoplasm of prostate: Secondary | ICD-10-CM | POA: Diagnosis not present

## 2017-02-07 DIAGNOSIS — Z171 Estrogen receptor negative status [ER-]: Secondary | ICD-10-CM

## 2017-02-07 NOTE — Progress Notes (Signed)
REFERRING PROVIDER: Gaynelle Arabian, MD [address] [place], [place] [address]  PRIMARY PROVIDER:  Gaynelle Arabian, MD  PRIMARY REASON FOR VISIT:  1. Malignant neoplasm of upper-outer quadrant of left breast in female, estrogen receptor negative ([place])   2. Family history of breast cancer   3. Family history of prostate cancer      HISTORY OF PRESENT ILLNESS:   [name], a 47 y.o. female, was seen for a [facility] cancer genetics consultation at the request of Dr. Marisue Humble due to a personal and family history of cancer.  [name] presents to clinic today to discuss the possibility of a hereditary predisposition to cancer, genetic testing, and to further clarify her future cancer risks, as well as potential cancer risks for family members.   In 2012, at the age of 38, [name] was diagnosed with invasive ductal carcinoma of the left breast.  The tumor was triple negative. This was treated with radical mastectomy, chemotherapy and radiation.  Genetic testing was performed at that time and was negative.      CANCER HISTORY:    Cancer of left breast ([place])   01/19/2011 Initial Diagnosis    Cancer of left breast      01/27/2011 Procedure    Genetic testing was negative for BRCA1 and BRCA2 gene mutations      02/21/2011 Surgery    Left modified radical mastectomy with axillary lymph node dissection T2 N2 M0 ER 0%, PR 0%, HER-2 negative ratio 1.41, multifocal grade 3 largest 2.4 cm other foci 1.2, 1, 1, 0.2 cm with high-grade DCIS, and VI, 7/14 lymph nodes positive      03/22/2011 - 07/17/2011 Chemotherapy    NSABP B 49 clinical trial randomized to 6 cycles of Taxotere and Cytoxan      08/29/2011 - 10/10/2011 Radiation Therapy    Adjuvant radiation therapy      02/12/2014 Imaging    CT chest abdomen pelvis and bone scan were normal the test was done to evaluate elevated alkaline phosphatase        HORMONAL RISK FACTORS:  Menarche was at age 29.  First live  birth at age 32.  OCP use for approximately <5 years.  Ovaries intact: yes.  Hysterectomy: yes.  Menopausal status: perimenopausal.  HRT use: 0 years. Colonoscopy: no; not examined. Mammogram within the last year: yes. Number of breast biopsies: 2. Up to date with pelvic exams:  yes. Any excessive radiation exposure in the past:  no  Past Medical History:  Diagnosis Date  . Allergy   . Blood clot in vein    nonocclusive clot right subclavian vein, on Xarelto for 6 months  . Breast cancer ([place])    T2N2 tiple negative left breast ca   . Breast cancer ([place])   . Breast mass in female    fibrocystic changes  . Cataracts, bilateral   . Diabetes mellitus    insulin dependant - uses pump, Dr. Dwyane Dee  . Family history of breast cancer   . Family history of prostate cancer   . History of chemotherapy    taxotere/cytoxan with neulasta support  . Hyperlipidemia   . Hypertension   . Lymphedema of arm    left upper extremity  . Nausea   . Nausea & vomiting    for approx. 2 weeks  . Radiation 08/21/11-10/05/11   Left chestwll/Supraclav./left PAB/left scar  . S/P chemotherapy, time since 4-12 weeks     Past Surgical History:  Procedure  Laterality Date  . ABDOMINAL HYSTERECTOMY  2002   still has ovaries   . bladder tacking  01/2009  . left mastectomy  02/21/2011  . MASS EXCISION  02/19/2012   Procedure: MINOR EXCISION OF MASS;  Surgeon: Adin Hector, MD;  Location: [facility];  Service: General;  Laterality: Left;  Marland Kitchen MASTECTOMY MODIFIED RADICAL  02/21/11   left,invasive  multifocal grade III ductal ca,high grade dcis,lymph/vascular invasion invasion (7/14) nodes positive mets  . PORT-A-CATH REMOVAL  10/13/2011   Procedure: MINOR REMOVAL PORT-A-CATH;  Surgeon: Adin Hector, MD;  Location: [facility];  Service: General;  Laterality: Right;  PORT-A CATH REMOVAL  . PORTACATH PLACEMENT  02/21/2011   Procedure: INSERTION PORT-A-CATH;  Surgeon: Adin Hector, MD;  Location: [facility];  Service: General;  Laterality: N/A;  . SHOULDER SURGERY  04/26/10   left  . TUBAL LIGATION  12/1999    Social History   Socioeconomic History  . Marital status: Married    Spouse name: Not on file  . Number of children: Not on file  . Years of education: Not on file  . Highest education level: Not on file  Social Needs  . Financial resource strain: Not on file  . Food insecurity - worry: Not on file  . Food insecurity - inability: Not on file  . Transportation needs - medical: Not on file  . Transportation needs - non-medical: Not on file  Occupational History  . Not on file  Tobacco Use  . Smoking status: Never Smoker  . Smokeless tobacco: Never Used  Substance and Sexual Activity  . Alcohol use: No  . Drug use: No  . Sexual activity: Yes    Comment: menses age 39, 24st preganancy age 29, G63p3  Other Topics Concern  . Not on file  Social History Narrative  . Not on file     FAMILY HISTORY:  We obtained a detailed, 4-generation family history.  Significant diagnoses are listed below: Family History  Problem Relation Age of Onset  . Cancer Maternal Aunt        breast  . Cancer Maternal Uncle 50        right eye removed s/p cancer  . Hypertension Unknown   . Diabetes Unknown   . Deep vein thrombosis Unknown   . Kidney disease Unknown   . Cancer Maternal Grandfather        prostate ca    The patient has three children, two boys and a girl.  All are cancer free.  She has two full siblings, a brother and a sister, a maternal half brother, and four paternal half siblings - two brothers and two sisters. All are cancer free.  Both parents are living.  The patient's father is 30.  He had one brother who died in a car accident at 13.  Both paternal grandparents are deceased from unknown causes.   The patient's mother is alive at 10.  She had 18 full siblings. One brother had an ocular cancer and a sister had breast cancer.  Two cousins each  had a child with an unknown form of cancer. The maternal grandfather had prostate cancer.  [name] is unaware of previous family history of genetic testing for hereditary cancer risks. Patient's maternal ancestors are of [place] and [place] descent, and paternal ancestors are of [place] American descent. There is no reported Ashkenazi Jewish ancestry. There is no known consanguinity.  GENETIC COUNSELING ASSESSMENT: Terri Vida  [name] is a 47 y.o. female with a personal and family history of breat cancer and family history of other cancers which is somewhat suggestive of a hereditary cancer syndrome and predisposition to cancer. We, therefore, discussed and recommended the following at today's visit.   DISCUSSION: We discussed that about 5-10% of breast cancer is hereditary with most cases due to BRCA mutations.  There are other genes that are associated with hereditary breast cancers.  These include ATM, CHEK2 and PALB2.  We reviewed the characteristics, features and inheritance patterns of hereditary cancer syndromes. We also discussed genetic testing, including the appropriate family members to test, the process of testing, insurance coverage and turn-around-time for results. We discussed the implications of a negative, positive and/or variant of uncertain significant result. We recommended [name] pursue genetic testing for the Multi-cancer gene panel. The Multi-Gene Panel offered by [facility] includes sequencing and/or deletion duplication testing of the following 83 genes: ALK, APC, ATM, AXIN2,BAP1,  BARD1, BLM, BMPR1A, BRCA1, BRCA2, BRIP1, CASR, CDC73, CDH1, CDK4, CDKN1B, CDKN1C, CDKN2A (p14ARF), CDKN2A (p16INK4a), CEBPA, CHEK2, CTNNA1, DICER1, DIS3L2, EGFR (c.2369C>T, p.Thr790Met variant only), EPCAM (Deletion/duplication testing only), FH, FLCN, GATA2, GPC3, GREM1 (Promoter region deletion/duplication testing only), HOXB13 (c.251G>A, p.Gly84Glu), HRAS, KIT, MAX, MEN1, MET, MITF (c.952G>A,  p.Glu318Lys variant only), MLH1, MSH2, MSH3, MSH6, MUTYH, NBN, NF1, NF2, NTHL1, PALB2, PDGFRA, PHOX2B, PMS2, POLD1, POLE, POT1, PRKAR1A, PTCH1, PTEN, RAD50, RAD51C, RAD51D, RB1, RECQL4, RET, RUNX1, SDHAF2, SDHA (sequence changes only), SDHB, SDHC, SDHD, SMAD4, SMARCA4, SMARCB1, SMARCE1, STK11, SUFU, TERT, TERT, TMEM127, TP53, TSC1, TSC2, VHL, WRN and WT1.    Based on [name]'s personal and family history of cancer, she meets medical criteria for genetic testing. Despite that she meets criteria, she may still have an out of pocket cost. We discussed that if her out of pocket cost for testing is over $100, the laboratory will call and confirm whether she wants to proceed with testing.  If the out of pocket cost of testing is less than $100 she will be billed by the genetic testing laboratory.   PLAN: After considering the risks, benefits, and limitations, [name]  provided informed consent to pursue genetic testing and the blood sample was sent to [facility] for analysis of the Multi-cancer panel. Results should be available within approximately 2-3 weeks' time, at which point they will be disclosed by telephone to [name], as will any additional recommendations warranted by these results. [name] will receive a summary of her genetic counseling visit and a copy of her results once available. This information will also be available in Epic. We encouraged [name] to remain in contact with cancer genetics annually so that we can continuously update the family history and inform her of any changes in cancer genetics and testing that may be of benefit for her family. [name] questions were answered to her satisfaction today. Our contact information was provided should additional questions or concerns arise.  Lastly, we encouraged [name] to remain in contact with cancer genetics annually so that we can continuously update the family history and inform her of any changes in  cancer genetics and testing that may be of benefit for this family.   Ms.  [name] questions were answered to her satisfaction today. Our contact information was provided should additional questions or concerns arise. Thank you for the referral and allowing [place] to share in the care of your patient.   Karen P. Florene Glen, [place], [facility] Certified Genetic Counselor [email or web address] phone: [phone]  The patient was seen for a total of 45 minutes in face-to-face genetic counseling.  This patient was discussed with Drs. Magrinat, Lindi Adie and/or Burr Medico who agrees with the above.    _______________________________________________________________________ For Office Staff:  Number of people involved in session: 1 Was an Intern/ student involved with case: no

## 2017-02-09 DIAGNOSIS — E1065 Type 1 diabetes mellitus with hyperglycemia: Secondary | ICD-10-CM | POA: Diagnosis not present

## 2017-02-09 DIAGNOSIS — Z794 Long term (current) use of insulin: Secondary | ICD-10-CM | POA: Diagnosis not present

## 2017-02-19 ENCOUNTER — Ambulatory Visit: Payer: Commercial Managed Care - PPO

## 2017-02-27 ENCOUNTER — Encounter: Payer: Self-pay | Admitting: Genetic Counselor

## 2017-02-27 ENCOUNTER — Ambulatory Visit: Payer: Self-pay | Admitting: Genetic Counselor

## 2017-02-27 ENCOUNTER — Telehealth: Payer: Self-pay | Admitting: Genetic Counselor

## 2017-02-27 DIAGNOSIS — C50412 Malignant neoplasm of upper-outer quadrant of left female breast: Secondary | ICD-10-CM

## 2017-02-27 DIAGNOSIS — Z1379 Encounter for other screening for genetic and chromosomal anomalies: Secondary | ICD-10-CM | POA: Insufficient documentation

## 2017-02-27 DIAGNOSIS — Z171 Estrogen receptor negative status [ER-]: Secondary | ICD-10-CM

## 2017-02-27 DIAGNOSIS — Z803 Family history of malignant neoplasm of breast: Secondary | ICD-10-CM

## 2017-02-27 DIAGNOSIS — Z8042 Family history of malignant neoplasm of prostate: Secondary | ICD-10-CM

## 2017-02-27 NOTE — Telephone Encounter (Signed)
Revealed negative genetic testing.  Discussed that we do not know why she has breast cancer or why there is cancer in the family. It could be due to a different gene that we are not testing, or maybe our current technology may not be able to pick something up.  It will be important for her to keep in contact with genetics to keep up with whether additional testing may be needed.   Revealed that there were three VUSs identified.  Discussed that we will not change medical management based on these results.

## 2017-02-27 NOTE — Progress Notes (Addendum)
HPI:  Ms. Pandolfi was previously seen in the [facility] due to a personal and family history of cancer and concerns regarding a hereditary predisposition to cancer. Please refer to our prior cancer genetics clinic note for more information regarding Ms. Stucki's medical, social and family histories, and our assessment and recommendations, at the time. Ms. Nawrot recent genetic test results were disclosed to her, as were recommendations warranted by these results. These results and recommendations are discussed in more detail below.  CANCER HISTORY:    Cancer of left breast ([place])   01/19/2011 Initial Diagnosis    Cancer of left breast      01/27/2011 Procedure    Genetic testing was negative for BRCA1 and BRCA2 gene mutations      02/21/2011 Surgery    Left modified radical mastectomy with axillary lymph node dissection T2 N2 M0 ER 0%, PR 0%, HER-2 negative ratio 1.41, multifocal grade 3 largest 2.4 cm other foci 1.2, 1, 1, 0.2 cm with high-grade DCIS, and VI, 7/14 lymph nodes positive      03/22/2011 - 07/17/2011 Chemotherapy    NSABP B 49 clinical trial randomized to 6 cycles of Taxotere and Cytoxan      08/29/2011 - 10/10/2011 Radiation Therapy    Adjuvant radiation therapy      02/12/2014 Imaging    CT chest abdomen pelvis and bone scan were normal the test was done to evaluate elevated alkaline phosphatase      02/25/2017 Genetic Testing    NBN c.1343Z>T (p.Gln448Leu), RECQL4 c.25G>A (p.Glu9Lys) and RET c.1008C>G (p.Asn336Lys) VUS found on the Multi-cancer panel.  The [facility] gene panel offered by [employer] includes sequencing and deletion/duplication testing of the following 28 genes: APC, ATM, BARD1, BMPR1A, BRCA1, BRCA2, BRIP1, CHD1, CDK4, CDKN2A, CHEK2, EPCAM (large rearrangement only), MLH1, MSH2, MSH6, MUTYH, NBN, PALB2, PMS2, PTEN, RAD51C, RAD51D, SMAD4, STK11, and TP53. Sequencing was performed for select regions of POLE and POLD1, and  large rearrangement analysis was performed for select regions of GREM1. The report date is February 25, 2017.        FAMILY HISTORY:  We obtained a detailed, 4-generation family history.  Significant diagnoses are listed below: Family History  Problem Relation Age of Onset   Cancer Maternal Aunt        breast   Cancer Maternal Uncle 21        right eye removed s/p cancer   Hypertension Unknown    Diabetes Unknown    Deep vein thrombosis Unknown    Kidney disease Unknown    Cancer Maternal Grandfather        prostate ca    The patient has three children, two boys and a girl.  All are cancer free.  She has two full siblings, a brother and a sister, a maternal half brother, and four paternal half siblings - two brothers and two sisters. All are cancer free.  Both parents are living.   The patient's father is 28.  He had one brother who died in a car accident at 73.  Both paternal grandparents are deceased from unknown causes.    The patient's mother is alive at 72.  She had 18 full siblings. One brother had an ocular cancer and a sister had breast cancer.  Two cousins each had a child with an unknown form of cancer. The maternal grandfather had prostate cancer.   Ms. Creegan is unaware of previous family history of genetic testing for hereditary cancer risks.  Patient's maternal ancestors are of [place] and [place] descent, and paternal ancestors are of [place] American descent. There is no reported Ashkenazi Jewish ancestry. There is no known consanguinity.  GENETIC TEST RESULTS: Genetic testing reported out on February 25, 2017 through the Multi- cancer panel found no deleterious mutations.  The Multi-Gene Panel offered by [employer] includes sequencing and/or deletion duplication testing of the following 83 genes: ALK, APC, ATM, AXIN2,BAP1,  BARD1, BLM, BMPR1A, BRCA1, BRCA2, BRIP1, CASR, CDC73, CDH1, CDK4, CDKN1B, CDKN1C, CDKN2A (p14ARF), CDKN2A (p16INK4a), CEBPA, CHEK2, CTNNA1,  DICER1, DIS3L2, EGFR (c.2369C>T, p.Thr790Met variant only), EPCAM (Deletion/duplication testing only), FH, FLCN, GATA2, GPC3, GREM1 (Promoter region deletion/duplication testing only), HOXB13 (c.251G>A, p.Gly84Glu), HRAS, KIT, MAX, MEN1, MET, MITF (c.952G>A, p.Glu318Lys variant only), MLH1, MSH2, MSH3, MSH6, MUTYH, NBN, NF1, NF2, NTHL1, PALB2, PDGFRA, PHOX2B, PMS2, POLD1, POLE, POT1, PRKAR1A, PTCH1, PTEN, RAD50, RAD51C, RAD51D, RB1, RECQL4, RET, RUNX1, SDHAF2, SDHA (sequence changes only), SDHB, SDHC, SDHD, SMAD4, SMARCA4, SMARCB1, SMARCE1, STK11, SUFU, TERT, TERT, TMEM127, TP53, TSC1, TSC2, VHL, WRN and WT1.  The test report has been scanned into [employer] and is located under the Molecular Pathology section of the Results Review tab.    We discussed with Ms. Verdell that since the current genetic testing is not perfect, it is possible there may be a gene mutation in one of these genes that current testing cannot detect, but that chance is small.  We also discussed, that it is possible that another gene that has not yet been discovered, or that we have not yet tested, is responsible for the cancer diagnoses in the family, and it is, therefore, important to remain in touch with cancer genetics in the future so that we can continue to offer Ms. Pinkham the most up to date genetic testing.   Genetic testing did detect three Variants of Unknown Significance - one in the NBN gene called c.1343A>G (p.Gln448Leu), another in RECQL4 gene called c.25G>A (p.Glu9Lys) and a third RET gene called c.1008C>G (p.Asn336Lys).  At this time, it is unknown if these variants is associated with an increased cancer risk or if they are a normal finding, but most variants such as these get reclassified to being inconsequential. It should not be used to make medical management decisions. With time, we suspect the lab will determine the significance of this variant, if any. If we do learn more about it, we will try to contact Ms. Pottenger to  discuss it further. However, it is important to stay in touch with [place] periodically and keep the address and phone number up to date.  UPDATE: NBN c.1343A>T (p.Gln448Leu) VUS has been reclassified as Likely Benign.  The amended report date is October 11, 2020.    CANCER SCREENING RECOMMENDATIONS: This result is reassuring and indicates that Ms. Ruedas likely does not have an increased risk for a future cancer due to a mutation in one of these genes. This normal test also suggests that Ms. Gaugh's cancer was most likely not due to an inherited predisposition associated with one of these genes.  Most cancers happen by chance and this negative test suggests that her cancer falls into this category.  We, therefore, recommended she continue to follow the cancer management and screening guidelines provided by her oncology and primary healthcare provider.   RECOMMENDATIONS FOR FAMILY MEMBERS:  Women in this family might be at some increased risk of developing cancer, over the general population risk, simply due to the family history of cancer.  We recommended women in this family  have a yearly mammogram beginning at age 22, or 85 years younger than the earliest onset of cancer, an annual clinical breast exam, and perform monthly breast self-exams. Women in this family should also have a gynecological exam as recommended by their primary provider. All family members should have a colonoscopy by age 68.  FOLLOW-UP: Lastly, we discussed with Ms. Wojciak that cancer genetics is a rapidly advancing field and it is possible that new genetic tests will be appropriate for her and/or her family members in the future. We encouraged her to remain in contact with cancer genetics on an annual basis so we can update her personal and family histories and let her know of advances in cancer genetics that may benefit this family.   Our contact number was provided. Ms. Gentz questions were answered to her satisfaction,  and she knows she is welcome to call us at anytime with additional questions or concerns.   Roma Kayser, MS, [facility] Certified Genetic Counselor [email or web address]

## 2017-02-27 NOTE — Telephone Encounter (Signed)
LM on VM with good news.  Asked that she CB. 

## 2017-03-07 ENCOUNTER — Ambulatory Visit
Admission: RE | Admit: 2017-03-07 | Discharge: 2017-03-07 | Disposition: A | Discharge status: Home / Self Care | Payer: Commercial Managed Care - PPO | Source: Ambulatory Visit | Attending: Adult Health | Admitting: Adult Health

## 2017-03-07 ENCOUNTER — Other Ambulatory Visit: Payer: Self-pay | Admitting: Adult Health

## 2017-03-07 DIAGNOSIS — Z853 Personal history of malignant neoplasm of breast: Secondary | ICD-10-CM

## 2017-03-07 DIAGNOSIS — Z1231 Encounter for screening mammogram for malignant neoplasm of breast: Secondary | ICD-10-CM | POA: Diagnosis not present

## 2017-03-15 DIAGNOSIS — E7800 Pure hypercholesterolemia, unspecified: Secondary | ICD-10-CM | POA: Diagnosis not present

## 2017-03-15 DIAGNOSIS — Z794 Long term (current) use of insulin: Secondary | ICD-10-CM | POA: Diagnosis not present

## 2017-03-15 DIAGNOSIS — I10 Essential (primary) hypertension: Secondary | ICD-10-CM | POA: Diagnosis not present

## 2017-03-15 DIAGNOSIS — E1042 Type 1 diabetes mellitus with diabetic polyneuropathy: Secondary | ICD-10-CM | POA: Diagnosis not present

## 2017-03-15 DIAGNOSIS — N951 Menopausal and female climacteric states: Secondary | ICD-10-CM | POA: Diagnosis not present

## 2017-03-15 DIAGNOSIS — M1990 Unspecified osteoarthritis, unspecified site: Secondary | ICD-10-CM | POA: Diagnosis not present

## 2017-03-15 DIAGNOSIS — F322 Major depressive disorder, single episode, severe without psychotic features: Secondary | ICD-10-CM | POA: Diagnosis not present

## 2017-03-21 DIAGNOSIS — H524 Presbyopia: Secondary | ICD-10-CM | POA: Diagnosis not present

## 2017-03-21 DIAGNOSIS — H5213 Myopia, bilateral: Secondary | ICD-10-CM | POA: Diagnosis not present

## 2017-03-21 DIAGNOSIS — E103393 Type 1 diabetes mellitus with moderate nonproliferative diabetic retinopathy without macular edema, bilateral: Secondary | ICD-10-CM | POA: Diagnosis not present

## 2017-03-21 DIAGNOSIS — H52223 Regular astigmatism, bilateral: Secondary | ICD-10-CM | POA: Diagnosis not present

## 2017-03-21 DIAGNOSIS — H25813 Combined forms of age-related cataract, bilateral: Secondary | ICD-10-CM | POA: Diagnosis not present

## 2017-04-25 DIAGNOSIS — E109 Type 1 diabetes mellitus without complications: Secondary | ICD-10-CM | POA: Diagnosis not present

## 2017-05-04 DIAGNOSIS — E109 Type 1 diabetes mellitus without complications: Secondary | ICD-10-CM | POA: Diagnosis not present

## 2017-05-04 DIAGNOSIS — E1065 Type 1 diabetes mellitus with hyperglycemia: Secondary | ICD-10-CM | POA: Diagnosis not present

## 2017-05-04 DIAGNOSIS — Z794 Long term (current) use of insulin: Secondary | ICD-10-CM | POA: Diagnosis not present

## 2017-05-04 DIAGNOSIS — C50919 Malignant neoplasm of unspecified site of unspecified female breast: Secondary | ICD-10-CM | POA: Diagnosis not present

## 2017-05-16 DIAGNOSIS — R05 Cough: Secondary | ICD-10-CM | POA: Diagnosis not present

## 2017-05-16 DIAGNOSIS — J069 Acute upper respiratory infection, unspecified: Secondary | ICD-10-CM | POA: Diagnosis not present

## 2017-05-21 DIAGNOSIS — C50919 Malignant neoplasm of unspecified site of unspecified female breast: Secondary | ICD-10-CM | POA: Diagnosis not present

## 2017-05-21 DIAGNOSIS — Z794 Long term (current) use of insulin: Secondary | ICD-10-CM | POA: Diagnosis not present

## 2017-05-21 DIAGNOSIS — E109 Type 1 diabetes mellitus without complications: Secondary | ICD-10-CM | POA: Diagnosis not present

## 2017-05-21 DIAGNOSIS — E1065 Type 1 diabetes mellitus with hyperglycemia: Secondary | ICD-10-CM | POA: Diagnosis not present

## 2017-05-24 DIAGNOSIS — R110 Nausea: Secondary | ICD-10-CM | POA: Diagnosis not present

## 2017-05-24 DIAGNOSIS — Z794 Long term (current) use of insulin: Secondary | ICD-10-CM | POA: Diagnosis not present

## 2017-05-24 DIAGNOSIS — R1110 Vomiting, unspecified: Secondary | ICD-10-CM | POA: Diagnosis not present

## 2017-05-24 DIAGNOSIS — E109 Type 1 diabetes mellitus without complications: Secondary | ICD-10-CM | POA: Diagnosis not present

## 2017-07-06 DIAGNOSIS — Z794 Long term (current) use of insulin: Secondary | ICD-10-CM | POA: Diagnosis not present

## 2017-07-06 DIAGNOSIS — E065 Other chronic thyroiditis: Secondary | ICD-10-CM | POA: Diagnosis not present

## 2017-07-06 DIAGNOSIS — E1065 Type 1 diabetes mellitus with hyperglycemia: Secondary | ICD-10-CM | POA: Diagnosis not present

## 2017-07-20 DIAGNOSIS — E109 Type 1 diabetes mellitus without complications: Secondary | ICD-10-CM | POA: Diagnosis not present

## 2017-07-20 DIAGNOSIS — E1065 Type 1 diabetes mellitus with hyperglycemia: Secondary | ICD-10-CM | POA: Diagnosis not present

## 2017-07-20 DIAGNOSIS — Z794 Long term (current) use of insulin: Secondary | ICD-10-CM | POA: Diagnosis not present

## 2017-08-20 DIAGNOSIS — E109 Type 1 diabetes mellitus without complications: Secondary | ICD-10-CM | POA: Diagnosis not present

## 2017-08-20 DIAGNOSIS — Z794 Long term (current) use of insulin: Secondary | ICD-10-CM | POA: Diagnosis not present

## 2017-08-20 DIAGNOSIS — E1065 Type 1 diabetes mellitus with hyperglycemia: Secondary | ICD-10-CM | POA: Diagnosis not present

## 2017-08-30 DIAGNOSIS — M2241 Chondromalacia patellae, right knee: Secondary | ICD-10-CM | POA: Diagnosis not present

## 2017-08-30 DIAGNOSIS — M79672 Pain in left foot: Secondary | ICD-10-CM | POA: Diagnosis not present

## 2017-08-30 DIAGNOSIS — M25561 Pain in right knee: Secondary | ICD-10-CM | POA: Diagnosis not present

## 2017-09-10 ENCOUNTER — Encounter: Payer: Self-pay | Admitting: Podiatry

## 2017-09-10 ENCOUNTER — Ambulatory Visit (INDEPENDENT_AMBULATORY_CARE_PROVIDER_SITE_OTHER): Payer: Commercial Managed Care - PPO | Admitting: Podiatry

## 2017-09-10 VITALS — BP 114/77 | HR 77 | Resp 16

## 2017-09-10 DIAGNOSIS — Q828 Other specified congenital malformations of skin: Secondary | ICD-10-CM

## 2017-09-10 DIAGNOSIS — E1140 Type 2 diabetes mellitus with diabetic neuropathy, unspecified: Secondary | ICD-10-CM | POA: Diagnosis not present

## 2017-09-10 DIAGNOSIS — E1149 Type 2 diabetes mellitus with other diabetic neurological complication: Secondary | ICD-10-CM | POA: Diagnosis not present

## 2017-09-10 DIAGNOSIS — M216X9 Other acquired deformities of unspecified foot: Secondary | ICD-10-CM | POA: Diagnosis not present

## 2017-09-10 NOTE — Progress Notes (Signed)
   Subjective:    Patient ID: [name], female    DOB: October 02, 1970, 47 y.o.   MRN: [id number]  HPI    Review of Systems  All other systems reviewed and are negative.      Objective:   Physical Exam        Assessment & Plan:

## 2017-09-12 NOTE — Progress Notes (Signed)
Subjective:   Patient ID: [name], female   DOB: 47 y.o.   MRN: [id number]   HPI Patient presents with long-term diabetes with A1c's that is been relatively out of control with last A1c being 9.2 which is an improvement.  Has lesions on both feet with the one on the outside of the left foot being quite tender and states that she cannot take care of them herself and she does have tingling and burning in her feet.  Patient does not smoke and likes to be active   Review of Systems  All other systems reviewed and are negative.       Objective:  Physical Exam  Constitutional: She appears well-developed and well-nourished.  Cardiovascular: Intact distal pulses.  Pulmonary/Chest: Effort normal.  Musculoskeletal: Normal range of motion.  Neurological: She is alert.  Skin: Skin is warm.  Nursing note and vitals reviewed.   I did note vascular status to be intact with mild diminishment of sharp dull and vibratory bilateral.  Patient is found to have multiple lesions plantar aspect left and right foot with the one on the outside left foot being very tender with palpation and also lesion formation sub-second metatarsal left and on the right second metatarsal.  Patient has good digital perfusion well oriented x3     Assessment:  Plantar keratotic lesion bilateral with porokeratotic type core and at risk diabetic condition     Plan:  H&P conditions reviewed diabetic education rendered.  Today using sharp sterile debridement I did debride all lesions with no iatrogenic bleeding and instructed on routine care

## 2017-10-08 DIAGNOSIS — E1065 Type 1 diabetes mellitus with hyperglycemia: Secondary | ICD-10-CM | POA: Diagnosis not present

## 2017-10-08 DIAGNOSIS — Z794 Long term (current) use of insulin: Secondary | ICD-10-CM | POA: Diagnosis not present

## 2017-10-08 DIAGNOSIS — E065 Other chronic thyroiditis: Secondary | ICD-10-CM | POA: Diagnosis not present

## 2017-10-29 ENCOUNTER — Telehealth: Payer: Self-pay | Admitting: Nutrition

## 2017-10-29 DIAGNOSIS — N951 Menopausal and female climacteric states: Secondary | ICD-10-CM | POA: Diagnosis not present

## 2017-10-29 DIAGNOSIS — E1042 Type 1 diabetes mellitus with diabetic polyneuropathy: Secondary | ICD-10-CM | POA: Diagnosis not present

## 2017-10-29 DIAGNOSIS — E133299 Other specified diabetes mellitus with mild nonproliferative diabetic retinopathy without macular edema, unspecified eye: Secondary | ICD-10-CM | POA: Diagnosis not present

## 2017-10-29 DIAGNOSIS — G44209 Tension-type headache, unspecified, not intractable: Secondary | ICD-10-CM | POA: Diagnosis not present

## 2017-10-29 DIAGNOSIS — Z23 Encounter for immunization: Secondary | ICD-10-CM | POA: Diagnosis not present

## 2017-10-29 DIAGNOSIS — E7800 Pure hypercholesterolemia, unspecified: Secondary | ICD-10-CM | POA: Diagnosis not present

## 2017-10-29 DIAGNOSIS — I10 Essential (primary) hypertension: Secondary | ICD-10-CM | POA: Diagnosis not present

## 2017-10-29 DIAGNOSIS — F325 Major depressive disorder, single episode, in full remission: Secondary | ICD-10-CM | POA: Diagnosis not present

## 2017-10-29 DIAGNOSIS — E1139 Type 2 diabetes mellitus with other diabetic ophthalmic complication: Secondary | ICD-10-CM | POA: Diagnosis not present

## 2017-10-29 DIAGNOSIS — M1990 Unspecified osteoarthritis, unspecified site: Secondary | ICD-10-CM | POA: Diagnosis not present

## 2017-10-29 NOTE — Telephone Encounter (Signed)
Please call pt Ph # [phone]

## 2017-10-30 NOTE — Telephone Encounter (Signed)
Message left on machine to call me. 

## 2017-11-22 DIAGNOSIS — E1065 Type 1 diabetes mellitus with hyperglycemia: Secondary | ICD-10-CM | POA: Diagnosis not present

## 2017-11-22 DIAGNOSIS — C50919 Malignant neoplasm of unspecified site of unspecified female breast: Secondary | ICD-10-CM | POA: Diagnosis not present

## 2017-11-22 DIAGNOSIS — E109 Type 1 diabetes mellitus without complications: Secondary | ICD-10-CM | POA: Diagnosis not present

## 2017-11-22 DIAGNOSIS — Z794 Long term (current) use of insulin: Secondary | ICD-10-CM | POA: Diagnosis not present

## 2017-12-11 DIAGNOSIS — F411 Generalized anxiety disorder: Secondary | ICD-10-CM | POA: Diagnosis not present

## 2017-12-11 DIAGNOSIS — F332 Major depressive disorder, recurrent severe without psychotic features: Secondary | ICD-10-CM | POA: Diagnosis not present

## 2017-12-24 ENCOUNTER — Inpatient Hospital Stay: Payer: Commercial Managed Care - PPO | Attending: Adult Health | Admitting: Adult Health

## 2017-12-24 ENCOUNTER — Encounter: Payer: Self-pay | Admitting: Adult Health

## 2017-12-24 VITALS — BP 149/86 | HR 80 | Temp 98.7°F | Resp 18 | Ht 65.0 in | Wt 207.0 lb

## 2017-12-24 DIAGNOSIS — I10 Essential (primary) hypertension: Secondary | ICD-10-CM | POA: Diagnosis not present

## 2017-12-24 DIAGNOSIS — Z853 Personal history of malignant neoplasm of breast: Secondary | ICD-10-CM | POA: Insufficient documentation

## 2017-12-24 DIAGNOSIS — M5441 Lumbago with sciatica, right side: Secondary | ICD-10-CM | POA: Insufficient documentation

## 2017-12-24 DIAGNOSIS — E119 Type 2 diabetes mellitus without complications: Secondary | ICD-10-CM

## 2017-12-24 DIAGNOSIS — C50412 Malignant neoplasm of upper-outer quadrant of left female breast: Secondary | ICD-10-CM

## 2017-12-24 DIAGNOSIS — G8929 Other chronic pain: Secondary | ICD-10-CM

## 2017-12-24 DIAGNOSIS — Z171 Estrogen receptor negative status [ER-]: Secondary | ICD-10-CM

## 2017-12-24 NOTE — Progress Notes (Signed)
CLINIC:  Survivorship   REASON FOR VISIT:  Routine follow-up for history of breast cancer.   BRIEF ONCOLOGIC HISTORY:    Cancer of left breast ([place])   01/19/2011 Initial Diagnosis    Cancer of left breast    01/27/2011 Procedure    Genetic testing was negative for BRCA1 and BRCA2 gene mutations    02/21/2011 Surgery    Left modified radical mastectomy with axillary lymph node dissection T2 N2 M0 ER 0%, PR 0%, HER-2 negative ratio 1.41, multifocal grade 3 largest 2.4 cm other foci 1.2, 1, 1, 0.2 cm with high-grade DCIS, and VI, 7/14 lymph nodes positive    03/22/2011 - 07/17/2011 Chemotherapy    NSABP B 49 clinical trial randomized to 6 cycles of Taxotere and Cytoxan    08/29/2011 - 10/10/2011 Radiation Therapy    Adjuvant radiation therapy    02/12/2014 Imaging    CT chest abdomen pelvis and bone scan were normal the test was done to evaluate elevated alkaline phosphatase    02/25/2017 Genetic Testing    NBN c.1343Z>T (p.Gln448Leu), RECQL4 c.25G>A (p.Glu9Lys) and RET c.1008C>G (p.Asn336Lys) VUS found on the Multi-cancer panel.  The [facility] gene panel offered by [employer] includes sequencing and deletion/duplication testing of the following 28 genes: APC, ATM, BARD1, BMPR1A, BRCA1, BRCA2, BRIP1, CHD1, CDK4, CDKN2A, CHEK2, EPCAM (large rearrangement only), MLH1, MSH2, MSH6, MUTYH, NBN, PALB2, PMS2, PTEN, RAD51C, RAD51D, SMAD4, STK11, and TP53. Sequencing was performed for select regions of POLE and POLD1, and large rearrangement analysis was performed for select regions of GREM1. The report date is February 25, 2017.       INTERVAL HISTORY:  Ms. Belanger presents to the Survivorship Clinic today for routine follow-up for her history of breast cancer.  She is doing well today.  She has no new medical issues this year.  She sees her PCP regularly.  She is not exercising.  She doesn't require pap smears due to TAH.   Katelyn continue to have pain in her lower back that  radiates down the right leg.  She notes this pain ahs been going on since last year.  She takes Advil for this.  Xrays were neg of her lumbar spine last year.  She has not yet followed up with her PCP about it.  She has meloxicam on her med list but hasn't been taking it for her pain.      REVIEW OF SYSTEMS:  Review of Systems  Constitutional: Negative for appetite change, chills, fatigue, fever and unexpected weight change.  HENT:   Negative for hearing loss and lump/mass.   Eyes: Negative for eye problems and icterus.  Respiratory: Negative for chest tightness, cough and shortness of breath.   Cardiovascular: Negative for chest pain, leg swelling and palpitations.  Gastrointestinal: Negative for abdominal distention, abdominal pain, constipation, nausea and vomiting.  Endocrine: Negative for hot flashes.  Genitourinary: Negative for difficulty urinating.   Musculoskeletal: Positive for back pain.  Skin: Negative for itching and rash.  Neurological: Negative for dizziness, extremity weakness and numbness.  Psychiatric/Behavioral: Negative for depression. The patient is not nervous/anxious.     Breast: Denies any new nodularity, masses, tenderness, nipple changes, or nipple discharge.       PAST MEDICAL/SURGICAL HISTORY:  Past Medical History:  Diagnosis Date  . Allergy   . Blood clot in vein    nonocclusive clot right subclavian vein, on Xarelto for 6 months  . Breast cancer ([place])    T2N2 tiple negative left  breast ca   . Breast cancer ([place])   . Breast mass in female    fibrocystic changes  . Cataracts, bilateral   . Diabetes mellitus    insulin dependant - uses pump, Dr. Dwyane Dee  . Family history of breast cancer   . Family history of prostate cancer   . History of chemotherapy    taxotere/cytoxan with neulasta support  . Hyperlipidemia   . Hypertension   . Lymphedema of arm    left upper extremity  . Nausea   . Nausea & vomiting    for approx. 2 weeks  . Radiation  08/21/11-10/05/11   Left chestwll/Supraclav./left PAB/left scar  . S/P chemotherapy, time since 4-12 weeks    Past Surgical History:  Procedure Laterality Date  . ABDOMINAL HYSTERECTOMY  2002   still has ovaries   . bladder tacking  01/2009  . left mastectomy  02/21/2011  . MASS EXCISION  02/19/2012   Procedure: MINOR EXCISION OF MASS;  Surgeon: Adin Hector, MD;  Location: [facility];  Service: General;  Laterality: Left;  Marland Kitchen MASTECTOMY MODIFIED RADICAL  02/21/11   left,invasive  multifocal grade III ductal ca,high grade dcis,lymph/vascular invasion invasion (7/14) nodes positive mets  . PORT-A-CATH REMOVAL  10/13/2011   Procedure: MINOR REMOVAL PORT-A-CATH;  Surgeon: Adin Hector, MD;  Location: [facility];  Service: General;  Laterality: Right;  PORT-A CATH REMOVAL  . PORTACATH PLACEMENT  02/21/2011   Procedure: INSERTION PORT-A-CATH;  Surgeon: Adin Hector, MD;  Location: [facility];  Service: General;  Laterality: N/A;  . SHOULDER SURGERY  04/26/10   left  . TUBAL LIGATION  12/1999     ALLERGIES:  Allergies  Allergen Reactions  . Hydrocodone-Acetaminophen Itching and Swelling  . Insulin Glargine Itching, Nausea And Vomiting and Swelling  . Nph Iletin I [Insulin Isophane, Mixed Nph] Itching, Nausea And Vomiting and Swelling  . Lantus [Insulin Glargine] Nausea And Vomiting and Swelling     CURRENT MEDICATIONS:  Outpatient Encounter Medications as of 12/24/2017  Medication Sig Note  . atorvastatin (LIPITOR) 10 MG tablet Take 10 mg by mouth daily.   . CONTOUR NEXT TEST test strip USE 1 STRIP TO CHECK GLUCOSE FIVE TIMES DAILY   . ergocalciferol (VITAMIN D2) 50000 units capsule Take 1 capsule (50,000 Units total) by mouth once a week.   . insulin detemir (LEVEMIR) 100 UNIT/ML injection Inject 0.15 mLs (15 Units total) into the skin 2 (two) times daily. BACKUP FOR PUMP ONLY   . lisinopril (PRINIVIL,ZESTRIL) 10 MG tablet TAKE ONE TABLET BY MOUTH IN THE  MORNING FOR  BLOOD  PRESSURE   . meloxicam (MOBIC) 15 MG tablet    . NOVOLOG 100 UNIT/ML injection USE MAX OF 100 UNITS PER DAY WITH PUMP 09/21/2016: Continuous  . venlafaxine XR (EFFEXOR-XR) 75 MG 24 hr capsule Take 37.5 mg by mouth daily with breakfast.    . [DISCONTINUED] buPROPion (WELLBUTRIN XL) 150 MG 24 hr tablet Take 150 mg by mouth daily.    . [DISCONTINUED] Prenatal Vit-Fe Fumarate-FA (PRENATAL PO) Take 1 tablet by mouth daily.    No facility-administered encounter medications on file as of 12/24/2017.      ONCOLOGIC FAMILY HISTORY:  Family History  Problem Relation Age of Onset  . Cancer Maternal Aunt        breast  . Cancer Maternal Uncle 50        right eye removed s/p cancer  . Hypertension Unknown   . Diabetes  Unknown   . Deep vein thrombosis Unknown   . Kidney disease Unknown   . Cancer Maternal Grandfather        prostate ca    GENETIC COUNSELING/TESTING: Eligible for updated testing last year, done and negative, except for variants  SOCIAL HISTORY:  ZHARIA CONROW is married and lives with her husband and three children in [place], [place].  Her children are 54, 19 and 18.   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  She denies any current or history of tobacco, alcohol, or illicit drug use.  (Updated on 12/24/2017)   PHYSICAL EXAMINATION:  Vital Signs: Vitals:   12/24/17 1503  BP: (!) 149/86  Pulse: 80  Resp: 18  Temp: 98.7 F (37.1 C)  SpO2: 100%   Filed Weights   12/24/17 1503  Weight: 207 lb (93.9 kg)   General: Well-nourished, well-appearing female in no acute distress. Unaccompanied today.   HEENT: Head is normocephalic.  Pupils equal and reactive to light. Conjunctivae clear without exudate.  Sclerae anicteric. Oral mucosa is pink, moist.  Oropharynx is pink without lesions or erythema.  Lymph: No cervical, supraclavicular, or infraclavicular lymphadenopathy noted on palpation.    Cardiovascular: Regular rate and rhythm.Marland Kitchen Respiratory: Clear to auscultation bilaterally. Chest expansion symmetric; breathing non-labored.  Breast Exam:  -Left breast: breast is surgically absent, no nodularity, thickening or sign of recurrence.  -Right breast: No appreciable masses on palpation. No skin redness, thickening, or peau d'orange appearance; no nipple retraction or nipple discharge;  -Axilla: No axillary adenopathy bilaterally.  GI: Abdomen soft and round; non-tender, non-distended. Bowel sounds normoactive. No hepatosplenomegaly.   GU: Deferred.  Neuro: No focal deficits. Steady gait.  Psych: Mood and affect normal and appropriate for situation.  MSK: No focal spinal tenderness to palpation, full range of motion in bilateral upper extremities Extremities: No edema. Skin: Warm and dry.  LABORATORY DATA:  None for this visit   DIAGNOSTIC IMAGING:  Most recent mammogram:     ASSESSMENT AND PLAN:  Ms.. Herrig is a pleasant 47 y.o. female with history of Stage IIIA left breast invasive ductal carcinoma, ER-/PR-/HER2-, diagnosed in 12/2010, treated with mastectomy, adjuvant chemotherapy, and adjuvant radiation therapy.  She presents to the [facility] Clinic for surveillance and routine follow-up.   1. History of breast cancer:  Ms. Battle is currently clinically and radiographically without evidence of disease or recurrence of breast cancer. She will be due for mammogram in 03/2018. She was offered graduation today and she will f/u with her PCP for future breast cancer surveillance.  We are happy to see her here on an as needed basis.    2. Sciatic pain: xrays in 11/2016 negative, MRI spine ordered.  Recommended subsequent f/u with PCP about this.  3. Cancer screening:  Due to Ms. Poplar's history and her age, she should receive screening for skin cancers, colon cancer, and gynecologic cancers. She was encouraged to follow-up with her PCP for appropriate cancer screenings.    4. Health maintenance and wellness promotion: Ms. Beery was encouraged to consume 5-7 servings of fruits and vegetables per day. She was also encouraged to engage in moderate to vigorous exercise for 30 minutes per day most days of the week. She was instructed to limit her alcohol consumption and continue to abstain from tobacco use.      Dispo:  -Return to cancer center PRN -Mammogram in 03/2018   A total of (30) minutes of face-to-face  time was spent with this patient with greater than 50% of that time in counseling and care-coordination.   Gardenia Phlegm, NP Survivorship Program [place] [phone]   Note: PRIMARY CARE PROVIDER Gaynelle Arabian, [place] [phone]

## 2017-12-25 ENCOUNTER — Other Ambulatory Visit: Payer: Self-pay | Admitting: Adult Health

## 2017-12-25 ENCOUNTER — Telehealth: Payer: Self-pay | Admitting: Adult Health

## 2017-12-25 DIAGNOSIS — Z171 Estrogen receptor negative status [ER-]: Principal | ICD-10-CM

## 2017-12-25 DIAGNOSIS — G8929 Other chronic pain: Secondary | ICD-10-CM

## 2017-12-25 DIAGNOSIS — M5441 Lumbago with sciatica, right side: Secondary | ICD-10-CM

## 2017-12-25 DIAGNOSIS — C50412 Malignant neoplasm of upper-outer quadrant of left female breast: Secondary | ICD-10-CM

## 2017-12-25 NOTE — Telephone Encounter (Signed)
Per 11/25 no los 

## 2018-01-01 ENCOUNTER — Inpatient Hospital Stay: Payer: Commercial Managed Care - PPO | Attending: Adult Health

## 2018-01-01 ENCOUNTER — Telehealth: Payer: Self-pay

## 2018-01-01 DIAGNOSIS — Z853 Personal history of malignant neoplasm of breast: Secondary | ICD-10-CM | POA: Diagnosis not present

## 2018-01-01 DIAGNOSIS — C50412 Malignant neoplasm of upper-outer quadrant of left female breast: Secondary | ICD-10-CM

## 2018-01-01 DIAGNOSIS — M5441 Lumbago with sciatica, right side: Secondary | ICD-10-CM

## 2018-01-01 DIAGNOSIS — G8929 Other chronic pain: Secondary | ICD-10-CM

## 2018-01-01 DIAGNOSIS — Z171 Estrogen receptor negative status [ER-]: Secondary | ICD-10-CM

## 2018-01-01 LAB — BASIC METABOLIC PANEL - CANCER CENTER ONLY
ANION GAP: 8 (ref 5–15)
BUN: 12 mg/dL (ref 6–20)
CHLORIDE: 105 mmol/L (ref 98–111)
CO2: 29 mmol/L (ref 22–32)
Calcium: 10 mg/dL (ref 8.9–10.3)
Creatinine: 0.99 mg/dL (ref 0.44–1.00)
GFR, Est AFR Am: >60 mL/min (ref >60)
GFR, Estimated: >60 mL/min (ref >60)
Glucose, Bld: 246 mg/dL — ABNORMAL HIGH (ref 70–99)
POTASSIUM: 5.5 mmol/L — AB (ref 3.5–5.1)
SODIUM: 142 mmol/L (ref 135–145)

## 2018-01-01 NOTE — Telephone Encounter (Signed)
Labs faxed to PCP, Gaynelle Arabian, MD

## 2018-01-01 NOTE — Telephone Encounter (Signed)
-----   Message from Gardenia Phlegm, NP sent at 01/01/2018  3:00 PM EST ----- Please fax labs to patient pcp ----- Message ----- From: Interface, Lab In [place] Sent: 01/01/2018   2:59 PM EST To: Gardenia Phlegm, NP

## 2018-01-03 ENCOUNTER — Ambulatory Visit (HOSPITAL_COMMUNITY): Admission: RE | Admit: 2018-01-03 | Payer: Commercial Managed Care - PPO | Source: Ambulatory Visit

## 2018-01-03 ENCOUNTER — Ambulatory Visit (HOSPITAL_COMMUNITY)
Admission: RE | Admit: 2018-01-03 | Discharge: 2018-01-03 | Disposition: A | Discharge status: Home / Self Care | Payer: Commercial Managed Care - PPO | Source: Ambulatory Visit | Attending: Adult Health | Admitting: Adult Health

## 2018-01-03 DIAGNOSIS — M4184 Other forms of scoliosis, thoracic region: Secondary | ICD-10-CM | POA: Insufficient documentation

## 2018-01-03 DIAGNOSIS — Z171 Estrogen receptor negative status [ER-]: Secondary | ICD-10-CM | POA: Diagnosis not present

## 2018-01-03 DIAGNOSIS — M5124 Other intervertebral disc displacement, thoracic region: Secondary | ICD-10-CM | POA: Diagnosis not present

## 2018-01-03 DIAGNOSIS — M47814 Spondylosis without myelopathy or radiculopathy, thoracic region: Secondary | ICD-10-CM | POA: Diagnosis not present

## 2018-01-03 DIAGNOSIS — C50412 Malignant neoplasm of upper-outer quadrant of left female breast: Secondary | ICD-10-CM | POA: Insufficient documentation

## 2018-01-03 DIAGNOSIS — M5441 Lumbago with sciatica, right side: Secondary | ICD-10-CM | POA: Insufficient documentation

## 2018-01-03 DIAGNOSIS — G8929 Other chronic pain: Secondary | ICD-10-CM | POA: Insufficient documentation

## 2018-01-03 DIAGNOSIS — M5127 Other intervertebral disc displacement, lumbosacral region: Secondary | ICD-10-CM | POA: Insufficient documentation

## 2018-01-03 MED ORDER — GADOBUTROL 1 MMOL/ML IV SOLN
9.0000 mL | Freq: Once | INTRAVENOUS | Status: AC | PRN
  Administered 2018-01-03: 9 mL via INTRAVENOUS

## 2018-01-04 LAB — POCT I-STAT CREATININE: Creatinine, Ser: 1 mg/dL (ref 0.44–1.00)

## 2018-01-07 ENCOUNTER — Encounter: Payer: Self-pay | Admitting: Adult Health

## 2018-01-07 ENCOUNTER — Telehealth: Payer: Self-pay | Admitting: Adult Health

## 2018-01-07 NOTE — Telephone Encounter (Signed)
Called to review patient MRI spine results.  Left message on her voice mail.  Wilber Bihari, NP

## 2018-01-09 ENCOUNTER — Telehealth: Payer: Self-pay | Admitting: Adult Health

## 2018-01-09 NOTE — Telephone Encounter (Signed)
Reviewed results with patient.  No cancer in spine, but appears to have disc extrusion at l5 to s1 that is causing some nerve compression.  Recommend she take these results to her PCP to determine what steps next are necessary and to start with conservative management, if not started already.  [name] voiced understanding and thanked me for call.  Wilber Bihari, NP

## 2018-01-17 DIAGNOSIS — F411 Generalized anxiety disorder: Secondary | ICD-10-CM | POA: Diagnosis not present

## 2018-01-17 DIAGNOSIS — E065 Other chronic thyroiditis: Secondary | ICD-10-CM | POA: Diagnosis not present

## 2018-01-17 DIAGNOSIS — F332 Major depressive disorder, recurrent severe without psychotic features: Secondary | ICD-10-CM | POA: Diagnosis not present

## 2018-01-17 DIAGNOSIS — Z794 Long term (current) use of insulin: Secondary | ICD-10-CM | POA: Diagnosis not present

## 2018-01-17 DIAGNOSIS — E1065 Type 1 diabetes mellitus with hyperglycemia: Secondary | ICD-10-CM | POA: Diagnosis not present

## 2018-01-29 DIAGNOSIS — C50912 Malignant neoplasm of unspecified site of left female breast: Secondary | ICD-10-CM | POA: Diagnosis not present

## 2018-02-20 DIAGNOSIS — F332 Major depressive disorder, recurrent severe without psychotic features: Secondary | ICD-10-CM | POA: Diagnosis not present

## 2018-02-20 DIAGNOSIS — F411 Generalized anxiety disorder: Secondary | ICD-10-CM | POA: Diagnosis not present

## 2018-02-25 ENCOUNTER — Other Ambulatory Visit: Payer: Self-pay | Admitting: Family Medicine

## 2018-02-25 DIAGNOSIS — Z1231 Encounter for screening mammogram for malignant neoplasm of breast: Secondary | ICD-10-CM

## 2018-03-06 DIAGNOSIS — F411 Generalized anxiety disorder: Secondary | ICD-10-CM | POA: Diagnosis not present

## 2018-03-06 DIAGNOSIS — F332 Major depressive disorder, recurrent severe without psychotic features: Secondary | ICD-10-CM | POA: Diagnosis not present

## 2018-03-07 DIAGNOSIS — F411 Generalized anxiety disorder: Secondary | ICD-10-CM | POA: Diagnosis not present

## 2018-03-07 DIAGNOSIS — F332 Major depressive disorder, recurrent severe without psychotic features: Secondary | ICD-10-CM | POA: Diagnosis not present

## 2018-03-14 DIAGNOSIS — B349 Viral infection, unspecified: Secondary | ICD-10-CM | POA: Diagnosis not present

## 2018-03-14 DIAGNOSIS — R509 Fever, unspecified: Secondary | ICD-10-CM | POA: Diagnosis not present

## 2018-03-21 ENCOUNTER — Other Ambulatory Visit: Payer: Self-pay

## 2018-03-21 ENCOUNTER — Emergency Department (HOSPITAL_COMMUNITY)
Admission: EM | Admit: 2018-03-21 | Discharge: 2018-03-21 | Disposition: A | Discharge status: Home / Self Care | Payer: Commercial Managed Care - PPO | Attending: Emergency Medicine | Admitting: Emergency Medicine

## 2018-03-21 ENCOUNTER — Encounter (HOSPITAL_COMMUNITY): Payer: Self-pay

## 2018-03-21 DIAGNOSIS — Z79899 Other long term (current) drug therapy: Secondary | ICD-10-CM | POA: Insufficient documentation

## 2018-03-21 DIAGNOSIS — M546 Pain in thoracic spine: Secondary | ICD-10-CM | POA: Diagnosis not present

## 2018-03-21 DIAGNOSIS — I10 Essential (primary) hypertension: Secondary | ICD-10-CM | POA: Insufficient documentation

## 2018-03-21 DIAGNOSIS — E109 Type 1 diabetes mellitus without complications: Secondary | ICD-10-CM | POA: Diagnosis not present

## 2018-03-21 MED ORDER — CYCLOBENZAPRINE HCL 10 MG PO TABS: 10.0000 mg | ORAL_TABLET | Freq: Two times a day (BID) | ORAL | 0 refills | Status: DC | PRN
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
## 2018-03-25 ENCOUNTER — Ambulatory Visit: Payer: Commercial Managed Care - PPO

## 2018-03-26 ENCOUNTER — Ambulatory Visit
Admission: RE | Admit: 2018-03-26 | Discharge: 2018-03-26 | Disposition: A | Discharge status: Home / Self Care | Payer: Commercial Managed Care - PPO | Source: Ambulatory Visit | Attending: Family Medicine | Admitting: Family Medicine

## 2018-03-26 DIAGNOSIS — Z1231 Encounter for screening mammogram for malignant neoplasm of breast: Secondary | ICD-10-CM

## 2018-03-26 HISTORY — DX: Personal history of irradiation: Z92.3

## 2018-03-26 HISTORY — DX: Personal history of antineoplastic chemotherapy: Z92.21

## 2018-04-04 DIAGNOSIS — F332 Major depressive disorder, recurrent severe without psychotic features: Secondary | ICD-10-CM | POA: Diagnosis not present

## 2018-04-04 DIAGNOSIS — F411 Generalized anxiety disorder: Secondary | ICD-10-CM | POA: Diagnosis not present

## 2018-04-05 DIAGNOSIS — S199XXD Unspecified injury of neck, subsequent encounter: Secondary | ICD-10-CM | POA: Diagnosis not present

## 2018-04-05 DIAGNOSIS — R51 Headache: Secondary | ICD-10-CM | POA: Diagnosis not present

## 2018-04-05 DIAGNOSIS — M549 Dorsalgia, unspecified: Secondary | ICD-10-CM | POA: Diagnosis not present

## 2018-04-05 DIAGNOSIS — M542 Cervicalgia: Secondary | ICD-10-CM | POA: Diagnosis not present

## 2018-04-23 DIAGNOSIS — E1065 Type 1 diabetes mellitus with hyperglycemia: Secondary | ICD-10-CM | POA: Diagnosis not present

## 2018-04-23 DIAGNOSIS — Z794 Long term (current) use of insulin: Secondary | ICD-10-CM | POA: Diagnosis not present

## 2018-04-23 DIAGNOSIS — E065 Other chronic thyroiditis: Secondary | ICD-10-CM | POA: Diagnosis not present

## 2018-05-02 DIAGNOSIS — F411 Generalized anxiety disorder: Secondary | ICD-10-CM | POA: Diagnosis not present

## 2018-05-02 DIAGNOSIS — F332 Major depressive disorder, recurrent severe without psychotic features: Secondary | ICD-10-CM | POA: Diagnosis not present

## 2018-05-03 DIAGNOSIS — F332 Major depressive disorder, recurrent severe without psychotic features: Secondary | ICD-10-CM | POA: Diagnosis not present

## 2018-05-03 DIAGNOSIS — F411 Generalized anxiety disorder: Secondary | ICD-10-CM | POA: Diagnosis not present

## 2018-05-14 DIAGNOSIS — E1065 Type 1 diabetes mellitus with hyperglycemia: Secondary | ICD-10-CM | POA: Diagnosis not present

## 2018-05-16 DIAGNOSIS — E1065 Type 1 diabetes mellitus with hyperglycemia: Secondary | ICD-10-CM | POA: Diagnosis not present

## 2018-07-08 DIAGNOSIS — E7800 Pure hypercholesterolemia, unspecified: Secondary | ICD-10-CM | POA: Diagnosis not present

## 2018-07-08 DIAGNOSIS — E133299 Other specified diabetes mellitus with mild nonproliferative diabetic retinopathy without macular edema, unspecified eye: Secondary | ICD-10-CM | POA: Diagnosis not present

## 2018-07-08 DIAGNOSIS — M1990 Unspecified osteoarthritis, unspecified site: Secondary | ICD-10-CM | POA: Diagnosis not present

## 2018-07-08 DIAGNOSIS — N951 Menopausal and female climacteric states: Secondary | ICD-10-CM | POA: Diagnosis not present

## 2018-07-08 DIAGNOSIS — Z794 Long term (current) use of insulin: Secondary | ICD-10-CM | POA: Diagnosis not present

## 2018-07-08 DIAGNOSIS — E559 Vitamin D deficiency, unspecified: Secondary | ICD-10-CM | POA: Diagnosis not present

## 2018-07-08 DIAGNOSIS — F325 Major depressive disorder, single episode, in full remission: Secondary | ICD-10-CM | POA: Diagnosis not present

## 2018-07-08 DIAGNOSIS — E1042 Type 1 diabetes mellitus with diabetic polyneuropathy: Secondary | ICD-10-CM | POA: Diagnosis not present

## 2018-07-08 DIAGNOSIS — I10 Essential (primary) hypertension: Secondary | ICD-10-CM | POA: Diagnosis not present

## 2018-07-08 DIAGNOSIS — E1139 Type 2 diabetes mellitus with other diabetic ophthalmic complication: Secondary | ICD-10-CM | POA: Diagnosis not present

## 2018-07-29 DIAGNOSIS — I10 Essential (primary) hypertension: Secondary | ICD-10-CM | POA: Diagnosis not present

## 2018-07-29 DIAGNOSIS — Z794 Long term (current) use of insulin: Secondary | ICD-10-CM | POA: Diagnosis not present

## 2018-07-29 DIAGNOSIS — E065 Other chronic thyroiditis: Secondary | ICD-10-CM | POA: Diagnosis not present

## 2018-07-29 DIAGNOSIS — E7800 Pure hypercholesterolemia, unspecified: Secondary | ICD-10-CM | POA: Diagnosis not present

## 2018-07-29 DIAGNOSIS — E1065 Type 1 diabetes mellitus with hyperglycemia: Secondary | ICD-10-CM | POA: Diagnosis not present

## 2018-08-07 DIAGNOSIS — E1065 Type 1 diabetes mellitus with hyperglycemia: Secondary | ICD-10-CM | POA: Diagnosis not present

## 2018-08-07 DIAGNOSIS — Z794 Long term (current) use of insulin: Secondary | ICD-10-CM | POA: Diagnosis not present

## 2018-09-06 ENCOUNTER — Encounter (HOSPITAL_COMMUNITY): Payer: Self-pay | Admitting: Emergency Medicine

## 2018-09-06 ENCOUNTER — Other Ambulatory Visit: Payer: Self-pay

## 2018-09-06 ENCOUNTER — Ambulatory Visit (HOSPITAL_COMMUNITY)
Admission: EM | Admit: 2018-09-06 | Discharge: 2018-09-06 | Disposition: A | Discharge status: Home / Self Care | Payer: Commercial Managed Care - PPO | Attending: Family Medicine | Admitting: Family Medicine

## 2018-09-06 DIAGNOSIS — R21 Rash and other nonspecific skin eruption: Secondary | ICD-10-CM | POA: Diagnosis not present

## 2018-09-06 MED ORDER — HYDROXYZINE HCL 25 MG PO TABS: 25.0000 mg | ORAL_TABLET | ORAL | 0 refills | Status: DC | PRN

## 2018-09-06 MED ORDER — TRIAMCINOLONE ACETONIDE 0.1 % EX CREA: 1.0000 "application " | TOPICAL_CREAM | Freq: Two times a day (BID) | CUTANEOUS | 0 refills | Status: DC

## 2018-09-06 NOTE — ED Triage Notes (Signed)
Pt states 4 days ago her "face broke out". Pt took some benadryl without relief. C/o itchiness on her face.

## 2018-09-06 NOTE — Discharge Instructions (Signed)
Avoid sushi until you determine what you are allergic to Use the cream two times a day Take the hydroxyzine as needed for itch.  This is helpful at night

## 2018-09-06 NOTE — ED Provider Notes (Signed)
[facility]    CSN: [id number] Arrival date & time: 09/06/18  1341      History   Chief Complaint Chief Complaint  Patient presents with  . Rash    HPI [name] is a 48 y.o. female.   HPI Patient is here for a rash.  She has a fine rash that covers her face around her ears.  It is itching terribly.  She is tried some Benadryl.  This is not helped. She feels like she is allergic to something.  The only thing new that she has eaten is associated.  She has been eating this for the last couple of weeks. No new soap, lotion, powder or product on her face. She does know she has sensitive skin and must be careful what she uses for cleansing and moisturizing. No difficulty breathing.  No rash elsewhere. Past Medical History:  Diagnosis Date  . Allergy   . Blood clot in vein    nonocclusive clot right subclavian vein, on Xarelto for 6 months  . Breast cancer ([place])    T2N2 tiple negative left breast ca   . Breast cancer ([place])   . Breast mass in female    fibrocystic changes  . Cataracts, bilateral   . Diabetes mellitus    insulin dependant - uses pump, Dr. Dwyane Dee  . Family history of breast cancer   . Family history of prostate cancer   . History of chemotherapy    taxotere/cytoxan with neulasta support  . Hyperlipidemia   . Hypertension   . Lymphedema of arm    left upper extremity  . Nausea   . Nausea & vomiting    for approx. 2 weeks  . Personal history of chemotherapy   . Personal history of radiation therapy   . Radiation 08/21/11-10/05/11   Left chestwll/Supraclav./left PAB/left scar  . S/P chemotherapy, time since 4-12 weeks     Patient Active Problem List   Diagnosis Date Noted  . Genetic testing 02/27/2017  . Family history of breast cancer   . Family history of prostate cancer   . Type 1 diabetes mellitus with ketoacidosis without coma ([place])   . Vitamin D deficiency disease 11/23/2014  . Change in hearing of right ear 11/09/2014  .  Essential hypertension 12/04/2012  . Status post left mastectomy 10/01/2012  . Other and unspecified ovarian cyst 08/12/2012  . Diabetic retinopathy ([place]) 07/12/2012  . Jaw pain 06/26/2012  . Mass of left side of neck 02/06/2012  . Breast carcinoma metastatic, skin 05/08/2011  . Cancer of left breast ([place]) 01/19/2011  . Patellofemoral syndrome, Right 03/10/2010  . THORACIC SOMATIC DYSFUNCTION 02/15/2010  . SOMATIC DYSFUNCTION, RIB CAGE 02/15/2010  . ROTATOR CUFF INJURY, LEFT SHOULDER 11/09/2009  . HOT FLASHES 06/14/2007  . Type I diabetes mellitus with complication, uncontrolled ([place]) 11/16/2006  . HYPERLIPIDEMIA 11/16/2006  . Major depressive disorder, single episode 11/16/2006    Past Surgical History:  Procedure Laterality Date  . ABDOMINAL HYSTERECTOMY  2002   still has ovaries   . bladder tacking  01/2009  . left mastectomy  02/21/2011  . MASS EXCISION  02/19/2012   Procedure: MINOR EXCISION OF MASS;  Surgeon: Adin Hector, MD;  Location: [facility];  Service: General;  Laterality: Left;  Marland Kitchen MASTECTOMY MODIFIED RADICAL  02/21/11   left,invasive  multifocal grade III ductal ca,high grade dcis,lymph/vascular invasion invasion (7/14) nodes positive mets  . PORT-A-CATH REMOVAL  10/13/2011   Procedure: MINOR REMOVAL PORT-A-CATH;  Surgeon: Adin Hector, MD;  Location: [facility];  Service: General;  Laterality: Right;  PORT-A CATH REMOVAL  . PORTACATH PLACEMENT  02/21/2011   Procedure: INSERTION PORT-A-CATH;  Surgeon: Adin Hector, MD;  Location: [facility];  Service: General;  Laterality: N/A;  . SHOULDER SURGERY  04/26/10   left  . TUBAL LIGATION  12/1999    OB History   No obstetric history on file.    Obstetric Comments  G4P3, menarche age 89, and menopause at time of hysterectomy; no hx of hormone replacement therapy         Home Medications    Prior to Admission medications   Medication Sig Start Date End Date Taking? Authorizing  Provider  atorvastatin (LIPITOR) 10 MG tablet Take 10 mg by mouth daily. 06/01/15   [provider]  CONTOUR NEXT TEST test strip USE 1 STRIP TO CHECK GLUCOSE FIVE TIMES DAILY 07/17/17   [provider]  ergocalciferol (VITAMIN D2) 50000 units capsule Take 1 capsule (50,000 Units total) by mouth once a week. 12/22/16   Gardenia Phlegm, NP  hydrOXYzine (ATARAX/VISTARIL) 25 MG tablet Take 1-2 tablets (25-50 mg total) by mouth every 4 (four) hours as needed. 09/06/18   Raylene Everts, MD  insulin detemir (LEVEMIR) 100 UNIT/ML injection Inject 0.15 mLs (15 Units total) into the skin 2 (two) times daily. BACKUP FOR PUMP ONLY 03/23/16   Waldemar Dickens, MD  lisinopril (PRINIVIL,ZESTRIL) 10 MG tablet TAKE ONE TABLET BY MOUTH IN THE MORNING FOR  BLOOD  PRESSURE 04/17/14   Leone Brand, MD  NOVOLOG 100 UNIT/ML injection USE MAX OF 100 UNITS PER DAY WITH PUMP 01/27/16   Lucila Maine C, DO  triamcinolone cream (KENALOG) 0.1 % Apply 1 application topically 2 (two) times daily. Until rash clears 09/06/18   Raylene Everts, MD  venlafaxine XR (EFFEXOR-XR) 75 MG 24 hr capsule Take 37.5 mg by mouth daily with breakfast.     [provider]    Family History Family History  Problem Relation Age of Onset  . Cancer Maternal Aunt        breast  . Breast cancer Maternal Aunt   . Cancer Maternal Uncle 2        right eye removed s/p cancer  . Hypertension Other   . Diabetes Other   . Deep vein thrombosis Other   . Kidney disease Other   . Cancer Maternal Grandfather        prostate ca    Social History Social History   Tobacco Use  . Smoking status: Never Smoker  . Smokeless tobacco: Never Used  Substance Use Topics  . Alcohol use: No  . Drug use: No     Allergies   Hydrocodone-acetaminophen; Insulin glargine; Nph iletin i [insulin isophane, mixed nph]; and Lantus [insulin glargine]   Review of Systems Review of Systems  Constitutional: Negative for  chills and fever.  HENT: Negative for ear pain and sore throat.   Eyes: Negative for pain and visual disturbance.  Respiratory: Negative for cough and shortness of breath.   Cardiovascular: Negative for chest pain and palpitations.  Gastrointestinal: Negative for abdominal pain and vomiting.  Genitourinary: Negative for dysuria and hematuria.  Musculoskeletal: Negative for arthralgias and back pain.  Skin: Positive for rash. Negative for color change.  Neurological: Negative for seizures and syncope.  All other systems reviewed and are negative.    Physical Exam Triage Vital Signs ED Triage Vitals  Enc Vitals Group     BP 09/06/18 1417 (!) 141/85     Pulse Rate 09/06/18 1417 82     Resp 09/06/18 1417 16     Temp 09/06/18 1417 98 F (36.7 C)     Temp src --      SpO2 09/06/18 1417 100 %     Weight --      Height --      Head Circumference --      Peak Flow --      Pain Score 09/06/18 1419 0     Pain Loc --      Pain Edu? --      Excl. in [place]? --    No data found.  Updated Vital Signs BP (!) 141/85   Pulse 82   Temp 98 F (36.7 C)   Resp 16   SpO2 100%   Visual Acuity Right Eye Distance:   Left Eye Distance:   Bilateral Distance:    Right Eye Near:   Left Eye Near:    Bilateral Near:     Physical Exam Constitutional:      General: She is not in acute distress.    Appearance: She is well-developed. She is not ill-appearing.  HENT:     Head: Normocephalic and atraumatic.  Eyes:     Conjunctiva/sclera: Conjunctivae normal.     Pupils: Pupils are equal, round, and reactive to light.  Neck:     Musculoskeletal: Normal range of motion.  Cardiovascular:     Rate and Rhythm: Normal rate and regular rhythm.     Heart sounds: Normal heart sounds.  Pulmonary:     Effort: Pulmonary effort is normal. No respiratory distress.     Breath sounds: Normal breath sounds. No wheezing.  Abdominal:     General: There is no distension.     Palpations: Abdomen is soft.   Musculoskeletal: Normal range of motion.  Skin:    General: Skin is warm and dry.     Comments: Very fine papular rash as described of her face and ears.  None on the neck chest or back.  Pharynx is benign.  Lungs are clear  Neurological:     Mental Status: She is alert.  Psychiatric:        Mood and Affect: Mood normal.        Behavior: Behavior normal.      UC Treatments / Results  Labs (all labs ordered are listed, but only abnormal results are displayed) Labs Reviewed - No data to display  EKG   Radiology No results found.  Procedures Procedures (including critical care time)  Medications Ordered in UC Medications - No data to display  Initial Impression / Assessment and Plan / UC Course  I have reviewed the triage vital signs and the nursing notes.  Pertinent labs & imaging results that were available during my care of the patient were reviewed by me and considered in my medical decision making (see chart for details).      Final Clinical Impressions(s) / UC Diagnoses   Final diagnoses:  Rash and nonspecific skin eruption     Discharge Instructions     Avoid sushi until you determine what you are allergic to Use the cream two times a day Take the hydroxyzine as needed for itch.  This is helpful at night    ED Prescriptions    Medication Sig Dispense Auth. Provider   hydrOXYzine (ATARAX/VISTARIL) 25 MG tablet Take 1-2 tablets (  25-50 mg total) by mouth every 4 (four) hours as needed. 20 tablet Raylene Everts, MD   triamcinolone cream (KENALOG) 0.1 % Apply 1 application topically 2 (two) times daily. Until rash clears 15 g Raylene Everts, MD     Controlled Substance Prescriptions [place] Controlled Substance Registry consulted? Not Applicable   Raylene Everts, MD 09/06/18 1459

## 2018-11-11 DIAGNOSIS — Z794 Long term (current) use of insulin: Secondary | ICD-10-CM | POA: Diagnosis not present

## 2018-11-11 DIAGNOSIS — E1065 Type 1 diabetes mellitus with hyperglycemia: Secondary | ICD-10-CM | POA: Diagnosis not present

## 2018-11-11 DIAGNOSIS — E065 Other chronic thyroiditis: Secondary | ICD-10-CM | POA: Diagnosis not present

## 2018-12-13 ENCOUNTER — Encounter: Payer: Self-pay | Admitting: Plastic Surgery

## 2018-12-13 ENCOUNTER — Ambulatory Visit (INDEPENDENT_AMBULATORY_CARE_PROVIDER_SITE_OTHER): Payer: Commercial Managed Care - PPO | Admitting: Plastic Surgery

## 2018-12-13 ENCOUNTER — Other Ambulatory Visit: Payer: Self-pay

## 2018-12-13 VITALS — BP 124/82 | HR 77 | Temp 97.9°F | Ht 65.0 in | Wt 198.0 lb

## 2018-12-13 DIAGNOSIS — Z9012 Acquired absence of left breast and nipple: Secondary | ICD-10-CM

## 2018-12-13 DIAGNOSIS — E1010 Type 1 diabetes mellitus with ketoacidosis without coma: Secondary | ICD-10-CM | POA: Diagnosis not present

## 2018-12-13 NOTE — Progress Notes (Signed)
Patient ID: [name], female    DOB: [birth year], 48 y.o.   MRN: [id number]   Chief Complaint  Patient presents with  . Breast Problem    The patient is a 48 year old black female here for consultation for breast reconstruction.  She underwent a routine mammogram in 2013.  She was found to have some an abnormal findings.  This led to an ultrasound, biopsy and then an MRI which resulted in a left mastectomy with lymph node dissection and 7+ nodes.  She underwent chemotherapy and radiation which finished in September 2013.  She is 5 feet 5 inches tall and weighs 198 pounds.  Her bra size is a 40 D on the right.  Her prosthetic is still very heavy.  Her surgery had been planned in 2014 but she had extremely elevated hemoglobin A1c results.  She is doing much better and her HgA1C is now in the 7 range.  She had gained a significant amount of weight but has improved it from her weight in 2013 which was 187 pounds she is still interested in a latissimus muscle flap with an expander placement due to her history of radiation.  She has an insulin pump which has been very helpful and keeping her blood sugars regulated.  She would at some point like to have the right breast reduced with a mastopexy.    Review of Systems  Constitutional: Negative for activity change and appetite change.  HENT: Negative.   Eyes: Negative.   Respiratory: Negative.  Negative for chest tightness.   Cardiovascular: Negative for leg swelling.  Gastrointestinal: Negative for abdominal distention and abdominal pain.  Endocrine: Negative.   Genitourinary: Negative.   Musculoskeletal: Negative.   Neurological: Negative.   Hematological: Negative.   Psychiatric/Behavioral: Negative.     Past Medical History:  Diagnosis Date  . Allergy   . Blood clot in vein    nonocclusive clot right subclavian vein, on Xarelto for 6 months  . Breast cancer ([place])    T2N2 tiple negative left breast ca   . Breast cancer ([place])   .  Breast mass in female    fibrocystic changes  . Cataracts, bilateral   . Diabetes mellitus    insulin dependant - uses pump, Dr. Dwyane Dee  . Family history of breast cancer   . Family history of prostate cancer   . History of chemotherapy    taxotere/cytoxan with neulasta support  . Hyperlipidemia   . Hypertension   . Lymphedema of arm    left upper extremity  . Nausea   . Nausea & vomiting    for approx. 2 weeks  . Personal history of chemotherapy   . Personal history of radiation therapy   . Radiation 08/21/11-10/05/11   Left chestwll/Supraclav./left PAB/left scar  . S/P chemotherapy, time since 4-12 weeks     Past Surgical History:  Procedure Laterality Date  . ABDOMINAL HYSTERECTOMY  2002   still has ovaries   . bladder tacking  01/2009  . left mastectomy  02/21/2011  . MASS EXCISION  02/19/2012   Procedure: MINOR EXCISION OF MASS;  Surgeon: Adin Hector, MD;  Location: [facility];  Service: General;  Laterality: Left;  Marland Kitchen MASTECTOMY MODIFIED RADICAL  02/21/11   left,invasive  multifocal grade III ductal ca,high grade dcis,lymph/vascular invasion invasion (7/14) nodes positive mets  . PORT-A-CATH REMOVAL  10/13/2011   Procedure: MINOR REMOVAL PORT-A-CATH;  Surgeon: Adin Hector, MD;  Location: [facility];  Service: General;  Laterality: Right;  PORT-A CATH REMOVAL  . PORTACATH PLACEMENT  02/21/2011   Procedure: INSERTION PORT-A-CATH;  Surgeon: Adin Hector, MD;  Location: [facility];  Service: General;  Laterality: N/A;  . SHOULDER SURGERY  04/26/10   left  . TUBAL LIGATION  12/1999      Current Outpatient Medications:  .  atorvastatin (LIPITOR) 10 MG tablet, Take 10 mg by mouth daily., Disp: , Rfl:  .  CONTOUR NEXT TEST test strip, USE 1 STRIP TO CHECK GLUCOSE FIVE TIMES DAILY, Disp: , Rfl: 4 .  ergocalciferol (VITAMIN D2) 50000 units capsule, Take 1 capsule (50,000 Units total) by mouth once a week., Disp: 12 capsule, Rfl: 3 .  hydrOXYzine  (ATARAX/VISTARIL) 25 MG tablet, Take 1-2 tablets (25-50 mg total) by mouth every 4 (four) hours as needed., Disp: 20 tablet, Rfl: 0 .  insulin detemir (LEVEMIR) 100 UNIT/ML injection, Inject 0.15 mLs (15 Units total) into the skin 2 (two) times daily. BACKUP FOR PUMP ONLY, Disp: 10 mL, Rfl: 0 .  lisinopril (PRINIVIL,ZESTRIL) 10 MG tablet, TAKE ONE TABLET BY MOUTH IN THE MORNING FOR  BLOOD  PRESSURE, Disp: 90 tablet, Rfl: 3 .  NOVOLOG 100 UNIT/ML injection, USE MAX OF 100 UNITS PER DAY WITH PUMP, Disp: 10 mL, Rfl: 12 .  triamcinolone cream (KENALOG) 0.1 %, Apply 1 application topically 2 (two) times daily. Until rash clears, Disp: 15 g, Rfl: 0 .  venlafaxine XR (EFFEXOR-XR) 75 MG 24 hr capsule, Take 37.5 mg by mouth daily with breakfast. , Disp: , Rfl:    Objective:   Vitals:   12/13/18 1112  BP: 124/82  Pulse: 77  Temp: 97.9 F (36.6 C)  SpO2: 100%    Physical Exam Vitals signs and nursing note reviewed.  Constitutional:      Appearance: Normal appearance.  HENT:     Head: Normocephalic and atraumatic.  Eyes:     Extraocular Movements: Extraocular movements intact.  Cardiovascular:     Rate and Rhythm: Normal rate.     Pulses: Normal pulses.  Pulmonary:     Effort: Pulmonary effort is normal.  Abdominal:     General: Abdomen is flat. There is no distension.  Musculoskeletal: Normal range of motion.  Skin:    General: Skin is warm.  Neurological:     General: No focal deficit present.     Mental Status: She is alert and oriented to person, place, and time.  Psychiatric:        Mood and Affect: Mood normal.        Behavior: Behavior normal.        Thought Content: Thought content normal.     Assessment & Plan:  Status post left mastectomy  Type 1 diabetes mellitus with ketoacidosis without coma ([place])  We had a detailed conversation about the patient's options for breast reconstruction. Several reconstruction options were explained to the patient.  It is important to  remember that breast reconstruction is an optional procedure. Reconstruction often requires several stages of surgery and this means more than one operation.  The surgeries are often done several months apart.  The entire process from start to finish can take a year or more. The major goal of breast reconstruction is to look normal in clothing. There will always be scars and a difference noticeable without clothes.  This is true for asymmetries where both breasts will not be identical.  Surgery may be needed or desired to the non-cancerous breast  in order to achieve better symmetry and satisfactory results.  Regardless of the reconstructive method, there is always risks and the possibility that the procedure will fail or have complications.  This couls required additional surgeries.    We discussed the available methods of breast reconstruction and included:  1. Tissue expander with Acellular dermal matrix followed by implant based reconstruction. This can be done as one surgery or multiple surgeries.  2. Autologous reconstruction can include using a muscle or tissue from another area of the body for the reconstruction.  3. Combined procedures like the latissismus dorsi flaps that often uses the muscle with an expander or implant.  For each of the method discussed the risks, benefits, scars and recovery time were discussed in detail. Specific risks included bleeding, infection, hematoma, seroma, scarring, pain, wound healing complications, flap loss, fat necrosis, capsular contracture, need for implant removal, donor site complications, bulge, hernia, umbilical necrosis, need for urgent reoperation, and need for dressing changes.   After the options were discussed we focused on the patient's desires and the procedure that was best for her based on all the information.  A total of 50 minutes of face-to-face time was spent in this encounter, of which >50% was spent in counseling.    Due to her radiation she  is a candidate for a latissimus muscle flap with expander placement followed by an implant.  I do not think an abdominoplasty is a great idea with her medical history.  The absolute safest thing would be just a right breast reduction mastopexy so she could have a smaller bra implant placed on the left.  She still wants to move ahead with breast reconstruction. Patient is hoping to have surgery before the end of the year Pictures were obtained of the patient and placed in the chart with the patient's or guardian's permission. [place], DO

## 2018-12-25 ENCOUNTER — Other Ambulatory Visit: Payer: Self-pay

## 2018-12-25 ENCOUNTER — Ambulatory Visit (INDEPENDENT_AMBULATORY_CARE_PROVIDER_SITE_OTHER): Payer: Commercial Managed Care - PPO | Admitting: Plastic Surgery

## 2018-12-25 VITALS — BP 111/72 | HR 75 | Temp 98.2°F | Ht 65.0 in | Wt 200.0 lb

## 2018-12-25 DIAGNOSIS — Z9012 Acquired absence of left breast and nipple: Secondary | ICD-10-CM

## 2018-12-25 NOTE — Progress Notes (Signed)
Referring Provider Terri Arabian, MD [address] [place],  [place] [id number]   CC: No chief complaint on file. Here to discuss breast reconstruction  [name] is an 48 y.o. female.  HPI: Patient is here to discuss delayed breast reconstruction on the left side.  She had a mastectomy in the 7 years ago followed by chemotherapy and radiation.  At the time she was not a good candidate for reconstruction as her hemoglobin A1c was quite high.  Is taken her some time but most recently her hemoglobin A1c is 7.7.  She has discussed multiple options for reconstruction.  Most recently she saw my partner Dr. Marla [name] in discussed latissimus combined with expander reconstruction.  Allergies  Allergen Reactions  . Hydrocodone-Acetaminophen Itching and Swelling  . Insulin Glargine Itching, Nausea And Vomiting and Swelling  . Nph Iletin I [Insulin Isophane, Mixed Nph] Itching, Nausea And Vomiting and Swelling  . Lantus [Insulin Glargine] Nausea And Vomiting and Swelling    Outpatient Encounter Medications as of 12/25/2018  Medication Sig Note  . atorvastatin (LIPITOR) 10 MG tablet Take 10 mg by mouth daily.   . CONTOUR NEXT TEST test strip USE 1 STRIP TO CHECK GLUCOSE FIVE TIMES DAILY   . ergocalciferol (VITAMIN D2) 50000 units capsule Take 1 capsule (50,000 Units total) by mouth once a week.   . hydrOXYzine (ATARAX/VISTARIL) 25 MG tablet Take 1-2 tablets (25-50 mg total) by mouth every 4 (four) hours as needed.   . insulin detemir (LEVEMIR) 100 UNIT/ML injection Inject 0.15 mLs (15 Units total) into the skin 2 (two) times daily. BACKUP FOR PUMP ONLY   . lisinopril (PRINIVIL,ZESTRIL) 10 MG tablet TAKE ONE TABLET BY MOUTH IN THE MORNING FOR  BLOOD  PRESSURE   . NOVOLOG 100 UNIT/ML injection USE MAX OF 100 UNITS PER DAY WITH PUMP 09/21/2016: Continuous  . triamcinolone cream (KENALOG) 0.1 % Apply 1 application topically 2 (two) times daily. Until rash clears   . venlafaxine XR  (EFFEXOR-XR) 75 MG 24 hr capsule Take 37.5 mg by mouth daily with breakfast.     No facility-administered encounter medications on file as of 12/25/2018.      Past Medical History:  Diagnosis Date  . Allergy   . Blood clot in vein    nonocclusive clot right subclavian vein, on Xarelto for 6 months  . Breast cancer ([place])    T2N2 tiple negative left breast ca   . Breast cancer ([place])   . Breast mass in female    fibrocystic changes  . Cataracts, bilateral   . Diabetes mellitus    insulin dependant - uses pump, Dr. Dwyane Dee  . Family history of breast cancer   . Family history of prostate cancer   . History of chemotherapy    taxotere/cytoxan with neulasta support  . Hyperlipidemia   . Hypertension   . Lymphedema of arm    left upper extremity  . Nausea   . Nausea & vomiting    for approx. 2 weeks  . Personal history of chemotherapy   . Personal history of radiation therapy   . Radiation 08/21/11-10/05/11   Left chestwll/Supraclav./left PAB/left scar  . S/P chemotherapy, time since 4-12 weeks     Past Surgical History:  Procedure Laterality Date  . ABDOMINAL HYSTERECTOMY  2002   still has ovaries   . bladder tacking  01/2009  . left mastectomy  02/21/2011  . MASS EXCISION  02/19/2012   Procedure: MINOR EXCISION OF MASS;  Surgeon: Adin Hector, MD;  Location: [facility];  Service: General;  Laterality: Left;  Marland Kitchen MASTECTOMY MODIFIED RADICAL  02/21/11   left,invasive  multifocal grade III ductal ca,high grade dcis,lymph/vascular invasion invasion (7/14) nodes positive mets  . PORT-A-CATH REMOVAL  10/13/2011   Procedure: MINOR REMOVAL PORT-A-CATH;  Surgeon: Adin Hector, MD;  Location: [facility];  Service: General;  Laterality: Right;  PORT-A CATH REMOVAL  . PORTACATH PLACEMENT  02/21/2011   Procedure: INSERTION PORT-A-CATH;  Surgeon: Adin Hector, MD;  Location: [facility];  Service: General;  Laterality: N/A;  . SHOULDER SURGERY  04/26/10   left    . TUBAL LIGATION  12/1999    Family History  Problem Relation Age of Onset  . Cancer Maternal Aunt        breast  . Breast cancer Maternal Aunt   . Cancer Maternal Uncle 73        right eye removed s/p cancer  . Hypertension Other   . Diabetes Other   . Deep vein thrombosis Other   . Kidney disease Other   . Cancer Maternal Grandfather        prostate ca    Social History   Social History Narrative  . Not on file     Review of Systems General: Denies fevers, chills, weight loss CV: Denies chest pain, shortness of breath, palpitations  Physical Exam Vitals with BMI 12/25/2018 12/13/2018 09/06/2018  Height 5\' 5"  5\' 5"  -  Weight 200 lbs 198 lbs -  BMI [id number] -  Systolic [id number]  Diastolic 72 82 85  Pulse 75 77 82    General:  No acute distress,  Alert and oriented, Non-Toxic, Normal speech and affect Breast: She has a large fairly ptotic breast on the right side.  There is a scar from a Chemo-Port on that side that since been removed.  She has a mastectomy defect on the left side but has what I consider fairly mild skin fibrosis from radiation.  She has a protuberant abdomen.  Assessment/Plan Patient is here to discuss options for left delayed breast reconstruction.  I had a long conversation with her regarding the options.  We discussed DIEP flap, expander based reconstruction, and expander based reconstruction combined with latissimus.  I pointed out that in terms of matching the right side autologous tissue would provide the best match.  Otherwise a latissimus and an expander should give her a relatively safe option as well combined with a reduction on the right side.  Ultimately she seemed interested in discussing the Diep flap further which is a surgery I did not do.  We will plan on arranging consultation with the microvascular reconstructive surgeon who can discuss this more with her.  If she is not considered to be a good candidate or is not interested in free  tissue transfer after that discussion I be happy to move forward with either expander alone or expander combined with latissimus.  Cindra Presume 12/25/2018, 3:31 PM

## 2019-01-14 ENCOUNTER — Inpatient Hospital Stay: Admit: 2019-01-14 | Payer: Commercial Managed Care - PPO | Admitting: Plastic Surgery

## 2019-01-14 SURGERY — RECONSTRUCTION, BREAST, USING LATISSIMUS DORSI MYOCUTANEOUS FLAP
Anesthesia: General | Site: Breast | Laterality: Left

## 2019-01-16 ENCOUNTER — Other Ambulatory Visit: Payer: Self-pay | Admitting: *Deleted

## 2019-01-16 DIAGNOSIS — Z20822 Contact with and (suspected) exposure to covid-19: Secondary | ICD-10-CM

## 2019-01-18 LAB — NOVEL CORONAVIRUS, NAA: SARS-CoV-2, NAA: NOT DETECTED

## 2019-02-05 DIAGNOSIS — Z853 Personal history of malignant neoplasm of breast: Secondary | ICD-10-CM | POA: Diagnosis not present

## 2019-02-17 DIAGNOSIS — Z794 Long term (current) use of insulin: Secondary | ICD-10-CM | POA: Diagnosis not present

## 2019-02-17 DIAGNOSIS — E065 Other chronic thyroiditis: Secondary | ICD-10-CM | POA: Diagnosis not present

## 2019-02-17 DIAGNOSIS — Z7189 Other specified counseling: Secondary | ICD-10-CM | POA: Diagnosis not present

## 2019-02-17 DIAGNOSIS — E1065 Type 1 diabetes mellitus with hyperglycemia: Secondary | ICD-10-CM | POA: Diagnosis not present

## 2019-02-20 DIAGNOSIS — H25013 Cortical age-related cataract, bilateral: Secondary | ICD-10-CM | POA: Diagnosis not present

## 2019-02-20 DIAGNOSIS — H40053 Ocular hypertension, bilateral: Secondary | ICD-10-CM | POA: Diagnosis not present

## 2019-02-20 DIAGNOSIS — H40013 Open angle with borderline findings, low risk, bilateral: Secondary | ICD-10-CM | POA: Diagnosis not present

## 2019-02-25 ENCOUNTER — Other Ambulatory Visit: Payer: Self-pay | Admitting: Family Medicine

## 2019-02-25 DIAGNOSIS — Z1231 Encounter for screening mammogram for malignant neoplasm of breast: Secondary | ICD-10-CM

## 2019-03-21 DIAGNOSIS — E1065 Type 1 diabetes mellitus with hyperglycemia: Secondary | ICD-10-CM | POA: Diagnosis not present

## 2019-03-21 DIAGNOSIS — Z794 Long term (current) use of insulin: Secondary | ICD-10-CM | POA: Diagnosis not present

## 2019-03-28 ENCOUNTER — Other Ambulatory Visit: Payer: Self-pay

## 2019-03-28 ENCOUNTER — Ambulatory Visit
Admission: RE | Admit: 2019-03-28 | Discharge: 2019-03-28 | Disposition: A | Discharge status: Home / Self Care | Payer: PPO | Source: Ambulatory Visit | Attending: Family Medicine | Admitting: Family Medicine

## 2019-03-28 DIAGNOSIS — Z1231 Encounter for screening mammogram for malignant neoplasm of breast: Secondary | ICD-10-CM

## 2019-04-08 DIAGNOSIS — S83241A Other tear of medial meniscus, current injury, right knee, initial encounter: Secondary | ICD-10-CM | POA: Diagnosis not present

## 2019-04-08 DIAGNOSIS — M2241 Chondromalacia patellae, right knee: Secondary | ICD-10-CM | POA: Diagnosis not present

## 2019-04-08 DIAGNOSIS — S63502A Unspecified sprain of left wrist, initial encounter: Secondary | ICD-10-CM | POA: Diagnosis not present

## 2019-04-15 DIAGNOSIS — M25561 Pain in right knee: Secondary | ICD-10-CM | POA: Diagnosis not present

## 2019-04-18 DIAGNOSIS — E065 Other chronic thyroiditis: Secondary | ICD-10-CM | POA: Diagnosis not present

## 2019-04-18 DIAGNOSIS — Z794 Long term (current) use of insulin: Secondary | ICD-10-CM | POA: Diagnosis not present

## 2019-04-18 DIAGNOSIS — E1065 Type 1 diabetes mellitus with hyperglycemia: Secondary | ICD-10-CM | POA: Diagnosis not present

## 2019-04-18 DIAGNOSIS — M25561 Pain in right knee: Secondary | ICD-10-CM | POA: Diagnosis not present

## 2019-04-22 DIAGNOSIS — H524 Presbyopia: Secondary | ICD-10-CM | POA: Diagnosis not present

## 2019-04-22 DIAGNOSIS — H2513 Age-related nuclear cataract, bilateral: Secondary | ICD-10-CM | POA: Diagnosis not present

## 2019-04-22 DIAGNOSIS — E119 Type 2 diabetes mellitus without complications: Secondary | ICD-10-CM | POA: Diagnosis not present

## 2019-04-22 DIAGNOSIS — H25013 Cortical age-related cataract, bilateral: Secondary | ICD-10-CM | POA: Diagnosis not present

## 2019-05-07 DIAGNOSIS — M25561 Pain in right knee: Secondary | ICD-10-CM | POA: Diagnosis not present

## 2019-05-07 DIAGNOSIS — M2241 Chondromalacia patellae, right knee: Secondary | ICD-10-CM | POA: Diagnosis not present

## 2019-05-07 DIAGNOSIS — M6281 Muscle weakness (generalized): Secondary | ICD-10-CM | POA: Diagnosis not present

## 2019-05-07 DIAGNOSIS — M25661 Stiffness of right knee, not elsewhere classified: Secondary | ICD-10-CM | POA: Diagnosis not present

## 2019-05-09 DIAGNOSIS — M6281 Muscle weakness (generalized): Secondary | ICD-10-CM | POA: Diagnosis not present

## 2019-05-09 DIAGNOSIS — M2241 Chondromalacia patellae, right knee: Secondary | ICD-10-CM | POA: Diagnosis not present

## 2019-05-09 DIAGNOSIS — M25561 Pain in right knee: Secondary | ICD-10-CM | POA: Diagnosis not present

## 2019-05-09 DIAGNOSIS — M25661 Stiffness of right knee, not elsewhere classified: Secondary | ICD-10-CM | POA: Diagnosis not present

## 2019-05-12 DIAGNOSIS — M2241 Chondromalacia patellae, right knee: Secondary | ICD-10-CM | POA: Diagnosis not present

## 2019-05-12 DIAGNOSIS — M6281 Muscle weakness (generalized): Secondary | ICD-10-CM | POA: Diagnosis not present

## 2019-05-12 DIAGNOSIS — M25661 Stiffness of right knee, not elsewhere classified: Secondary | ICD-10-CM | POA: Diagnosis not present

## 2019-05-12 DIAGNOSIS — M25561 Pain in right knee: Secondary | ICD-10-CM | POA: Diagnosis not present

## 2019-05-14 DIAGNOSIS — M2241 Chondromalacia patellae, right knee: Secondary | ICD-10-CM | POA: Diagnosis not present

## 2019-05-14 DIAGNOSIS — M25561 Pain in right knee: Secondary | ICD-10-CM | POA: Diagnosis not present

## 2019-05-14 DIAGNOSIS — M25661 Stiffness of right knee, not elsewhere classified: Secondary | ICD-10-CM | POA: Diagnosis not present

## 2019-05-14 DIAGNOSIS — M6281 Muscle weakness (generalized): Secondary | ICD-10-CM | POA: Diagnosis not present

## 2019-05-19 DIAGNOSIS — M25561 Pain in right knee: Secondary | ICD-10-CM | POA: Diagnosis not present

## 2019-05-19 DIAGNOSIS — M6281 Muscle weakness (generalized): Secondary | ICD-10-CM | POA: Diagnosis not present

## 2019-05-19 DIAGNOSIS — M2241 Chondromalacia patellae, right knee: Secondary | ICD-10-CM | POA: Diagnosis not present

## 2019-05-19 DIAGNOSIS — M25661 Stiffness of right knee, not elsewhere classified: Secondary | ICD-10-CM | POA: Diagnosis not present

## 2019-05-22 DIAGNOSIS — M25661 Stiffness of right knee, not elsewhere classified: Secondary | ICD-10-CM | POA: Diagnosis not present

## 2019-05-22 DIAGNOSIS — M25561 Pain in right knee: Secondary | ICD-10-CM | POA: Diagnosis not present

## 2019-05-22 DIAGNOSIS — M6281 Muscle weakness (generalized): Secondary | ICD-10-CM | POA: Diagnosis not present

## 2019-05-22 DIAGNOSIS — M2241 Chondromalacia patellae, right knee: Secondary | ICD-10-CM | POA: Diagnosis not present

## 2019-05-26 DIAGNOSIS — M25661 Stiffness of right knee, not elsewhere classified: Secondary | ICD-10-CM | POA: Diagnosis not present

## 2019-05-26 DIAGNOSIS — M2241 Chondromalacia patellae, right knee: Secondary | ICD-10-CM | POA: Diagnosis not present

## 2019-05-26 DIAGNOSIS — M25561 Pain in right knee: Secondary | ICD-10-CM | POA: Diagnosis not present

## 2019-05-26 DIAGNOSIS — M6281 Muscle weakness (generalized): Secondary | ICD-10-CM | POA: Diagnosis not present

## 2019-05-28 DIAGNOSIS — M25561 Pain in right knee: Secondary | ICD-10-CM | POA: Diagnosis not present

## 2019-05-28 DIAGNOSIS — M2241 Chondromalacia patellae, right knee: Secondary | ICD-10-CM | POA: Diagnosis not present

## 2019-05-28 DIAGNOSIS — M6281 Muscle weakness (generalized): Secondary | ICD-10-CM | POA: Diagnosis not present

## 2019-05-28 DIAGNOSIS — M25661 Stiffness of right knee, not elsewhere classified: Secondary | ICD-10-CM | POA: Diagnosis not present

## 2019-05-29 DIAGNOSIS — H2511 Age-related nuclear cataract, right eye: Secondary | ICD-10-CM | POA: Diagnosis not present

## 2019-05-29 DIAGNOSIS — H25011 Cortical age-related cataract, right eye: Secondary | ICD-10-CM | POA: Diagnosis not present

## 2019-05-29 DIAGNOSIS — H25811 Combined forms of age-related cataract, right eye: Secondary | ICD-10-CM | POA: Diagnosis not present

## 2019-05-30 DIAGNOSIS — F332 Major depressive disorder, recurrent severe without psychotic features: Secondary | ICD-10-CM | POA: Diagnosis not present

## 2019-05-30 DIAGNOSIS — Z794 Long term (current) use of insulin: Secondary | ICD-10-CM | POA: Diagnosis not present

## 2019-05-30 DIAGNOSIS — E065 Other chronic thyroiditis: Secondary | ICD-10-CM | POA: Diagnosis not present

## 2019-05-30 DIAGNOSIS — E1065 Type 1 diabetes mellitus with hyperglycemia: Secondary | ICD-10-CM | POA: Diagnosis not present

## 2019-05-30 DIAGNOSIS — F411 Generalized anxiety disorder: Secondary | ICD-10-CM | POA: Diagnosis not present

## 2019-06-09 DIAGNOSIS — I10 Essential (primary) hypertension: Secondary | ICD-10-CM | POA: Diagnosis not present

## 2019-06-09 DIAGNOSIS — Z6836 Body mass index (BMI) 36.0-36.9, adult: Secondary | ICD-10-CM | POA: Diagnosis not present

## 2019-06-09 DIAGNOSIS — Z853 Personal history of malignant neoplasm of breast: Secondary | ICD-10-CM | POA: Diagnosis not present

## 2019-06-09 DIAGNOSIS — E782 Mixed hyperlipidemia: Secondary | ICD-10-CM | POA: Diagnosis not present

## 2019-06-13 DIAGNOSIS — M25661 Stiffness of right knee, not elsewhere classified: Secondary | ICD-10-CM | POA: Diagnosis not present

## 2019-06-13 DIAGNOSIS — M25561 Pain in right knee: Secondary | ICD-10-CM | POA: Diagnosis not present

## 2019-06-13 DIAGNOSIS — M2241 Chondromalacia patellae, right knee: Secondary | ICD-10-CM | POA: Diagnosis not present

## 2019-06-13 DIAGNOSIS — M6281 Muscle weakness (generalized): Secondary | ICD-10-CM | POA: Diagnosis not present

## 2019-06-16 DIAGNOSIS — M2241 Chondromalacia patellae, right knee: Secondary | ICD-10-CM | POA: Diagnosis not present

## 2019-06-18 DIAGNOSIS — M2241 Chondromalacia patellae, right knee: Secondary | ICD-10-CM | POA: Diagnosis not present

## 2019-06-18 DIAGNOSIS — M6281 Muscle weakness (generalized): Secondary | ICD-10-CM | POA: Diagnosis not present

## 2019-06-18 DIAGNOSIS — M25661 Stiffness of right knee, not elsewhere classified: Secondary | ICD-10-CM | POA: Diagnosis not present

## 2019-06-18 DIAGNOSIS — M25561 Pain in right knee: Secondary | ICD-10-CM | POA: Diagnosis not present

## 2019-06-26 DIAGNOSIS — H25812 Combined forms of age-related cataract, left eye: Secondary | ICD-10-CM | POA: Diagnosis not present

## 2019-06-26 DIAGNOSIS — H25012 Cortical age-related cataract, left eye: Secondary | ICD-10-CM | POA: Diagnosis not present

## 2019-06-26 DIAGNOSIS — H2512 Age-related nuclear cataract, left eye: Secondary | ICD-10-CM | POA: Diagnosis not present

## 2019-06-26 DIAGNOSIS — F332 Major depressive disorder, recurrent severe without psychotic features: Secondary | ICD-10-CM | POA: Diagnosis not present

## 2019-06-26 DIAGNOSIS — F411 Generalized anxiety disorder: Secondary | ICD-10-CM | POA: Diagnosis not present

## 2019-07-10 DIAGNOSIS — Z6836 Body mass index (BMI) 36.0-36.9, adult: Secondary | ICD-10-CM | POA: Diagnosis not present

## 2019-07-10 DIAGNOSIS — I10 Essential (primary) hypertension: Secondary | ICD-10-CM | POA: Diagnosis not present

## 2019-07-24 DIAGNOSIS — I10 Essential (primary) hypertension: Secondary | ICD-10-CM | POA: Diagnosis not present

## 2019-07-24 DIAGNOSIS — Z6836 Body mass index (BMI) 36.0-36.9, adult: Secondary | ICD-10-CM | POA: Diagnosis not present

## 2019-07-25 DIAGNOSIS — H35351 Cystoid macular degeneration, right eye: Secondary | ICD-10-CM | POA: Diagnosis not present

## 2019-07-30 DIAGNOSIS — E1042 Type 1 diabetes mellitus with diabetic polyneuropathy: Secondary | ICD-10-CM | POA: Diagnosis not present

## 2019-07-30 DIAGNOSIS — N951 Menopausal and female climacteric states: Secondary | ICD-10-CM | POA: Diagnosis not present

## 2019-07-30 DIAGNOSIS — R0683 Snoring: Secondary | ICD-10-CM | POA: Diagnosis not present

## 2019-07-30 DIAGNOSIS — I10 Essential (primary) hypertension: Secondary | ICD-10-CM | POA: Diagnosis not present

## 2019-07-30 DIAGNOSIS — F325 Major depressive disorder, single episode, in full remission: Secondary | ICD-10-CM | POA: Diagnosis not present

## 2019-07-30 DIAGNOSIS — E1139 Type 2 diabetes mellitus with other diabetic ophthalmic complication: Secondary | ICD-10-CM | POA: Diagnosis not present

## 2019-07-30 DIAGNOSIS — E7800 Pure hypercholesterolemia, unspecified: Secondary | ICD-10-CM | POA: Diagnosis not present

## 2019-07-30 DIAGNOSIS — E133299 Other specified diabetes mellitus with mild nonproliferative diabetic retinopathy without macular edema, unspecified eye: Secondary | ICD-10-CM | POA: Diagnosis not present

## 2019-07-31 DIAGNOSIS — Z6836 Body mass index (BMI) 36.0-36.9, adult: Secondary | ICD-10-CM | POA: Diagnosis not present

## 2019-07-31 DIAGNOSIS — I10 Essential (primary) hypertension: Secondary | ICD-10-CM | POA: Diagnosis not present

## 2019-08-14 DIAGNOSIS — I10 Essential (primary) hypertension: Secondary | ICD-10-CM | POA: Diagnosis not present

## 2019-08-14 DIAGNOSIS — Z6836 Body mass index (BMI) 36.0-36.9, adult: Secondary | ICD-10-CM | POA: Diagnosis not present

## 2019-08-21 DIAGNOSIS — F332 Major depressive disorder, recurrent severe without psychotic features: Secondary | ICD-10-CM | POA: Diagnosis not present

## 2019-08-21 DIAGNOSIS — F411 Generalized anxiety disorder: Secondary | ICD-10-CM | POA: Diagnosis not present

## 2019-08-21 DIAGNOSIS — Z6836 Body mass index (BMI) 36.0-36.9, adult: Secondary | ICD-10-CM | POA: Diagnosis not present

## 2019-08-25 DIAGNOSIS — E1042 Type 1 diabetes mellitus with diabetic polyneuropathy: Secondary | ICD-10-CM | POA: Diagnosis not present

## 2019-08-26 DIAGNOSIS — G4733 Obstructive sleep apnea (adult) (pediatric): Secondary | ICD-10-CM | POA: Diagnosis not present

## 2019-08-29 DIAGNOSIS — Z794 Long term (current) use of insulin: Secondary | ICD-10-CM | POA: Diagnosis not present

## 2019-08-29 DIAGNOSIS — E1065 Type 1 diabetes mellitus with hyperglycemia: Secondary | ICD-10-CM | POA: Diagnosis not present

## 2019-08-29 DIAGNOSIS — E065 Other chronic thyroiditis: Secondary | ICD-10-CM | POA: Diagnosis not present

## 2019-10-15 DIAGNOSIS — H35351 Cystoid macular degeneration, right eye: Secondary | ICD-10-CM | POA: Diagnosis not present

## 2019-10-15 DIAGNOSIS — Z853 Personal history of malignant neoplasm of breast: Secondary | ICD-10-CM | POA: Diagnosis not present

## 2019-11-10 DIAGNOSIS — Z6836 Body mass index (BMI) 36.0-36.9, adult: Secondary | ICD-10-CM | POA: Diagnosis not present

## 2019-11-10 DIAGNOSIS — E1042 Type 1 diabetes mellitus with diabetic polyneuropathy: Secondary | ICD-10-CM | POA: Diagnosis not present

## 2019-11-10 DIAGNOSIS — I10 Essential (primary) hypertension: Secondary | ICD-10-CM | POA: Diagnosis not present

## 2019-11-10 DIAGNOSIS — E782 Mixed hyperlipidemia: Secondary | ICD-10-CM | POA: Diagnosis not present

## 2019-12-01 DIAGNOSIS — E1065 Type 1 diabetes mellitus with hyperglycemia: Secondary | ICD-10-CM | POA: Diagnosis not present

## 2019-12-01 DIAGNOSIS — Z794 Long term (current) use of insulin: Secondary | ICD-10-CM | POA: Diagnosis not present

## 2019-12-01 DIAGNOSIS — E065 Other chronic thyroiditis: Secondary | ICD-10-CM | POA: Diagnosis not present

## 2020-01-15 DIAGNOSIS — E782 Mixed hyperlipidemia: Secondary | ICD-10-CM | POA: Diagnosis not present

## 2020-01-15 DIAGNOSIS — E1042 Type 1 diabetes mellitus with diabetic polyneuropathy: Secondary | ICD-10-CM | POA: Diagnosis not present

## 2020-01-15 DIAGNOSIS — Z6836 Body mass index (BMI) 36.0-36.9, adult: Secondary | ICD-10-CM | POA: Diagnosis not present

## 2020-01-15 DIAGNOSIS — I10 Essential (primary) hypertension: Secondary | ICD-10-CM | POA: Diagnosis not present

## 2020-01-19 DIAGNOSIS — G4733 Obstructive sleep apnea (adult) (pediatric): Secondary | ICD-10-CM | POA: Diagnosis not present

## 2020-02-02 DIAGNOSIS — E7800 Pure hypercholesterolemia, unspecified: Secondary | ICD-10-CM | POA: Diagnosis not present

## 2020-02-02 DIAGNOSIS — E133299 Other specified diabetes mellitus with mild nonproliferative diabetic retinopathy without macular edema, unspecified eye: Secondary | ICD-10-CM | POA: Diagnosis not present

## 2020-02-02 DIAGNOSIS — E559 Vitamin D deficiency, unspecified: Secondary | ICD-10-CM | POA: Diagnosis not present

## 2020-02-02 DIAGNOSIS — E1042 Type 1 diabetes mellitus with diabetic polyneuropathy: Secondary | ICD-10-CM | POA: Diagnosis not present

## 2020-02-02 DIAGNOSIS — I10 Essential (primary) hypertension: Secondary | ICD-10-CM | POA: Diagnosis not present

## 2020-02-02 DIAGNOSIS — Z0000 Encounter for general adult medical examination without abnormal findings: Secondary | ICD-10-CM | POA: Diagnosis not present

## 2020-02-02 DIAGNOSIS — F331 Major depressive disorder, recurrent, moderate: Secondary | ICD-10-CM | POA: Diagnosis not present

## 2020-02-02 DIAGNOSIS — E1139 Type 2 diabetes mellitus with other diabetic ophthalmic complication: Secondary | ICD-10-CM | POA: Diagnosis not present

## 2020-03-01 DIAGNOSIS — M79672 Pain in left foot: Secondary | ICD-10-CM | POA: Diagnosis not present

## 2020-03-01 DIAGNOSIS — F331 Major depressive disorder, recurrent, moderate: Secondary | ICD-10-CM | POA: Diagnosis not present

## 2020-03-01 DIAGNOSIS — R945 Abnormal results of liver function studies: Secondary | ICD-10-CM | POA: Diagnosis not present

## 2020-07-16 ENCOUNTER — Encounter: Payer: Self-pay | Admitting: Oncology

## 2020-08-10 DIAGNOSIS — E1065 Type 1 diabetes mellitus with hyperglycemia: Secondary | ICD-10-CM | POA: Diagnosis not present

## 2020-08-10 DIAGNOSIS — Z794 Long term (current) use of insulin: Secondary | ICD-10-CM | POA: Diagnosis not present

## 2020-08-10 DIAGNOSIS — E065 Other chronic thyroiditis: Secondary | ICD-10-CM | POA: Diagnosis not present

## 2020-08-31 DIAGNOSIS — E1042 Type 1 diabetes mellitus with diabetic polyneuropathy: Secondary | ICD-10-CM | POA: Diagnosis not present

## 2020-08-31 DIAGNOSIS — E1139 Type 2 diabetes mellitus with other diabetic ophthalmic complication: Secondary | ICD-10-CM | POA: Diagnosis not present

## 2020-08-31 DIAGNOSIS — E133299 Other specified diabetes mellitus with mild nonproliferative diabetic retinopathy without macular edema, unspecified eye: Secondary | ICD-10-CM | POA: Diagnosis not present

## 2020-08-31 DIAGNOSIS — F331 Major depressive disorder, recurrent, moderate: Secondary | ICD-10-CM | POA: Diagnosis not present

## 2020-08-31 DIAGNOSIS — E559 Vitamin D deficiency, unspecified: Secondary | ICD-10-CM | POA: Diagnosis not present

## 2020-08-31 DIAGNOSIS — E7800 Pure hypercholesterolemia, unspecified: Secondary | ICD-10-CM | POA: Diagnosis not present

## 2020-08-31 DIAGNOSIS — R6889 Other general symptoms and signs: Secondary | ICD-10-CM | POA: Diagnosis not present

## 2020-08-31 DIAGNOSIS — I10 Essential (primary) hypertension: Secondary | ICD-10-CM | POA: Diagnosis not present

## 2020-09-28 DIAGNOSIS — M62830 Muscle spasm of back: Secondary | ICD-10-CM | POA: Diagnosis not present

## 2020-09-28 DIAGNOSIS — M9902 Segmental and somatic dysfunction of thoracic region: Secondary | ICD-10-CM | POA: Diagnosis not present

## 2020-09-28 DIAGNOSIS — M9903 Segmental and somatic dysfunction of lumbar region: Secondary | ICD-10-CM | POA: Diagnosis not present

## 2020-09-28 DIAGNOSIS — M62838 Other muscle spasm: Secondary | ICD-10-CM | POA: Diagnosis not present

## 2020-09-28 DIAGNOSIS — M9901 Segmental and somatic dysfunction of cervical region: Secondary | ICD-10-CM | POA: Diagnosis not present

## 2020-09-28 DIAGNOSIS — M545 Low back pain, unspecified: Secondary | ICD-10-CM | POA: Diagnosis not present

## 2020-09-28 DIAGNOSIS — M546 Pain in thoracic spine: Secondary | ICD-10-CM | POA: Diagnosis not present

## 2020-09-28 DIAGNOSIS — M542 Cervicalgia: Secondary | ICD-10-CM | POA: Diagnosis not present

## 2020-10-11 ENCOUNTER — Encounter: Payer: Self-pay | Admitting: Genetic Counselor

## 2020-10-18 DIAGNOSIS — M546 Pain in thoracic spine: Secondary | ICD-10-CM | POA: Diagnosis not present

## 2020-10-18 DIAGNOSIS — M545 Low back pain, unspecified: Secondary | ICD-10-CM | POA: Diagnosis not present

## 2020-10-18 DIAGNOSIS — M9901 Segmental and somatic dysfunction of cervical region: Secondary | ICD-10-CM | POA: Diagnosis not present

## 2020-10-18 DIAGNOSIS — M62838 Other muscle spasm: Secondary | ICD-10-CM | POA: Diagnosis not present

## 2020-10-18 DIAGNOSIS — M9902 Segmental and somatic dysfunction of thoracic region: Secondary | ICD-10-CM | POA: Diagnosis not present

## 2020-10-18 DIAGNOSIS — M9903 Segmental and somatic dysfunction of lumbar region: Secondary | ICD-10-CM | POA: Diagnosis not present

## 2020-10-18 DIAGNOSIS — M62830 Muscle spasm of back: Secondary | ICD-10-CM | POA: Diagnosis not present

## 2020-10-18 DIAGNOSIS — M542 Cervicalgia: Secondary | ICD-10-CM | POA: Diagnosis not present

## 2020-11-02 DIAGNOSIS — N6489 Other specified disorders of breast: Secondary | ICD-10-CM | POA: Diagnosis not present

## 2020-11-02 DIAGNOSIS — Z853 Personal history of malignant neoplasm of breast: Secondary | ICD-10-CM | POA: Diagnosis not present

## 2020-11-08 DIAGNOSIS — E1042 Type 1 diabetes mellitus with diabetic polyneuropathy: Secondary | ICD-10-CM | POA: Diagnosis not present

## 2020-11-08 DIAGNOSIS — Z6836 Body mass index (BMI) 36.0-36.9, adult: Secondary | ICD-10-CM | POA: Diagnosis not present

## 2020-11-08 DIAGNOSIS — Z853 Personal history of malignant neoplasm of breast: Secondary | ICD-10-CM | POA: Diagnosis not present

## 2020-11-08 DIAGNOSIS — I10 Essential (primary) hypertension: Secondary | ICD-10-CM | POA: Diagnosis not present

## 2020-11-09 DIAGNOSIS — E065 Other chronic thyroiditis: Secondary | ICD-10-CM | POA: Diagnosis not present

## 2020-11-09 DIAGNOSIS — Z794 Long term (current) use of insulin: Secondary | ICD-10-CM | POA: Diagnosis not present

## 2020-11-09 DIAGNOSIS — E1065 Type 1 diabetes mellitus with hyperglycemia: Secondary | ICD-10-CM | POA: Diagnosis not present

## 2020-11-09 DIAGNOSIS — E669 Obesity, unspecified: Secondary | ICD-10-CM | POA: Diagnosis not present

## 2020-11-30 DIAGNOSIS — Z853 Personal history of malignant neoplasm of breast: Secondary | ICD-10-CM | POA: Diagnosis not present

## 2020-11-30 DIAGNOSIS — Z08 Encounter for follow-up examination after completed treatment for malignant neoplasm: Secondary | ICD-10-CM | POA: Diagnosis not present

## 2021-01-25 DIAGNOSIS — Z853 Personal history of malignant neoplasm of breast: Secondary | ICD-10-CM | POA: Diagnosis not present

## 2021-01-25 DIAGNOSIS — N6489 Other specified disorders of breast: Secondary | ICD-10-CM | POA: Diagnosis not present

## 2021-02-02 DIAGNOSIS — H5789 Other specified disorders of eye and adnexa: Secondary | ICD-10-CM | POA: Diagnosis not present

## 2021-02-02 DIAGNOSIS — H5711 Ocular pain, right eye: Secondary | ICD-10-CM | POA: Diagnosis not present

## 2021-02-03 DIAGNOSIS — G4733 Obstructive sleep apnea (adult) (pediatric): Secondary | ICD-10-CM | POA: Diagnosis not present

## 2021-02-10 DIAGNOSIS — E669 Obesity, unspecified: Secondary | ICD-10-CM | POA: Diagnosis not present

## 2021-02-10 DIAGNOSIS — Z794 Long term (current) use of insulin: Secondary | ICD-10-CM | POA: Diagnosis not present

## 2021-02-10 DIAGNOSIS — E065 Other chronic thyroiditis: Secondary | ICD-10-CM | POA: Diagnosis not present

## 2021-02-10 DIAGNOSIS — E1065 Type 1 diabetes mellitus with hyperglycemia: Secondary | ICD-10-CM | POA: Diagnosis not present

## 2021-02-16 DIAGNOSIS — Z853 Personal history of malignant neoplasm of breast: Secondary | ICD-10-CM | POA: Diagnosis not present

## 2021-02-21 DIAGNOSIS — I10 Essential (primary) hypertension: Secondary | ICD-10-CM | POA: Diagnosis not present

## 2021-02-21 DIAGNOSIS — E559 Vitamin D deficiency, unspecified: Secondary | ICD-10-CM | POA: Diagnosis not present

## 2021-02-21 DIAGNOSIS — E7800 Pure hypercholesterolemia, unspecified: Secondary | ICD-10-CM | POA: Diagnosis not present

## 2021-03-02 DIAGNOSIS — E669 Obesity, unspecified: Secondary | ICD-10-CM | POA: Diagnosis not present

## 2021-03-02 DIAGNOSIS — E065 Other chronic thyroiditis: Secondary | ICD-10-CM | POA: Diagnosis not present

## 2021-03-02 DIAGNOSIS — Z794 Long term (current) use of insulin: Secondary | ICD-10-CM | POA: Diagnosis not present

## 2021-03-02 DIAGNOSIS — E1065 Type 1 diabetes mellitus with hyperglycemia: Secondary | ICD-10-CM | POA: Diagnosis not present

## 2021-03-30 DIAGNOSIS — E065 Other chronic thyroiditis: Secondary | ICD-10-CM | POA: Diagnosis not present

## 2021-03-30 DIAGNOSIS — E109 Type 1 diabetes mellitus without complications: Secondary | ICD-10-CM | POA: Diagnosis not present

## 2021-03-30 DIAGNOSIS — Z794 Long term (current) use of insulin: Secondary | ICD-10-CM | POA: Diagnosis not present

## 2021-03-30 DIAGNOSIS — E663 Overweight: Secondary | ICD-10-CM | POA: Diagnosis not present

## 2021-03-30 DIAGNOSIS — E1165 Type 2 diabetes mellitus with hyperglycemia: Secondary | ICD-10-CM | POA: Diagnosis not present

## 2021-04-08 DIAGNOSIS — E113293 Type 2 diabetes mellitus with mild nonproliferative diabetic retinopathy without macular edema, bilateral: Secondary | ICD-10-CM | POA: Diagnosis not present

## 2021-05-16 ENCOUNTER — Other Ambulatory Visit: Payer: Self-pay | Admitting: Family Medicine

## 2021-05-16 DIAGNOSIS — Z1231 Encounter for screening mammogram for malignant neoplasm of breast: Secondary | ICD-10-CM

## 2021-05-17 DIAGNOSIS — E6609 Other obesity due to excess calories: Secondary | ICD-10-CM | POA: Diagnosis not present

## 2021-05-17 DIAGNOSIS — Z09 Encounter for follow-up examination after completed treatment for conditions other than malignant neoplasm: Secondary | ICD-10-CM | POA: Diagnosis not present

## 2021-05-17 DIAGNOSIS — E1042 Type 1 diabetes mellitus with diabetic polyneuropathy: Secondary | ICD-10-CM | POA: Diagnosis not present

## 2021-05-23 ENCOUNTER — Ambulatory Visit
Admission: RE | Admit: 2021-05-23 | Discharge: 2021-05-23 | Disposition: A | Discharge status: Home / Self Care | Payer: Commercial Managed Care - PPO | Source: Ambulatory Visit | Attending: Family Medicine | Admitting: Family Medicine

## 2021-05-23 ENCOUNTER — Encounter: Payer: Self-pay | Admitting: Oncology

## 2021-05-23 DIAGNOSIS — Z1231 Encounter for screening mammogram for malignant neoplasm of breast: Secondary | ICD-10-CM

## 2021-05-23 DIAGNOSIS — S83281A Other tear of lateral meniscus, current injury, right knee, initial encounter: Secondary | ICD-10-CM | POA: Diagnosis not present

## 2021-06-01 DIAGNOSIS — Z853 Personal history of malignant neoplasm of breast: Secondary | ICD-10-CM | POA: Diagnosis not present

## 2021-06-01 DIAGNOSIS — N6489 Other specified disorders of breast: Secondary | ICD-10-CM | POA: Diagnosis not present

## 2021-06-01 DIAGNOSIS — C50919 Malignant neoplasm of unspecified site of unspecified female breast: Secondary | ICD-10-CM | POA: Diagnosis not present

## 2021-06-01 DIAGNOSIS — E1042 Type 1 diabetes mellitus with diabetic polyneuropathy: Secondary | ICD-10-CM | POA: Diagnosis not present

## 2021-06-01 DIAGNOSIS — C792 Secondary malignant neoplasm of skin: Secondary | ICD-10-CM | POA: Diagnosis not present

## 2021-06-14 DIAGNOSIS — G4733 Obstructive sleep apnea (adult) (pediatric): Secondary | ICD-10-CM | POA: Diagnosis not present

## 2021-06-14 DIAGNOSIS — I10 Essential (primary) hypertension: Secondary | ICD-10-CM | POA: Diagnosis not present

## 2021-06-14 DIAGNOSIS — E109 Type 1 diabetes mellitus without complications: Secondary | ICD-10-CM | POA: Diagnosis not present

## 2021-06-14 DIAGNOSIS — E785 Hyperlipidemia, unspecified: Secondary | ICD-10-CM | POA: Diagnosis not present

## 2021-06-14 DIAGNOSIS — Z794 Long term (current) use of insulin: Secondary | ICD-10-CM | POA: Diagnosis not present

## 2021-06-14 DIAGNOSIS — Z01818 Encounter for other preprocedural examination: Secondary | ICD-10-CM | POA: Diagnosis not present

## 2021-06-20 DIAGNOSIS — I959 Hypotension, unspecified: Secondary | ICD-10-CM | POA: Diagnosis not present

## 2021-06-20 DIAGNOSIS — I82B11 Acute embolism and thrombosis of right subclavian vein: Secondary | ICD-10-CM | POA: Diagnosis not present

## 2021-06-20 DIAGNOSIS — Z86718 Personal history of other venous thrombosis and embolism: Secondary | ICD-10-CM | POA: Diagnosis not present

## 2021-06-20 DIAGNOSIS — Z853 Personal history of malignant neoplasm of breast: Secondary | ICD-10-CM | POA: Diagnosis not present

## 2021-06-20 DIAGNOSIS — Z421 Encounter for breast reconstruction following mastectomy: Secondary | ICD-10-CM | POA: Diagnosis not present

## 2021-06-20 DIAGNOSIS — E872 Acidosis, unspecified: Secondary | ICD-10-CM | POA: Diagnosis not present

## 2021-06-20 DIAGNOSIS — R0600 Dyspnea, unspecified: Secondary | ICD-10-CM | POA: Diagnosis not present

## 2021-06-20 DIAGNOSIS — Z9012 Acquired absence of left breast and nipple: Secondary | ICD-10-CM | POA: Diagnosis not present

## 2021-06-20 DIAGNOSIS — E8770 Fluid overload, unspecified: Secondary | ICD-10-CM | POA: Diagnosis not present

## 2021-06-20 DIAGNOSIS — Z9011 Acquired absence of right breast and nipple: Secondary | ICD-10-CM | POA: Diagnosis not present

## 2021-06-20 DIAGNOSIS — G4733 Obstructive sleep apnea (adult) (pediatric): Secondary | ICD-10-CM | POA: Diagnosis not present

## 2021-06-20 DIAGNOSIS — E108 Type 1 diabetes mellitus with unspecified complications: Secondary | ICD-10-CM | POA: Diagnosis not present

## 2021-06-20 DIAGNOSIS — D689 Coagulation defect, unspecified: Secondary | ICD-10-CM | POA: Diagnosis not present

## 2021-06-20 DIAGNOSIS — E10649 Type 1 diabetes mellitus with hypoglycemia without coma: Secondary | ICD-10-CM | POA: Diagnosis not present

## 2021-06-20 DIAGNOSIS — E119 Type 2 diabetes mellitus without complications: Secondary | ICD-10-CM | POA: Diagnosis not present

## 2021-06-20 DIAGNOSIS — Z9641 Presence of insulin pump (external) (internal): Secondary | ICD-10-CM | POA: Diagnosis not present

## 2021-06-20 DIAGNOSIS — E878 Other disorders of electrolyte and fluid balance, not elsewhere classified: Secondary | ICD-10-CM | POA: Diagnosis not present

## 2021-06-20 DIAGNOSIS — E10319 Type 1 diabetes mellitus with unspecified diabetic retinopathy without macular edema: Secondary | ICD-10-CM | POA: Diagnosis not present

## 2021-06-20 DIAGNOSIS — Z9221 Personal history of antineoplastic chemotherapy: Secondary | ICD-10-CM | POA: Diagnosis not present

## 2021-06-20 DIAGNOSIS — E785 Hyperlipidemia, unspecified: Secondary | ICD-10-CM | POA: Diagnosis not present

## 2021-06-20 DIAGNOSIS — I10 Essential (primary) hypertension: Secondary | ICD-10-CM | POA: Diagnosis not present

## 2021-06-20 DIAGNOSIS — Z923 Personal history of irradiation: Secondary | ICD-10-CM | POA: Diagnosis not present

## 2021-06-20 DIAGNOSIS — R4182 Altered mental status, unspecified: Secondary | ICD-10-CM | POA: Diagnosis not present

## 2021-06-20 DIAGNOSIS — E1065 Type 1 diabetes mellitus with hyperglycemia: Secondary | ICD-10-CM | POA: Diagnosis not present

## 2021-06-20 DIAGNOSIS — C50912 Malignant neoplasm of unspecified site of left female breast: Secondary | ICD-10-CM | POA: Diagnosis not present

## 2021-06-21 DIAGNOSIS — E10319 Type 1 diabetes mellitus with unspecified diabetic retinopathy without macular edema: Secondary | ICD-10-CM | POA: Diagnosis not present

## 2021-06-21 DIAGNOSIS — E10649 Type 1 diabetes mellitus with hypoglycemia without coma: Secondary | ICD-10-CM | POA: Diagnosis not present

## 2021-06-21 DIAGNOSIS — E1065 Type 1 diabetes mellitus with hyperglycemia: Secondary | ICD-10-CM | POA: Diagnosis not present

## 2021-06-21 DIAGNOSIS — E878 Other disorders of electrolyte and fluid balance, not elsewhere classified: Secondary | ICD-10-CM | POA: Diagnosis not present

## 2021-06-21 DIAGNOSIS — E872 Acidosis, unspecified: Secondary | ICD-10-CM | POA: Diagnosis not present

## 2021-06-21 DIAGNOSIS — R4182 Altered mental status, unspecified: Secondary | ICD-10-CM | POA: Diagnosis not present

## 2021-06-21 DIAGNOSIS — Z9641 Presence of insulin pump (external) (internal): Secondary | ICD-10-CM | POA: Diagnosis not present

## 2021-06-21 DIAGNOSIS — D689 Coagulation defect, unspecified: Secondary | ICD-10-CM | POA: Diagnosis not present

## 2021-06-22 DIAGNOSIS — E10319 Type 1 diabetes mellitus with unspecified diabetic retinopathy without macular edema: Secondary | ICD-10-CM | POA: Diagnosis not present

## 2021-06-22 DIAGNOSIS — I959 Hypotension, unspecified: Secondary | ICD-10-CM | POA: Diagnosis not present

## 2021-06-22 DIAGNOSIS — E10649 Type 1 diabetes mellitus with hypoglycemia without coma: Secondary | ICD-10-CM | POA: Diagnosis not present

## 2021-06-22 DIAGNOSIS — Z9641 Presence of insulin pump (external) (internal): Secondary | ICD-10-CM | POA: Diagnosis not present

## 2021-06-23 DIAGNOSIS — E10319 Type 1 diabetes mellitus with unspecified diabetic retinopathy without macular edema: Secondary | ICD-10-CM | POA: Diagnosis not present

## 2021-06-23 DIAGNOSIS — Z9641 Presence of insulin pump (external) (internal): Secondary | ICD-10-CM | POA: Diagnosis not present

## 2021-06-23 DIAGNOSIS — E10649 Type 1 diabetes mellitus with hypoglycemia without coma: Secondary | ICD-10-CM | POA: Diagnosis not present

## 2021-06-24 DIAGNOSIS — E108 Type 1 diabetes mellitus with unspecified complications: Secondary | ICD-10-CM | POA: Diagnosis not present

## 2021-06-24 DIAGNOSIS — E10319 Type 1 diabetes mellitus with unspecified diabetic retinopathy without macular edema: Secondary | ICD-10-CM | POA: Diagnosis not present

## 2021-06-24 DIAGNOSIS — Z9641 Presence of insulin pump (external) (internal): Secondary | ICD-10-CM | POA: Diagnosis not present

## 2021-06-30 DIAGNOSIS — Z7985 Long-term (current) use of injectable non-insulin antidiabetic drugs: Secondary | ICD-10-CM | POA: Diagnosis not present

## 2021-06-30 DIAGNOSIS — E109 Type 1 diabetes mellitus without complications: Secondary | ICD-10-CM | POA: Diagnosis not present

## 2021-06-30 DIAGNOSIS — Z483 Aftercare following surgery for neoplasm: Secondary | ICD-10-CM | POA: Diagnosis not present

## 2021-06-30 DIAGNOSIS — Z9012 Acquired absence of left breast and nipple: Secondary | ICD-10-CM | POA: Diagnosis not present

## 2021-06-30 DIAGNOSIS — Z7901 Long term (current) use of anticoagulants: Secondary | ICD-10-CM | POA: Diagnosis not present

## 2021-06-30 DIAGNOSIS — Z86718 Personal history of other venous thrombosis and embolism: Secondary | ICD-10-CM | POA: Diagnosis not present

## 2021-06-30 DIAGNOSIS — Z9181 History of falling: Secondary | ICD-10-CM | POA: Diagnosis not present

## 2021-06-30 DIAGNOSIS — C792 Secondary malignant neoplasm of skin: Secondary | ICD-10-CM | POA: Diagnosis not present

## 2021-06-30 DIAGNOSIS — C50912 Malignant neoplasm of unspecified site of left female breast: Secondary | ICD-10-CM | POA: Diagnosis not present

## 2021-07-07 DIAGNOSIS — S31109A Unspecified open wound of abdominal wall, unspecified quadrant without penetration into peritoneal cavity, initial encounter: Secondary | ICD-10-CM | POA: Diagnosis not present

## 2021-10-04 DIAGNOSIS — E7800 Pure hypercholesterolemia, unspecified: Secondary | ICD-10-CM | POA: Diagnosis not present

## 2021-10-04 DIAGNOSIS — E559 Vitamin D deficiency, unspecified: Secondary | ICD-10-CM | POA: Diagnosis not present

## 2021-10-04 DIAGNOSIS — I10 Essential (primary) hypertension: Secondary | ICD-10-CM | POA: Diagnosis not present

## 2021-10-24 DIAGNOSIS — E119 Type 2 diabetes mellitus without complications: Secondary | ICD-10-CM | POA: Diagnosis not present

## 2021-10-24 DIAGNOSIS — Z9221 Personal history of antineoplastic chemotherapy: Secondary | ICD-10-CM | POA: Diagnosis not present

## 2021-10-24 DIAGNOSIS — Z886 Allergy status to analgesic agent status: Secondary | ICD-10-CM | POA: Diagnosis not present

## 2021-10-24 DIAGNOSIS — N6001 Solitary cyst of right breast: Secondary | ICD-10-CM | POA: Diagnosis not present

## 2021-10-24 DIAGNOSIS — N62 Hypertrophy of breast: Secondary | ICD-10-CM | POA: Diagnosis not present

## 2021-10-24 DIAGNOSIS — N6031 Fibrosclerosis of right breast: Secondary | ICD-10-CM | POA: Diagnosis not present

## 2021-10-24 DIAGNOSIS — Z794 Long term (current) use of insulin: Secondary | ICD-10-CM | POA: Diagnosis not present

## 2021-10-24 DIAGNOSIS — C50911 Malignant neoplasm of unspecified site of right female breast: Secondary | ICD-10-CM | POA: Diagnosis not present

## 2021-10-24 DIAGNOSIS — C792 Secondary malignant neoplasm of skin: Secondary | ICD-10-CM | POA: Diagnosis not present

## 2021-10-24 DIAGNOSIS — N651 Disproportion of reconstructed breast: Secondary | ICD-10-CM | POA: Diagnosis not present

## 2021-10-24 DIAGNOSIS — D241 Benign neoplasm of right breast: Secondary | ICD-10-CM | POA: Diagnosis not present

## 2021-10-24 DIAGNOSIS — C50919 Malignant neoplasm of unspecified site of unspecified female breast: Secondary | ICD-10-CM | POA: Diagnosis not present

## 2021-10-24 DIAGNOSIS — Z9012 Acquired absence of left breast and nipple: Secondary | ICD-10-CM | POA: Diagnosis not present

## 2021-10-24 DIAGNOSIS — Z853 Personal history of malignant neoplasm of breast: Secondary | ICD-10-CM | POA: Diagnosis not present

## 2022-02-06 DIAGNOSIS — G4733 Obstructive sleep apnea (adult) (pediatric): Secondary | ICD-10-CM | POA: Diagnosis not present

## 2022-02-06 DIAGNOSIS — I10 Essential (primary) hypertension: Secondary | ICD-10-CM | POA: Diagnosis not present

## 2022-03-20 DIAGNOSIS — Z853 Personal history of malignant neoplasm of breast: Secondary | ICD-10-CM | POA: Diagnosis not present

## 2022-03-20 DIAGNOSIS — C50912 Malignant neoplasm of unspecified site of left female breast: Secondary | ICD-10-CM | POA: Diagnosis not present

## 2022-03-27 DIAGNOSIS — Z853 Personal history of malignant neoplasm of breast: Secondary | ICD-10-CM | POA: Diagnosis not present

## 2022-03-27 DIAGNOSIS — C50912 Malignant neoplasm of unspecified site of left female breast: Secondary | ICD-10-CM | POA: Diagnosis not present

## 2022-03-30 DIAGNOSIS — E7800 Pure hypercholesterolemia, unspecified: Secondary | ICD-10-CM | POA: Diagnosis not present

## 2022-03-30 DIAGNOSIS — I10 Essential (primary) hypertension: Secondary | ICD-10-CM | POA: Diagnosis not present

## 2022-03-30 DIAGNOSIS — E559 Vitamin D deficiency, unspecified: Secondary | ICD-10-CM | POA: Diagnosis not present

## 2022-04-13 DIAGNOSIS — E065 Other chronic thyroiditis: Secondary | ICD-10-CM | POA: Diagnosis not present

## 2022-04-13 DIAGNOSIS — E1065 Type 1 diabetes mellitus with hyperglycemia: Secondary | ICD-10-CM | POA: Diagnosis not present

## 2022-04-13 DIAGNOSIS — E669 Obesity, unspecified: Secondary | ICD-10-CM | POA: Diagnosis not present

## 2022-04-13 DIAGNOSIS — Z794 Long term (current) use of insulin: Secondary | ICD-10-CM | POA: Diagnosis not present

## 2022-04-17 DIAGNOSIS — E113393 Type 2 diabetes mellitus with moderate nonproliferative diabetic retinopathy without macular edema, bilateral: Secondary | ICD-10-CM | POA: Diagnosis not present

## 2022-04-17 DIAGNOSIS — H6092 Unspecified otitis externa, left ear: Secondary | ICD-10-CM | POA: Diagnosis not present

## 2022-04-17 DIAGNOSIS — H5203 Hypermetropia, bilateral: Secondary | ICD-10-CM | POA: Diagnosis not present

## 2022-04-17 DIAGNOSIS — H524 Presbyopia: Secondary | ICD-10-CM | POA: Diagnosis not present

## 2022-06-13 DIAGNOSIS — E669 Obesity, unspecified: Secondary | ICD-10-CM | POA: Diagnosis not present

## 2022-06-13 DIAGNOSIS — E065 Other chronic thyroiditis: Secondary | ICD-10-CM | POA: Diagnosis not present

## 2022-06-13 DIAGNOSIS — Z794 Long term (current) use of insulin: Secondary | ICD-10-CM | POA: Diagnosis not present

## 2022-06-13 DIAGNOSIS — E1065 Type 1 diabetes mellitus with hyperglycemia: Secondary | ICD-10-CM | POA: Diagnosis not present

## 2022-07-11 DIAGNOSIS — T162XXA Foreign body in left ear, initial encounter: Secondary | ICD-10-CM | POA: Diagnosis not present

## 2022-07-25 ENCOUNTER — Encounter: Payer: Self-pay | Admitting: Oncology

## 2022-07-25 ENCOUNTER — Emergency Department (HOSPITAL_COMMUNITY): Payer: 59

## 2022-07-25 ENCOUNTER — Emergency Department (HOSPITAL_COMMUNITY)
Admission: EM | Admit: 2022-07-25 | Discharge: 2022-07-25 | Disposition: A | Discharge status: Home / Self Care | Payer: 59 | Attending: Emergency Medicine | Admitting: Emergency Medicine

## 2022-07-25 ENCOUNTER — Encounter (HOSPITAL_COMMUNITY): Payer: Self-pay

## 2022-07-25 ENCOUNTER — Other Ambulatory Visit: Payer: Self-pay

## 2022-07-25 DIAGNOSIS — K529 Noninfective gastroenteritis and colitis, unspecified: Secondary | ICD-10-CM | POA: Diagnosis not present

## 2022-07-25 DIAGNOSIS — R1084 Generalized abdominal pain: Secondary | ICD-10-CM

## 2022-07-25 DIAGNOSIS — Z794 Long term (current) use of insulin: Secondary | ICD-10-CM | POA: Diagnosis not present

## 2022-07-25 DIAGNOSIS — E119 Type 2 diabetes mellitus without complications: Secondary | ICD-10-CM | POA: Diagnosis not present

## 2022-07-25 DIAGNOSIS — Z79899 Other long term (current) drug therapy: Secondary | ICD-10-CM | POA: Diagnosis not present

## 2022-07-25 DIAGNOSIS — I10 Essential (primary) hypertension: Secondary | ICD-10-CM | POA: Diagnosis not present

## 2022-07-25 DIAGNOSIS — R109 Unspecified abdominal pain: Secondary | ICD-10-CM | POA: Diagnosis present

## 2022-07-25 DIAGNOSIS — R1032 Left lower quadrant pain: Secondary | ICD-10-CM | POA: Diagnosis not present

## 2022-07-25 LAB — URINALYSIS, ROUTINE W REFLEX MICROSCOPIC
Glucose, UA: NEGATIVE mg/dL
Hgb urine dipstick: NEGATIVE
Ketones, ur: NEGATIVE mg/dL
Leukocytes,Ua: NEGATIVE
Nitrite: NEGATIVE
Protein, ur: NEGATIVE mg/dL
Specific Gravity, Urine: 1.023 (ref 1.005–1.030)
pH: 6 (ref 5.0–8.0)

## 2022-07-25 LAB — COMPREHENSIVE METABOLIC PANEL
ALT: 35 U/L (ref 0–44)
AST: 34 U/L (ref 15–41)
Albumin: 3.6 g/dL (ref 3.5–5.0)
Alkaline Phosphatase: 117 U/L (ref 38–126)
Anion gap: 10 (ref 5–15)
BUN: 13 mg/dL (ref 6–20)
CO2: 28 mmol/L (ref 22–32)
Calcium: 9.6 mg/dL (ref 8.9–10.3)
Chloride: 103 mmol/L (ref 98–111)
Creatinine, Ser: 0.93 mg/dL (ref 0.44–1.00)
GFR, Estimated: >60 mL/min (ref >60)
Glucose, Bld: 153 mg/dL — ABNORMAL HIGH (ref 70–99)
Potassium: 4.1 mmol/L (ref 3.5–5.1)
Sodium: 141 mmol/L (ref 135–145)
Total Bilirubin: 0.8 mg/dL (ref 0.3–1.2)
Total Protein: 7.2 g/dL (ref 6.5–8.1)

## 2022-07-25 LAB — CBC
HCT: 39.3 % (ref 36.0–46.0)
Hemoglobin: 13.4 g/dL (ref 12.0–15.0)
MCH: 28.6 pg (ref 26.0–34.0)
MCHC: 34.1 g/dL (ref 30.0–36.0)
MCV: 84 fL (ref 80.0–100.0)
Platelets: 289 10*3/uL (ref 150–400)
RBC: 4.68 MIL/uL (ref 3.87–5.11)
RDW: 12.8 % (ref 11.5–15.5)
WBC: 5.2 10*3/uL (ref 4.0–10.5)
nRBC: 0 % (ref 0.0–0.2)

## 2022-07-25 LAB — LIPASE, BLOOD: Lipase: 24 U/L (ref 11–51)

## 2022-07-25 MED ORDER — SODIUM CHLORIDE 0.9 % IV BOLUS
1000.0000 mL | Freq: Once | INTRAVENOUS | Status: AC
  Administered 2022-07-25: 1000 mL via INTRAVENOUS

## 2022-07-25 MED ORDER — ONDANSETRON 4 MG PO TBDP: ORAL_TABLET | ORAL | 0 refills | Status: DC

## 2022-07-25 MED ORDER — IOHEXOL 350 MG/ML SOLN
75.0000 mL | Freq: Once | INTRAVENOUS | Status: AC | PRN
  Administered 2022-07-25: 75 mL via INTRAVENOUS

## 2022-07-25 MED ORDER — ONDANSETRON HCL 4 MG/2ML IJ SOLN
4.0000 mg | Freq: Once | INTRAMUSCULAR | Status: AC
  Administered 2022-07-25: 4 mg via INTRAVENOUS
  Filled 2022-07-25: qty 2

## 2022-07-25 MED ORDER — DICYCLOMINE HCL 10 MG PO CAPS
10.0000 mg | ORAL_CAPSULE | Freq: Once | ORAL | Status: AC
  Administered 2022-07-25: 10 mg via ORAL
  Filled 2022-07-25: qty 1

## 2022-07-25 MED ORDER — ACETAMINOPHEN 500 MG PO TABS
1000.0000 mg | ORAL_TABLET | Freq: Once | ORAL | Status: AC
  Administered 2022-07-25: 1000 mg via ORAL
  Filled 2022-07-25: qty 2

## 2022-07-25 NOTE — ED Provider Notes (Signed)
[place] EMERGENCY DEPARTMENT AT [facility] Provider Note   CSN: [id number] Arrival date & time: 07/25/22  0350     History  Chief Complaint  Patient presents with   Abdominal Pain    [name] is a 52 y.o. female.  Patient is a 52 year old female with a history of diabetes and hypertension who presents with abdominal cramping associate with nausea vomiting and diarrhea.  She woke up at 2 AM this morning with some abdominal cramping.  This was followed by watery diarrhea and nonbloody, nonbilious emesis.  She says it is eased off a little bit now.  She denies any fevers.  No urinary symptoms.  No known sick contacts.       Home Medications Prior to Admission medications   Medication Sig Start Date End Date Taking? Authorizing Provider  ondansetron (ZOFRAN-ODT) 4 MG disintegrating tablet 4mg  ODT q4 hours prn nausea/vomit 07/25/22  Yes Rolan Bucco, MD  atorvastatin (LIPITOR) 10 MG tablet Take 10 mg by mouth daily. 06/01/15   [provider]  CONTOUR NEXT TEST test strip USE 1 STRIP TO CHECK GLUCOSE FIVE TIMES DAILY 07/17/17   [provider]  ergocalciferol (VITAMIN D2) 50000 units capsule Take 1 capsule (50,000 Units total) by mouth once a week. 12/22/16   Loa Socks, NP  hydrOXYzine (ATARAX/VISTARIL) 25 MG tablet Take 1-2 tablets (25-50 mg total) by mouth every 4 (four) hours as needed. 09/06/18   Eustace Moore, MD  insulin detemir (LEVEMIR) 100 UNIT/ML injection Inject 0.15 mLs (15 Units total) into the skin 2 (two) times daily. BACKUP FOR PUMP ONLY 03/23/16   Ozella Rocks, MD  lisinopril (PRINIVIL,ZESTRIL) 10 MG tablet TAKE ONE TABLET BY MOUTH IN THE MORNING FOR  BLOOD  PRESSURE 04/17/14   Nani Ravens, MD  NOVOLOG 100 UNIT/ML injection USE MAX OF 100 UNITS PER DAY WITH PUMP 01/27/16   Dolores Patty C, DO  triamcinolone cream (KENALOG) 0.1 % Apply 1 application topically 2 (two) times daily. Until rash clears 09/06/18    Eustace Moore, MD  venlafaxine XR (EFFEXOR-XR) 75 MG 24 hr capsule Take 37.5 mg by mouth daily with breakfast.     [provider]      Allergies    Hydrocodone-acetaminophen; Insulin glargine; Nph iletin i [insulin isophane, mixed nph]; and Lantus [insulin glargine]    Review of Systems   Review of Systems  Constitutional:  Negative for chills, diaphoresis, fatigue and fever.  HENT:  Negative for congestion, rhinorrhea and sneezing.   Eyes: Negative.   Respiratory:  Negative for cough, chest tightness and shortness of breath.   Cardiovascular:  Negative for chest pain and leg swelling.  Gastrointestinal:  Positive for abdominal pain, diarrhea, nausea and vomiting. Negative for blood in stool.  Genitourinary:  Negative for difficulty urinating, flank pain, frequency and hematuria.  Musculoskeletal:  Negative for arthralgias and back pain.  Skin:  Negative for rash.  Neurological:  Negative for dizziness, speech difficulty, weakness, numbness and headaches.    Physical Exam Updated Vital Signs BP 132/67 (BP Location: Right Arm)   Pulse 72   Temp 98.1 F (36.7 C) (Oral)   Resp 16   SpO2 100%  Physical Exam Constitutional:      Appearance: She is well-developed.  HENT:     Head: Normocephalic and atraumatic.  Eyes:     Pupils: Pupils are equal, round, and reactive to light.  Cardiovascular:     Rate and Rhythm: Normal rate  and regular rhythm.     Heart sounds: Normal heart sounds.  Pulmonary:     Effort: Pulmonary effort is normal. No respiratory distress.     Breath sounds: Normal breath sounds. No wheezing or rales.  Chest:     Chest wall: No tenderness.  Abdominal:     General: Bowel sounds are normal.     Palpations: Abdomen is soft.     Tenderness: There is abdominal tenderness in the left lower quadrant. There is no guarding or rebound.  Musculoskeletal:        General: Normal range of motion.     Cervical back: Normal range of motion and neck  supple.  Lymphadenopathy:     Cervical: No cervical adenopathy.  Skin:    General: Skin is warm and dry.     Findings: No rash.  Neurological:     Mental Status: She is alert and oriented to person, place, and time.     ED Results / Procedures / Treatments   Labs (all labs ordered are listed, but only abnormal results are displayed) Labs Reviewed  COMPREHENSIVE METABOLIC PANEL - Abnormal; Notable for the following components:      Result Value   Glucose, Bld 153 (*)    All other components within normal limits  URINALYSIS, ROUTINE W REFLEX MICROSCOPIC - Abnormal; Notable for the following components:   Color, Urine AMBER (*)    Bilirubin Urine SMALL (*)    All other components within normal limits  LIPASE, BLOOD  CBC    EKG None  Radiology CT ABDOMEN PELVIS W CONTRAST  Result Date: 07/25/2022 CLINICAL DATA:  Left lower quadrant abdominal pain. EXAM: CT ABDOMEN AND PELVIS WITH CONTRAST TECHNIQUE: Multidetector CT imaging of the abdomen and pelvis was performed using the standard protocol following bolus administration of intravenous contrast. RADIATION DOSE REDUCTION: This exam was performed according to the departmental dose-optimization program which includes automated exposure control, adjustment of the mA and/or kV according to patient size and/or use of iterative reconstruction technique. CONTRAST:  75mL OMNIPAQUE IOHEXOL 350 MG/ML SOLN COMPARISON:  March 31, 2015. FINDINGS: Lower chest: No acute abnormality. Hepatobiliary: No focal liver abnormality is seen. No gallstones, gallbladder wall thickening, or biliary dilatation. Pancreas: Unremarkable. No pancreatic ductal dilatation or surrounding inflammatory changes. Spleen: Normal in size without focal abnormality. Adrenals/Urinary Tract: Adrenal glands are unremarkable. Kidneys are normal, without renal calculi, focal lesion, or hydronephrosis. Bladder is unremarkable. Stomach/Bowel: Stomach is within normal limits. Appendix  appears normal. No evidence of bowel wall thickening, distention, or inflammatory changes. Vascular/Lymphatic: No significant vascular findings are present. No enlarged abdominal or pelvic lymph nodes. Reproductive: Status post hysterectomy. No adnexal masses. Other: No abdominal wall hernia or abnormality. No abdominopelvic ascites. Musculoskeletal: No acute or significant osseous findings. IMPRESSION: No acute abnormality seen in the abdomen or pelvis. Electronically Signed   By: Lupita Raider M.D.   On: 07/25/2022 10:29    Procedures Procedures    Medications Ordered in ED Medications  ondansetron (ZOFRAN) injection 4 mg (4 mg Intravenous Given 07/25/22 0814)  sodium chloride 0.9 % bolus 1,000 mL (0 mLs Intravenous Stopped 07/25/22 1024)  acetaminophen (TYLENOL) tablet 1,000 mg (1,000 mg Oral Given 07/25/22 0920)  dicyclomine (BENTYL) capsule 10 mg (10 mg Oral Given 07/25/22 0920)  iohexol (OMNIPAQUE) 350 MG/ML injection 75 mL (75 mLs Intravenous Contrast Given 07/25/22 1014)    ED Course/ Medical Decision Making/ A&P  Medical Decision Making Amount and/or Complexity of Data Reviewed Labs: ordered. Radiology: ordered.  Risk OTC drugs. Prescription drug management.   Patient is a 68 old female who presents with vomiting and diarrhea associated with abdominal cramping.  Her labs are reviewed and are nonconcerning.  No evidence of pancreatitis.  No evidence of hepatitis.  No evidence of UTI.  Suspect viral enteritis, however she had some ongoing pain in her left abdomen.  CT scan was obtained to rule out diverticulitis.  There is no evidence of diverticulitis or other acute abnormality.  She was given IV fluids and antiemetics.  She was also given Bentyl.  She is feeling much better on reexam.  Her abdomen is soft and nonconcerning.  She is able to tolerate oral fluids.  She was discharged home in good condition.  She was given a prescription for Zofran.  She was  advised to use a bland diet for the next 24 hours.  Return precautions were given.  Final Clinical Impression(s) / ED Diagnoses Final diagnoses:  Generalized abdominal pain  Gastroenteritis    Rx / DC Orders ED Discharge Orders          Ordered    ondansetron (ZOFRAN-ODT) 4 MG disintegrating tablet        07/25/22 1054              Rolan Bucco, MD 07/25/22 1056

## 2022-07-25 NOTE — ED Triage Notes (Signed)
Pt arrived from home via [facility] c/o abd pain 10/10 across entire lower abd. Pt states that the cramping began at 2 am followed by emesis x5 and diarrhea x 5.

## 2022-07-25 NOTE — ED Notes (Signed)
Patient transported to CT 

## 2022-09-12 DIAGNOSIS — E065 Other chronic thyroiditis: Secondary | ICD-10-CM | POA: Diagnosis not present

## 2022-09-12 DIAGNOSIS — E1065 Type 1 diabetes mellitus with hyperglycemia: Secondary | ICD-10-CM | POA: Diagnosis not present

## 2022-09-12 DIAGNOSIS — E669 Obesity, unspecified: Secondary | ICD-10-CM | POA: Diagnosis not present

## 2022-09-12 DIAGNOSIS — Z794 Long term (current) use of insulin: Secondary | ICD-10-CM | POA: Diagnosis not present

## 2022-09-14 DIAGNOSIS — H60392 Other infective otitis externa, left ear: Secondary | ICD-10-CM | POA: Diagnosis not present

## 2022-09-20 DIAGNOSIS — Z853 Personal history of malignant neoplasm of breast: Secondary | ICD-10-CM | POA: Diagnosis not present

## 2022-09-21 DIAGNOSIS — H60502 Unspecified acute noninfective otitis externa, left ear: Secondary | ICD-10-CM | POA: Diagnosis not present

## 2022-09-21 DIAGNOSIS — L299 Pruritus, unspecified: Secondary | ICD-10-CM | POA: Diagnosis not present

## 2022-10-06 DIAGNOSIS — F331 Major depressive disorder, recurrent, moderate: Secondary | ICD-10-CM | POA: Diagnosis not present

## 2022-10-06 DIAGNOSIS — Z23 Encounter for immunization: Secondary | ICD-10-CM | POA: Diagnosis not present

## 2022-10-06 DIAGNOSIS — I10 Essential (primary) hypertension: Secondary | ICD-10-CM | POA: Diagnosis not present

## 2022-10-06 DIAGNOSIS — I890 Lymphedema, not elsewhere classified: Secondary | ICD-10-CM | POA: Diagnosis not present

## 2022-10-06 DIAGNOSIS — E7800 Pure hypercholesterolemia, unspecified: Secondary | ICD-10-CM | POA: Diagnosis not present

## 2022-10-06 DIAGNOSIS — E1139 Type 2 diabetes mellitus with other diabetic ophthalmic complication: Secondary | ICD-10-CM | POA: Diagnosis not present

## 2022-10-06 DIAGNOSIS — E133299 Other specified diabetes mellitus with mild nonproliferative diabetic retinopathy without macular edema, unspecified eye: Secondary | ICD-10-CM | POA: Diagnosis not present

## 2022-10-06 DIAGNOSIS — E1042 Type 1 diabetes mellitus with diabetic polyneuropathy: Secondary | ICD-10-CM | POA: Diagnosis not present

## 2022-10-23 DIAGNOSIS — E113393 Type 2 diabetes mellitus with moderate nonproliferative diabetic retinopathy without macular edema, bilateral: Secondary | ICD-10-CM | POA: Diagnosis not present

## 2022-10-26 DIAGNOSIS — E103592 Type 1 diabetes mellitus with proliferative diabetic retinopathy without macular edema, left eye: Secondary | ICD-10-CM | POA: Diagnosis not present

## 2022-10-26 DIAGNOSIS — H43812 Vitreous degeneration, left eye: Secondary | ICD-10-CM | POA: Diagnosis not present

## 2022-10-26 DIAGNOSIS — E103511 Type 1 diabetes mellitus with proliferative diabetic retinopathy with macular edema, right eye: Secondary | ICD-10-CM | POA: Diagnosis not present

## 2022-10-26 DIAGNOSIS — H35033 Hypertensive retinopathy, bilateral: Secondary | ICD-10-CM | POA: Diagnosis not present

## 2022-11-16 DIAGNOSIS — E103592 Type 1 diabetes mellitus with proliferative diabetic retinopathy without macular edema, left eye: Secondary | ICD-10-CM | POA: Diagnosis not present

## 2022-11-16 DIAGNOSIS — E103511 Type 1 diabetes mellitus with proliferative diabetic retinopathy with macular edema, right eye: Secondary | ICD-10-CM | POA: Diagnosis not present

## 2022-12-15 DIAGNOSIS — Z794 Long term (current) use of insulin: Secondary | ICD-10-CM | POA: Diagnosis not present

## 2022-12-15 DIAGNOSIS — E065 Other chronic thyroiditis: Secondary | ICD-10-CM | POA: Diagnosis not present

## 2022-12-15 DIAGNOSIS — R748 Abnormal levels of other serum enzymes: Secondary | ICD-10-CM | POA: Diagnosis not present

## 2022-12-15 DIAGNOSIS — E1065 Type 1 diabetes mellitus with hyperglycemia: Secondary | ICD-10-CM | POA: Diagnosis not present

## 2022-12-15 DIAGNOSIS — E669 Obesity, unspecified: Secondary | ICD-10-CM | POA: Diagnosis not present

## 2022-12-22 DIAGNOSIS — C50919 Malignant neoplasm of unspecified site of unspecified female breast: Secondary | ICD-10-CM | POA: Diagnosis not present

## 2022-12-22 DIAGNOSIS — C50912 Malignant neoplasm of unspecified site of left female breast: Secondary | ICD-10-CM | POA: Diagnosis not present

## 2022-12-22 DIAGNOSIS — Z853 Personal history of malignant neoplasm of breast: Secondary | ICD-10-CM | POA: Diagnosis not present

## 2022-12-25 ENCOUNTER — Other Ambulatory Visit: Payer: Self-pay | Admitting: Internal Medicine

## 2022-12-25 DIAGNOSIS — R748 Abnormal levels of other serum enzymes: Secondary | ICD-10-CM

## 2022-12-27 ENCOUNTER — Inpatient Hospital Stay
Admission: RE | Admit: 2022-12-27 | Discharge: 2022-12-27 | Discharge status: Home / Self Care | Payer: PPO | Source: Ambulatory Visit | Attending: Internal Medicine | Admitting: Internal Medicine

## 2022-12-27 DIAGNOSIS — R748 Abnormal levels of other serum enzymes: Secondary | ICD-10-CM

## 2022-12-27 DIAGNOSIS — R945 Abnormal results of liver function studies: Secondary | ICD-10-CM | POA: Diagnosis not present

## 2023-01-02 DIAGNOSIS — N6489 Other specified disorders of breast: Secondary | ICD-10-CM | POA: Diagnosis not present

## 2023-01-02 DIAGNOSIS — Z9889 Other specified postprocedural states: Secondary | ICD-10-CM | POA: Diagnosis not present

## 2023-01-02 DIAGNOSIS — L089 Local infection of the skin and subcutaneous tissue, unspecified: Secondary | ICD-10-CM | POA: Diagnosis not present

## 2023-01-02 DIAGNOSIS — Z86718 Personal history of other venous thrombosis and embolism: Secondary | ICD-10-CM | POA: Diagnosis not present

## 2023-01-02 DIAGNOSIS — Z923 Personal history of irradiation: Secondary | ICD-10-CM | POA: Diagnosis not present

## 2023-01-02 DIAGNOSIS — Z853 Personal history of malignant neoplasm of breast: Secondary | ICD-10-CM | POA: Diagnosis not present

## 2023-01-02 DIAGNOSIS — Z9221 Personal history of antineoplastic chemotherapy: Secondary | ICD-10-CM | POA: Diagnosis not present

## 2023-01-02 DIAGNOSIS — E119 Type 2 diabetes mellitus without complications: Secondary | ICD-10-CM | POA: Diagnosis not present

## 2023-01-02 DIAGNOSIS — L905 Scar conditions and fibrosis of skin: Secondary | ICD-10-CM | POA: Diagnosis not present

## 2023-01-02 DIAGNOSIS — Z9012 Acquired absence of left breast and nipple: Secondary | ICD-10-CM | POA: Diagnosis not present

## 2023-01-18 DIAGNOSIS — H35033 Hypertensive retinopathy, bilateral: Secondary | ICD-10-CM | POA: Diagnosis not present

## 2023-01-18 DIAGNOSIS — H43812 Vitreous degeneration, left eye: Secondary | ICD-10-CM | POA: Diagnosis not present

## 2023-01-18 DIAGNOSIS — E103511 Type 1 diabetes mellitus with proliferative diabetic retinopathy with macular edema, right eye: Secondary | ICD-10-CM | POA: Diagnosis not present

## 2023-01-18 DIAGNOSIS — E103592 Type 1 diabetes mellitus with proliferative diabetic retinopathy without macular edema, left eye: Secondary | ICD-10-CM | POA: Diagnosis not present

## 2023-03-19 DIAGNOSIS — E663 Overweight: Secondary | ICD-10-CM | POA: Diagnosis not present

## 2023-03-19 DIAGNOSIS — E1065 Type 1 diabetes mellitus with hyperglycemia: Secondary | ICD-10-CM | POA: Diagnosis not present

## 2023-03-19 DIAGNOSIS — E065 Other chronic thyroiditis: Secondary | ICD-10-CM | POA: Diagnosis not present

## 2023-03-19 DIAGNOSIS — Z794 Long term (current) use of insulin: Secondary | ICD-10-CM | POA: Diagnosis not present

## 2023-04-04 DIAGNOSIS — E1042 Type 1 diabetes mellitus with diabetic polyneuropathy: Secondary | ICD-10-CM | POA: Diagnosis not present

## 2023-04-04 DIAGNOSIS — Z0000 Encounter for general adult medical examination without abnormal findings: Secondary | ICD-10-CM | POA: Diagnosis not present

## 2023-04-04 DIAGNOSIS — I10 Essential (primary) hypertension: Secondary | ICD-10-CM | POA: Diagnosis not present

## 2023-04-04 DIAGNOSIS — Z853 Personal history of malignant neoplasm of breast: Secondary | ICD-10-CM | POA: Diagnosis not present

## 2023-04-04 DIAGNOSIS — E782 Mixed hyperlipidemia: Secondary | ICD-10-CM | POA: Diagnosis not present

## 2023-04-04 DIAGNOSIS — F331 Major depressive disorder, recurrent, moderate: Secondary | ICD-10-CM | POA: Diagnosis not present

## 2023-04-04 DIAGNOSIS — R5383 Other fatigue: Secondary | ICD-10-CM | POA: Diagnosis not present

## 2023-04-23 DIAGNOSIS — E113393 Type 2 diabetes mellitus with moderate nonproliferative diabetic retinopathy without macular edema, bilateral: Secondary | ICD-10-CM | POA: Diagnosis not present

## 2023-06-18 DIAGNOSIS — E065 Other chronic thyroiditis: Secondary | ICD-10-CM | POA: Diagnosis not present

## 2023-06-18 DIAGNOSIS — E663 Overweight: Secondary | ICD-10-CM | POA: Diagnosis not present

## 2023-06-18 DIAGNOSIS — E1065 Type 1 diabetes mellitus with hyperglycemia: Secondary | ICD-10-CM | POA: Diagnosis not present

## 2023-06-18 DIAGNOSIS — Z794 Long term (current) use of insulin: Secondary | ICD-10-CM | POA: Diagnosis not present

## 2023-08-23 DIAGNOSIS — E103511 Type 1 diabetes mellitus with proliferative diabetic retinopathy with macular edema, right eye: Secondary | ICD-10-CM | POA: Diagnosis not present

## 2023-08-23 DIAGNOSIS — H43812 Vitreous degeneration, left eye: Secondary | ICD-10-CM | POA: Diagnosis not present

## 2023-08-23 DIAGNOSIS — H35033 Hypertensive retinopathy, bilateral: Secondary | ICD-10-CM | POA: Diagnosis not present

## 2023-08-23 DIAGNOSIS — E103592 Type 1 diabetes mellitus with proliferative diabetic retinopathy without macular edema, left eye: Secondary | ICD-10-CM | POA: Diagnosis not present

## 2023-09-06 ENCOUNTER — Emergency Department (HOSPITAL_COMMUNITY)

## 2023-09-06 ENCOUNTER — Observation Stay (HOSPITAL_COMMUNITY)
Admission: EM | Admit: 2023-09-06 | Discharge: 2023-09-07 | Disposition: A | Discharge status: Home / Self Care | Attending: Internal Medicine | Admitting: Internal Medicine

## 2023-09-06 ENCOUNTER — Other Ambulatory Visit: Payer: Self-pay

## 2023-09-06 ENCOUNTER — Encounter (HOSPITAL_COMMUNITY): Payer: Self-pay

## 2023-09-06 DIAGNOSIS — I10 Essential (primary) hypertension: Secondary | ICD-10-CM | POA: Diagnosis not present

## 2023-09-06 DIAGNOSIS — Z6833 Body mass index (BMI) 33.0-33.9, adult: Secondary | ICD-10-CM | POA: Diagnosis not present

## 2023-09-06 DIAGNOSIS — R1111 Vomiting without nausea: Secondary | ICD-10-CM | POA: Diagnosis not present

## 2023-09-06 DIAGNOSIS — R079 Chest pain, unspecified: Secondary | ICD-10-CM | POA: Diagnosis not present

## 2023-09-06 DIAGNOSIS — E876 Hypokalemia: Secondary | ICD-10-CM | POA: Diagnosis not present

## 2023-09-06 DIAGNOSIS — E1010 Type 1 diabetes mellitus with ketoacidosis without coma: Secondary | ICD-10-CM | POA: Diagnosis not present

## 2023-09-06 DIAGNOSIS — N179 Acute kidney failure, unspecified: Secondary | ICD-10-CM | POA: Diagnosis not present

## 2023-09-06 DIAGNOSIS — E785 Hyperlipidemia, unspecified: Secondary | ICD-10-CM | POA: Insufficient documentation

## 2023-09-06 DIAGNOSIS — R739 Hyperglycemia, unspecified: Secondary | ICD-10-CM | POA: Diagnosis not present

## 2023-09-06 DIAGNOSIS — E66811 Obesity, class 1: Secondary | ICD-10-CM | POA: Diagnosis not present

## 2023-09-06 DIAGNOSIS — Z853 Personal history of malignant neoplasm of breast: Secondary | ICD-10-CM | POA: Insufficient documentation

## 2023-09-06 DIAGNOSIS — R000 Tachycardia, unspecified: Secondary | ICD-10-CM | POA: Diagnosis not present

## 2023-09-06 DIAGNOSIS — R110 Nausea: Secondary | ICD-10-CM | POA: Diagnosis not present

## 2023-09-06 LAB — I-STAT VENOUS BLOOD GAS, ED
Acid-base deficit: 7 mmol/L — ABNORMAL HIGH (ref 0.0–2.0)
Bicarbonate: 18.9 mmol/L — ABNORMAL LOW (ref 20.0–28.0)
Calcium, Ion: 1.18 mmol/L (ref 1.15–1.40)
HCT: 37 % (ref 36.0–46.0)
Hemoglobin: 12.6 g/dL (ref 12.0–15.0)
O2 Saturation: 58 %
Potassium: 3.8 mmol/L (ref 3.5–5.1)
Sodium: 137 mmol/L (ref 135–145)
TCO2: 20 mmol/L — ABNORMAL LOW (ref 22–32)
pCO2, Ven: 37 mmHg — ABNORMAL LOW (ref 44–60)
pH, Ven: 7.317 (ref 7.25–7.43)
pO2, Ven: 33 mmHg (ref 32–45)

## 2023-09-06 LAB — URINALYSIS, ROUTINE W REFLEX MICROSCOPIC
Bacteria, UA: NONE SEEN
Bilirubin Urine: NEGATIVE
Glucose, UA: NEGATIVE mg/dL
Hgb urine dipstick: NEGATIVE
Ketones, ur: NEGATIVE mg/dL
Leukocytes,Ua: NEGATIVE
Nitrite: NEGATIVE
Protein, ur: NEGATIVE mg/dL
Specific Gravity, Urine: 1.009 (ref 1.005–1.030)
pH: 6 (ref 5.0–8.0)

## 2023-09-06 LAB — TROPONIN I (HIGH SENSITIVITY): Troponin I (High Sensitivity): 5 ng/L (ref <18)

## 2023-09-06 LAB — BASIC METABOLIC PANEL WITH GFR
Anion gap: 14 (ref 5–15)
BUN: 19 mg/dL (ref 6–20)
CO2: 20 mmol/L — ABNORMAL LOW (ref 22–32)
Calcium: 9.4 mg/dL (ref 8.9–10.3)
Chloride: 103 mmol/L (ref 98–111)
Creatinine, Ser: 0.88 mg/dL (ref 0.44–1.00)
GFR, Estimated: >60 mL/min (ref >60)
Glucose, Bld: 158 mg/dL — ABNORMAL HIGH (ref 70–99)
Potassium: 4.4 mmol/L (ref 3.5–5.1)
Sodium: 137 mmol/L (ref 135–145)

## 2023-09-06 LAB — CBC WITH DIFFERENTIAL/PLATELET
Abs Immature Granulocytes: 0.01 K/uL (ref 0.00–0.07)
Basophils Absolute: 0 K/uL (ref 0.0–0.1)
Basophils Relative: 1 %
Eosinophils Absolute: 0 K/uL (ref 0.0–0.5)
Eosinophils Relative: 0 %
HCT: 39.7 % (ref 36.0–46.0)
Hemoglobin: 13.1 g/dL (ref 12.0–15.0)
Immature Granulocytes: 0 %
Lymphocytes Relative: 11 %
Lymphs Abs: 0.9 K/uL (ref 0.7–4.0)
MCH: 28.9 pg (ref 26.0–34.0)
MCHC: 33 g/dL (ref 30.0–36.0)
MCV: 87.6 fL (ref 80.0–100.0)
Monocytes Absolute: 0.2 K/uL (ref 0.1–1.0)
Monocytes Relative: 3 %
Neutro Abs: 7.2 K/uL (ref 1.7–7.7)
Neutrophils Relative %: 85 %
Platelets: 242 K/uL (ref 150–400)
RBC: 4.53 MIL/uL (ref 3.87–5.11)
RDW: 12.6 % (ref 11.5–15.5)
WBC: 8.4 K/uL (ref 4.0–10.5)
nRBC: 0 % (ref 0.0–0.2)

## 2023-09-06 LAB — COMPREHENSIVE METABOLIC PANEL WITH GFR
ALT: 33 U/L (ref 0–44)
AST: 31 U/L (ref 15–41)
Albumin: 3.9 g/dL (ref 3.5–5.0)
Alkaline Phosphatase: 107 U/L (ref 38–126)
Anion gap: 18 — ABNORMAL HIGH (ref 5–15)
BUN: 27 mg/dL — ABNORMAL HIGH (ref 6–20)
CO2: 16 mmol/L — ABNORMAL LOW (ref 22–32)
Calcium: 9.6 mg/dL (ref 8.9–10.3)
Chloride: 102 mmol/L (ref 98–111)
Creatinine, Ser: 1.28 mg/dL — ABNORMAL HIGH (ref 0.44–1.00)
GFR, Estimated: 50 mL/min — ABNORMAL LOW (ref >60)
Glucose, Bld: 368 mg/dL — ABNORMAL HIGH (ref 70–99)
Potassium: 3.9 mmol/L (ref 3.5–5.1)
Sodium: 136 mmol/L (ref 135–145)
Total Bilirubin: 1.8 mg/dL — ABNORMAL HIGH (ref 0.0–1.2)
Total Protein: 7.2 g/dL (ref 6.5–8.1)

## 2023-09-06 LAB — GLUCOSE, CAPILLARY
Glucose-Capillary: 152 mg/dL — ABNORMAL HIGH (ref 70–99)
Glucose-Capillary: 162 mg/dL — ABNORMAL HIGH (ref 70–99)
Glucose-Capillary: 154 mg/dL — ABNORMAL HIGH (ref 70–99)
Glucose-Capillary: 156 mg/dL — ABNORMAL HIGH (ref 70–99)
Glucose-Capillary: 202 mg/dL — ABNORMAL HIGH (ref 70–99)
Glucose-Capillary: 92 mg/dL (ref 70–99)

## 2023-09-06 LAB — I-STAT CHEM 8, ED
BUN: 28 mg/dL — ABNORMAL HIGH (ref 6–20)
Calcium, Ion: 1.18 mmol/L (ref 1.15–1.40)
Chloride: 104 mmol/L (ref 98–111)
Creatinine, Ser: 0.9 mg/dL (ref 0.44–1.00)
Glucose, Bld: 370 mg/dL — ABNORMAL HIGH (ref 70–99)
HCT: 39 % (ref 36.0–46.0)
Hemoglobin: 13.3 g/dL (ref 12.0–15.0)
Potassium: 3.9 mmol/L (ref 3.5–5.1)
Sodium: 138 mmol/L (ref 135–145)
TCO2: 19 mmol/L — ABNORMAL LOW (ref 22–32)

## 2023-09-06 LAB — CBG MONITORING, ED
Glucose-Capillary: 369 mg/dL — ABNORMAL HIGH (ref 70–99)
Glucose-Capillary: 269 mg/dL — ABNORMAL HIGH (ref 70–99)
Glucose-Capillary: 218 mg/dL — ABNORMAL HIGH (ref 70–99)
Glucose-Capillary: 176 mg/dL — ABNORMAL HIGH (ref 70–99)

## 2023-09-06 LAB — BETA-HYDROXYBUTYRIC ACID: Beta-Hydroxybutyric Acid: 5.33 mmol/L — ABNORMAL HIGH (ref 0.05–0.27)

## 2023-09-06 LAB — HIV ANTIBODY (ROUTINE TESTING W REFLEX): HIV Screen 4th Generation wRfx: NONREACTIVE

## 2023-09-06 MED ORDER — DEXTROSE 50 % IV SOLN: 0.0000 mL | INTRAVENOUS | Status: DC | PRN

## 2023-09-06 MED ORDER — INSULIN PUMP
Freq: Three times a day (TID) | SUBCUTANEOUS | Status: DC
  Filled 2023-09-06: qty 1

## 2023-09-06 MED ORDER — ACETAMINOPHEN 325 MG PO TABS: 650.0000 mg | ORAL_TABLET | Freq: Four times a day (QID) | ORAL | Status: DC | PRN

## 2023-09-06 MED ORDER — DEXTROSE IN LACTATED RINGERS 5 % IV SOLN: INTRAVENOUS | Status: DC

## 2023-09-06 MED ORDER — POTASSIUM CHLORIDE 10 MEQ/100ML IV SOLN
10.0000 meq | INTRAVENOUS | Status: AC
  Administered 2023-09-06 (×2): 10 meq via INTRAVENOUS
  Filled 2023-09-06 (×2): qty 100

## 2023-09-06 MED ORDER — LACTATED RINGERS IV BOLUS
1000.0000 mL | INTRAVENOUS | Status: AC
  Administered 2023-09-06: 1000 mL via INTRAVENOUS

## 2023-09-06 MED ORDER — SODIUM CHLORIDE 0.9% FLUSH
3.0000 mL | Freq: Two times a day (BID) | INTRAVENOUS | Status: DC
  Administered 2023-09-06 – 2023-09-07 (×2): 3 mL via INTRAVENOUS

## 2023-09-06 MED ORDER — LACTATED RINGERS IV SOLN: INTRAVENOUS | Status: DC

## 2023-09-06 MED ORDER — HYDRALAZINE HCL 20 MG/ML IJ SOLN: 10.0000 mg | INTRAMUSCULAR | Status: DC | PRN

## 2023-09-06 MED ORDER — DEXTROSE IN LACTATED RINGERS 5 % IV SOLN: INTRAVENOUS | Status: AC

## 2023-09-06 MED ORDER — ONDANSETRON HCL 4 MG PO TABS: 4.0000 mg | ORAL_TABLET | Freq: Four times a day (QID) | ORAL | Status: DC | PRN

## 2023-09-06 MED ORDER — ONDANSETRON HCL 4 MG/2ML IJ SOLN
4.0000 mg | Freq: Once | INTRAMUSCULAR | Status: AC
  Administered 2023-09-06: 4 mg via INTRAVENOUS
  Filled 2023-09-06: qty 2

## 2023-09-06 MED ORDER — ALBUTEROL SULFATE (2.5 MG/3ML) 0.083% IN NEBU: 2.5000 mg | INHALATION_SOLUTION | Freq: Four times a day (QID) | RESPIRATORY_TRACT | Status: DC | PRN

## 2023-09-06 MED ORDER — ATORVASTATIN CALCIUM 10 MG PO TABS
10.0000 mg | ORAL_TABLET | Freq: Every day | ORAL | Status: DC
  Administered 2023-09-06 – 2023-09-07 (×2): 10 mg via ORAL
  Filled 2023-09-06 (×2): qty 1

## 2023-09-06 MED ORDER — INSULIN REGULAR(HUMAN) IN NACL 100-0.9 UT/100ML-% IV SOLN: INTRAVENOUS | Status: AC

## 2023-09-06 MED ORDER — LACTATED RINGERS IV BOLUS
1000.0000 mL | Freq: Once | INTRAVENOUS | Status: AC
  Administered 2023-09-06: 1000 mL via INTRAVENOUS

## 2023-09-06 MED ORDER — LACTATED RINGERS IV SOLN: INTRAVENOUS | Status: AC

## 2023-09-06 MED ORDER — ONDANSETRON HCL 4 MG/2ML IJ SOLN: 4.0000 mg | Freq: Four times a day (QID) | INTRAMUSCULAR | Status: DC | PRN

## 2023-09-06 MED ORDER — ACETAMINOPHEN 650 MG RE SUPP: 650.0000 mg | Freq: Four times a day (QID) | RECTAL | Status: DC | PRN

## 2023-09-06 MED ORDER — INSULIN REGULAR(HUMAN) IN NACL 100-0.9 UT/100ML-% IV SOLN
INTRAVENOUS | Status: DC
  Administered 2023-09-06: 6.5 [IU]/h via INTRAVENOUS
  Filled 2023-09-06: qty 100

## 2023-09-06 NOTE — H&P (Addendum)
 History and Physical    Patient: [name] FMW:[id number] DOB: [birth year] DOA: 09/06/2023 DOS: the patient was seen and examined on 09/06/2023 PCP: Hugh Charleston, MD (Inactive)  Patient coming from: Home  Chief Complaint:  Chief Complaint  Patient presents with   Hyperglycemia   HPI: [name] is a 53 y.o. female with medical history significant of diabetes mellitus type 1 on insulin  pump, hypertension, hyperlipidemia, and history of breast cancer presents with issues related to her new insulin  pump and symptoms of diabetic ketoacidosis.  She received a new insulin  pump two weeks ago after her old pump went out of warranty. Since starting the new pump, she has experienced issues, including line blockages at night, which required her to switch the infusion set. Despite these changes, her blood sugar levels remained high, with the pump reading 'high' last night despite bolus attempts.  This morning, she switched back to her old pump at 5 AM in an attempt to lower her blood sugar levels. She has symptoms consistent with diabetic ketoacidosis, including nausea, vomiting four to five times without blood, headache, heart racing, and frequent urination. No fever, chills, or discomfort with urination. Her blood sugar was last recorded in the 200s.  She is not on any baseline medication for heart rate or blood pressure, except for Sympra. She has not been in diabetic ketoacidosis for five to six years prior to this episode.  In the emergency department patient was noted to be tachycardic with heart rates elevated up to 119, blood pressures elevated up to 175/89, and all other vital signs maintained.  Labs significant for glucose 368, CO2 16, BUN 27, creatinine 1.28, anion gap 18.  Patient had received 2 L of lactated Ringer 's, potassium chloride  20 mill equivalents, and was placed on insulin  drip per protocol.   Review of Systems: As mentioned in the history of present illness. All other  systems reviewed and are negative. Past Medical History:  Diagnosis Date   Allergy    Blood clot in vein    nonocclusive clot right subclavian vein, on Xarelto  for 6 months   Breast cancer (HCC)    T2N2 tiple negative left breast ca    Breast cancer (HCC)    Breast mass in female    fibrocystic changes   Cataracts, bilateral    Diabetes mellitus    insulin  dependant - uses pump, Dr. Von   Family history of breast cancer    Family history of prostate cancer    History of chemotherapy    taxotere /cytoxan  with neulasta  support   Hyperlipidemia    Hypertension    Lymphedema of arm    left upper extremity   Nausea    Nausea & vomiting    for approx. 2 weeks   Personal history of chemotherapy    Personal history of radiation therapy    Radiation 08/21/11-10/05/11   Left chestwll/Supraclav./left PAB/left scar   S/P chemotherapy, time since 4-12 weeks    Past Surgical History:  Procedure Laterality Date   ABDOMINAL HYSTERECTOMY  2002   still has ovaries    bladder tacking  01/2009   left mastectomy  02/21/2011   MASS EXCISION  02/19/2012   Procedure: MINOR EXCISION OF MASS;  Surgeon: Elon CHRISTELLA Pacini, MD;  Location: [facility];  Service: General;  Laterality: Left;   MASTECTOMY MODIFIED RADICAL  02/21/11   left,invasive  multifocal grade III ductal ca,high grade dcis,lymph/vascular invasion invasion (7/14) nodes positive mets   PORT-A-CATH REMOVAL  10/13/2011   Procedure: MINOR REMOVAL PORT-A-CATH;  Surgeon: Elon CHRISTELLA Pacini, MD;  Location: [facility];  Service: General;  Laterality: Right;  PORT-A CATH REMOVAL   PORTACATH PLACEMENT  02/21/2011   Procedure: INSERTION PORT-A-CATH;  Surgeon: Elon CHRISTELLA Pacini, MD;  Location: [facility] OR;  Service: General;  Laterality: N/A;   SHOULDER SURGERY  04/26/10   left   TUBAL LIGATION  12/1999   Social History:  reports that she has never smoked. She has never used smokeless tobacco. She reports that she does not drink  alcohol and does not use drugs.  Allergies  Allergen Reactions   Nph Iletin I [Insulin  Isophane, Mixed Nph] Itching, Nausea And Vomiting and Swelling   Effexor  [Venlafaxine ] Other (See Comments)    Insomnia    Humulin N [Insulin  Isophane] Itching, Nausea And Vomiting and Swelling    Edema    Lantus [Insulin  Glargine] Nausea And Vomiting and Swelling    Edema    Lortab [Hydrocodone -Acetaminophen ] Itching and Swelling   Wellbutrin  [Bupropion ] Other (See Comments)    Headaches  Insomnia    Zoloft [Sertraline] Other (See Comments)    Insomnia     Family History  Problem Relation Age of Onset   Cancer Maternal Aunt        breast   Breast cancer Maternal Aunt    Cancer Maternal Uncle 50        right eye removed s/p cancer   Hypertension Other    Diabetes Other    Deep vein thrombosis Other    Kidney disease Other    Cancer Maternal Grandfather        prostate ca    Prior to Admission medications   Medication Sig Start Date End Date Taking? Authorizing Provider  atorvastatin  (LIPITOR) 10 MG tablet Take 10 mg by mouth daily. 06/01/15   [provider]  CONTOUR NEXT TEST test strip USE 1 STRIP TO CHECK GLUCOSE FIVE TIMES DAILY 07/17/17   [provider]  ergocalciferol  (VITAMIN D2) 50000 units capsule Take 1 capsule (50,000 Units total) by mouth once a week. 12/22/16   Crawford Morna Pickle, NP  hydrOXYzine  (ATARAX /VISTARIL ) 25 MG tablet Take 1-2 tablets (25-50 mg total) by mouth every 4 (four) hours as needed. 09/06/18   Maranda Jamee Jacob, MD  insulin  detemir (LEVEMIR ) 100 UNIT/ML injection Inject 0.15 mLs (15 Units total) into the skin 2 (two) times daily. BACKUP FOR PUMP ONLY 03/23/16   Remonia Alm PARAS, MD  lisinopril  (PRINIVIL ,ZESTRIL ) 10 MG tablet TAKE ONE TABLET BY MOUTH IN THE MORNING FOR  BLOOD  PRESSURE 04/17/14   Laverne Prentice CHRISTELLA, MD  NOVOLOG  100 UNIT/ML injection USE MAX OF 100 UNITS PER DAY WITH PUMP 01/27/16   Riccio, Angela C, DO  ondansetron   (ZOFRAN -ODT) 4 MG disintegrating tablet 4mg  ODT q4 hours prn nausea/vomit 07/25/22   Lenor Hollering, MD  triamcinolone  cream (KENALOG ) 0.1 % Apply 1 application topically 2 (two) times daily. Until rash clears 09/06/18   Maranda Jamee Jacob, MD  venlafaxine  XR (EFFEXOR -XR) 75 MG 24 hr capsule Take 37.5 mg by mouth daily with breakfast.     [provider]    Physical Exam: Vitals:   09/06/23 1151 09/06/23 1300 09/06/23 1311 09/06/23 1715  BP: 136/80 139/77 (!) 140/68 133/76  Pulse: 98  81 90  Resp: 17  20 13   Temp: 97.9 F (36.6 C)  98.5 F (36.9 C) 98.3 F (36.8 C)  TempSrc: Oral  Oral Oral  SpO2: 100%  99% 98%    Constitutional: Middle-aged female currently in no acute distress Eyes: PERRL, lids and conjunctivae normal ENMT: Mucous membranes are moist.  Normal dentition.  Neck: normal, supple  Respiratory: clear to auscultation bilaterally, no wheezing, no crackles. Normal respiratory effort. No accessory muscle use.  Cardiovascular: Tachycardic, no murmurs / rubs / gallops. No extremity edema. 2+ pedal pulses.  Abdomen: no tenderness, no masses palpated.  Bowel sounds positive.  Musculoskeletal: no clubbing / cyanosis. No joint deformity upper and lower extremities. Good ROM, no contractures. Normal muscle tone.  Skin: no rashes, lesions, ulcers. No induration Neurologic: CN 2-12 grossly intact.  Strength 5/5 in all 4.  Psychiatric: Normal judgment and insight. Alert and oriented x 3. Normal mood.   Data Reviewed:  Reviewed labs, imaging, and pertinent records as documented.  Assessment and Plan:  DKA type I Patient presented with reports of elevated blood sugars with nausea and vomiting after being started on a new insulin  pump.  Initial labs glucose elevated up to 368 with CO2 16, beta hydroxybutyrate 5.33, and anion gap 18.  Urinalysis had not been obtained yet.  Venous pH was noted to be 7.317.  Patient had been ordered 2 L bolus of IV fluids and started on insulin   drip.  Patient reports she has not been in DKA in several years.   - Admit to progressive bed - Hyperglycemia order set utilized - Check urinalysis and hemoglobin A1c - Continue insulin  drip and IV fluids per protocol - Serial BMPs every 4 hours to determine when medically appropriate to transition off insulin  drip back to her prior insulin  pump  Acute kidney injury Patient present with creatinine elevated to 1.28 with BUN 27.  Reported having nausea and vomiting and suspect likely prerenal in nature.  Patient had been bolused 2 L of IV fluids. - Avoid possible nephrotoxic agents - Continue to monitor kidney function  Essential hypertension Blood pressures noted to be elevated up to 175/89. - Held lisinopril  due to AKI - Hydralazine  IV as needed for elevated blood pressures  Hyperlipidemia - Continue atorvastatin   History of breast cancer S/p radical mastectomy and treatment back in 2013  DVT prophylaxis: SCDs Advance Care Planning:   Code Status: Full Code   Consults: Diabetic coordinator  Family Communication:   Severity of Illness: The appropriate patient status for this patient is OBSERVATION. Observation status is judged to be reasonable and necessary in order to provide the required intensity of service to ensure the patient's safety. The patient's presenting symptoms, physical exam findings, and initial radiographic and laboratory data in the context of their medical condition is felt to place them at decreased risk for further clinical deterioration. Furthermore, it is anticipated that the patient will be medically stable for discharge from the hospital within 2 midnights of admission.   Author: Maximino DELENA Sharps, MD 09/06/2023 5:36 PM  For on call review [email or web address].

## 2023-09-06 NOTE — ED Notes (Signed)
 Called CCMD at [employer]

## 2023-09-06 NOTE — ED Notes (Signed)
 Per Dr. Claudene, repeat BMET after LR bolus is complete.

## 2023-09-06 NOTE — ED Notes (Signed)
 Abnormal vbg results to [place] p rn by at

## 2023-09-06 NOTE — ED Triage Notes (Signed)
 Patient BIB EMS from home C/O hyperglycemia. Patient's monitor reading high and EMS reads 471. Patient states that around 0500 she woke up feeling nauseous and had some indigestion. Patient does have a history of DKA and states that these symptoms are similar to what she felt before. Patient states that she recently changed her insulin  pump and was trying to change it back before these symptoms started.

## 2023-09-06 NOTE — Plan of Care (Signed)

## 2023-09-06 NOTE — ED Notes (Signed)
Pt removed insulin pump at this time.

## 2023-09-06 NOTE — ED Provider Notes (Signed)
 [place] EMERGENCY DEPARTMENT AT [facility] Provider Note   CSN: [id number] Arrival date & time: 09/06/23  0800     Patient presents with: Hyperglycemia   [name] is a 53 y.o. female.   HPI 53 year old female with a history of type 1 diabetes with an insulin  pump presents with concern for DKA.  She states that she has a new pump and was having difficulty getting the right amount of insulin  and has been having hyperglycemia all night.  At times her glucose will be unreasonably high and then will come down with some bolus insulin .  However started vomiting last night and has vomited about 4 times.  She is having some feeling of rapid heartbeat but also some pressure in her chest which typically happens with DKA.  No fever, cough, shortness of breath, abdominal pain.  Prior to Admission medications   Medication Sig Start Date End Date Taking? Authorizing Provider  atorvastatin  (LIPITOR) 10 MG tablet Take 10 mg by mouth daily. 06/01/15   [provider]  CONTOUR NEXT TEST test strip USE 1 STRIP TO CHECK GLUCOSE FIVE TIMES DAILY 07/17/17   [provider]  ergocalciferol  (VITAMIN D2) 50000 units capsule Take 1 capsule (50,000 Units total) by mouth once a week. 12/22/16   Crawford Morna Pickle, NP  hydrOXYzine  (ATARAX /VISTARIL ) 25 MG tablet Take 1-2 tablets (25-50 mg total) by mouth every 4 (four) hours as needed. 09/06/18   Maranda Jamee Jacob, MD  insulin  detemir (LEVEMIR ) 100 UNIT/ML injection Inject 0.15 mLs (15 Units total) into the skin 2 (two) times daily. BACKUP FOR PUMP ONLY 03/23/16   Remonia Alm PARAS, MD  lisinopril  (PRINIVIL ,ZESTRIL ) 10 MG tablet TAKE ONE TABLET BY MOUTH IN THE MORNING FOR  BLOOD  PRESSURE 04/17/14   Laverne Prentice HERO, MD  NOVOLOG  100 UNIT/ML injection USE MAX OF 100 UNITS PER DAY WITH PUMP 01/27/16   Riccio, Angela C, DO  ondansetron  (ZOFRAN -ODT) 4 MG disintegrating tablet 4mg  ODT q4 hours prn nausea/vomit 07/25/22   Lenor Hollering, MD   triamcinolone  cream (KENALOG ) 0.1 % Apply 1 application topically 2 (two) times daily. Until rash clears 09/06/18   Maranda Jamee Jacob, MD  venlafaxine  XR (EFFEXOR -XR) 75 MG 24 hr capsule Take 37.5 mg by mouth daily with breakfast.     [provider]    Allergies: Hydrocodone -acetaminophen ; Insulin  glargine; Nph iletin i [insulin  isophane, mixed nph]; and Lantus [insulin  glargine]    Review of Systems  Constitutional:  Negative for fever.  Respiratory:  Negative for cough and shortness of breath.   Cardiovascular:  Positive for chest pain.  Gastrointestinal:  Positive for nausea and vomiting. Negative for abdominal pain.    Updated Vital Signs BP (!) 141/92   Pulse (!) 104   Temp 98.4 F (36.9 C) (Oral)   Resp 16   SpO2 100%   Physical Exam Vitals and nursing note reviewed.  Constitutional:      General: She is not in acute distress.    Appearance: She is well-developed. She is not ill-appearing or diaphoretic.  HENT:     Head: Normocephalic and atraumatic.  Cardiovascular:     Rate and Rhythm: Regular rhythm. Tachycardia present.     Heart sounds: Normal heart sounds.  Pulmonary:     Effort: Pulmonary effort is normal.     Breath sounds: Normal breath sounds.  Abdominal:     General: There is no distension.     Palpations: Abdomen is soft.  Tenderness: There is no abdominal tenderness.  Skin:    General: Skin is warm and dry.  Neurological:     Mental Status: She is alert.     (all labs ordered are listed, but only abnormal results are displayed) Labs Reviewed  COMPREHENSIVE METABOLIC PANEL WITH GFR - Abnormal; Notable for the following components:      Result Value   CO2 16 (*)    Glucose, Bld 368 (*)    BUN 27 (*)    Creatinine, Ser 1.28 (*)    Total Bilirubin 1.8 (*)    GFR, Estimated 50 (*)    Anion gap 18 (*)    All other components within normal limits  CBG MONITORING, ED - Abnormal; Notable for the following components:    Glucose-Capillary 369 (*)    All other components within normal limits  I-STAT CHEM 8, ED - Abnormal; Notable for the following components:   BUN 28 (*)    Glucose, Bld 370 (*)    TCO2 19 (*)    All other components within normal limits  I-STAT VENOUS BLOOD GAS, ED - Abnormal; Notable for the following components:   pCO2, Ven 37.0 (*)    Bicarbonate 18.9 (*)    TCO2 20 (*)    Acid-base deficit 7.0 (*)    All other components within normal limits  CBC WITH DIFFERENTIAL/PLATELET  URINALYSIS, ROUTINE W REFLEX MICROSCOPIC  BETA-HYDROXYBUTYRIC ACID  CBG MONITORING, ED  TROPONIN I (HIGH SENSITIVITY)  TROPONIN I (HIGH SENSITIVITY)    EKG: EKG Interpretation Date/Time:  Thursday September 06 2023 08:09:41 EDT Ventricular Rate:  104 PR Interval:  141 QRS Duration:  97 QT Interval:  346 QTC Calculation: 456 R Axis:   36  Text Interpretation: Sinus tachycardia Probable left atrial enlargement RSR' in V1 or V2, right VCD or RVH no acute ST/T changes similar to 2018 Confirmed by Freddi Hamilton [id number]) on 09/06/2023 8:17:20 AM  Radiology: No results found.   .Critical Care  Performed by: Freddi Hamilton, MD Authorized by: Freddi Hamilton, MD   Critical care provider statement:    Critical care time (minutes):  30   Critical care time was exclusive of:  Separately billable procedures and treating other patients   Critical care was necessary to treat or prevent imminent or life-threatening deterioration of the following conditions:  Endocrine crisis   Critical care was time spent personally by me on the following activities:  Development of treatment plan with patient or surrogate, discussions with consultants, evaluation of patient's response to treatment, examination of patient, ordering and review of laboratory studies, ordering and review of radiographic studies, ordering and performing treatments and interventions, pulse oximetry, re-evaluation of patient's condition and review of old  charts    Medications Ordered in the ED  insulin  regular, human (MYXREDLIN ) 100 units/ 100 mL infusion (has no administration in time range)  lactated ringers  infusion (has no administration in time range)  dextrose  5 % in lactated ringers  infusion (has no administration in time range)  dextrose  50 % solution 0-50 mL (has no administration in time range)  lactated ringers  bolus 1,000 mL (has no administration in time range)  potassium chloride  10 mEq in 100 mL IVPB (has no administration in time range)  lactated ringers  bolus 1,000 mL (1,000 mLs Intravenous New Bag/Given 09/06/23 0838)  ondansetron  (ZOFRAN ) injection 4 mg (4 mg Intravenous Given 09/06/23 0838)  Medical Decision Making Amount and/or Complexity of Data Reviewed Labs: ordered.    Details: Hyperglycemia with DKA, mild Radiology: ordered. ECG/medicine tests: ordered and independent interpretation performed.    Details: No ischemia  Risk Prescription drug management. Decision regarding hospitalization.   Patient presents with DKA.  She is tachycardic but nondistressed.  No altered mental status.  Likely related to her recent difficulty with her new insulin  pump.  She was started on IV fluids and will be started on IV insulin .  Will also get a potassium replacement per protocol.  No signs of an MI or obvious infection on history and workup and exam.  Will need admission, discussed with Dr. Claudene.     Final diagnoses:  Diabetic ketoacidosis without coma associated with type 1 diabetes mellitus [facility])    ED Discharge Orders     None          Freddi Hamilton, MD 09/06/23 1014

## 2023-09-06 NOTE — ED Notes (Signed)
 Pt received from room 19 to yellow zone. Pt has 1 PIV to [facility] infusing K+, insulin  gtt, and LR bolus and maintenance IV fluids. This RN initiated 2nd PIV and moved K+ and LR bolus to PIV in RFA. Insulin  gtt continues to infuse into [facility]. Upon review of chart, pt has not completed ordered IVF bolus. Additional LR bolus initiated to satisfy current orders that have not been completed. Per orders, will initiate appropriate maintenance fluids after boluses are complete.

## 2023-09-06 NOTE — Progress Notes (Signed)
  1900 Insulin  Infusion started at change of shift by dayshift nurse.

## 2023-09-06 NOTE — Inpatient Diabetes Management (Signed)
 Inpatient Diabetes Program Recommendations  AACE/ADA: New Consensus Statement on Inpatient Glycemic Control (2015)  Target Ranges:  Prepandial:   less than 140 mg/dL      Peak postprandial:   less than 180 mg/dL (1-2 hours)      Critically ill patients:  140 - 180 mg/dL   Lab Results  Component Value Date   GLUCAP 152 (H) 09/06/2023   HGBA1C 9.5 (H) 09/23/2016    Review of Glycemic Control  Diabetes history: DM type 1 Medtronic 780 with guardian Twiist insulin  pump/Novolog  Dexcom G7  Basal rate 0.75 units/hour CHO 1:10 Correction 1:40  Current orders for Inpatient glycemic control:  IV insulin /Endotool Dr. Reyes [name] Endocrinologist  Spoke with pt at bed side regarding insulin  pump. Pt is on very new insulin  pump system that just came on the market and is working out the technical kinks in the system working closely with the pump company and insulin  pump trainer. Pt has her old medtronic insulin  pump she will wear until her new pump is adjusted. Pt husband bringing extra pump supplies so she is ready to transition to the insulin  pump from IV insulin . Informed pt to restart a whole new site.   Thanks,  Terri Bull RN, MSN, BC-ADM Inpatient Diabetes Coordinator Team Pager [phone] (8a-5p)

## 2023-09-06 NOTE — ED Notes (Signed)
 Admission assessment completed at this time.    09/06/23 1120   Patient Belongings  Patient/Family advised about valuables policy? Yes  Home Medications No meds brought to hospital  Patient Belongings Kept at bedside  Belongings at Bedside Electronic device(s);Glasses;Purse/Wallet  Bedside: Electronic Device(s) Ipad/tablet;Cellphone      09/06/23 1120  Discharge Planning  Type of Residence Private residence  Living Arrangements Spouse/significant other  Home Care Services No  Support Systems Spouse/significant other  Does the patient have any problems obtaining your medications? No  Does the patient have difficulty doing errands alone such as visiting a doctor's office or shopping? N  Family/patient expects to be discharged to: Private residence  Once discharged, how will the patient get to their discharge location? Family/Friend - Partnered Transport  Has discharge transport plan been identified? Yes  Once discharged, how will the patient get to their follow-up appointment? Family/Friend - Photographer

## 2023-09-07 DIAGNOSIS — E1010 Type 1 diabetes mellitus with ketoacidosis without coma: Secondary | ICD-10-CM | POA: Diagnosis not present

## 2023-09-07 DIAGNOSIS — N179 Acute kidney failure, unspecified: Secondary | ICD-10-CM | POA: Diagnosis not present

## 2023-09-07 LAB — BASIC METABOLIC PANEL WITH GFR
Anion gap: 9 (ref 5–15)
BUN: 12 mg/dL (ref 6–20)
CO2: 26 mmol/L (ref 22–32)
Calcium: 8.9 mg/dL (ref 8.9–10.3)
Chloride: 104 mmol/L (ref 98–111)
Creatinine, Ser: 0.76 mg/dL (ref 0.44–1.00)
GFR, Estimated: >60 mL/min (ref >60)
Glucose, Bld: 75 mg/dL (ref 70–99)
Potassium: 3.1 mmol/L — ABNORMAL LOW (ref 3.5–5.1)
Sodium: 139 mmol/L (ref 135–145)

## 2023-09-07 LAB — CBC
HCT: 32.1 % — ABNORMAL LOW (ref 36.0–46.0)
Hemoglobin: 11.4 g/dL — ABNORMAL LOW (ref 12.0–15.0)
MCH: 29.4 pg (ref 26.0–34.0)
MCHC: 35.5 g/dL (ref 30.0–36.0)
MCV: 82.7 fL (ref 80.0–100.0)
Platelets: 238 K/uL (ref 150–400)
RBC: 3.88 MIL/uL (ref 3.87–5.11)
RDW: 12.8 % (ref 11.5–15.5)
WBC: 7.6 K/uL (ref 4.0–10.5)
nRBC: 0 % (ref 0.0–0.2)

## 2023-09-07 LAB — GLUCOSE, CAPILLARY
Glucose-Capillary: 106 mg/dL — ABNORMAL HIGH (ref 70–99)
Glucose-Capillary: 91 mg/dL (ref 70–99)
Glucose-Capillary: 77 mg/dL (ref 70–99)

## 2023-09-07 LAB — HEMOGLOBIN A1C
Hgb A1c MFr Bld: 6.5 % — ABNORMAL HIGH (ref 4.8–5.6)
Mean Plasma Glucose: 140 mg/dL

## 2023-09-07 MED ORDER — POTASSIUM CHLORIDE CRYS ER 20 MEQ PO TBCR
40.0000 meq | EXTENDED_RELEASE_TABLET | Freq: Once | ORAL | Status: AC
  Administered 2023-09-07: 40 meq via ORAL
  Filled 2023-09-07: qty 2

## 2023-09-07 NOTE — TOC Initial Note (Signed)
 Transition of Care [facility]) - Initial/Assessment Note    Patient Details  Name: [name] MRN: [id number] Date of Birth: 1970/04/27  Transition of Care [facility]) CM/SW Contact:    Nola Devere Hands, RN Phone Number: 09/07/2023, 9:53 AM  Clinical Narrative:                 Case manager verbally reviewed via phone-[phone], code 58 with patient and  documented verbal consent for electronic signature.       Patient Goals and CMS Choice            Expected Discharge Plan and Services         Expected Discharge Date: 09/07/23                                    Prior Living Arrangements/Services                       Activities of Daily Living   ADL Screening (condition at time of admission) Independently performs ADLs?: No Does the patient have a NEW difficulty with bathing/dressing/toileting/self-feeding that is expected to last >3 days?: No Does the patient have a NEW difficulty with getting in/out of bed, walking, or climbing stairs that is expected to last >3 days?: No Does the patient have a NEW difficulty with communication that is expected to last >3 days?: No Is the patient deaf or have difficulty hearing?: No Does the patient have difficulty seeing, even when wearing glasses/contacts?: No Does the patient have difficulty concentrating, remembering, or making decisions?: No  Permission Sought/Granted                  Emotional Assessment              Admission diagnosis:  DKA, type 1 (HCC) [E10.10] Diabetic ketoacidosis without coma associated with type 1 diabetes mellitus (HCC) [E10.10] Patient Active Problem List   Diagnosis Date Noted   DKA, type 1 (HCC) 09/06/2023   History of breast cancer 09/06/2023   Genetic testing 02/27/2017   Family history of breast cancer    Family history of prostate cancer    Type 1 diabetes mellitus with ketoacidosis without coma (HCC)    AKI (acute kidney injury) (HCC)    Vitamin D   deficiency disease 11/23/2014   Change in hearing of right ear 11/09/2014   Essential hypertension 12/04/2012   Status post left mastectomy 10/01/2012   Other and unspecified ovarian cyst 08/12/2012   Diabetic retinopathy (HCC) 07/12/2012   Jaw pain 06/26/2012   Mass of left side of neck 02/06/2012   Malignant neoplasm of female breast (HCC) 05/08/2011   Cancer of left breast (HCC) 01/19/2011   Patellofemoral syndrome, Right 03/10/2010   Nonallopathic lesion of thoracic region 02/15/2010   SOMATIC DYSFUNCTION, RIB CAGE 02/15/2010   Disorder of bursae and tendons in shoulder region 11/09/2009   HOT FLASHES 06/14/2007   Type I diabetes mellitus with complication, uncontrolled 11/16/2006   Hyperlipidemia 11/16/2006   Major depressive disorder, single episode 11/16/2006   PCP:  Hugh Charleston, MD (Inactive) Pharmacy:   [facility] [address] [place] - [address] [place] [address] Phone: [phone] Fax: [phone]     Social Drivers of Health (SDOH) Social History: SDOH Screenings   Food Insecurity: No Food Insecurity (09/06/2023)  Housing: Low Risk  (09/06/2023)  Transportation Needs: No Transportation Needs (  09/06/2023)  Utilities: Not At Risk (09/06/2023)  Financial Resource Strain: Low Risk  (06/23/2021)   Received from [facility]  Tobacco Use: Low Risk  (09/06/2023)   SDOH Interventions:     Readmission Risk Interventions     No data to display

## 2023-09-07 NOTE — Discharge Summary (Signed)
 PATIENT DETAILS Name: [name] Age: 53 y.o. Sex: female Date of Birth: Dec 07, 1970 MRN: [id number]. Admitting Physician: Maximino DELENA Sharps, MD ERE:Zypwhzm, Lamar, MD (Inactive)  Admit Date: 09/06/2023 Discharge date: 09/07/2023  Recommendations for Outpatient Follow-up:  Follow up with PCP in 1-2 weeks Please obtain CMP/CBC in one week  Admitted From:  Home  Disposition: Home   Discharge Condition: good  CODE STATUS:   Code Status: Full Code   Diet recommendation:  Diet Order             Diet - low sodium heart healthy           Diet heart healthy/carb modified Fluid consistency: Thin  Diet effective now                    Brief Summary: 53 year old female with history of DM-1 on insulin  pump-who was admitted with mild DKA-she had switched to a new insulin  pump that was giving her some issues.  It was thought that DKA was related to insulin  pump malfunction.  She was subsequently admitted to the hospitalist service.  Significant studies 8/7>> CXR: No PNA 8/7>> A1c: 6.5  Brief Hospital Course: DKA Resolved-initially on IV insulin -subsequently transition back to her old insulin  pump.  DM-1  Back on her insulin  pump since yesterday evening with stable CBGs.  She will follow-up with her endocrinologist/PCP for further optimization.  She is easily able to manage her insulin  pump-it seems that her old insulin  pump is working fine without any issues.    Hypokalemia Mild Will be repleted prior to discharge.  HLD Statin  HTN Resume lisinopril  as BP now is creeping up.  Class 1 Obesity: Estimated body mass index is 33.28 kg/m as calculated from the following:   Height as of 12/25/18: 5' 5 (1.651 m).   Weight as of 12/25/18: 90.7 kg.    Discharge Diagnoses:  Active Problems:   DKA, type 1 (HCC)   AKI (acute kidney injury) (HCC)   Essential hypertension   Hyperlipidemia   History of breast cancer   Discharge Instructions:  Activity:  As  tolerated   Discharge Instructions     Call MD for:  extreme fatigue   Complete by: As directed    Call MD for:  persistant nausea and vomiting   Complete by: As directed    Diet - low sodium heart healthy   Complete by: As directed    Discharge instructions   Complete by: As directed    Follow with Primary MD  Hugh Lamar, MD (Inactive) in 1-2 weeks  Please get a complete blood count and chemistry panel checked by your Primary MD at your next visit, and again as instructed by your Primary MD.  Get Medicines reviewed and adjusted: Please take all your medications with you for your next visit with your Primary MD  Laboratory/radiological data: Please request your Primary MD to go over all hospital tests and procedure/radiological results at the follow up, please ask your Primary MD to get all Hospital records sent to his/her office.  In some cases, they will be blood work, cultures and biopsy results pending at the time of your discharge. Please request that your primary care M.D. follows up on these results.  Also Note the following: If you experience worsening of your admission symptoms, develop shortness of breath, life threatening emergency, suicidal or homicidal thoughts you must seek medical attention immediately by calling 911 or calling your MD immediately  if symptoms less  severe.  You must read complete instructions/literature along with all the possible adverse reactions/side effects for all the Medicines you take and that have been prescribed to you. Take any new Medicines after you have completely understood and accpet all the possible adverse reactions/side effects.   Do not drive when taking Pain medications or sleeping medications (Benzodaizepines)  Do not take more than prescribed Pain, Sleep and Anxiety Medications. It is not advisable to combine anxiety,sleep and pain medications without talking with your primary care practitioner  Special Instructions: If you  have smoked or chewed Tobacco  in the last 2 yrs please stop smoking, stop any regular Alcohol  and or any Recreational drug use.  Wear Seat belts while driving.  Please note: You were cared for by a hospitalist during your hospital stay. Once you are discharged, your primary care physician will handle any further medical issues. Please note that NO REFILLS for any discharge medications will be authorized once you are discharged, as it is imperative that you return to your primary care physician (or establish a relationship with a primary care physician if you do not have one) for your post hospital discharge needs so that they can reassess your need for medications and monitor your lab values.   Increase activity slowly   Complete by: As directed       Allergies as of 09/07/2023       Reactions   Nph Iletin I [insulin  Isophane, Mixed Nph] Itching, Nausea And Vomiting, Swelling   Effexor  [venlafaxine ] Other (See Comments)   Insomnia    Humulin N [insulin  Isophane] Itching, Nausea And Vomiting, Swelling   Edema    Lantus [insulin  Glargine] Nausea And Vomiting, Swelling   Edema    Lortab [hydrocodone -acetaminophen ] Itching, Swelling   Wellbutrin  [bupropion ] Other (See Comments)   Headaches  Insomnia    Zoloft [sertraline] Other (See Comments)   Insomnia         Medication List     TAKE these medications    atorvastatin  10 MG tablet Commonly known as: LIPITOR Take 10 mg by mouth daily.   ergocalciferol  1.25 MG (50000 UT) capsule Commonly known as: VITAMIN D2 Take 1 capsule (50,000 Units total) by mouth once a week.   lisinopril  10 MG tablet Commonly known as: ZESTRIL  TAKE ONE TABLET BY MOUTH IN THE MORNING FOR  BLOOD  PRESSURE   NovoLOG  100 UNIT/ML injection Generic drug: insulin  aspart USE MAX OF 100 UNITS PER DAY WITH PUMP What changed: See the new instructions.   semaglutide-weight management 2.4 MG/0.75ML Soaj SQ injection Commonly known as: WEGOVY Inject 2.4 mg  into the skin every Monday.        Allergies  Allergen Reactions   Nph Iletin I [Insulin  Isophane, Mixed Nph] Itching, Nausea And Vomiting and Swelling   Effexor  [Venlafaxine ] Other (See Comments)    Insomnia    Humulin N [Insulin  Isophane] Itching, Nausea And Vomiting and Swelling    Edema    Lantus [Insulin  Glargine] Nausea And Vomiting and Swelling    Edema    Lortab [Hydrocodone -Acetaminophen ] Itching and Swelling   Wellbutrin  [Bupropion ] Other (See Comments)    Headaches  Insomnia    Zoloft [Sertraline] Other (See Comments)    Insomnia      Other Procedures/Studies: DG Chest Portable 1 View Result Date: 09/06/2023 CLINICAL DATA:  Chest pain. EXAM: PORTABLE CHEST 1 VIEW COMPARISON:  09/21/2016. FINDINGS: Bilateral lung fields are clear. Bilateral costophrenic angles are clear. Normal cardio-mediastinal silhouette. No acute osseous  abnormalities. The soft tissues are within normal limits. IMPRESSION: No active disease. Electronically Signed   By: Ree Molt M.D.   On: 09/06/2023 10:54     TODAY-DAY OF DISCHARGE:  Subjective:   Lusia Egelhoff today has no headache,no chest abdominal pain,no new weakness tingling or numbness, feels much better wants to go home today.   Objective:   Blood pressure 137/85, pulse 86, temperature 98.4 F (36.9 C), temperature source Oral, resp. rate 13, SpO2 99%. No intake or output data in the 24 hours ending 09/07/23 1010 There were no vitals filed for this visit.  Exam: Awake Alert, Oriented *3, No new F.N deficits, Normal affect [place].AT,PERRAL Supple Neck,No JVD, No cervical lymphadenopathy appriciated.  Symmetrical Chest wall movement, Good air movement bilaterally, CTAB RRR,No Gallops,Rubs or new Murmurs, No Parasternal Heave +ve B.Sounds, Abd Soft, Non tender, No organomegaly appriciated, No rebound -guarding or rigidity. No Cyanosis, Clubbing or edema, No new Rash or bruise   PERTINENT RADIOLOGIC STUDIES: DG Chest Portable 1  View Result Date: 09/06/2023 CLINICAL DATA:  Chest pain. EXAM: PORTABLE CHEST 1 VIEW COMPARISON:  09/21/2016. FINDINGS: Bilateral lung fields are clear. Bilateral costophrenic angles are clear. Normal cardio-mediastinal silhouette. No acute osseous abnormalities. The soft tissues are within normal limits. IMPRESSION: No active disease. Electronically Signed   By: Ree Molt M.D.   On: 09/06/2023 10:54     PERTINENT LAB RESULTS: CBC: Recent Labs    09/06/23 0827 09/06/23 0837 09/07/23 0414  WBC 8.4  --  7.6  HGB 13.1 12.6  13.3 11.4*  HCT 39.7 37.0  39.0 32.1*  PLT 242  --  238   CMET CMP     Component Value Date/Time   NA 139 09/07/2023 0414   NA 142 11/23/2014 0815   K 3.1 (L) 09/07/2023 0414   K 3.7 11/23/2014 0815   CL 104 09/07/2023 0414   CL 107 07/15/2012 1021   CO2 26 09/07/2023 0414   CO2 24 11/23/2014 0815   GLUCOSE 75 09/07/2023 0414   GLUCOSE 237 (H) 11/23/2014 0815   GLUCOSE 150 (H) 07/15/2012 1021   BUN 12 09/07/2023 0414   BUN 11.3 11/23/2014 0815   CREATININE 0.76 09/07/2023 0414   CREATININE 0.99 01/01/2018 1344   CREATININE 0.62 12/31/2014 1149   CREATININE 0.9 11/23/2014 0815   CALCIUM  8.9 09/07/2023 0414   CALCIUM  9.3 11/23/2014 0815   PROT 7.2 09/06/2023 0827   PROT 6.9 11/23/2014 0815   ALBUMIN 3.9 09/06/2023 0827   ALBUMIN 3.5 11/23/2014 0815   AST 31 09/06/2023 0827   AST 25 11/23/2014 0815   ALT 33 09/06/2023 0827   ALT 30 11/23/2014 0815   ALKPHOS 107 09/06/2023 0827   ALKPHOS 160 (H) 11/23/2014 0815   BILITOT 1.8 (H) 09/06/2023 0827   BILITOT 0.71 11/23/2014 0815   GFR 97.01 09/01/2013 1352   EGFR 86 (L) 11/23/2014 0815   GFRNONAA >60 09/07/2023 0414   GFRNONAA >60 01/01/2018 1344   GFRNONAA >89 12/31/2014 1149    GFR CrCl cannot be calculated (Unknown ideal weight.). No results for input(s): LIPASE, AMYLASE in the last 72 hours. No results for input(s): CKTOTAL, CKMB, CKMBINDEX, TROPONINI in the last 72  hours. Invalid input(s): POCBNP No results for input(s): DDIMER in the last 72 hours. Recent Labs    09/06/23 0908  HGBA1C 6.5*   No results for input(s): CHOL, HDL, LDLCALC, TRIG, CHOLHDL, LDLDIRECT in the last 72 hours. No results for input(s): TSH, T4TOTAL, T3FREE, THYROIDAB in the  last 72 hours.  Invalid input(s): FREET3 No results for input(s): VITAMINB12, FOLATE, FERRITIN, TIBC, IRON, RETICCTPCT in the last 72 hours. Coags: No results for input(s): INR in the last 72 hours.  Invalid input(s): PT Microbiology: No results found for this or any previous visit (from the past 240 hours).  FURTHER DISCHARGE INSTRUCTIONS:  Get Medicines reviewed and adjusted: Please take all your medications with you for your next visit with your Primary MD  Laboratory/radiological data: Please request your Primary MD to go over all hospital tests and procedure/radiological results at the follow up, please ask your Primary MD to get all Hospital records sent to his/her office.  In some cases, they will be blood work, cultures and biopsy results pending at the time of your discharge. Please request that your primary care M.D. goes through all the records of your hospital data and follows up on these results.  Also Note the following: If you experience worsening of your admission symptoms, develop shortness of breath, life threatening emergency, suicidal or homicidal thoughts you must seek medical attention immediately by calling 911 or calling your MD immediately  if symptoms less severe.  You must read complete instructions/literature along with all the possible adverse reactions/side effects for all the Medicines you take and that have been prescribed to you. Take any new Medicines after you have completely understood and accpet all the possible adverse reactions/side effects.   Do not drive when taking Pain medications or sleeping medications  (Benzodaizepines)  Do not take more than prescribed Pain, Sleep and Anxiety Medications. It is not advisable to combine anxiety,sleep and pain medications without talking with your primary care practitioner  Special Instructions: If you have smoked or chewed Tobacco  in the last 2 yrs please stop smoking, stop any regular Alcohol  and or any Recreational drug use.  Wear Seat belts while driving.  Please note: You were cared for by a hospitalist during your hospital stay. Once you are discharged, your primary care physician will handle any further medical issues. Please note that NO REFILLS for any discharge medications will be authorized once you are discharged, as it is imperative that you return to your primary care physician (or establish a relationship with a primary care physician if you do not have one) for your post hospital discharge needs so that they can reassess your need for medications and monitor your lab values.  Total Time spent coordinating discharge including counseling, education and face to face time equals greater than 30 minutes.  Signed: Malikiah Debarr 09/07/2023 10:10 AM

## 2023-09-07 NOTE — Care Management CC44 (Signed)
 Condition Code 44 Documentation Completed  Patient Details  Name: [name] MRN: [id number] Date of Birth: October 25, 1970   Condition Code 44 given:  Yes Patient signature on Condition Code 44 notice:  Yes Documentation of 2 MD's agreement:  Yes Code 44 added to claim:  Yes    Nola Devere Hands, RN 09/07/2023, 9:13 AM

## 2023-09-18 ENCOUNTER — Other Ambulatory Visit: Payer: Self-pay | Admitting: Family Medicine

## 2023-09-18 DIAGNOSIS — Z1231 Encounter for screening mammogram for malignant neoplasm of breast: Secondary | ICD-10-CM

## 2023-09-20 DIAGNOSIS — Z8639 Personal history of other endocrine, nutritional and metabolic disease: Secondary | ICD-10-CM | POA: Diagnosis not present

## 2023-09-20 DIAGNOSIS — E1065 Type 1 diabetes mellitus with hyperglycemia: Secondary | ICD-10-CM | POA: Diagnosis not present

## 2023-09-20 DIAGNOSIS — E876 Hypokalemia: Secondary | ICD-10-CM | POA: Diagnosis not present

## 2023-10-02 ENCOUNTER — Ambulatory Visit
Admission: RE | Admit: 2023-10-02 | Discharge: 2023-10-02 | Disposition: A | Discharge status: Home / Self Care | Source: Ambulatory Visit | Attending: Family Medicine | Admitting: Family Medicine

## 2023-10-02 DIAGNOSIS — Z1231 Encounter for screening mammogram for malignant neoplasm of breast: Secondary | ICD-10-CM | POA: Diagnosis not present

## 2023-10-08 DIAGNOSIS — E559 Vitamin D deficiency, unspecified: Secondary | ICD-10-CM | POA: Diagnosis not present

## 2023-10-08 DIAGNOSIS — E782 Mixed hyperlipidemia: Secondary | ICD-10-CM | POA: Diagnosis not present

## 2023-10-17 ENCOUNTER — Encounter (HOSPITAL_COMMUNITY): Payer: Self-pay | Admitting: Emergency Medicine

## 2023-10-17 ENCOUNTER — Emergency Department (HOSPITAL_COMMUNITY)

## 2023-10-17 ENCOUNTER — Emergency Department (HOSPITAL_COMMUNITY)
Admission: EM | Admit: 2023-10-17 | Discharge: 2023-10-18 | Disposition: A | Discharge status: Home / Self Care | Attending: Emergency Medicine | Admitting: Emergency Medicine

## 2023-10-17 ENCOUNTER — Other Ambulatory Visit: Payer: Self-pay

## 2023-10-17 ENCOUNTER — Encounter: Payer: Self-pay | Admitting: Oncology

## 2023-10-17 DIAGNOSIS — I10 Essential (primary) hypertension: Secondary | ICD-10-CM | POA: Diagnosis not present

## 2023-10-17 DIAGNOSIS — Z794 Long term (current) use of insulin: Secondary | ICD-10-CM | POA: Diagnosis not present

## 2023-10-17 DIAGNOSIS — R079 Chest pain, unspecified: Secondary | ICD-10-CM | POA: Diagnosis not present

## 2023-10-17 DIAGNOSIS — E119 Type 2 diabetes mellitus without complications: Secondary | ICD-10-CM | POA: Insufficient documentation

## 2023-10-17 DIAGNOSIS — R0789 Other chest pain: Secondary | ICD-10-CM | POA: Diagnosis present

## 2023-10-17 DIAGNOSIS — Z853 Personal history of malignant neoplasm of breast: Secondary | ICD-10-CM | POA: Insufficient documentation

## 2023-10-17 LAB — BASIC METABOLIC PANEL WITH GFR
Anion gap: 9 (ref 5–15)
BUN: 14 mg/dL (ref 6–20)
CO2: 25 mmol/L (ref 22–32)
Calcium: 9.4 mg/dL (ref 8.9–10.3)
Chloride: 101 mmol/L (ref 98–111)
Creatinine, Ser: 0.82 mg/dL (ref 0.44–1.00)
GFR, Estimated: >60 mL/min (ref >60)
Glucose, Bld: 266 mg/dL — ABNORMAL HIGH (ref 70–99)
Potassium: 3.7 mmol/L (ref 3.5–5.1)
Sodium: 135 mmol/L (ref 135–145)

## 2023-10-17 LAB — CBC
HCT: 37.5 % (ref 36.0–46.0)
Hemoglobin: 12.9 g/dL (ref 12.0–15.0)
MCH: 29.1 pg (ref 26.0–34.0)
MCHC: 34.4 g/dL (ref 30.0–36.0)
MCV: 84.5 fL (ref 80.0–100.0)
Platelets: 273 K/uL (ref 150–400)
RBC: 4.44 MIL/uL (ref 3.87–5.11)
RDW: 12.5 % (ref 11.5–15.5)
WBC: 6.5 K/uL (ref 4.0–10.5)
nRBC: 0 % (ref 0.0–0.2)

## 2023-10-17 LAB — TROPONIN I (HIGH SENSITIVITY): Troponin I (High Sensitivity): 3 ng/L (ref <18)

## 2023-10-17 NOTE — ED Provider Triage Note (Signed)
 Emergency Medicine Provider Triage Evaluation Note  [name] , a 53 y.o. female  was evaluated in triage.  Pt complains of CP onset around 2:30pm while driving, located center of chest, radiates to right and into right upper arm. Constant, nothing makes pain worse. Not associated with nausea, diaphoresis, SHOB. Hx OF HTN, DM. No hx HLD. No relevant family cardiac hx. Non smoker.   Review of Systems  Positive:  Negative:   Physical Exam  BP (!) 155/100 (BP Location: Right Arm)   Pulse 98   Temp 98 F (36.7 C)   Resp 18   Ht 5' 5 (1.651 m)   Wt 91 kg   SpO2 99%   BMI 33.38 kg/m  Gen:   Awake, no distress   Resp:  Normal effort  MSK:   Moves extremities without difficulty  Other:    Medical Decision Making  Medically screening exam initiated at 9:40 PM.  Appropriate orders placed.  [name] was informed that the remainder of the evaluation will be completed by another provider, this initial triage assessment does not replace that evaluation, and the importance of remaining in the ED until their evaluation is complete.     [name] LABOR, PA-C 10/17/23 2142

## 2023-10-17 NOTE — ED Triage Notes (Signed)
 Patient reports chest tightness and pressure since 3pm today, radiating to right arm pit and back. Patient reports no cardiac hx that she knows of, no N/V, dizziness, or lightheadedness.

## 2023-10-18 ENCOUNTER — Encounter: Payer: Self-pay | Admitting: Oncology

## 2023-10-18 LAB — TROPONIN I (HIGH SENSITIVITY): Troponin I (High Sensitivity): 4 ng/L (ref <18)

## 2023-10-18 MED ORDER — SUCRALFATE 1 G PO TABS: 1.0000 g | ORAL_TABLET | Freq: Four times a day (QID) | ORAL | 0 refills | Status: AC | PRN

## 2023-10-18 MED ORDER — ONDANSETRON 4 MG PO TBDP
4.0000 mg | ORAL_TABLET | Freq: Once | ORAL | Status: AC
  Administered 2023-10-18: 4 mg via ORAL
  Filled 2023-10-18: qty 1

## 2023-10-18 NOTE — Discharge Instructions (Addendum)
 You were evaluated in the Emergency Department and after careful evaluation, we did not find any emergent condition requiring admission or further testing in the hospital.  Your exam/testing today is overall reassuring.  Recommend Tylenol  at home for discomfort.  Can also try the Carafate  prescribed to see if this helps the pain.  Recommend follow-up with cardiology as well as your primary care doctor.  Please return to the Emergency Department if you experience any worsening of your condition.   Thank you for allowing us  to be a part of your care.

## 2023-10-18 NOTE — ED Provider Notes (Signed)
 [facility] Emergency Department Provider Note MRN:  [id number]  Arrival date & time: 10/18/23     Chief Complaint   Chest Pain   History of Present Illness   [name] is a 53 y.o. year-old female with a history of breast cancer, diabetes presenting to the ED with chief complaint of chest pain.  Pain in the center of the chest described as a pressure-like pain, moves or changes with certain positions.  Denies dizziness or diaphoresis, no nausea or vomiting, no trouble breathing, no leg pain or swelling.  Review of Systems  A thorough review of systems was obtained and all systems are negative except as noted in the HPI and PMH.   Patient's Health History    Past Medical History:  Diagnosis Date   Allergy    Blood clot in vein    nonocclusive clot right subclavian vein, on Xarelto  for 6 months   Breast cancer (HCC)    T2N2 tiple negative left breast ca    Breast cancer (HCC)    Breast mass in female    fibrocystic changes   Cataracts, bilateral    Diabetes mellitus    insulin  dependant - uses pump, Dr. Von   Family history of breast cancer    Family history of prostate cancer    History of chemotherapy    taxotere /cytoxan  with neulasta  support   Hyperlipidemia    Hypertension    Lymphedema of arm    left upper extremity   Nausea    Nausea & vomiting    for approx. 2 weeks   Personal history of chemotherapy    Personal history of radiation therapy    Radiation 08/21/11-10/05/11   Left chestwll/Supraclav./left PAB/left scar   S/P chemotherapy, time since 4-12 weeks     Past Surgical History:  Procedure Laterality Date   ABDOMINAL HYSTERECTOMY  01/31/2000   still has ovaries    bladder tacking  01/30/2009   left mastectomy  02/21/2011   MASS EXCISION  02/19/2012   Procedure: MINOR EXCISION OF MASS;  Surgeon: Elon CHRISTELLA Pacini, MD;  Location: [facility];  Service: General;  Laterality: Left;   MASTECTOMY MODIFIED  RADICAL  02/21/2011   left,invasive  multifocal grade III ductal ca,high grade dcis,lymph/vascular invasion invasion (7/14) nodes positive mets   PORT-A-CATH REMOVAL  10/13/2011   Procedure: MINOR REMOVAL PORT-A-CATH;  Surgeon: Elon CHRISTELLA Pacini, MD;  Location:  SURGERY CENTER;  Service: General;  Laterality: Right;  PORT-A CATH REMOVAL   PORTACATH PLACEMENT  02/21/2011   Procedure: INSERTION PORT-A-CATH;  Surgeon: Elon CHRISTELLA Pacini, MD;  Location: [facility];  Service: General;  Laterality: N/A;   REDUCTION MAMMAPLASTY     SHOULDER SURGERY  04/26/2010   left   TUBAL LIGATION  12/31/1999    Family History  Problem Relation Age of Onset   Cancer Maternal Aunt        breast   Breast cancer Maternal Aunt    Cancer Maternal Uncle 50        right eye removed s/p cancer   Hypertension Other    Diabetes Other    Deep vein thrombosis Other    Kidney disease Other    Cancer Maternal Grandfather        prostate ca    Social History   Socioeconomic History   Marital status: Married    Spouse name: Not on file   Number of children: Not on file   Years of education:  Not on file   Highest education level: Not on file  Occupational History   Not on file  Tobacco Use   Smoking status: Never   Smokeless tobacco: Never  Substance and Sexual Activity   Alcohol use: No   Drug use: No   Sexual activity: Yes    Comment: menses age 1, 68st preganancy age 70, G58p3  Other Topics Concern   Not on file  Social History Narrative   Not on file   Social Drivers of Health   Financial Resource Strain: Low Risk  (06/23/2021)   Received from [facility]   Overall Financial Resource Strain (CARDIA)    Difficulty of Paying Living Expenses: Not hard at all  Food Insecurity: No Food Insecurity (09/06/2023)   Hunger Vital Sign    Worried About Running Out of Food in the Last Year: Never true    Ran Out of Food in the Last Year: Never true  Transportation Needs: No Transportation Needs  (09/06/2023)   PRAPARE - Administrator, Civil Service (Medical): No    Lack of Transportation (Non-Medical): No  Physical Activity: Not on file  Stress: Not on file  Social Connections: Not on file  Intimate Partner Violence: Not At Risk (09/06/2023)   Humiliation, Afraid, Rape, and Kick questionnaire    Fear of Current or Ex-Partner: No    Emotionally Abused: No    Physically Abused: No    Sexually Abused: No     Physical Exam   Vitals:   10/18/23 0155 10/18/23 0159  BP: (!) 142/94   Pulse: 80   Resp: 18   Temp:  98.6 F (37 C)  SpO2: 100%     CONSTITUTIONAL: Well-appearing, NAD NEURO/PSYCH:  Alert and oriented x 3, no focal deficits EYES:  eyes equal and reactive ENT/NECK:  no LAD, no JVD CARDIO: Regular rate, well-perfused, normal S1 and S2 PULM:  CTAB no wheezing or rhonchi GI/GU:  non-distended, non-tender MSK/SPINE:  No gross deformities, no edema SKIN:  no rash, atraumatic   *Additional and/or pertinent findings included in MDM below  Diagnostic and Interventional Summary    EKG Interpretation Date/Time:  Wednesday October 17 2023 21:41:16 EDT Ventricular Rate:  84 PR Interval:  148 QRS Duration:  74 QT Interval:  350 QTC Calculation: 413 R Axis:   36  Text Interpretation: Normal sinus rhythm Normal ECG When compared with ECG of 06-Sep-2023 08:09, PREVIOUS ECG IS PRESENT Confirmed by Theadore Sharper [phone]) on 10/18/2023 2:11:36 AM       Labs Reviewed  BASIC METABOLIC PANEL WITH GFR - Abnormal; Notable for the following components:      Result Value   Glucose, Bld 266 (*)    All other components within normal limits  CBC  TROPONIN I (HIGH SENSITIVITY)  TROPONIN I (HIGH SENSITIVITY)    DG Chest 2 View  Final Result      Medications - No data to display   Procedures  /  Critical Care Procedures  ED Course and Medical Decision Making  Initial Impression and Ddx Patient has a history of hypertension and diabetes as her  cardiovascular risk factors.  And so ACS is considered but the pain is a bit atypical, MSK is favored.  Patient has a remote history of breast cancer years ago.  The pain is not pleuritic, she has no shortness of breath, no tachycardia, no tachypnea, no evidence of DVT on exam, overall highly doubt PE.  Past medical/surgical history that increases  complexity of ED encounter: Remote history of breast cancer, diabetes, hypertension  Interpretation of Diagnostics I personally reviewed the EKG and my interpretation is as follows: Sinus rhythm without concerning ischemic findings  No significant blood count or electrolyte disturbance.  Troponin negative x 2  Patient Reassessment and Ultimate Disposition/Management     Heart score is 3, appropriate for discharge, referred to cardiology.  Patient management required discussion with the following services or consulting groups:  None  Complexity of Problems Addressed Acute illness or injury that poses threat of life of bodily function  Additional Data Reviewed and Analyzed Further history obtained from: Further history from spouse/family member and Prior labs/imaging results  Additional Factors Impacting ED Encounter Risk Consideration of hospitalization  Ozell HERO. Theadore, MD [facility] Emergency Medicine [facility] [email or web address]  Final Clinical Impressions(s) / ED Diagnoses     ICD-10-CM   1. Chest pain, unspecified type  R07.9 Ambulatory referral to Cardiology      ED Discharge Orders          Ordered    Ambulatory referral to Cardiology        10/18/23 0256    sucralfate  (CARAFATE ) 1 g tablet  4 times daily PRN        10/18/23 0257             Discharge Instructions Discussed with and Provided to Patient:    Discharge Instructions      You were evaluated in the Emergency Department and after careful evaluation, we did not find any emergent condition requiring admission or further testing in  the hospital.  Your exam/testing today is overall reassuring.  Recommend Tylenol  at home for discomfort.  Can also try the Carafate  prescribed to see if this helps the pain.  Recommend follow-up with cardiology as well as your primary care doctor.  Please return to the Emergency Department if you experience any worsening of your condition.   Thank you for allowing us  to be a part of your care.      Theadore Ozell HERO, MD 10/18/23 0300

## 2023-10-19 ENCOUNTER — Ambulatory Visit
Attending: Student in an Organized Health Care Education/Training Program | Admitting: Student in an Organized Health Care Education/Training Program

## 2023-10-19 ENCOUNTER — Encounter: Payer: Self-pay | Admitting: Oncology

## 2023-10-19 ENCOUNTER — Encounter: Payer: Self-pay | Admitting: Student in an Organized Health Care Education/Training Program

## 2023-10-19 ENCOUNTER — Other Ambulatory Visit (HOSPITAL_COMMUNITY): Payer: Self-pay

## 2023-10-19 VITALS — BP 126/78 | HR 93 | Ht 65.0 in | Wt 153.8 lb

## 2023-10-19 DIAGNOSIS — R072 Precordial pain: Secondary | ICD-10-CM | POA: Diagnosis not present

## 2023-10-19 DIAGNOSIS — E782 Mixed hyperlipidemia: Secondary | ICD-10-CM | POA: Diagnosis not present

## 2023-10-19 MED ORDER — ROSUVASTATIN CALCIUM 20 MG PO TABS
20.0000 mg | ORAL_TABLET | Freq: Every day | ORAL | 3 refills | Status: AC
  Filled 2023-10-19: qty 90, 90d supply, fill #0

## 2023-10-19 MED ORDER — METOPROLOL TARTRATE 100 MG PO TABS
100.0000 mg | ORAL_TABLET | Freq: Once | ORAL | 0 refills | Status: AC
  Filled 2023-10-19: qty 1, 1d supply, fill #0

## 2023-10-19 NOTE — Progress Notes (Signed)
 Cardiology Office Note:   Date:  10/19/2023  ID:  Jerrald GORMAN [place], DOB [birth year], MRN [id number] PCP: Auston Opal, DO  [facility] Providers Cardiologist:  Georganna Archer, MD { Chief Complaint:  Chief Complaint  Patient presents with   Chest Pain      History of Present Illness:   [name] is a 53 y.o. female with a PMH of DM1, HTN, HLD obesity, and breast CA who presents as a new patient referral by Dr. Ozell Poisson for the evaluation of chest pain.  The patient reports having an episode of chest pain on 9/17 that prompted her to present to the [facility], ED.  She was driving to church when she developed sudden onset central chest pressure that radiated to her right arm into her back.  No associated diaphoresis, nausea, presyncope, syncope, or palpitations.  She did feel very fatigued however.  The chest pain waxed and waned over the course of the day and was moderate in intensity.  It lasted for >24 hrs. several hours prior she ate a vegetable wrap, but denies eating any heavy or spicy foods.  She has never had chest pain before, but she denies being active.  She does not formally exercise and extent of her physical activity is walking up and down steps at her home.  She informed her husband and pastor of her symptoms and they encouraged her to present to the ED for evaluation.  She presented to the ED on 9/17.  Troponins and laboratory testing were WNL.  CXR and ECG were unremarkable.  She was discharged home with cardiology referral.  She denies tobacco, alcohol, and illicit drug use.  Her family history is notable for her father who has CAD, paternal grandfather who died of an MI, and paternal grandmother who died of an MI.  She lives in [place] with her husband and has 3 adult children.  Lastly, she reports missing many doses of her atorvastatin  because the causes leg cramping.  She takes her lisinopril  fairly regularly.   Past Medical History:  Diagnosis Date    Allergy    Blood clot in vein    nonocclusive clot right subclavian vein, on Xarelto  for 6 months   Breast cancer (HCC)    T2N2 tiple negative left breast ca    Breast cancer (HCC)    Breast mass in female    fibrocystic changes   Cataracts, bilateral    Diabetes mellitus    insulin  dependant - uses pump, Dr. Von   Family history of breast cancer    Family history of prostate cancer    History of chemotherapy    taxotere /cytoxan  with neulasta  support   Hyperlipidemia    Hypertension    Lymphedema of arm    left upper extremity   Nausea    Nausea & vomiting    for approx. 2 weeks   Personal history of chemotherapy    Personal history of radiation therapy    Radiation 08/21/11-10/05/11   Left chestwll/Supraclav./left PAB/left scar   S/P chemotherapy, time since 4-12 weeks      Studies Reviewed:    EKG: I independently reviewed her EKG from yesterday and it showed NSR.  No ischemic changes.           Risk Assessment/Calculations:              Physical Exam:     VS:  BP 126/78   Pulse 93   Ht 5' 5 (1.651 m)  Wt 153 lb 12.8 oz (69.8 kg)   SpO2 97%   BMI 25.59 kg/m      Wt Readings from Last 3 Encounters:  10/17/23 200 lb 9.9 oz (91 kg)  12/25/18 200 lb (90.7 kg)  12/13/18 198 lb (89.8 kg)     GEN: Well nourished, well developed, in no acute distress NECK: No JVD; No carotid bruits CARDIAC: RRR, no murmurs, rubs, gallops RESPIRATORY:  Clear to auscultation without rales, wheezing or rhonchi  ABDOMEN: Soft, non-tender, non-distended, normal bowel sounds EXTREMITIES:  Warm and well perfused, no edema; No deformity, 2+ radial pulses PSYCH: Normal mood and affect   Assessment & Plan Precordial chest pain Patient presenting with a recent episode of chest pressure.  Currently chest pain-free.  She has high risk for having CAD with longstanding type 1 diabetes, a positive family history, and HLD.  Is unclear at this time whether or not her chest pain episode  was cardiac in etiology versus something else, but she warrants an ischemic evaluation.  I will pursue a coronary CTA. -Coronary CT angiogram -Follow-up in 6 months  Mixed hyperlipidemia Her LDL at her PCP 2 weeks ago was 103 which is not at goal.  I would like her LDL to be at least less than 70.  However, she endorses some intolerance to her statin.  She is agreeable to try different statin and lets see if she tolerates this.  If not we will need to consider PCSK9 inhibitor. -Stop atorvastatin  due to leg cramping - Start Crestor  20 mg daily - Repeat lipid panel and lipoprotein a in 3 months      This note was written with the assistance of a dictation microphone or AI dictation software. Please excuse any typos or grammatical errors.   Signed, Georganna Archer, MD 10/19/2023 1:28 PM    [facility]

## 2023-10-19 NOTE — Assessment & Plan Note (Signed)
 Her LDL at her PCP 2 weeks ago was 103 which is not at goal.  I would like her LDL to be at least less than 70.  However, she endorses some intolerance to her statin.  She is agreeable to try different statin and lets see if she tolerates this.  If not we will need to consider PCSK9 inhibitor. -Stop atorvastatin  due to leg cramping - Start Crestor  20 mg daily - Repeat lipid panel and lipoprotein a in 3 months

## 2023-10-19 NOTE — Patient Instructions (Addendum)
 Medication Instructions:  STOP Lipitor   START Crestor  20 mg daily  ONE TIME DOSE FOR CORONARY CTA: metoprolol  tartrate (Lopressor ):  Take 1 tablet (100 mg total) by mouth once for 1 dose. Take 90-120 minutes prior to scan. Hold for SBP less than 110   *If you need a refill on your cardiac medications before your next appointment, please call your pharmacy*  Lab Work in 3 months: Lipid Panel Lpa If you have labs (blood work) drawn today and your tests are completely normal, you will receive your results only by: MyChart Message (if you have MyChart) OR A paper copy in the mail If you have any lab test that is abnormal or we need to change your treatment, we will call you to review the results.  Testing/Procedures: Coronary CTA Your physician has requested that you have cardiac CT. Cardiac computed tomography (CT) is a painless test that uses an x-ray machine to take clear, detailed pictures of your heart. For further information please visit [email or web address] Please follow instruction sheet as given.    Follow-Up: At [facility], you and your health needs are our priority.  As part of our continuing mission to provide you with exceptional heart care, our providers are all part of one team.  This team includes your primary Cardiologist (physician) and Advanced Practice Providers or APPs (Physician Assistants and Nurse Practitioners) who all work together to provide you with the care you need, when you need it.  Your next appointment:   6 months   Provider:   Georganna Archer, MD    We recommend signing up for the patient portal called MyChart.  Sign up information is provided on this After Visit Summary.  MyChart is used to connect with patients for Virtual Visits (Telemedicine).  Patients are able to view lab/test results, encounter notes, upcoming appointments, etc.  Non-urgent messages can be sent to your provider as well.   To learn more about what you can do with  MyChart, go to [email or web address].   Other Instructions   Your cardiac CT will be scheduled at one of the below locations:   [facility] [address]  [place], [place] [address] [phone]   If scheduled at the [facility] V[facility] at [facility] [address], please enter the parking lot using the [address] entrance and use the FREE valet service at the patient drop-off area. Enter the building and check-in with registration on the main floor.  Please follow these instructions carefully (unless otherwise directed):  An IV will be required for this test and Nitroglycerin will be given.  Hold all erectile dysfunction medications at least 3 days (72 hrs) prior to test. (Ie viagra, cialis, sildenafil, tadalafil, etc)   On the Night Before the Test: Be sure to Drink plenty of water. Do not consume any caffeinated/decaffeinated beverages or chocolate 12 hours prior to your test. Do not take any antihistamines 12 hours prior to your test.  On the Day of the Test: Drink plenty of water until 1 hour prior to the test. Do not eat any food 1 hour prior to test. You may take your regular medications prior to the test.  Take metoprolol  (Lopressor ) 100 mg two hours prior to test. If you take Furosemide /Hydrochlorothiazide/Spironolactone/Chlorthalidone, please HOLD on the morning of the test. Patients who wear a continuous glucose monitor MUST remove the device prior to scanning. FEMALES- please wear underwire-free bra if available, avoid dresses & tight clothing  After the Test: Drink plenty of water. After receiving IV contrast, you may experience a mild flushed feeling. This is normal. On occasion, you may experience a mild rash up to 24 hours after the test. This is not dangerous. If this occurs, you can take Benadryl  25 mg, Zyrtec , Claritin, or Allegra and increase your fluid intake. (Patients taking Tikosyn should avoid Benadryl , and may take  Zyrtec , Claritin, or Allegra) If you experience trouble breathing, this can be serious. If it is severe call 911 IMMEDIATELY. If it is mild, please call our office.  We will call to schedule your test 2-4 weeks out understanding that some insurance companies will need an authorization prior to the service being performed.   For more information and frequently asked questions, please visit our website : [email or web address]  For non-scheduling related questions, please contact the cardiac imaging nurse navigator should you have any questions/concerns: Cardiac Imaging Nurse Navigators Direct Office Dial: [phone]   For scheduling needs, including cancellations and rescheduling, please call [place], [phone].

## 2023-10-22 ENCOUNTER — Encounter: Payer: Self-pay | Admitting: Oncology

## 2023-10-23 ENCOUNTER — Encounter (HOSPITAL_COMMUNITY): Payer: Self-pay

## 2023-10-24 DIAGNOSIS — Z794 Long term (current) use of insulin: Secondary | ICD-10-CM | POA: Diagnosis not present

## 2023-10-24 DIAGNOSIS — Z8639 Personal history of other endocrine, nutritional and metabolic disease: Secondary | ICD-10-CM | POA: Diagnosis not present

## 2023-10-24 DIAGNOSIS — E065 Other chronic thyroiditis: Secondary | ICD-10-CM | POA: Diagnosis not present

## 2023-10-24 DIAGNOSIS — E1065 Type 1 diabetes mellitus with hyperglycemia: Secondary | ICD-10-CM | POA: Diagnosis not present

## 2023-10-25 ENCOUNTER — Ambulatory Visit (HOSPITAL_COMMUNITY)
Admission: RE | Admit: 2023-10-25 | Discharge: 2023-10-25 | Disposition: A | Discharge status: Home / Self Care | Source: Ambulatory Visit | Attending: Student in an Organized Health Care Education/Training Program | Admitting: Student in an Organized Health Care Education/Training Program

## 2023-10-25 ENCOUNTER — Encounter: Payer: Self-pay | Admitting: Oncology

## 2023-10-25 DIAGNOSIS — I2510 Atherosclerotic heart disease of native coronary artery without angina pectoris: Secondary | ICD-10-CM | POA: Diagnosis not present

## 2023-10-25 DIAGNOSIS — R072 Precordial pain: Secondary | ICD-10-CM | POA: Diagnosis present

## 2023-10-25 DIAGNOSIS — Q2112 Patent foramen ovale: Secondary | ICD-10-CM | POA: Insufficient documentation

## 2023-10-25 MED ORDER — IOHEXOL 350 MG/ML SOLN
100.0000 mL | Freq: Once | INTRAVENOUS | Status: AC | PRN
Start: 2023-10-25 — End: 2023-10-25
  Administered 2023-10-25: 100 mL via INTRAVENOUS

## 2023-10-25 MED ORDER — NITROGLYCERIN 0.4 MG SL SUBL
0.8000 mg | SUBLINGUAL_TABLET | Freq: Once | SUBLINGUAL | Status: AC
  Administered 2023-10-25: 0.8 mg via SUBLINGUAL

## 2023-10-28 ENCOUNTER — Ambulatory Visit: Payer: Self-pay | Admitting: Student in an Organized Health Care Education/Training Program

## 2024-02-15 ENCOUNTER — Encounter (HOSPITAL_COMMUNITY): Payer: Self-pay

## 2024-02-15 ENCOUNTER — Other Ambulatory Visit (HOSPITAL_COMMUNITY): Payer: Self-pay
